# Patient Record
Sex: Female | Born: 1975 | Race: White | Hispanic: No | Marital: Married | Smoking: Never smoker
Health system: Southern US, Community
[De-identification: ages and names within clinical notes are randomized; demographics above are authoritative.]

## PROBLEM LIST (undated history)

## (undated) DIAGNOSIS — D649 Anemia, unspecified: Secondary | ICD-10-CM

## (undated) DIAGNOSIS — K829 Disease of gallbladder, unspecified: Secondary | ICD-10-CM

## (undated) DIAGNOSIS — R7989 Other specified abnormal findings of blood chemistry: Secondary | ICD-10-CM

## (undated) DIAGNOSIS — R12 Heartburn: Secondary | ICD-10-CM

## (undated) DIAGNOSIS — G08 Intracranial and intraspinal phlebitis and thrombophlebitis: Secondary | ICD-10-CM

## (undated) DIAGNOSIS — M7989 Other specified soft tissue disorders: Secondary | ICD-10-CM

## (undated) DIAGNOSIS — K3184 Gastroparesis: Secondary | ICD-10-CM

## (undated) DIAGNOSIS — Z1371 Encounter for nonprocreative screening for genetic disease carrier status: Secondary | ICD-10-CM

## (undated) DIAGNOSIS — R7303 Prediabetes: Secondary | ICD-10-CM

## (undated) DIAGNOSIS — E559 Vitamin D deficiency, unspecified: Secondary | ICD-10-CM

## (undated) DIAGNOSIS — I639 Cerebral infarction, unspecified: Secondary | ICD-10-CM

## (undated) DIAGNOSIS — K311 Adult hypertrophic pyloric stenosis: Secondary | ICD-10-CM

## (undated) DIAGNOSIS — K449 Diaphragmatic hernia without obstruction or gangrene: Secondary | ICD-10-CM

## (undated) DIAGNOSIS — K313 Pylorospasm, not elsewhere classified: Secondary | ICD-10-CM

## (undated) DIAGNOSIS — E282 Polycystic ovarian syndrome: Secondary | ICD-10-CM

## (undated) DIAGNOSIS — M2550 Pain in unspecified joint: Secondary | ICD-10-CM

## (undated) DIAGNOSIS — M1990 Unspecified osteoarthritis, unspecified site: Secondary | ICD-10-CM

## (undated) DIAGNOSIS — G4733 Obstructive sleep apnea (adult) (pediatric): Secondary | ICD-10-CM

## (undated) DIAGNOSIS — I10 Essential (primary) hypertension: Secondary | ICD-10-CM

## (undated) DIAGNOSIS — E785 Hyperlipidemia, unspecified: Secondary | ICD-10-CM

## (undated) DIAGNOSIS — G932 Benign intracranial hypertension: Secondary | ICD-10-CM

## (undated) DIAGNOSIS — Z86718 Personal history of other venous thrombosis and embolism: Secondary | ICD-10-CM

## (undated) HISTORY — DX: Heartburn: R12

## (undated) HISTORY — PX: RIGHT OOPHORECTOMY: SHX2359

## (undated) HISTORY — PX: TYMPANOSTOMY TUBE PLACEMENT: SHX32

## (undated) HISTORY — PX: SALPINGECTOMY: SHX328

## (undated) HISTORY — DX: Cerebral infarction, unspecified: I63.9

## (undated) HISTORY — DX: Vitamin D deficiency, unspecified: E55.9

## (undated) HISTORY — PX: WISDOM TOOTH EXTRACTION: SHX21

## (undated) HISTORY — DX: Hyperlipidemia, unspecified: E78.5

## (undated) HISTORY — DX: Pylorospasm, not elsewhere classified: K31.3

## (undated) HISTORY — DX: Benign intracranial hypertension: G93.2

## (undated) HISTORY — DX: Other specified abnormal findings of blood chemistry: R79.89

## (undated) HISTORY — DX: Disease of gallbladder, unspecified: K82.9

## (undated) HISTORY — DX: Adult hypertrophic pyloric stenosis: K31.1

## (undated) HISTORY — DX: Prediabetes: R73.03

## (undated) HISTORY — DX: Unspecified osteoarthritis, unspecified site: M19.90

## (undated) HISTORY — PX: PYLOROPLASTY: SHX418

## (undated) HISTORY — PX: APPENDECTOMY: SHX54

## (undated) HISTORY — DX: Personal history of other venous thrombosis and embolism: Z86.718

## (undated) HISTORY — DX: Anemia, unspecified: D64.9

## (undated) HISTORY — DX: Obstructive sleep apnea (adult) (pediatric): G47.33

## (undated) HISTORY — DX: Gastroparesis: K31.84

## (undated) HISTORY — DX: Pain in unspecified joint: M25.50

## (undated) HISTORY — PX: KNEE ARTHROSCOPY: SHX127

## (undated) HISTORY — DX: Essential (primary) hypertension: I10

## (undated) HISTORY — PX: COLON SURGERY: SHX602

## (undated) HISTORY — DX: Other specified soft tissue disorders: M79.89

## (undated) HISTORY — DX: Diaphragmatic hernia without obstruction or gangrene: K44.9

## (undated) HISTORY — PX: BREAST BIOPSY: SHX20

---

## 1999-09-25 ENCOUNTER — Other Ambulatory Visit: Admission: RE | Admit: 1999-09-25 | Discharge: 1999-09-25 | Payer: Self-pay | Admitting: Obstetrics and Gynecology

## 2000-04-26 DIAGNOSIS — G08 Intracranial and intraspinal phlebitis and thrombophlebitis: Secondary | ICD-10-CM

## 2000-04-26 DIAGNOSIS — I639 Cerebral infarction, unspecified: Secondary | ICD-10-CM

## 2000-04-26 HISTORY — DX: Intracranial and intraspinal phlebitis and thrombophlebitis: G08

## 2000-04-26 HISTORY — DX: Cerebral infarction, unspecified: I63.9

## 2000-10-19 ENCOUNTER — Other Ambulatory Visit: Admission: RE | Admit: 2000-10-19 | Discharge: 2000-10-19 | Payer: Self-pay | Admitting: Obstetrics and Gynecology

## 2001-06-25 ENCOUNTER — Inpatient Hospital Stay (HOSPITAL_COMMUNITY): Admission: EM | Admit: 2001-06-25 | Discharge: 2001-07-05 | Payer: Self-pay | Admitting: Neurosurgery

## 2001-06-25 ENCOUNTER — Encounter: Payer: Self-pay | Admitting: Neurosurgery

## 2001-06-25 ENCOUNTER — Encounter: Payer: Self-pay | Admitting: Emergency Medicine

## 2001-06-26 ENCOUNTER — Encounter: Payer: Self-pay | Admitting: Neurosurgery

## 2001-06-27 ENCOUNTER — Encounter: Payer: Self-pay | Admitting: Neurosurgery

## 2001-06-29 ENCOUNTER — Encounter: Payer: Self-pay | Admitting: Neurosurgery

## 2001-07-03 ENCOUNTER — Encounter: Payer: Self-pay | Admitting: Neurosurgery

## 2001-07-06 ENCOUNTER — Emergency Department (HOSPITAL_COMMUNITY): Admission: EM | Admit: 2001-07-06 | Discharge: 2001-07-06 | Payer: Self-pay | Admitting: *Deleted

## 2001-07-06 ENCOUNTER — Encounter: Payer: Self-pay | Admitting: Neurosurgery

## 2001-10-20 ENCOUNTER — Other Ambulatory Visit: Admission: RE | Admit: 2001-10-20 | Discharge: 2001-10-20 | Payer: Self-pay | Admitting: Obstetrics and Gynecology

## 2001-10-22 ENCOUNTER — Inpatient Hospital Stay (HOSPITAL_COMMUNITY): Admission: AD | Admit: 2001-10-22 | Discharge: 2001-10-22 | Payer: Self-pay | Admitting: Obstetrics and Gynecology

## 2001-10-23 ENCOUNTER — Inpatient Hospital Stay (HOSPITAL_COMMUNITY): Admission: AD | Admit: 2001-10-23 | Discharge: 2001-10-23 | Payer: Self-pay | Admitting: Obstetrics and Gynecology

## 2001-11-10 ENCOUNTER — Encounter: Payer: Self-pay | Admitting: Neurosurgery

## 2001-11-11 ENCOUNTER — Ambulatory Visit (HOSPITAL_COMMUNITY): Admission: RE | Admit: 2001-11-11 | Discharge: 2001-11-11 | Payer: Self-pay | Admitting: Neurosurgery

## 2002-05-03 ENCOUNTER — Emergency Department (HOSPITAL_COMMUNITY): Admission: EM | Admit: 2002-05-03 | Discharge: 2002-05-04 | Payer: Self-pay | Admitting: Emergency Medicine

## 2002-09-05 ENCOUNTER — Ambulatory Visit (HOSPITAL_COMMUNITY): Admission: RE | Admit: 2002-09-05 | Discharge: 2002-09-05 | Payer: Self-pay | Admitting: Oncology

## 2002-09-05 ENCOUNTER — Encounter: Payer: Self-pay | Admitting: Oncology

## 2002-10-24 ENCOUNTER — Other Ambulatory Visit: Admission: RE | Admit: 2002-10-24 | Discharge: 2002-10-24 | Payer: Self-pay | Admitting: Obstetrics and Gynecology

## 2003-06-26 ENCOUNTER — Emergency Department (HOSPITAL_COMMUNITY): Admission: EM | Admit: 2003-06-26 | Discharge: 2003-06-27 | Payer: Self-pay | Admitting: Emergency Medicine

## 2003-10-31 ENCOUNTER — Other Ambulatory Visit: Admission: RE | Admit: 2003-10-31 | Discharge: 2003-10-31 | Payer: Self-pay | Admitting: Obstetrics and Gynecology

## 2004-03-17 ENCOUNTER — Ambulatory Visit: Payer: Self-pay | Admitting: Oncology

## 2004-05-25 ENCOUNTER — Ambulatory Visit: Payer: Self-pay | Admitting: Oncology

## 2004-07-23 ENCOUNTER — Other Ambulatory Visit: Admission: RE | Admit: 2004-07-23 | Discharge: 2004-07-23 | Payer: Self-pay | Admitting: Obstetrics and Gynecology

## 2004-07-27 ENCOUNTER — Ambulatory Visit: Payer: Self-pay | Admitting: Oncology

## 2004-09-14 ENCOUNTER — Ambulatory Visit: Payer: Self-pay | Admitting: Oncology

## 2004-11-09 ENCOUNTER — Ambulatory Visit: Payer: Self-pay | Admitting: Oncology

## 2004-12-07 ENCOUNTER — Ambulatory Visit: Admission: RE | Admit: 2004-12-07 | Discharge: 2004-12-07 | Payer: Self-pay | Admitting: Oncology

## 2004-12-08 ENCOUNTER — Inpatient Hospital Stay (HOSPITAL_COMMUNITY): Admission: AD | Admit: 2004-12-08 | Discharge: 2004-12-08 | Payer: Self-pay | Admitting: Obstetrics and Gynecology

## 2004-12-09 ENCOUNTER — Inpatient Hospital Stay (HOSPITAL_COMMUNITY): Admission: AD | Admit: 2004-12-09 | Discharge: 2004-12-09 | Payer: Self-pay | Admitting: Obstetrics and Gynecology

## 2004-12-18 ENCOUNTER — Inpatient Hospital Stay (HOSPITAL_COMMUNITY): Admission: AD | Admit: 2004-12-18 | Discharge: 2004-12-19 | Payer: Self-pay | Admitting: Obstetrics and Gynecology

## 2004-12-21 ENCOUNTER — Inpatient Hospital Stay (HOSPITAL_COMMUNITY): Admission: AD | Admit: 2004-12-21 | Discharge: 2004-12-21 | Payer: Self-pay | Admitting: Obstetrics and Gynecology

## 2004-12-24 ENCOUNTER — Inpatient Hospital Stay (HOSPITAL_COMMUNITY): Admission: AD | Admit: 2004-12-24 | Discharge: 2004-12-24 | Payer: Self-pay | Admitting: Obstetrics and Gynecology

## 2004-12-31 ENCOUNTER — Inpatient Hospital Stay (HOSPITAL_COMMUNITY): Admission: AD | Admit: 2004-12-31 | Discharge: 2005-01-01 | Payer: Self-pay | Admitting: Obstetrics and Gynecology

## 2005-01-01 ENCOUNTER — Inpatient Hospital Stay (HOSPITAL_COMMUNITY): Admission: AD | Admit: 2005-01-01 | Discharge: 2005-01-01 | Payer: Self-pay | Admitting: Obstetrics and Gynecology

## 2005-01-04 ENCOUNTER — Inpatient Hospital Stay (HOSPITAL_COMMUNITY): Admission: AD | Admit: 2005-01-04 | Discharge: 2005-01-06 | Payer: Self-pay | Admitting: Obstetrics and Gynecology

## 2005-01-10 ENCOUNTER — Inpatient Hospital Stay (HOSPITAL_COMMUNITY): Admission: AD | Admit: 2005-01-10 | Discharge: 2005-01-11 | Payer: Self-pay | Admitting: Obstetrics and Gynecology

## 2005-01-12 ENCOUNTER — Inpatient Hospital Stay (HOSPITAL_COMMUNITY): Admission: AD | Admit: 2005-01-12 | Discharge: 2005-01-15 | Payer: Self-pay | Admitting: Obstetrics and Gynecology

## 2005-01-18 ENCOUNTER — Ambulatory Visit: Payer: Self-pay | Admitting: Oncology

## 2005-01-24 ENCOUNTER — Inpatient Hospital Stay (HOSPITAL_COMMUNITY): Admission: AD | Admit: 2005-01-24 | Discharge: 2005-01-25 | Payer: Self-pay | Admitting: Obstetrics & Gynecology

## 2005-01-28 ENCOUNTER — Inpatient Hospital Stay (HOSPITAL_COMMUNITY): Admission: AD | Admit: 2005-01-28 | Discharge: 2005-01-31 | Payer: Self-pay | Admitting: Obstetrics and Gynecology

## 2005-03-10 ENCOUNTER — Ambulatory Visit: Payer: Self-pay | Admitting: Oncology

## 2005-03-12 ENCOUNTER — Other Ambulatory Visit: Admission: RE | Admit: 2005-03-12 | Discharge: 2005-03-12 | Payer: Self-pay | Admitting: Obstetrics and Gynecology

## 2008-01-24 ENCOUNTER — Encounter: Admission: RE | Admit: 2008-01-24 | Discharge: 2008-03-25 | Payer: Self-pay | Admitting: Family Medicine

## 2008-02-13 ENCOUNTER — Emergency Department (HOSPITAL_BASED_OUTPATIENT_CLINIC_OR_DEPARTMENT_OTHER): Admission: EM | Admit: 2008-02-13 | Discharge: 2008-02-13 | Payer: Self-pay | Admitting: Emergency Medicine

## 2008-04-05 ENCOUNTER — Encounter: Admission: RE | Admit: 2008-04-05 | Discharge: 2008-07-04 | Payer: Self-pay | Admitting: Family Medicine

## 2008-08-02 ENCOUNTER — Encounter: Admission: RE | Admit: 2008-08-02 | Discharge: 2008-10-31 | Payer: Self-pay | Admitting: Family Medicine

## 2008-11-04 ENCOUNTER — Encounter: Admission: RE | Admit: 2008-11-04 | Discharge: 2008-11-04 | Payer: Self-pay | Admitting: Family Medicine

## 2009-08-23 ENCOUNTER — Emergency Department (HOSPITAL_BASED_OUTPATIENT_CLINIC_OR_DEPARTMENT_OTHER): Admission: EM | Admit: 2009-08-23 | Discharge: 2009-08-23 | Payer: Self-pay | Admitting: Emergency Medicine

## 2010-05-16 ENCOUNTER — Encounter: Payer: Self-pay | Admitting: Obstetrics and Gynecology

## 2010-09-11 NOTE — H&P (Signed)
Shelley Ryan, Shelley Ryan                 ACCOUNT NO.:  [id number]   MEDICAL RECORD NO.:  [id number]          PATIENT TYPE:  INP   LOCATION:  9152                          FACILITY:  [facility]   PHYSICIAN:  Duke Salvia. Marcelle Overlie, M.D.DATE OF BIRTH:  1975-05-28   DATE OF ADMISSION:  01/12/2005  DATE OF DISCHARGE:                                HISTORY & PHYSICAL   CHIEF COMPLAINT:  Preterm labor.   HISTORY OF PRESENT ILLNESS:  She is a 35 year old G1, P0, EDD is October 23.  EGA is 35+ weeks but was noted on routine exam today in office to have  increased irritability.  She has had several episodes of admissions for PTL  but had declined terbutaline and magnesium.  She has currently been managed  as an outpatient with Procardia 20 mg p.o. either t.i.d. or q.i.d.  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 Kalman Drape  management with this pregnancy.  Decision made today to admit for  observation, hydration, continue her Procardia, allow the Lovenox to wear  off but consider amniocentesis for lung maturity followed by induction if  mature.   PAST MEDICAL HISTORY:  Blood type is A positive.  Rubella titer is immune.   ALLERGIES:  Numerous drug allergies as noted on her prenatal record.   PAST MEDICAL HISTORY:  1.  Significant for the above noted elevated factor 8.  2.  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   PAST SURGICAL HISTORY:  1.  She has had knee surgery.  2.  Appendectomy.  3.  History of VDS that will require SPE prophylaxis.   PHYSICAL EXAMINATION:  VITAL SIGNS:  She is afebrile, blood pressure 141/81.  HEENT:  Unremarkable.  NECK:  Supple without masses.  LUNGS:  Clear.  CARDIOVASCULAR:  Regular rate and rhythm without murmurs, rubs, or gallops.  BREASTS:  Not examined.  ABDOMEN:  36 cm fundal height.  Fetal heart rate 140  PELVIC:  Cervix was closed.  EXTREMITIES/NEUROLOGIC:  Unremarkable.   IMPRESSION:  1.  A 35-week intrauterine pregnancy.  2.  Mild preterm labor.  3.  History of factor 8 elevation.   PLAN:  Will admit for hydration.  Continue her Procardia.  Will allow 24-36  hours for her Lovenox to wear off and then schedule amniocentesis for lung  maturity followed by induction if mature.      Richard M. Marcelle Overlie, M.D.  Electronically Signed     RMH/MEDQ  D:  01/12/2005  T:  01/12/2005  Job:  [id number]

## 2010-09-11 NOTE — Discharge Summary (Signed)
Shelley Ryan, Shelley Ryan                 ACCOUNT NO.:  [id number]   MEDICAL RECORD NO.:  [id number]          PATIENT TYPE:  INP   LOCATION:  9156                          [facility]ILITY:  [facility]   PHYSICIAN:  Zelphia Cairo, MD    DATE OF BIRTH:  09/12/1975   DATE OF ADMISSION:  01/04/2005  DATE OF DISCHARGE:  01/06/2005                                 DISCHARGE SUMMARY   ADMITTING DIAGNOSES:  1.  Intrauterine pregnancy at 34-1/7th's weeks' estimated gestational age.  2.  Preterm labor.  3.  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  4.  History of cerebrovascular accident.  5.  __________  .   DISCHARGE DIAGNOSIS:  1.  Intrauterine pregnancy at 34-3/7 weeks' estimated gestational age.  2.  Preterm labor, arrested.  3.  Thrombophilia.   REASON FOR ADMISSION:  Please see written H&P.   HOSPITAL COURSE:  The patient is a 35 year old, white, married female  primigravida that was admitted to [facility] at 34-1/7th's  weeks' estimated gestational age with complaints of increasing uterine  irritability.  Pregnancy had been complicated by thrombophilia, currently on  Lovenox.  She had had a history of a cerebrovascular accident.  The patient  was currently on Procardia t.i.d.  The patient denied any leakage of  amniotic fluid or vaginal bleeding.  She also denied any headaches, CNS  symptoms, or epigastric pain. On admission, abdomen was soft without  epigastric tenderness. Cervix was noted to be closed and thick by exam her  admitting nurse.  Deep tendon reflexes 2+ without clonus.  Fetal heart tones  were reactive.  Uterine contractions were noted to be irregular.  The  patient was admitted for observation, oral fluids, bedrest, and continued  Procardia and Lovenox.  On the following morning with continued uterine  irritability was noted.  Vital signs were otherwise stable.  Fetal heart  tones in the 140s and cervix was examined and noted to be long and closed.  Ultrasound  was ordered with plans if stable possible discharge the following  morning.  On the following morning, the patient had had 4 to 5 contractions  over night.  She continued to deny vaginal bleeding or loss of amniotic  fluid.  The fetus was noted to have good fetal movement with good  variability.  Discussed with the patient regarding monitoring at home and  continue modified bedrest.  The patient was later discharge.   CONDITION ON DISCHARGE:  Stable.   DIET:  Regular as tolerated.   ACTIVITY:  Modified bedrest   FOLLOW UP:  Patient is to follow up in the office in one week for an OB  check.   She is to call for increase in uterine contractions, decreased in fetal  movement, loss of amniotic fluid, or vaginal bleeding.   DISCHARGE MEDICATIONS:  1.  Continue Lovenox 40 mg subcu q.h.s.  2.  Procardia 20 mg t.i.d.  3.  Prenatal vitamins one p.o. daily.      Julio Sicks, N.P.      Zelphia Cairo, MD  Electronically Signed    CC/MEDQ  D:  04/06/2005  T:  04/06/2005  Job:  [id number]

## 2011-05-24 ENCOUNTER — Other Ambulatory Visit: Payer: Self-pay | Admitting: Obstetrics and Gynecology

## 2011-05-24 ENCOUNTER — Ambulatory Visit
Admission: RE | Admit: 2011-05-24 | Discharge: 2011-05-24 | Disposition: A | Discharge status: Home / Self Care | Payer: BC Managed Care – PPO | Source: Ambulatory Visit | Attending: Obstetrics and Gynecology | Admitting: Obstetrics and Gynecology

## 2011-05-24 DIAGNOSIS — N6315 Unspecified lump in the right breast, overlapping quadrants: Secondary | ICD-10-CM

## 2011-05-28 ENCOUNTER — Telehealth: Payer: Self-pay | Admitting: *Deleted

## 2011-05-28 NOTE — Telephone Encounter (Signed)
Confirmed 05/31/11 genetics appt w/ pt. 

## 2011-05-31 ENCOUNTER — Ambulatory Visit: Payer: BC Managed Care – PPO

## 2011-05-31 NOTE — Progress Notes (Signed)
Patient seen for genetic counseling. Blood drawn for BRCA1/2 at [facility]. TAT 2 weeks. Offered patient referral for heme workup and/or adult genetic work-up due to a history of a stroke in her 83s. Patient declined at this time.

## 2011-06-14 ENCOUNTER — Telehealth: Payer: Self-pay | Admitting: Genetic Counselor

## 2011-07-14 ENCOUNTER — Encounter: Payer: Self-pay | Admitting: Oncology

## 2013-08-17 NOTE — Telephone Encounter (Signed)
Please see Visit Info comments 

## 2013-10-08 ENCOUNTER — Inpatient Hospital Stay (HOSPITAL_BASED_OUTPATIENT_CLINIC_OR_DEPARTMENT_OTHER)
Admission: EM | Admit: 2013-10-08 | Discharge: 2013-10-10 | DRG: 419 | Disposition: A | Discharge status: Home / Self Care | Payer: BC Managed Care – PPO | Attending: General Surgery | Admitting: General Surgery

## 2013-10-08 ENCOUNTER — Emergency Department (HOSPITAL_BASED_OUTPATIENT_CLINIC_OR_DEPARTMENT_OTHER): Payer: BC Managed Care – PPO

## 2013-10-08 ENCOUNTER — Encounter (HOSPITAL_BASED_OUTPATIENT_CLINIC_OR_DEPARTMENT_OTHER): Payer: Self-pay | Admitting: Emergency Medicine

## 2013-10-08 DIAGNOSIS — Z8673 Personal history of transient ischemic attack (TIA), and cerebral infarction without residual deficits: Secondary | ICD-10-CM

## 2013-10-08 DIAGNOSIS — Z885 Allergy status to narcotic agent status: Secondary | ICD-10-CM

## 2013-10-08 DIAGNOSIS — R1011 Right upper quadrant pain: Secondary | ICD-10-CM

## 2013-10-08 DIAGNOSIS — R609 Edema, unspecified: Secondary | ICD-10-CM | POA: Diagnosis present

## 2013-10-08 DIAGNOSIS — Z9049 Acquired absence of other specified parts of digestive tract: Secondary | ICD-10-CM

## 2013-10-08 DIAGNOSIS — E669 Obesity, unspecified: Secondary | ICD-10-CM | POA: Diagnosis present

## 2013-10-08 DIAGNOSIS — Z881 Allergy status to other antibiotic agents status: Secondary | ICD-10-CM

## 2013-10-08 DIAGNOSIS — Z6839 Body mass index (BMI) 39.0-39.9, adult: Secondary | ICD-10-CM

## 2013-10-08 DIAGNOSIS — R112 Nausea with vomiting, unspecified: Secondary | ICD-10-CM

## 2013-10-08 DIAGNOSIS — Z803 Family history of malignant neoplasm of breast: Secondary | ICD-10-CM

## 2013-10-08 DIAGNOSIS — K810 Acute cholecystitis: Secondary | ICD-10-CM

## 2013-10-08 DIAGNOSIS — Z6834 Body mass index (BMI) 34.0-34.9, adult: Secondary | ICD-10-CM

## 2013-10-08 DIAGNOSIS — E282 Polycystic ovarian syndrome: Secondary | ICD-10-CM | POA: Diagnosis present

## 2013-10-08 DIAGNOSIS — Z1371 Encounter for nonprocreative screening for genetic disease carrier status: Secondary | ICD-10-CM

## 2013-10-08 DIAGNOSIS — Z6833 Body mass index (BMI) 33.0-33.9, adult: Secondary | ICD-10-CM

## 2013-10-08 DIAGNOSIS — Z79899 Other long term (current) drug therapy: Secondary | ICD-10-CM

## 2013-10-08 DIAGNOSIS — K8000 Calculus of gallbladder with acute cholecystitis without obstruction: Secondary | ICD-10-CM | POA: Diagnosis present

## 2013-10-08 DIAGNOSIS — D72829 Elevated white blood cell count, unspecified: Secondary | ICD-10-CM | POA: Diagnosis present

## 2013-10-08 DIAGNOSIS — Z888 Allergy status to other drugs, medicaments and biological substances status: Secondary | ICD-10-CM

## 2013-10-08 HISTORY — DX: Polycystic ovarian syndrome: E28.2

## 2013-10-08 HISTORY — DX: Cerebral infarction, unspecified: I63.9

## 2013-10-08 HISTORY — DX: Intracranial and intraspinal phlebitis and thrombophlebitis: G08

## 2013-10-08 HISTORY — DX: Encounter for nonprocreative screening for genetic disease carrier status: Z13.71

## 2013-10-08 LAB — COMPREHENSIVE METABOLIC PANEL
ALT: 18 U/L (ref 0–35)
AST: 24 U/L (ref 0–37)
Albumin: 4.1 g/dL (ref 3.5–5.2)
Alkaline Phosphatase: 64 U/L (ref 39–117)
BILIRUBIN TOTAL: 0.6 mg/dL (ref 0.3–1.2)
BUN: 15 mg/dL (ref 6–23)
CO2: 22 mEq/L (ref 19–32)
CREATININE: 0.8 mg/dL (ref 0.50–1.10)
Calcium: 9.5 mg/dL (ref 8.4–10.5)
Chloride: 102 mEq/L (ref 96–112)
Glucose, Bld: 97 mg/dL (ref 70–99)
Potassium: 3.9 mEq/L (ref 3.7–5.3)
Sodium: 138 mEq/L (ref 137–147)
Total Protein: 7.5 g/dL (ref 6.0–8.3)

## 2013-10-08 LAB — CBC WITH DIFFERENTIAL/PLATELET
BASOS PCT: 0 % (ref 0–1)
Basophils Absolute: 0 10*3/uL (ref 0.0–0.1)
EOS ABS: 0 10*3/uL (ref 0.0–0.7)
Eosinophils Relative: 0 % (ref 0–5)
HCT: 40.1 % (ref 36.0–46.0)
HEMOGLOBIN: 13.8 g/dL (ref 12.0–15.0)
Lymphocytes Relative: 12 % (ref 12–46)
Lymphs Abs: 1.7 10*3/uL (ref 0.7–4.0)
MCH: 30.5 pg (ref 26.0–34.0)
MCHC: 34.4 g/dL (ref 30.0–36.0)
MCV: 88.7 fL (ref 78.0–100.0)
MONOS PCT: 5 % (ref 3–12)
Monocytes Absolute: 0.8 10*3/uL (ref 0.1–1.0)
NEUTROS ABS: 11.9 10*3/uL — AB (ref 1.7–7.7)
Neutrophils Relative %: 83 % — ABNORMAL HIGH (ref 43–77)
Platelets: 224 10*3/uL (ref 150–400)
RBC: 4.52 MIL/uL (ref 3.87–5.11)
RDW: 12.6 % (ref 11.5–15.5)
WBC: 14.4 10*3/uL — ABNORMAL HIGH (ref 4.0–10.5)

## 2013-10-08 LAB — URINALYSIS, ROUTINE W REFLEX MICROSCOPIC
Bilirubin Urine: NEGATIVE
Glucose, UA: NEGATIVE mg/dL
HGB URINE DIPSTICK: NEGATIVE
KETONES UR: NEGATIVE mg/dL
Leukocytes, UA: NEGATIVE
Nitrite: NEGATIVE
PROTEIN: NEGATIVE mg/dL
Specific Gravity, Urine: 1.009 (ref 1.005–1.030)
Urobilinogen, UA: 0.2 mg/dL (ref 0.0–1.0)
pH: 7 (ref 5.0–8.0)

## 2013-10-08 LAB — PREGNANCY, URINE: Preg Test, Ur: NEGATIVE

## 2013-10-08 LAB — LIPASE, BLOOD: LIPASE: 21 U/L (ref 11–59)

## 2013-10-08 MED ORDER — CEFTRIAXONE SODIUM 2 G IJ SOLR
2.0000 g | INTRAMUSCULAR | Status: DC
  Administered 2013-10-09: 2 g via INTRAVENOUS
  Filled 2013-10-08 (×2): qty 2

## 2013-10-08 MED ORDER — KCL IN DEXTROSE-NACL 40-5-0.45 MEQ/L-%-% IV SOLN
INTRAVENOUS | Status: DC
  Administered 2013-10-09 (×2): via INTRAVENOUS
  Filled 2013-10-08 (×3): qty 1000

## 2013-10-08 MED ORDER — ONDANSETRON HCL 4 MG/2ML IJ SOLN: 4.0000 mg | Freq: Four times a day (QID) | INTRAMUSCULAR | Status: DC | PRN

## 2013-10-08 MED ORDER — DIPHENHYDRAMINE HCL 12.5 MG/5ML PO ELIX: 12.5000 mg | ORAL_SOLUTION | Freq: Four times a day (QID) | ORAL | Status: DC | PRN

## 2013-10-08 MED ORDER — BISACODYL 10 MG RE SUPP: 10.0000 mg | Freq: Two times a day (BID) | RECTAL | Status: DC | PRN

## 2013-10-08 MED ORDER — LACTATED RINGERS IV BOLUS (SEPSIS): 1000.0000 mL | Freq: Three times a day (TID) | INTRAVENOUS | Status: DC | PRN

## 2013-10-08 MED ORDER — GI COCKTAIL ~~LOC~~
30.0000 mL | Freq: Once | ORAL | Status: AC
  Administered 2013-10-08: 30 mL via ORAL
  Filled 2013-10-08: qty 30

## 2013-10-08 MED ORDER — METRONIDAZOLE IN NACL 5-0.79 MG/ML-% IV SOLN
500.0000 mg | Freq: Four times a day (QID) | INTRAVENOUS | Status: DC
Start: 2013-10-08 — End: 2013-10-09
  Administered 2013-10-09 (×3): 500 mg via INTRAVENOUS
  Filled 2013-10-08 (×5): qty 100

## 2013-10-08 MED ORDER — MAGIC MOUTHWASH
15.0000 mL | Freq: Four times a day (QID) | ORAL | Status: DC | PRN
  Filled 2013-10-08: qty 15

## 2013-10-08 MED ORDER — TRAMADOL HCL 50 MG PO TABS: 50.0000 mg | ORAL_TABLET | Freq: Four times a day (QID) | ORAL | Status: DC | PRN

## 2013-10-08 MED ORDER — PROMETHAZINE HCL 25 MG/ML IJ SOLN
6.2500 mg | Freq: Four times a day (QID) | INTRAMUSCULAR | Status: DC | PRN
  Filled 2013-10-08: qty 1

## 2013-10-08 MED ORDER — CHLORHEXIDINE GLUCONATE 4 % EX LIQD
1.0000 "application " | Freq: Once | CUTANEOUS | Status: AC
  Administered 2013-10-09: 1 via TOPICAL
  Filled 2013-10-08 (×2): qty 15

## 2013-10-08 MED ORDER — MENTHOL 3 MG MT LOZG
1.0000 | LOZENGE | OROMUCOSAL | Status: DC | PRN
  Filled 2013-10-08: qty 9

## 2013-10-08 MED ORDER — ONDANSETRON 8 MG/NS 50 ML IVPB
8.0000 mg | Freq: Four times a day (QID) | INTRAVENOUS | Status: DC | PRN
Start: 2013-10-08 — End: 2013-10-09
  Filled 2013-10-08: qty 8

## 2013-10-08 MED ORDER — ONDANSETRON 4 MG PO TBDP
4.0000 mg | ORAL_TABLET | Freq: Four times a day (QID) | ORAL | Status: DC | PRN
  Filled 2013-10-08: qty 2

## 2013-10-08 MED ORDER — DIPHENHYDRAMINE HCL 50 MG/ML IJ SOLN: 12.5000 mg | Freq: Four times a day (QID) | INTRAMUSCULAR | Status: DC | PRN

## 2013-10-08 MED ORDER — LACTATED RINGERS IV BOLUS (SEPSIS)
1000.0000 mL | Freq: Once | INTRAVENOUS | Status: AC
  Administered 2013-10-08: 1000 mL via INTRAVENOUS

## 2013-10-08 MED ORDER — SACCHAROMYCES BOULARDII 250 MG PO CAPS
250.0000 mg | ORAL_CAPSULE | Freq: Two times a day (BID) | ORAL | Status: DC
  Administered 2013-10-08: 250 mg via ORAL
  Filled 2013-10-08 (×3): qty 1

## 2013-10-08 MED ORDER — CHLORHEXIDINE GLUCONATE 4 % EX LIQD
1.0000 "application " | Freq: Once | CUTANEOUS | Status: AC
  Administered 2013-10-08: 1 via TOPICAL
  Filled 2013-10-08: qty 15

## 2013-10-08 MED ORDER — PSYLLIUM 95 % PO PACK
1.0000 | PACK | Freq: Two times a day (BID) | ORAL | Status: DC
  Administered 2013-10-08: 1 via ORAL
  Filled 2013-10-08 (×3): qty 1

## 2013-10-08 MED ORDER — ACETAMINOPHEN 325 MG PO TABS: 650.0000 mg | ORAL_TABLET | Freq: Four times a day (QID) | ORAL | Status: DC | PRN

## 2013-10-08 MED ORDER — ADULT MULTIVITAMIN W/MINERALS CH
1.0000 | ORAL_TABLET | Freq: Every day | ORAL | Status: DC
  Administered 2013-10-08: 1 via ORAL
  Filled 2013-10-08 (×2): qty 1

## 2013-10-08 MED ORDER — PANTOPRAZOLE SODIUM 40 MG PO TBEC
40.0000 mg | DELAYED_RELEASE_TABLET | Freq: Every day | ORAL | Status: DC
  Filled 2013-10-08: qty 1

## 2013-10-08 MED ORDER — ONDANSETRON HCL 4 MG/2ML IJ SOLN
4.0000 mg | Freq: Once | INTRAMUSCULAR | Status: AC
  Administered 2013-10-08: 4 mg via INTRAVENOUS
  Filled 2013-10-08: qty 2

## 2013-10-08 MED ORDER — ACETAMINOPHEN 650 MG RE SUPP: 650.0000 mg | Freq: Four times a day (QID) | RECTAL | Status: DC | PRN

## 2013-10-08 MED ORDER — ALUM & MAG HYDROXIDE-SIMETH 200-200-20 MG/5ML PO SUSP: 30.0000 mL | Freq: Four times a day (QID) | ORAL | Status: DC | PRN

## 2013-10-08 MED ORDER — HEPARIN SODIUM (PORCINE) 5000 UNIT/ML IJ SOLN
5000.0000 [IU] | Freq: Three times a day (TID) | INTRAMUSCULAR | Status: DC
  Filled 2013-10-08 (×5): qty 1

## 2013-10-08 MED ORDER — LIP MEDEX EX OINT
1.0000 "application " | TOPICAL_OINTMENT | Freq: Two times a day (BID) | CUTANEOUS | Status: DC
  Administered 2013-10-08: 1 via TOPICAL
  Filled 2013-10-08: qty 7

## 2013-10-08 MED ORDER — PHENOL 1.4 % MT LIQD
2.0000 | OROMUCOSAL | Status: DC | PRN
  Filled 2013-10-08: qty 177

## 2013-10-08 MED ORDER — MORPHINE SULFATE 4 MG/ML IJ SOLN
4.0000 mg | Freq: Once | INTRAMUSCULAR | Status: AC
  Administered 2013-10-08: 4 mg via INTRAVENOUS
  Filled 2013-10-08: qty 1

## 2013-10-08 MED ORDER — ONDANSETRON HCL 4 MG PO TABS: 4.0000 mg | ORAL_TABLET | Freq: Four times a day (QID) | ORAL | Status: DC | PRN

## 2013-10-08 MED ORDER — HYDROMORPHONE HCL PF 1 MG/ML IJ SOLN: 0.5000 mg | INTRAMUSCULAR | Status: DC | PRN

## 2013-10-08 NOTE — ED Notes (Signed)
Bed: WA26 Expected date:  Expected time:  Means of arrival:  Comments: TCU 

## 2013-10-08 NOTE — ED Provider Notes (Signed)
7:46 PM  Pt is a 38 y.o. F with history of prior CVA who presents the emergency department with right upper quadrant abdominal pain. Ultrasound showed acute cholecystitis. Patient sent here to see surgery. She states she is comfortable only complaining of nausea at this time. Her pain is a 3/10. Surgery has been page. Patient is n.p.o. Hemodynamically stable.  Layla MawKristen N Hisako Bugh, DO 10/08/13 1947

## 2013-10-08 NOTE — H&P (Signed)
[place], MD, [address] [place], [place] [address] Phone: [phone] FAX: [place]  03-27-1976 [id number]  CARE TEAM:  PCP: Tawanna Solo, MD  Outpatient Care Team: Patient Care Team: [name], MD as PCP - General (Family Medicine)  Inpatient Treatment Team: Treatment Team: Attending Provider: Nolon Nations, MD; Registered Nurse: Susette Racer, RN; Technician: Sharma Covert, EMT; Consulting Physician: Nolon Nations, MD; Registered Nurse: Roosvelt Harps, RN  This patient is a 38 y.o.female who presents today for surgical evaluation at the request of Thayer Jew, [facility] ED.   Reason for evaluation: RUQ pain, N/V, probable cholecystitis  Pleasant obese active female.  She comes today with her husband.  History of mild reflux in the past usually controlled with omeprazole.  Lives in [place] but was down in [place] for the weekend.  Awoke last night with severe upper abdominal pain.  Primarily right-sided.  It intensified with worsening nausea and vomiting.  She has never had anything like this before.  Did not improve with over-the-counter medications.  Went to Urgent care Center in [place].  Suspicion of gallbladder etiology.  Patient had worsening pain on ride home.  Came to [facility] emergency department.  Concern for cholecystitis.  Transfer to was [facility] emergency department for surgical consultation.  The pain is primarily in the right upper abdomen.  Radiates to the back.  Severe nausea and vomiting.  Improve with narcotic IV pain medication but not resolved.  This does not seem like heartburn or reflux.  No personal nor family history of GI/colon cancer, inflammatory bowel disease, irritable bowel syndrome, allergy such as Celiac Sprue, dietary/dairy problems, colitis, ulcers nor gastritis.  No recent sick contacts/gastroenteritis.  No travel outside  the country.  No changes in diet.  No dysphagia to solids or liquids.  No hematochezia, hematemesis, coffee ground emesis.  No evidence of prior gastric/peptic ulceration.  She only walks 5 miles a day.  Appendectomy but no other surgeries.  She does not smoke.  Had a stroke over 10 years ago.  Seems most likely due to hypercoagulable state on hormone therapy for polycystic ovarian disease.  No history of any strokes or other issues.  Claims normal hypercoagulable workup done  Past Medical History  Diagnosis Date  . CVA (cerebral infarction) 2002  . Dural sinus thrombosis 2002  . BRCA1 negative 10/08/2013    Past Surgical History  Procedure Laterality Date  . Appendectomy    . Knee arthroscopy Bilateral   . Wisdom tooth extraction    . Tympanostomy tube placement      x 2    History   Social History  . Marital Status: Married    Spouse Name: N/A    Number of Children: N/A  . Years of Education: N/A   Occupational History  . Not on file.   Social History Main Topics  . Smoking status: Never Smoker   . Smokeless tobacco: Never Used  . Alcohol Use: No  . Drug Use: No  . Sexual Activity: No   Other Topics Concern  . Not on file   Social History Narrative  . No narrative on file    Family History  Problem Relation Age of Onset  . Breast cancer      Current Facility-Administered Medications  Medication Dose Route Frequency Provider Last Rate Last Dose  . acetaminophen (TYLENOL)  tablet 650 mg  650 mg Oral Q6H PRN Adin Hector, MD       Or  . acetaminophen (TYLENOL) suppository 650 mg  650 mg Rectal Q6H PRN Adin Hector, MD      . alum & mag hydroxide-simeth (MAALOX/MYLANTA) 200-200-20 MG/5ML suspension 30 mL  30 mL Oral Q6H PRN Adin Hector, MD      . bisacodyl (DULCOLAX) suppository 10 mg  10 mg Rectal Q12H PRN Adin Hector, MD      . cefTRIAXone (ROCEPHIN) 2 g in dextrose 5 % 50 mL IVPB  2 g Intravenous Q24H Adin Hector, MD      . chlorhexidine  (HIBICLENS) 4 % liquid 1 application  1 application Topical Once Adin Hector, MD      . Derrill Memo ON 10/09/2013] chlorhexidine (HIBICLENS) 4 % liquid 1 application  1 application Topical Once Adin Hector, MD      . dextrose 5 % and 0.45 % NaCl with KCl 40 mEq/L infusion   Intravenous Continuous Adin Hector, MD      . diphenhydrAMINE (BENADRYL) injection 12.5-25 mg  12.5-25 mg Intravenous Q6H PRN Adin Hector, MD       Or  . diphenhydrAMINE (BENADRYL) 12.5 MG/5ML elixir 12.5-25 mg  12.5-25 mg Oral Q6H PRN Adin Hector, MD      . heparin injection 5,000 Units  5,000 Units Subcutaneous 3 times per day Adin Hector, MD      . HYDROmorphone (DILAUDID) injection 0.5-2 mg  0.5-2 mg Intravenous Q2H PRN Adin Hector, MD      . lactated ringers bolus 1,000 mL  1,000 mL Intravenous Once Adin Hector, MD      . lactated ringers bolus 1,000 mL  1,000 mL Intravenous Q8H PRN Adin Hector, MD      . lip balm (CARMEX) ointment 1 application  1 application Topical BID Adin Hector, MD      . magic mouthwash  15 mL Oral QID PRN Adin Hector, MD      . menthol-cetylpyridinium (CEPACOL) lozenge 3 mg  1 lozenge Oral PRN Adin Hector, MD      . metroNIDAZOLE (FLAGYL) IVPB 500 mg  500 mg Intravenous Q6H Adin Hector, MD      . multivitamin with minerals tablet 1 tablet  1 tablet Oral Daily Adin Hector, MD      . ondansetron (ZOFRAN) injection 4 mg  4 mg Intravenous Q6H PRN Adin Hector, MD       Or  . ondansetron (ZOFRAN) 8 mg/NS 50 ml IVPB  8 mg Intravenous Q6H PRN Adin Hector, MD      . ondansetron (ZOFRAN) tablet 4 mg  4 mg Oral Q6H PRN Adin Hector, MD      . ondansetron (ZOFRAN-ODT) disintegrating tablet 4-8 mg  4-8 mg Oral Q6H PRN Adin Hector, MD      . pantoprazole (PROTONIX) EC tablet 40 mg  40 mg Oral Q1200 Adin Hector, MD      . phenol (CHLORASEPTIC) mouth spray 2 spray  2 spray Mouth/Throat PRN Adin Hector, MD      . promethazine (PHENERGAN)  injection 6.25-25 mg  6.25-25 mg Intravenous Q6H PRN Adin Hector, MD      . psyllium (HYDROCIL/METAMUCIL) packet 1 packet  1 packet Oral BID Adin Hector, MD      . saccharomyces boulardii (FLORASTOR) capsule 250  mg  250 mg Oral BID Adin Hector, MD      . traMADol Veatrice Bourbon) tablet 50-100 mg  50-100 mg Oral Q6H PRN Adin Hector, MD       Current Outpatient Prescriptions  Medication Sig Dispense Refill  . cholecalciferol (VITAMIN D) 1000 UNITS tablet Take 1,000 Units by mouth daily.      . Multiple Vitamins-Minerals (MULTIVITAMIN WITH MINERALS) tablet Take 1 tablet by mouth daily.      Marland Kitchen omeprazole (PRILOSEC) 40 MG capsule Take 40 mg by mouth daily.      . ondansetron (ZOFRAN-ODT) 4 MG disintegrating tablet Take 4 mg by mouth every 8 (eight) hours as needed for nausea or vomiting.      . traMADol (ULTRAM) 50 MG tablet Take by mouth every 6 (six) hours as needed (pain).          Allergies  Allergen Reactions  . Biaxin [Clarithromycin]     Acute renal failure  . Keflex [Cephalexin] Nausea And Vomiting    Tolerates rocephin fine  . Reglan [Metoclopramide] Other (See Comments)    "doesn't act like her self"  . Tetracyclines & Related Other (See Comments)    Migraine   . Unasyn [Ampicillin-Sulbactam Sodium] Nausea And Vomiting  . Ciprofloxacin Rash  . Codeine Rash    Tolerates tramadol fine    ROS: Constitutional:  No fevers, chills, sweats.  Weight stable Eyes:  No vision changes, No discharge HENT:  No sore throats, nasal drainage Lymph: No neck swelling, No bruising easily Pulmonary:  No cough, productive sputum CV: No orthopnea, PND  Patient walks 3 hours minutes for about 7 miles without difficulty.  No exertional chest/neck/shoulder/arm pain. GI: No personal nor family history of GI/colon cancer, inflammatory bowel disease, irritable bowel syndrome, allergy such as Celiac Sprue, dietary/dairy problems, colitis, ulcers nor gastritis.  No recent sick  contacts/gastroenteritis.  No travel outside the country.  No changes in diet. Renal: No UTIs, No hematuria Genital:  No drainage, bleeding, masses Musculoskeletal: No severe joint pain.  Good ROM major joints Skin:  No sores or lesions.  No rashes Heme/Lymph:  No easy bleeding.  No swollen lymph nodes Neuro: No focal weakness/numbness.  No seizures Psych: No suicidal ideation.  No hallucinations  BP 119/67  Pulse 64  Temp(Src) 98.1 F (36.7 C) (Oral)  Resp 18  Ht 5' 7" (1.702 m)  Wt 218 lb (98.884 kg)  BMI 34.14 kg/m2  SpO2 100%  LMP 09/24/2013  Physical Exam: General: Pt awake/alert/oriented x4 in no major acute distress Eyes: PERRL, normal EOM. Sclera nonicteric Neuro: CN II-XII intact w/o focal sensory/motor deficits. Lymph: No head/neck/groin lymphadenopathy Psych:  No delerium/psychosis/paranoia HENT: Normocephalic, Mucus membranes moist.  No thrush Neck: Supple, No tracheal deviation Chest: No pain.  Good respiratory excursion. CV:  Pulses intact.  Regular rhythm Abdomen: Soft, Obese.  Nondistended.  Mod TTP RUQ/epigastric region with Murphy's sign.  No incarcerated hernias. Ext:  SCDs BLE.  No significant edema.  No cyanosis Skin: No petechiae / purpurea.  No major sores Musculoskeletal: No severe joint pain.  Good ROM major joints   Results:   Labs: Results for orders placed during the hospital encounter of 10/08/13 (from the past 48 hour(s))  URINALYSIS, ROUTINE W REFLEX MICROSCOPIC     Status: None   Collection Time    10/08/13  3:10 PM      Result Value Ref Range   Color, Urine YELLOW  YELLOW   APPearance CLEAR  CLEAR  Specific Gravity, Urine 1.009  1.005 - 1.030   pH 7.0  5.0 - 8.0   Glucose, UA NEGATIVE  NEGATIVE mg/dL   Hgb urine dipstick NEGATIVE  NEGATIVE   Bilirubin Urine NEGATIVE  NEGATIVE   Ketones, ur NEGATIVE  NEGATIVE mg/dL   Protein, ur NEGATIVE  NEGATIVE mg/dL   Urobilinogen, UA 0.2  0.0 - 1.0 mg/dL   Nitrite NEGATIVE  NEGATIVE    Leukocytes, UA NEGATIVE  NEGATIVE   Comment: MICROSCOPIC NOT DONE ON URINES WITH NEGATIVE PROTEIN, BLOOD, LEUKOCYTES, NITRITE, OR GLUCOSE <1000 mg/dL.  PREGNANCY, URINE     Status: None   Collection Time    10/08/13  3:10 PM      Result Value Ref Range   Preg Test, Ur NEGATIVE  NEGATIVE   Comment:            THE SENSITIVITY OF THIS     METHODOLOGY IS >20 mIU/mL.  CBC WITH DIFFERENTIAL     Status: Abnormal   Collection Time    10/08/13  4:45 PM      Result Value Ref Range   WBC 14.4 (*) 4.0 - 10.5 K/uL   RBC 4.52  3.87 - 5.11 MIL/uL   Hemoglobin 13.8  12.0 - 15.0 g/dL   HCT 40.1  36.0 - 46.0 %   MCV 88.7  78.0 - 100.0 fL   MCH 30.5  26.0 - 34.0 pg   MCHC 34.4  30.0 - 36.0 g/dL   RDW 12.6  11.5 - 15.5 %   Platelets 224  150 - 400 K/uL   Neutrophils Relative % 83 (*) 43 - 77 %   Neutro Abs 11.9 (*) 1.7 - 7.7 K/uL   Lymphocytes Relative 12  12 - 46 %   Lymphs Abs 1.7  0.7 - 4.0 K/uL   Monocytes Relative 5  3 - 12 %   Monocytes Absolute 0.8  0.1 - 1.0 K/uL   Eosinophils Relative 0  0 - 5 %   Eosinophils Absolute 0.0  0.0 - 0.7 K/uL   Basophils Relative 0  0 - 1 %   Basophils Absolute 0.0  0.0 - 0.1 K/uL  COMPREHENSIVE METABOLIC PANEL     Status: None   Collection Time    10/08/13  4:45 PM      Result Value Ref Range   Sodium 138  137 - 147 mEq/L   Potassium 3.9  3.7 - 5.3 mEq/L   Chloride 102  96 - 112 mEq/L   CO2 22  19 - 32 mEq/L   Glucose, Bld 97  70 - 99 mg/dL   BUN 15  6 - 23 mg/dL   Creatinine, Ser 0.80  0.50 - 1.10 mg/dL   Calcium 9.5  8.4 - 10.5 mg/dL   Total Protein 7.5  6.0 - 8.3 g/dL   Albumin 4.1  3.5 - 5.2 g/dL   AST 24  0 - 37 U/L   ALT 18  0 - 35 U/L   Alkaline Phosphatase 64  39 - 117 U/L   Total Bilirubin 0.6  0.3 - 1.2 mg/dL   GFR calc non Af Amer >90  >90 mL/min   GFR calc Af Amer >90  >90 mL/min   Comment: (NOTE)     The eGFR has been calculated using the CKD EPI equation.     This calculation has not been validated in all clinical situations.      eGFR's persistently <90 mL/min signify possible Chronic Kidney  Disease.  LIPASE, BLOOD     Status: None   Collection Time    10/08/13  4:45 PM      Result Value Ref Range   Lipase 21  11 - 59 U/L    Imaging / Studies: US Abdomen Complete  10/08/2013   CLINICAL DATA:  Right upper quadrant pain, nausea and vomiting. Prior appendectomy.  EXAM: ULTRASOUND ABDOMEN COMPLETE  COMPARISON:  None.  FINDINGS: Gallbladder:  Moderate cholelithiasis with wall thickening measuring 5.4 mm. Positive sonographic Murphy sign.  Common bile duct:  Diameter: 4.2 mm.  Liver:  No focal lesion identified. Within normal limits in parenchymal echogenicity.  IVC:  No abnormality visualized.  Pancreas:  Visualized portion unremarkable.  Spleen:  Size and appearance within normal limits.  Right Kidney:  Length: 10 20 cm. Echogenicity within normal limits. No mass or hydronephrosis visualized.  Left Kidney:  Length: 12.1 cm. Echogenicity within normal limits. No mass or hydronephrosis visualized.  Abdominal aorta:  No aneurysm visualized.  Other findings:  None.  IMPRESSION: Moderate cholelithiasis with mild wall thickening and positive sonographic Murphy sign as findings suggest acute cholecystitis.  These results will be called to the ordering clinician or representative by the Radiologist Assistant, and communication documented in the PACS or zVision Dashboard.   Electronically Signed   By: Marin Olp M.D.   On: 10/08/2013 17:37    Medications / Allergies: per chart  Antibiotics: Anti-infectives   Start     Dose/Rate Route Frequency Ordered Stop   10/08/13 2200  cefTRIAXone (ROCEPHIN) 2 g in dextrose 5 % 50 mL IVPB    Comments:  Pharmacy may adjust dosing strength / duration / interval for maximal efficacy   2 g 100 mL/hr over 30 Minutes Intravenous Every 24 hours 10/08/13 2059     10/08/13 2200  metroNIDAZOLE (FLAGYL) IVPB 500 mg     500 mg 100 mL/hr over 60 Minutes Intravenous Every 6 hours 10/08/13 2059         Assessment  Shelley Ryan  38 y.o. female     Procedure(s): LAPAROSCOPIC CHOLECYSTECTOMY WITH INTRAOPERATIVE CHOLANGIOGRAM  Problem List:  Principal Problem:   Acute calculous cholecystitis Active Problems:   Obesity (BMI 30-39.9)   Acute cholecystitis.  Rest of differential diagnosis seems unlikely  Plan:  Admit  IV fluids  IV antibiotics.  Apparently has numerous intolerances/allergies.  Will try combination of Rocephin and Flagyl since she seems to tolerate that.  Anticipate cholecystectomy this admission.  Most likely in the morning.  I discussed with the patient and her husband.  Reasonable to start out laparoscopically:  The anatomy & physiology of hepatobiliary & pancreatic function was discussed.  The pathophysiology of gallbladder dysfunction was discussed.  Natural history risks without surgery was discussed.   I feel the risks of no intervention will lead to serious problems that outweigh the operative risks; therefore, I recommended cholecystectomy to remove the pathology.  I explained laparoscopic techniques with possible need for an open approach.  Probable cholangiogram to evaluate the bilary tract was explained as well.    Risks such as bleeding, infection, abscess, leak, injury to other organs, need for further treatment, heart attack, death, and other risks were discussed.  I noted a good likelihood this will help address the problem.  Possibility that this will not correct all abdominal symptoms was explained.  Goals of post-operative recovery were discussed as well.  We will work to minimize complications.  An educational handout further explaining the pathology and  treatment options was given as well.  Questions were answered.  The patient expresses understanding & wishes to proceed with surgery.  PPI for baseline GERD VTE prophylaxis- SCDs, etc mobilize as tolerated to help recovery    Adin Hector, M.D., F.A.C.S. Gastrointestinal and Minimally  Invasive Surgery [facility] [address] [place], [place] [address] [phone] Main / Paging   10/08/2013  Note: This dictation was prepared with Dragon/digital dictation along with [facility] technology. Any transcriptional errors that result from this process are unintentional.

## 2013-10-08 NOTE — ED Notes (Signed)
Bed: WHALB Expected date:  Expected time:  Means of arrival:  Comments: ems  

## 2013-10-08 NOTE — ED Provider Notes (Signed)
CSN: [id number]     Arrival date & time 10/08/13  1500 History  This chart was scribed for Shon Batonourtney F Dyanara Cozza, MD by Charline BillsEssence Howell, ED Scribe. The patient was seen in room MH01/MH01. Patient's care was started at 4:10 PM.   Chief Complaint  Patient presents with  . Abdominal Pain   The history is provided by the patient. No language interpreter was used.   HPI Comments: Shelley Ryan is a 38 y.o. female who presents to the Emergency Department complaining of intermittent RUQ abdominal pain that radiates to R chest. Pt states that pain originated as heartburn this morning. She currently rates her pain as 5/10 and describes it as "knife-like". Pt reports eating a large meal last night but states that she has not eaten today. She reports associated chills, nausea and 1 episode of fecal vomiting. She denies diarrhea, fever, urinary symptoms. [facility] and instructed to get a US; suspects gallbladder. Pt was given pain medication at 11:15 AM with relief. She has also taken Prilosec. Pt has a h/o stroke in 2003 with no residual effects. She denies tobacco or alcohol use. Pt also denies possible pregnancy, IUD.  Past Medical History  Diagnosis Date  . CVA (cerebral infarction)    Past Surgical History  Procedure Laterality Date  . Appendectomy      History reviewed. No pertinent family history. History  Substance Use Topics  . Smoking status: Never Smoker   . Smokeless tobacco: Not on file  . Alcohol Use: No   OB History   Grav Para Term Preterm Abortions TAB SAB Ect Mult Living                 Review of Systems  Constitutional: Negative for fever.  Respiratory: Negative for cough, chest tightness and shortness of breath.   Cardiovascular: Negative for chest pain.  Gastrointestinal: Positive for nausea, vomiting and abdominal pain. Negative for diarrhea, constipation and blood in stool.  Genitourinary: Negative for dysuria.  Musculoskeletal: Negative for  back pain.  Neurological: Negative for headaches.  Psychiatric/Behavioral: Negative for confusion.  All other systems reviewed and are negative.  Allergies  Ciprofloxacin; Codeine; Keflex; Reglan; and Tetracyclines & related  Home Medications   Prior to Admission medications   Medication Sig Start Date End Date Taking? Authorizing Provider  omeprazole (PRILOSEC) 40 MG capsule Take 40 mg by mouth daily.   Yes Historical Provider, MD  ondansetron (ZOFRAN-ODT) 4 MG disintegrating tablet Take 4 mg by mouth every 8 (eight) hours as needed for nausea or vomiting.   Yes Historical Provider, MD  traMADol (ULTRAM) 50 MG tablet Take by mouth every 6 (six) hours as needed.   Yes Historical Provider, MD   Triage Vitals: BP 130/83  Pulse 84  Temp(Src) 98.3 F (36.8 C) (Oral)  Resp 18  Ht 5\' 7"  (1.702 m)  Wt 218 lb (98.884 kg)  BMI 34.14 kg/m2  SpO2 99%  LMP 09/24/2013 Physical Exam  Nursing note and vitals reviewed. Constitutional: She is oriented to person, place, and time. She appears well-developed and well-nourished. No distress.  overweight  HENT:  Head: Normocephalic and atraumatic.  Neck: Neck supple.  Cardiovascular: Normal rate, regular rhythm and normal heart sounds.   No murmur heard. Pulmonary/Chest: Effort normal and breath sounds normal. No respiratory distress. She has no wheezes.  Abdominal: Soft. Bowel sounds are normal. There is tenderness. There is no rebound and no guarding.  RUQ ttp without rebound or guarding, +  Murphy's  Musculoskeletal: She exhibits no edema.  Neurological: She is alert and oriented to person, place, and time.  Skin: Skin is warm and dry.  Psychiatric: She has a normal mood and affect.    ED Course  Procedures (including critical care time) DIAGNOSTIC STUDIES: Oxygen Saturation is 99% on RA, normal by my interpretation.    COORDINATION OF CARE: 4:12 PM Discussed treatment plan with pt at bedside and pt agreed to plan.  Labs Review Labs  Reviewed  CBC WITH DIFFERENTIAL - Abnormal; Notable for the following:    WBC 14.4 (*)    Neutrophils Relative % 83 (*)    Neutro Abs 11.9 (*)    All other components within normal limits  URINALYSIS, ROUTINE W REFLEX MICROSCOPIC  PREGNANCY, URINE  COMPREHENSIVE METABOLIC PANEL  LIPASE, BLOOD   Imaging Review Kore[place] Abdomen Complete  10/08/2013   CLINICAL DATA:  Right upper quadrant pain, nausea and vomiting. Prior appendectomy.  EXAM: ULTRASOUND ABDOMEN COMPLETE  COMPARISON:  None.  FINDINGS: Gallbladder:  Moderate cholelithiasis with wall thickening measuring 5.4 mm. Positive sonographic Murphy sign.  Common bile duct:  Diameter: 4.2 mm.  Liver:  No focal lesion identified. Within normal limits in parenchymal echogenicity.  IVC:  No abnormality visualized.  Pancreas:  Visualized portion unremarkable.  Spleen:  Size and appearance within normal limits.  Right Kidney:  Length: 10 20 cm. Echogenicity within normal limits. No mass or hydronephrosis visualized.  Left Kidney:  Length: 12.1 cm. Echogenicity within normal limits. No mass or hydronephrosis visualized.  Abdominal aorta:  No aneurysm visualized.  Other findings:  None.  IMPRESSION: Moderate cholelithiasis with mild wall thickening and positive sonographic Murphy sign as findings suggest acute cholecystitis.  These results will be called to the ordering clinician or representative by the Radiologist Assistant, and communication documented in the PACS or zVision Dashboard.   Electronically Signed   By: Elberta Fortisaniel  Boyle M.D.   On: 10/08/2013 17:37    EKG Interpretation None      MDM   Final diagnoses:  Acute cholecystitis    Patient presents with epigastric and right upper quadrant pain as well as nausea and vomiting onset earlier this morning. Was evaluated at an urgent care with concerns for gallbladder pathology. Patient has positive Murphy sign on exam but is otherwise nontoxic. Patient was given pain medication. Noted to have  leukocytosis to 14.4. Ultrasound concerning for gallbladder wall thickening and positive sonographic Murphy's sign. Given this and leukocytosis, discussed with Dr. Michaell CowingGross who will evaluate the patient [facility].  I also discussed patient disposition with my colleague Dr. Rennis ChrisJacobowitz.  I personally performed the services described in this documentation, which was scribed in my presence. The recorded information has been reviewed and is accurate.     Shon Batonourtney F Gabriela Giannelli, MD 10/08/13 [phone]

## 2013-10-08 NOTE — ED Notes (Signed)
[facility] with dx of gallstones--- transferred here for surgery evaluation.  Pt presents to ED A/Ox4, in no s/s distress noted.

## 2013-10-08 NOTE — ED Notes (Addendum)
Pt c/o right upper abd pain with n/v x  [facility] to get a [place]

## 2013-10-09 ENCOUNTER — Inpatient Hospital Stay (HOSPITAL_COMMUNITY): Payer: BC Managed Care – PPO

## 2013-10-09 ENCOUNTER — Inpatient Hospital Stay (HOSPITAL_COMMUNITY): Payer: BC Managed Care – PPO | Admitting: Anesthesiology

## 2013-10-09 ENCOUNTER — Encounter (HOSPITAL_COMMUNITY): Payer: Self-pay | Admitting: Anesthesiology

## 2013-10-09 ENCOUNTER — Encounter (HOSPITAL_COMMUNITY): Admission: EM | Disposition: A | Discharge status: Home / Self Care | Payer: Self-pay | Source: Home / Self Care

## 2013-10-09 ENCOUNTER — Encounter (HOSPITAL_COMMUNITY): Payer: BC Managed Care – PPO | Admitting: Anesthesiology

## 2013-10-09 DIAGNOSIS — Z9049 Acquired absence of other specified parts of digestive tract: Secondary | ICD-10-CM

## 2013-10-09 DIAGNOSIS — K8010 Calculus of gallbladder with chronic cholecystitis without obstruction: Secondary | ICD-10-CM

## 2013-10-09 HISTORY — PX: CHOLECYSTECTOMY: SHX55

## 2013-10-09 LAB — BASIC METABOLIC PANEL
BUN: 12 mg/dL (ref 6–23)
CO2: 22 meq/L (ref 19–32)
Calcium: 8.5 mg/dL (ref 8.4–10.5)
Chloride: 104 mEq/L (ref 96–112)
Creatinine, Ser: 0.75 mg/dL (ref 0.50–1.10)
Glucose, Bld: 88 mg/dL (ref 70–99)
Potassium: 4.7 mEq/L (ref 3.7–5.3)
Sodium: 138 mEq/L (ref 137–147)

## 2013-10-09 LAB — CBC
HCT: 37.1 % (ref 36.0–46.0)
Hemoglobin: 12.3 g/dL (ref 12.0–15.0)
MCH: 29.2 pg (ref 26.0–34.0)
MCHC: 33.2 g/dL (ref 30.0–36.0)
MCV: 88.1 fL (ref 78.0–100.0)
PLATELETS: 193 10*3/uL (ref 150–400)
RBC: 4.21 MIL/uL (ref 3.87–5.11)
RDW: 12.8 % (ref 11.5–15.5)
WBC: 10.3 10*3/uL (ref 4.0–10.5)

## 2013-10-09 LAB — SURGICAL PCR SCREEN
MRSA, PCR: NEGATIVE
Staphylococcus aureus: NEGATIVE

## 2013-10-09 SURGERY — LAPAROSCOPIC CHOLECYSTECTOMY WITH INTRAOPERATIVE CHOLANGIOGRAM
Anesthesia: General | Site: Abdomen

## 2013-10-09 MED ORDER — HEPARIN SODIUM (PORCINE) 5000 UNIT/ML IJ SOLN
5000.0000 [IU] | Freq: Three times a day (TID) | INTRAMUSCULAR | Status: DC
  Administered 2013-10-09 – 2013-10-10 (×2): 5000 [IU] via SUBCUTANEOUS
  Filled 2013-10-09 (×5): qty 1

## 2013-10-09 MED ORDER — HYDROMORPHONE HCL PF 1 MG/ML IJ SOLN
INTRAMUSCULAR | Status: AC
  Filled 2013-10-09: qty 1

## 2013-10-09 MED ORDER — MIDAZOLAM HCL 5 MG/5ML IJ SOLN
INTRAMUSCULAR | Status: DC | PRN
  Administered 2013-10-09: 2 mg via INTRAVENOUS

## 2013-10-09 MED ORDER — ONDANSETRON HCL 4 MG/2ML IJ SOLN
INTRAMUSCULAR | Status: AC
  Filled 2013-10-09: qty 2

## 2013-10-09 MED ORDER — MORPHINE SULFATE 2 MG/ML IJ SOLN
1.0000 mg | INTRAMUSCULAR | Status: DC | PRN
  Administered 2013-10-09 – 2013-10-10 (×2): 1 mg via INTRAVENOUS
  Filled 2013-10-09 (×2): qty 1

## 2013-10-09 MED ORDER — PROMETHAZINE HCL 25 MG/ML IJ SOLN
6.2500 mg | INTRAMUSCULAR | Status: DC | PRN
  Administered 2013-10-09: 12.5 mg via INTRAVENOUS

## 2013-10-09 MED ORDER — FENTANYL CITRATE 0.05 MG/ML IJ SOLN
INTRAMUSCULAR | Status: AC
  Filled 2013-10-09: qty 5

## 2013-10-09 MED ORDER — ROCURONIUM BROMIDE 100 MG/10ML IV SOLN: INTRAVENOUS | Status: DC | PRN

## 2013-10-09 MED ORDER — GLYCOPYRROLATE 0.2 MG/ML IJ SOLN
INTRAMUSCULAR | Status: DC | PRN
  Administered 2013-10-09: 0.6 mg via INTRAVENOUS

## 2013-10-09 MED ORDER — ONDANSETRON HCL 4 MG/2ML IJ SOLN: 4.0000 mg | Freq: Four times a day (QID) | INTRAMUSCULAR | Status: DC | PRN

## 2013-10-09 MED ORDER — LACTATED RINGERS IV SOLN
INTRAVENOUS | Status: DC
  Administered 2013-10-09: 1000 mL via INTRAVENOUS
  Administered 2013-10-09: 15:00:00 via INTRAVENOUS

## 2013-10-09 MED ORDER — ROCURONIUM BROMIDE 100 MG/10ML IV SOLN
INTRAVENOUS | Status: AC
  Filled 2013-10-09: qty 1

## 2013-10-09 MED ORDER — BUPIVACAINE LIPOSOME 1.3 % IJ SUSP
20.0000 mL | Freq: Once | INTRAMUSCULAR | Status: DC
  Filled 2013-10-09: qty 20

## 2013-10-09 MED ORDER — ROCURONIUM BROMIDE 100 MG/10ML IV SOLN
INTRAVENOUS | Status: DC | PRN
  Administered 2013-10-09: 50 mg via INTRAVENOUS
  Administered 2013-10-09: 5 mg via INTRAVENOUS

## 2013-10-09 MED ORDER — LABETALOL HCL 5 MG/ML IV SOLN
INTRAVENOUS | Status: AC
  Filled 2013-10-09: qty 4

## 2013-10-09 MED ORDER — SCOPOLAMINE 1 MG/3DAYS TD PT72
MEDICATED_PATCH | TRANSDERMAL | Status: AC
  Filled 2013-10-09: qty 1

## 2013-10-09 MED ORDER — NEOSTIGMINE METHYLSULFATE 10 MG/10ML IV SOLN
INTRAVENOUS | Status: DC | PRN
  Administered 2013-10-09: 4 mg via INTRAVENOUS

## 2013-10-09 MED ORDER — FENTANYL CITRATE 0.05 MG/ML IJ SOLN: 25.0000 ug | INTRAMUSCULAR | Status: DC | PRN

## 2013-10-09 MED ORDER — DEXAMETHASONE SODIUM PHOSPHATE 10 MG/ML IJ SOLN
INTRAMUSCULAR | Status: DC | PRN
  Administered 2013-10-09: 10 mg via INTRAVENOUS

## 2013-10-09 MED ORDER — FENTANYL CITRATE 0.05 MG/ML IJ SOLN
INTRAMUSCULAR | Status: DC | PRN
  Administered 2013-10-09 (×7): 50 ug via INTRAVENOUS

## 2013-10-09 MED ORDER — HYDROMORPHONE HCL PF 1 MG/ML IJ SOLN
0.2500 mg | INTRAMUSCULAR | Status: DC | PRN
  Administered 2013-10-09 (×2): 0.5 mg via INTRAVENOUS

## 2013-10-09 MED ORDER — FENTANYL CITRATE 0.05 MG/ML IJ SOLN
INTRAMUSCULAR | Status: AC
  Filled 2013-10-09: qty 2

## 2013-10-09 MED ORDER — PROPOFOL 10 MG/ML IV BOLUS
INTRAVENOUS | Status: AC
  Filled 2013-10-09: qty 20

## 2013-10-09 MED ORDER — DEXAMETHASONE SODIUM PHOSPHATE 10 MG/ML IJ SOLN
INTRAMUSCULAR | Status: AC
  Filled 2013-10-09: qty 1

## 2013-10-09 MED ORDER — MIDAZOLAM HCL 2 MG/2ML IJ SOLN
INTRAMUSCULAR | Status: AC
  Filled 2013-10-09: qty 2

## 2013-10-09 MED ORDER — LACTATED RINGERS IR SOLN
Status: DC | PRN
  Administered 2013-10-09: 2000 mL

## 2013-10-09 MED ORDER — BUPIVACAINE LIPOSOME 1.3 % IJ SUSP
INTRAMUSCULAR | Status: DC | PRN
  Administered 2013-10-09: 20 mL

## 2013-10-09 MED ORDER — ONDANSETRON HCL 4 MG PO TABS: 4.0000 mg | ORAL_TABLET | Freq: Four times a day (QID) | ORAL | Status: DC | PRN

## 2013-10-09 MED ORDER — LABETALOL HCL 5 MG/ML IV SOLN
INTRAVENOUS | Status: DC | PRN
  Administered 2013-10-09 (×2): 5 mg via INTRAVENOUS

## 2013-10-09 MED ORDER — PROMETHAZINE HCL 25 MG/ML IJ SOLN
INTRAMUSCULAR | Status: AC
  Filled 2013-10-09: qty 1

## 2013-10-09 MED ORDER — GLYCOPYRROLATE 0.2 MG/ML IJ SOLN
INTRAMUSCULAR | Status: AC
Start: 2013-10-09 — End: 2013-10-09
  Filled 2013-10-09: qty 3

## 2013-10-09 MED ORDER — HYDROCODONE-ACETAMINOPHEN 5-325 MG PO TABS
1.0000 | ORAL_TABLET | ORAL | Status: DC | PRN
  Filled 2013-10-09: qty 1

## 2013-10-09 MED ORDER — PROPOFOL 10 MG/ML IV BOLUS
INTRAVENOUS | Status: DC | PRN
  Administered 2013-10-09: 140 mg via INTRAVENOUS

## 2013-10-09 MED ORDER — SCOPOLAMINE 1 MG/3DAYS TD PT72
MEDICATED_PATCH | TRANSDERMAL | Status: DC | PRN
  Administered 2013-10-09: 1 via TRANSDERMAL

## 2013-10-09 MED ORDER — ONDANSETRON HCL 4 MG/2ML IJ SOLN
INTRAMUSCULAR | Status: DC | PRN
  Administered 2013-10-09: 4 mg via INTRAVENOUS

## 2013-10-09 MED ORDER — NEOSTIGMINE METHYLSULFATE 10 MG/10ML IV SOLN
INTRAVENOUS | Status: AC
  Filled 2013-10-09: qty 1

## 2013-10-09 MED ORDER — KCL IN DEXTROSE-NACL 20-5-0.45 MEQ/L-%-% IV SOLN
INTRAVENOUS | Status: DC
  Administered 2013-10-09: 17:00:00 via INTRAVENOUS
  Filled 2013-10-09 (×4): qty 1000

## 2013-10-09 MED ORDER — IOHEXOL 300 MG/ML  SOLN
INTRAMUSCULAR | Status: DC | PRN
  Administered 2013-10-09: 5 mL via INTRAVENOUS

## 2013-10-09 SURGICAL SUPPLY — 41 items
ADH SKN CLS APL DERMABOND .7 (GAUZE/BANDAGES/DRESSINGS) ×1
APPLIER CLIP ROT 10 11.4 M/L (STAPLE) ×2
APR CLP MED LRG 11.4X10 (STAPLE) ×1
BAG SPEC RTRVL 10 TROC 200 (ENDOMECHANICALS) ×1
BAG SPEC RTRVL LRG 6X4 10 (ENDOMECHANICALS) ×1
CABLE HIGH FREQUENCY MONO STRZ (ELECTRODE) IMPLANT
CATH REDDICK CHOLANGI 4FR 50CM (CATHETERS) ×2 IMPLANT
CLIP APPLIE ROT 10 11.4 M/L (STAPLE) ×1 IMPLANT
COVER MAYO STAND STRL (DRAPES) ×2 IMPLANT
DECANTER SPIKE VIAL GLASS SM (MISCELLANEOUS) ×1 IMPLANT
DERMABOND ADVANCED (GAUZE/BANDAGES/DRESSINGS) ×1
DERMABOND ADVANCED .7 DNX12 (GAUZE/BANDAGES/DRESSINGS) IMPLANT
DRAPE C-ARM 42X120 X-RAY (DRAPES) ×2 IMPLANT
DRAPE LAPAROSCOPIC ABDOMINAL (DRAPES) ×2 IMPLANT
ELECT REM PT RETURN 9FT ADLT (ELECTROSURGICAL) ×2
ELECTRODE REM PT RTRN 9FT ADLT (ELECTROSURGICAL) ×1 IMPLANT
GLOVE BIOGEL M 8.0 STRL (GLOVE) ×2 IMPLANT
GOWN BRE IMP SLV AUR XL STRL (GOWN DISPOSABLE) ×1 IMPLANT
GOWN SPEC L4 XLG W/TWL (GOWN DISPOSABLE) ×2 IMPLANT
GOWN STRL REUS W/TWL XL LVL3 (GOWN DISPOSABLE) ×5 IMPLANT
HEMOSTAT SURGICEL 4X8 (HEMOSTASIS) IMPLANT
IV CATH 14GX2 1/4 (CATHETERS) ×3 IMPLANT
IV LACTATED RINGERS 1000ML (IV SOLUTION) ×2 IMPLANT
KIT BASIN OR (CUSTOM PROCEDURE TRAY) ×2 IMPLANT
POUCH RETRIEVAL ECOSAC 10 (ENDOMECHANICALS) ×1 IMPLANT
POUCH RETRIEVAL ECOSAC 10MM (ENDOMECHANICALS) ×1
POUCH SPECIMEN RETRIEVAL 10MM (ENDOMECHANICALS) ×1 IMPLANT
SCISSORS LAP 5X45 EPIX DISP (ENDOMECHANICALS) ×2 IMPLANT
SCRUB PCMX 4 OZ (MISCELLANEOUS) ×2 IMPLANT
SET IRRIG TUBING LAPAROSCOPIC (IRRIGATION / IRRIGATOR) ×2 IMPLANT
SLEEVE XCEL OPT CAN 5 100 (ENDOMECHANICALS) ×4 IMPLANT
SOLUTION ANTI FOG 6CC (MISCELLANEOUS) ×2 IMPLANT
SUT VIC AB 4-0 SH 18 (SUTURE) ×2 IMPLANT
SYR 20CC LL (SYRINGE) ×2 IMPLANT
TOWEL OR 17X26 10 PK STRL BLUE (TOWEL DISPOSABLE) ×2 IMPLANT
TOWEL OR NON WOVEN STRL DISP B (DISPOSABLE) ×2 IMPLANT
TRAY LAP CHOLE (CUSTOM PROCEDURE TRAY) ×2 IMPLANT
TROCAR BLADELESS OPT 5 100 (ENDOMECHANICALS) ×2 IMPLANT
TROCAR XCEL BLUNT TIP 100MML (ENDOMECHANICALS) IMPLANT
TROCAR XCEL NON-BLD 11X100MML (ENDOMECHANICALS) ×2 IMPLANT
TUBING INSUFFLATION 10FT LAP (TUBING) ×2 IMPLANT

## 2013-10-09 NOTE — Anesthesia Preprocedure Evaluation (Addendum)
Anesthesia Evaluation  Patient identified by MRN, date of birth, ID band Patient awake    Reviewed: Allergy & Precautions, H&P , NPO status , Patient's Chart, lab work & pertinent test results  Airway Mallampati: II TM Distance: >3 FB Neck ROM: Full    Dental no notable dental hx.    Pulmonary neg pulmonary ROS,  breath sounds clear to auscultation  Pulmonary exam normal       Cardiovascular + Peripheral Vascular Disease negative cardio ROS  Rhythm:Regular Rate:Normal     Neuro/Psych CVA negative psych ROS   GI/Hepatic negative GI ROS, Neg liver ROS,   Endo/Other  negative endocrine ROS  Renal/GU negative Renal ROS  negative genitourinary   Musculoskeletal negative musculoskeletal ROS (+)   Abdominal (+) + obese,   Peds negative pediatric ROS (+)  Hematology negative hematology ROS (+)   Anesthesia Other Findings   Reproductive/Obstetrics negative OB ROS                          Anesthesia Physical Anesthesia Plan  ASA: III  Anesthesia Plan: General   Post-op Pain Management:    Induction: Intravenous  Airway Management Planned: Oral ETT  Additional Equipment:   Intra-op Plan:   Post-operative Plan: Extubation in OR  Informed Consent: I have reviewed the patients History and Physical, chart, labs and discussed the procedure including the risks, benefits and alternatives for the proposed anesthesia with the patient or authorized representative who has indicated his/her understanding and acceptance.   Dental advisory given  Plan Discussed with: CRNA  Anesthesia Plan Comments:        Anesthesia Quick Evaluation

## 2013-10-09 NOTE — Care Management Note (Addendum)
    Page 1 of 1   10/10/2013     11:46:48 AM CARE MANAGEMENT NOTE 10/10/2013  Patient:  Shelley Ryan,Shelley Ryan   Account Number:  [id number]  Date Initiated:  10/09/2013  Documentation initiated by:  Lanier ClamMAHABIR,KATHY  Subjective/Objective Assessment:   37 Y/O F ADMITTED W/RUQ PAIN,N/V.     Action/Plan:   FROM HOME.HAS PCP,PHARMACY.   Anticipated DC Date:  10/10/2013   Anticipated DC Plan:  HOME/SELF CARE      DC Planning Services  CM consult      Choice offered to / List presented to:             Status of service:  Completed, signed off Medicare Important Message given?   (If response is "NO", the following Medicare IM given date fields will be blank) Date Medicare IM given:   Date Additional Medicare IM given:    Discharge Disposition:  HOME/SELF CARE  Per UR Regulation:  Reviewed for med. necessity/level of care/duration of stay  If discussed at Long Length of Stay Meetings, dates discussed:    Comments:  10/09/13 KATHY MAHABIR RN,BSN NCM [phone] Ryan/P LAP CHOLE.NO ANTICIPATED D/C NEEDS.

## 2013-10-09 NOTE — Transfer of Care (Signed)
Immediate Anesthesia Transfer of Care Note  Patient: Shelley Ryan  Procedure(s) Performed: Procedure(s): LAPAROSCOPIC CHOLECYSTECTOMY WITH INTRAOPERATIVE CHOLANGIOGRAM (N/A)  Patient Location: PACU  Anesthesia Type:General  Level of Consciousness: awake, alert , oriented and patient cooperative  Airway & Oxygen Therapy: Patient Spontanous Breathing and Patient connected to face mask oxygen  Post-op Assessment: Report given to PACU RN and Post -op Vital signs reviewed and stable  Post vital signs: Reviewed and stable  Complications: No apparent anesthesia complications

## 2013-10-09 NOTE — Op Note (Signed)
Shelley HaitNancy S Ryan @date @  Procedure: Laparoscopic Cholecystectomy with intraoperative cholangiogram  Surgeon: Wenda LowMatt Martin, MD, FACS Asst:  none  Anes:  General  Drains: None  Findings: Acute cholecystitis with normal IOC  Description of Procedure: The patient was taken to OR 6 and given general anesthesia.  The patient was prepped with PCMX and draped sterilely. A time out was performed.  Access to the abdomen was achieved with 5 mm Optiview through the right upper quadrant without difficulty.  Anatomy was short waisted with narrow costal angle.  Port placement included 3 five mm and one 12 in the upper midline.    The gallbladder was visualized and the fundus was grasped and the gallbladder was elevated. Traction on the infundibulum allowed for successful demonstration of the critical view. Inflammatory changes were chronic with acute edema in the wall.  The cystic duct was identified and clipped up on the gallbladder and an incision was made in the cystic duct and the Reddick catheter was inserted after milking the cystic duct of any debris. A dynamic cholangiogram was performed which demonstrated small intrahepatic and common bile ducts and free flow into the duodenum.    The cystic duct was then triple clipped and divided, the cystic artery was double clipped and divided and then the gallbladder was removed from the gallbladder bed. Removal of the gallbladder from the gallbladder bed was performed with minimal bile drainage but no stones .  The gallbladder was then placed in a bag and brought out through one of the 12 mm trocar sites--I had to cut and dilate the upper tract because of the large amount of stone material.  This was closed with 0- vicryl. The gallbladder bed was inspected and no bleeding or bile leaks were seen.   Incisions were injected with Exparel and closed with 4-0 Vicryl and Dermabond on the skin.  Sponge and needle count were correct.    The patient was taken to the recovery  room in satisfactory condition.

## 2013-10-09 NOTE — Anesthesia Postprocedure Evaluation (Signed)
  Anesthesia Post-op Note  Patient: Shelley Ryan  Procedure(s) Performed: Procedure(s) (LRB): LAPAROSCOPIC CHOLECYSTECTOMY WITH INTRAOPERATIVE CHOLANGIOGRAM (N/A)  Patient Location: PACU  Anesthesia Type: General  Level of Consciousness: awake and alert   Airway and Oxygen Therapy: Patient Spontanous Breathing  Post-op Pain: mild  Post-op Assessment: Post-op Vital signs reviewed, Patient's Cardiovascular Status Stable, Respiratory Function Stable, Patent Airway and No signs of Nausea or vomiting  Last Vitals:  Filed Vitals:   10/09/13 1725  BP: 130/82  Pulse: 81  Temp: 37.3 C  Resp: 16    Post-op Vital Signs: stable   Complications: No apparent anesthesia complications

## 2013-10-09 NOTE — Interval H&P Note (Signed)
History and Physical Interval Note:  10/09/2013 11:27 AM  Shelley Ryan  has presented today for surgery, with the diagnosis of cholelithiasis/cholecystitis  The various methods of treatment have been discussed with the patient and family. After consideration of risks, benefits and other options for treatment, the patient has consented to  Procedure(s): LAPAROSCOPIC CHOLECYSTECTOMY WITH INTRAOPERATIVE CHOLANGIOGRAM (N/A) as a surgical intervention .  The patient's history has been reviewed, patient examined, no change in status, stable for surgery.  I have reviewed the patient's chart and labs.  Questions were answered to the patient's satisfaction.     MARTIN,MATTHEW B

## 2013-10-10 ENCOUNTER — Encounter (HOSPITAL_COMMUNITY): Payer: Self-pay | Admitting: Surgery

## 2013-10-10 DIAGNOSIS — K810 Acute cholecystitis: Secondary | ICD-10-CM | POA: Diagnosis present

## 2013-10-10 LAB — CBC
HCT: 39.4 % (ref 36.0–46.0)
HEMOGLOBIN: 13.1 g/dL (ref 12.0–15.0)
MCH: 29.2 pg (ref 26.0–34.0)
MCHC: 33.2 g/dL (ref 30.0–36.0)
MCV: 87.8 fL (ref 78.0–100.0)
Platelets: 215 10*3/uL (ref 150–400)
RBC: 4.49 MIL/uL (ref 3.87–5.11)
RDW: 12.9 % (ref 11.5–15.5)
WBC: 16.1 10*3/uL — ABNORMAL HIGH (ref 4.0–10.5)

## 2013-10-10 LAB — COMPREHENSIVE METABOLIC PANEL
ALT: 29 U/L (ref 0–35)
AST: 33 U/L (ref 0–37)
Albumin: 3.5 g/dL (ref 3.5–5.2)
Alkaline Phosphatase: 60 U/L (ref 39–117)
BUN: 9 mg/dL (ref 6–23)
CALCIUM: 9 mg/dL (ref 8.4–10.5)
CO2: 21 meq/L (ref 19–32)
Chloride: 103 mEq/L (ref 96–112)
Creatinine, Ser: 0.72 mg/dL (ref 0.50–1.10)
GFR calc Af Amer: >90 mL/min (ref >90)
GFR calc non Af Amer: >90 mL/min (ref >90)
Glucose, Bld: 122 mg/dL — ABNORMAL HIGH (ref 70–99)
Potassium: 4.1 mEq/L (ref 3.7–5.3)
SODIUM: 137 meq/L (ref 137–147)
Total Bilirubin: 0.5 mg/dL (ref 0.3–1.2)
Total Protein: 6.5 g/dL (ref 6.0–8.3)

## 2013-10-10 MED ORDER — TRAMADOL HCL 50 MG PO TABS
50.0000 mg | ORAL_TABLET | Freq: Four times a day (QID) | ORAL | Status: DC | PRN
  Administered 2013-10-10: 100 mg via ORAL
  Filled 2013-10-10: qty 2

## 2013-10-10 MED ORDER — TRAMADOL HCL 50 MG PO TABS: 50.0000 mg | ORAL_TABLET | Freq: Four times a day (QID) | ORAL | Status: DC | PRN

## 2013-10-10 NOTE — Progress Notes (Signed)
Nurse reviewed discharge instructions with pt.  Pt verbalized understanding of discharge instructions and new medications along with follow up appointment.  No concerns at time of discharge.

## 2013-10-10 NOTE — Discharge Instructions (Signed)
Your appointment is at 3:00pm, please arrive at least 30 min before your appointment to complete your check in paperwork.  If you are unable to arrive 30 min prior to your appointment time we may have to cancel or reschedule you.  [facility] ______[facility] LAPAROSCOPIC SURGERY: POST OP INSTRUCTIONS Always review your discharge instruction sheet given to you by the facility where your surgery was performed. IF YOU HAVE DISABILITY OR FAMILY LEAVE FORMS, YOU MUST BRING THEM TO THE OFFICE FOR PROCESSING.   DO NOT GIVE THEM TO YOUR DOCTOR.  1. A prescription for pain medication may be given to you upon discharge.  Take your pain medication as prescribed, if needed.  If narcotic pain medicine is not needed, then you may take acetaminophen (Tylenol) or ibuprofen (Advil) as needed. 2. Take your usually prescribed medications unless otherwise directed. 3. If you need a refill on your pain medication, please contact your pharmacy.  They will contact our office to request authorization. Prescriptions will not be filled after 5pm or on week-ends. 4. You should follow a light diet the first few days after arrival home, such as soup and crackers, etc.  Be sure to include lots of fluids daily. 5. Most patients will experience some swelling and bruising in the area of the incisions.  Ice packs will help.  Swelling and bruising can take several days to resolve.  6. It is common to experience some constipation if taking pain medication after surgery.  Increasing fluid intake and taking a stool softener (such as Colace) will usually help or prevent this problem from occurring.  A mild laxative (Milk of Magnesia or Miralax) should be taken according to package instructions if there are no bowel movements after 48 hours. 7. Unless discharge instructions indicate otherwise, you may remove your bandages 24-48 hours after surgery, and you may shower at that time.  You may have steri-strips (small skin tapes) in  place directly over the incision.  These strips should be left on the skin for 7-10 days.  If your surgeon used skin glue on the incision, you may shower in 24 hours.  The glue will flake off over the next 2-3 weeks.  Any sutures or staples will be removed at the office during your follow-up visit. 8. ACTIVITIES:  You may resume regular (light) daily activities beginning the next day--such as daily self-care, walking, climbing stairs--gradually increasing activities as tolerated.  You may have sexual intercourse when it is comfortable.  Refrain from any heavy lifting or straining until approved by your doctor. a. You may drive when you are no longer taking prescription pain medication, you can comfortably wear a seatbelt, and you can safely maneuver your car and apply brakes. b. RETURN TO WORK:  __________________________________________________________ 9. You should see your doctor in the office for a follow-up appointment approximately 2-3 weeks after your surgery.  Make sure that you call for this appointment within a day or two after you arrive home to insure a convenient appointment time. 10. OTHER INSTRUCTIONS: __________________________________________________________________________________________________________________________ __________________________________________________________________________________________________________________________ WHEN TO CALL YOUR DOCTOR: 1. Fever over 101.0 2. Inability to urinate 3. Continued bleeding from incision. 4. Increased pain, redness, or drainage from the incision. 5. Increasing abdominal pain  The clinic staff is available to answer your questions during regular business hours.  Please dont hesitate to call and ask to speak to one of the nurses for clinical concerns.  If you have a medical emergency, go to the nearest emergency room or call 911.  A surgeon from [facility] is always on call at the hospital. °[address], [address], [place], [place]  [address] ? [address] [address], [place], [place]   [address] °[phone] ? [phone] ? FAX [phone] °Web site: [email or web address] ° °

## 2013-10-10 NOTE — Discharge Summary (Signed)
[facility] Discharge Summary   Patient ID: Shelley Ryan MRN: [id number] DOB/AGE: 40/09/1975 40 y.o.  Admit date: 10/08/2013 Discharge date: 10/10/2013  Admitting Diagnosis: Acute cholecystitis  Discharge Diagnosis Patient Active Problem List   Diagnosis Date Noted  . Status post laparoscopic cholecystectomy June 2015 10/09/2013  . Obesity (BMI 30-39.9) 10/08/2013  . PCOS (polycystic ovarian syndrome)     Consultants None  Imaging: Dg Cholangiogram Operative  10/09/2013   CLINICAL DATA:  Laparoscopic cholecystectomy  EXAM: INTRAOPERATIVE CHOLANGIOGRAM  FLUOROSCOPY TIME:  15 seconds  COMPARISON:  Abdominal ultrasound - 10/08/2013  FINDINGS: Intraoperative angiographic images of the right upper abdominal quadrant during laparoscopic cholecystectomy are provided for review.  Surgical clips overlie the expected location of the gallbladder fossa.  Contrast injection demonstrates selective cannulation of the central aspect of the cystic duct.  There is passage of contrast through the central aspect of the cystic duct with filling of a non dilated common bile duct. There is passage of contrast though the CBD and into the descending portion of the duodenum.  There is minimal reflux of injected contrast into the common hepatic duct and central aspect of the non dilated intrahepatic biliary system.  There are no discrete filling defects within the opacified portions of the biliary system to suggest the presence of choledocholithiasis.  IMPRESSION: No evidence of choledocholithiasis.   Electronically Signed   By: Simonne ComeJohn  Watts M.D.   On: 10/09/2013 13:14   [place] Abdomen Complete  10/08/2013   CLINICAL DATA:  Right upper quadrant pain, nausea and vomiting. Prior appendectomy.  EXAM: ULTRASOUND ABDOMEN COMPLETE  COMPARISON:  None.  FINDINGS: Gallbladder:  Moderate cholelithiasis with wall thickening measuring 5.4 mm. Positive sonographic Murphy sign.  Common bile duct:  Diameter: 4.2 mm.   Liver:  No focal lesion identified. Within normal limits in parenchymal echogenicity.  IVC:  No abnormality visualized.  Pancreas:  Visualized portion unremarkable.  Spleen:  Size and appearance within normal limits.  Right Kidney:  Length: 10 20 cm. Echogenicity within normal limits. No mass or hydronephrosis visualized.  Left Kidney:  Length: 12.1 cm. Echogenicity within normal limits. No mass or hydronephrosis visualized.  Abdominal aorta:  No aneurysm visualized.  Other findings:  None.  IMPRESSION: Moderate cholelithiasis with mild wall thickening and positive sonographic Murphy sign as findings suggest acute cholecystitis.  These results will be called to the ordering clinician or representative by the Radiologist Assistant, and communication documented in the PACS or zVision Dashboard.   Electronically Signed   By: Elberta Fortisaniel  Boyle M.D.   On: 10/08/2013 17:37    Procedures Dr. Daphine DeutscherMartin (10/10/13) - [facility]  [facility]ospital Course:  38 y/o obese active female. She comes today with her husband. History of mild reflux in the past usually controlled with omeprazole. Lives in [place] but was down in [place] for the weekend. Awoke last night with severe upper abdominal pain. Primarily right-sided. It intensified with worsening nausea and vomiting. She has never had anything like this before. Did not improve with over-the-counter medications. Went to Urgent care Center in [place]. Suspicion of gallbladder etiology. Patient had worsening pain on ride home. Came to [facility] emergency department. Concern for cholecystitis. [facility].   The pain is primarily in the right upper abdomen. Radiates to the back. Severe nausea and vomiting. Improve with narcotic IV pain medication but not resolved. This does not seem like heartburn or reflux. She walks 5  miles a day. Appendectomy but no other surgeries. She does not  smoke. Had a stroke over 10 years ago. Seems most likely due to hypercoagulable state on hormone therapy for polycystic ovarian disease. No history of any strokes or other issues. Claims normal hypercoagulable workup done  Patient was admitted and underwent procedure listed above.  Tolerated procedure well and was transferred to the floor.  Diet was advanced as tolerated.  On POD #1, the patient was voiding well, tolerating diet, ambulating well, pain well controlled, vital signs stable, incisions c/d/i and felt stable for discharge home.  Patient will follow up in our office in 3 weeks and knows to call with questions or concerns.   Physical Exam: General:  Alert, NAD, pleasant, comfortable Abd:  Soft, ND, mild tenderness, incisions C/D/I and closed with dermabond, larger incision at epigastrium is most tender    Medication List         cholecalciferol 1000 UNITS tablet  Commonly known as:  VITAMIN D  Take 1,000 Units by mouth daily.     multivitamin with minerals tablet  Take 1 tablet by mouth daily.     omeprazole 40 MG capsule  Commonly known as:  PRILOSEC  Take 40 mg by mouth daily.     ondansetron 4 MG disintegrating tablet  Commonly known as:  ZOFRAN-ODT  Take 4 mg by mouth every 8 (eight) hours as needed for nausea or vomiting.     traMADol 50 MG tablet  Commonly known as:  ULTRAM  Take 1-2 tablets (50-100 mg total) by mouth every 6 (six) hours as needed.         Follow-up Information   Follow up with [facility] On 10/30/2013. (For post-operation check.  Your appointment is at 3:00pm, please arrive at least 30 min before your appointment to complete your check in paperwork.  If you are unable to arrive 30 min prior to your appointment time we may have to cancel or reschedule yo)    Contact information:   [address]   [place] [address] [phone]       Signed: Candiss NorseMegan Dort, PA-C [facility] [phone]  10/10/2013,  8:59 AM

## 2013-10-16 ENCOUNTER — Encounter (INDEPENDENT_AMBULATORY_CARE_PROVIDER_SITE_OTHER): Payer: Self-pay | Admitting: General Surgery

## 2013-10-16 ENCOUNTER — Ambulatory Visit (INDEPENDENT_AMBULATORY_CARE_PROVIDER_SITE_OTHER): Payer: BC Managed Care – PPO | Admitting: General Surgery

## 2013-10-16 VITALS — BP 130/84 | HR 92 | Temp 97.1°F | Ht 67.0 in | Wt 216.0 lb

## 2013-10-16 DIAGNOSIS — T8140XA Infection following a procedure, unspecified, initial encounter: Secondary | ICD-10-CM

## 2013-10-16 MED ORDER — AMOXICILLIN-POT CLAVULANATE 875-125 MG PO TABS: 1.0000 | ORAL_TABLET | Freq: Two times a day (BID) | ORAL | Status: DC

## 2013-10-16 NOTE — Progress Notes (Signed)
Subjective:     Patient ID: Shelley Ryan, female   DOB: 11/15/1975, 38 y.o.   MRN: [id number]  HPI The patient is a 38 year old female status post laparoscopic cholecystectomy per Dr. Daphine DeutscherMartin on 10/09/2013. The patient was seen to have acute cholecystitis.the patient was discharged home without any issues.  Patient states that approximately 3 days ago she began having some tenderness and pain as well as some underlying redness to the superior epigastric wound. She states she's had a temperature of 100.5 while at home.  Review of Systems  Constitutional: Negative.   HENT: Negative.   Respiratory: Negative.   Cardiovascular: Negative.   Gastrointestinal: Negative.   Neurological: Negative.   All other systems reviewed and are negative.      Objective:   Physical Exam  Constitutional: She is oriented to person, place, and time. She appears well-developed and well-nourished.  HENT:  Head: Normocephalic and atraumatic.  Eyes: Conjunctivae and EOM are normal. Pupils are equal, round, and reactive to light.  Neck: Normal range of motion. Neck supple.  Cardiovascular: Normal rate, regular rhythm and normal heart sounds.   Pulmonary/Chest: Effort normal and breath sounds normal.  Abdominal:  Epigastric with mild erythema, minimal underlying hematoma. All other laparoscopic port site incisions clean dry and intact.  Musculoskeletal: Normal range of motion.  Neurological: She is alert and oriented to person, place, and time.  Skin: Skin is warm and dry.  Psychiatric: She has a normal mood and affect.       Assessment:     38 year old female status post laparoscopic cholecystectomy. Patient has what looks to be a superficial skin infection at the epigastric port.     Plan:     1. I will give the patient a prescription for Augmentin for 7 days. 2. The patient can followup as scheduled. I asked her to keep an eye on the wound and if there is any further erythema or tenderness the cause for  an earlier clinic visit.

## 2013-10-30 ENCOUNTER — Ambulatory Visit (INDEPENDENT_AMBULATORY_CARE_PROVIDER_SITE_OTHER): Payer: BC Managed Care – PPO | Admitting: General Surgery

## 2013-10-30 ENCOUNTER — Encounter (INDEPENDENT_AMBULATORY_CARE_PROVIDER_SITE_OTHER): Payer: Self-pay

## 2013-10-30 VITALS — Temp 98.6°F | Ht 67.0 in | Wt 217.8 lb

## 2013-10-30 DIAGNOSIS — K8010 Calculus of gallbladder with chronic cholecystitis without obstruction: Secondary | ICD-10-CM

## 2013-10-30 DIAGNOSIS — Z9049 Acquired absence of other specified parts of digestive tract: Secondary | ICD-10-CM

## 2013-10-30 DIAGNOSIS — Z9889 Other specified postprocedural states: Secondary | ICD-10-CM

## 2013-10-30 NOTE — Patient Instructions (Signed)
You may return to normal activities as tolerated.  Follow up as needed.  Thank you for allowing us to be a part of your care. 

## 2013-10-30 NOTE — Progress Notes (Signed)
Shelley Ryan 07/07/1975 [id number] 10/30/2013   Shelley Ryan is a 38 y.o. female who had a laparoscopic cholecystectomy with intraoperative cholangiogram by Dr. Daphine DeutscherMartin.  The pathology report confirmed acute on chronic cholecystitis.  The patient reports that they are feeling well with normal bowel movements and good appetite.  The pre-operative symptoms of abdominal pain and vomiting have resolved.  She complains of fatigue, denies sob, cp or palpitations.  Her incisions are bothering her.  She has completed the antibiotic course for superficial skin infection.    Physical examination - Incisions appear well-healed with no sign of infection or bleeding.   Abdomen - soft, non-tender  Filed Vitals:   10/30/13 1518  Temp: 98.6 F (37 C)     Impression:  s/p laparoscopic cholecystectomy  Plan:  She may resume a regular diet and full activity.  The incisions are healing well.  I was unable to appreciate any sutures.  She may call should they come to surface, but understands that they should resolve over the next few weeks.   She may follow-up on a PRN basis.  Lucillie Kiesel, ANP-BC

## 2014-01-14 ENCOUNTER — Other Ambulatory Visit: Payer: Self-pay

## 2014-01-14 DIAGNOSIS — Z1231 Encounter for screening mammogram for malignant neoplasm of breast: Secondary | ICD-10-CM

## 2014-01-30 ENCOUNTER — Ambulatory Visit
Admission: RE | Admit: 2014-01-30 | Discharge: 2014-01-30 | Disposition: A | Discharge status: Home / Self Care | Payer: BC Managed Care – PPO | Source: Ambulatory Visit

## 2014-01-30 DIAGNOSIS — Z1231 Encounter for screening mammogram for malignant neoplasm of breast: Secondary | ICD-10-CM

## 2014-10-23 ENCOUNTER — Other Ambulatory Visit: Payer: Self-pay | Admitting: Obstetrics and Gynecology

## 2014-10-24 LAB — CYTOLOGY - PAP

## 2014-12-31 ENCOUNTER — Other Ambulatory Visit: Payer: Self-pay

## 2014-12-31 DIAGNOSIS — Z1231 Encounter for screening mammogram for malignant neoplasm of breast: Secondary | ICD-10-CM

## 2015-02-06 ENCOUNTER — Ambulatory Visit: Payer: BC Managed Care – PPO

## 2015-02-20 ENCOUNTER — Ambulatory Visit
Admission: RE | Admit: 2015-02-20 | Discharge: 2015-02-20 | Disposition: A | Discharge status: Home / Self Care | Payer: BC Managed Care – PPO | Source: Ambulatory Visit

## 2015-02-20 DIAGNOSIS — Z1231 Encounter for screening mammogram for malignant neoplasm of breast: Secondary | ICD-10-CM

## 2015-11-24 ENCOUNTER — Other Ambulatory Visit: Payer: Self-pay | Admitting: Family Medicine

## 2015-11-24 DIAGNOSIS — Z8041 Family history of malignant neoplasm of ovary: Secondary | ICD-10-CM

## 2015-11-24 DIAGNOSIS — R1030 Lower abdominal pain, unspecified: Secondary | ICD-10-CM

## 2015-11-25 ENCOUNTER — Ambulatory Visit
Admission: RE | Admit: 2015-11-25 | Discharge: 2015-11-25 | Disposition: A | Discharge status: Home / Self Care | Payer: BC Managed Care – PPO | Source: Ambulatory Visit | Attending: Family Medicine | Admitting: Family Medicine

## 2015-11-25 DIAGNOSIS — Z8041 Family history of malignant neoplasm of ovary: Secondary | ICD-10-CM

## 2015-11-25 DIAGNOSIS — R1030 Lower abdominal pain, unspecified: Secondary | ICD-10-CM

## 2015-11-25 MED ORDER — IOPAMIDOL (ISOVUE-300) INJECTION 61%
100.0000 mL | Freq: Once | INTRAVENOUS | Status: AC | PRN
  Administered 2015-11-25: 100 mL via INTRAVENOUS

## 2016-02-03 ENCOUNTER — Other Ambulatory Visit: Payer: Self-pay | Admitting: Family Medicine

## 2016-02-03 DIAGNOSIS — Z1231 Encounter for screening mammogram for malignant neoplasm of breast: Secondary | ICD-10-CM

## 2016-02-23 ENCOUNTER — Ambulatory Visit
Admission: RE | Admit: 2016-02-23 | Discharge: 2016-02-23 | Disposition: A | Discharge status: Home / Self Care | Payer: BC Managed Care – PPO | Source: Ambulatory Visit | Attending: Family Medicine | Admitting: Family Medicine

## 2016-02-23 DIAGNOSIS — Z1231 Encounter for screening mammogram for malignant neoplasm of breast: Secondary | ICD-10-CM

## 2016-02-25 DIAGNOSIS — K219 Gastro-esophageal reflux disease without esophagitis: Secondary | ICD-10-CM | POA: Insufficient documentation

## 2016-05-17 DIAGNOSIS — Z8489 Family history of other specified conditions: Secondary | ICD-10-CM | POA: Insufficient documentation

## 2017-03-24 ENCOUNTER — Other Ambulatory Visit: Payer: Self-pay | Admitting: Family Medicine

## 2017-03-24 DIAGNOSIS — Z1231 Encounter for screening mammogram for malignant neoplasm of breast: Secondary | ICD-10-CM

## 2017-03-28 ENCOUNTER — Ambulatory Visit
Admission: RE | Admit: 2017-03-28 | Discharge: 2017-03-28 | Disposition: A | Discharge status: Home / Self Care | Payer: BC Managed Care – PPO | Source: Ambulatory Visit | Attending: Family Medicine | Admitting: Family Medicine

## 2017-03-28 DIAGNOSIS — Z1231 Encounter for screening mammogram for malignant neoplasm of breast: Secondary | ICD-10-CM

## 2017-05-31 ENCOUNTER — Other Ambulatory Visit (HOSPITAL_COMMUNITY)
Admission: RE | Admit: 2017-05-31 | Discharge: 2017-05-31 | Disposition: A | Discharge status: Home / Self Care | Payer: BC Managed Care – PPO | Source: Ambulatory Visit | Attending: Family Medicine | Admitting: Family Medicine

## 2017-05-31 ENCOUNTER — Other Ambulatory Visit: Payer: Self-pay | Admitting: Family Medicine

## 2017-05-31 DIAGNOSIS — Z124 Encounter for screening for malignant neoplasm of cervix: Secondary | ICD-10-CM | POA: Diagnosis present

## 2017-06-01 LAB — CYTOLOGY - PAP
Diagnosis: NEGATIVE
HPV: NOT DETECTED

## 2018-04-13 ENCOUNTER — Telehealth: Payer: Self-pay | Admitting: Oncology

## 2018-04-13 ENCOUNTER — Other Ambulatory Visit: Payer: Self-pay | Admitting: Family Medicine

## 2018-04-13 ENCOUNTER — Encounter: Payer: Self-pay | Admitting: Oncology

## 2018-04-13 DIAGNOSIS — Z1231 Encounter for screening mammogram for malignant neoplasm of breast: Secondary | ICD-10-CM

## 2018-04-13 NOTE — Telephone Encounter (Signed)
A new hem appointment has been scheduled for the pt to see Dr. Truett PernaSherrill on 05/25/18 at 2pm. Pt agreed to the appt date and time. Letter mailed.

## 2018-05-02 ENCOUNTER — Encounter: Payer: Self-pay | Admitting: Oncology

## 2018-05-23 ENCOUNTER — Ambulatory Visit
Admission: RE | Admit: 2018-05-23 | Discharge: 2018-05-23 | Disposition: A | Discharge status: Home / Self Care | Payer: BC Managed Care – PPO | Source: Ambulatory Visit | Attending: Family Medicine | Admitting: Family Medicine

## 2018-05-23 DIAGNOSIS — Z1231 Encounter for screening mammogram for malignant neoplasm of breast: Secondary | ICD-10-CM

## 2018-05-25 ENCOUNTER — Inpatient Hospital Stay: Payer: BC Managed Care – PPO | Attending: Oncology | Admitting: Oncology

## 2018-05-25 ENCOUNTER — Other Ambulatory Visit: Payer: Self-pay | Admitting: *Deleted

## 2018-05-25 ENCOUNTER — Inpatient Hospital Stay: Payer: BC Managed Care – PPO

## 2018-05-25 ENCOUNTER — Telehealth: Payer: Self-pay | Admitting: Oncology

## 2018-05-25 VITALS — BP 145/100 | HR 85 | Temp 98.4°F | Resp 18 | Ht 67.0 in | Wt 254.2 lb

## 2018-05-25 DIAGNOSIS — Z86718 Personal history of other venous thrombosis and embolism: Secondary | ICD-10-CM | POA: Diagnosis not present

## 2018-05-25 DIAGNOSIS — Z808 Family history of malignant neoplasm of other organs or systems: Secondary | ICD-10-CM

## 2018-05-25 DIAGNOSIS — K3184 Gastroparesis: Secondary | ICD-10-CM | POA: Diagnosis not present

## 2018-05-25 DIAGNOSIS — D72829 Elevated white blood cell count, unspecified: Secondary | ICD-10-CM

## 2018-05-25 DIAGNOSIS — Z8673 Personal history of transient ischemic attack (TIA), and cerebral infarction without residual deficits: Secondary | ICD-10-CM | POA: Insufficient documentation

## 2018-05-25 DIAGNOSIS — R030 Elevated blood-pressure reading, without diagnosis of hypertension: Secondary | ICD-10-CM | POA: Insufficient documentation

## 2018-05-25 DIAGNOSIS — M1380 Other specified arthritis, unspecified site: Secondary | ICD-10-CM

## 2018-05-25 DIAGNOSIS — D72828 Other elevated white blood cell count: Secondary | ICD-10-CM | POA: Diagnosis not present

## 2018-05-25 DIAGNOSIS — Z8041 Family history of malignant neoplasm of ovary: Secondary | ICD-10-CM

## 2018-05-25 LAB — CBC WITH DIFFERENTIAL (CANCER CENTER ONLY)
Abs Immature Granulocytes: 0.05 10*3/uL (ref 0.00–0.07)
Basophils Absolute: 0 10*3/uL (ref 0.0–0.1)
Basophils Relative: 0 %
EOS PCT: 1 %
Eosinophils Absolute: 0.1 10*3/uL (ref 0.0–0.5)
HCT: 40.7 % (ref 36.0–46.0)
Hemoglobin: 13.6 g/dL (ref 12.0–15.0)
Immature Granulocytes: 0 %
Lymphocytes Relative: 11 %
Lymphs Abs: 1.4 10*3/uL (ref 0.7–4.0)
MCH: 29.2 pg (ref 26.0–34.0)
MCHC: 33.4 g/dL (ref 30.0–36.0)
MCV: 87.5 fL (ref 80.0–100.0)
Monocytes Absolute: 0.5 10*3/uL (ref 0.1–1.0)
Monocytes Relative: 4 %
Neutro Abs: 10.9 10*3/uL — ABNORMAL HIGH (ref 1.7–7.7)
Neutrophils Relative %: 84 %
Platelet Count: 249 10*3/uL (ref 150–400)
RBC: 4.65 MIL/uL (ref 3.87–5.11)
RDW: 13.4 % (ref 11.5–15.5)
WBC Count: 12.9 10*3/uL — ABNORMAL HIGH (ref 4.0–10.5)
nRBC: 0 % (ref 0.0–0.2)

## 2018-05-25 LAB — SAVE SMEAR(SSMR), FOR PROVIDER SLIDE REVIEW

## 2018-05-25 NOTE — Patient Instructions (Signed)
Please bring a copy of your Advanced Directive/Living Will to have scanned into record

## 2018-05-25 NOTE — Telephone Encounter (Signed)
Scheduled appt per 01/30 los.

## 2018-05-25 NOTE — Progress Notes (Signed)
[facility] New Patient Consult   Requesting DE:YCXKG Muzaffar  Shelley Ryan 43 y.o.  Jul 14, 1975    Reason for Consult: Leukocytosis   HPI: Shelley Ryan is followed by rheumatology for an undifferentiated inflammatory arthritis.  She has been noted to have a mildly elevated neutrophil count on multiple occasions including a CBC on 03/31/2018 when the white count returned at 13.1 with an absolute neutrophil count of 10.2.  The hemoglobin was measured at 13.8, MCV 87, and platelets 256,000.  She is completing a prednisone taper for management of arthritis, but reports the white count was elevated prior to beginning prednisone.  She is taking indomethacin daily.  The arthritis has improved.  Review of laboratory data in the [facility] system reveals a white count of 14.4 with an absolute neutrophil count of 11.9 in June 2015.  No recent infection.  Past Medical History:  Diagnosis Date  . BRCA1 negative 10/08/2013  . CVA (cerebral infarction) 2002   Most likely from Hormonal Tx  . Dural sinus thrombosis 2002  . PCOS (polycystic ovarian syndrome)    stroke on OCP hormonal therapy    .  G1, P1   .  Undifferentiated inflammatory arthritis   .  Gastroparesis  Past Surgical History:  Procedure Laterality Date  . APPENDECTOMY    . CHOLECYSTECTOMY N/A 10/09/2013   Procedure: LAPAROSCOPIC CHOLECYSTECTOMY WITH INTRAOPERATIVE CHOLANGIOGRAM;  Surgeon: Pedro Earls, MD;  Location: [facility];  Service: General;  Laterality: N/A;  . KNEE ARTHROSCOPY Bilateral   . TYMPANOSTOMY TUBE PLACEMENT     x 2  . WISDOM TOOTH EXTRACTION      .  D&C   .  Tubal ligation   .  Pyloromyotomy   .  Levonorgestrel intrauterine device  Medications: Reviewed  Allergies:  Allergies  Allergen Reactions  . Biaxin [Clarithromycin]     Acute renal failure  . Cisapride Other (See Comments)    Cannot tolerate it  . Estrogens Other (See Comments)    Other reaction(s): Other Pt. States has a  stroke Pt. States has a stroke Blood clots   . Metronidazole Diarrhea    vomiting   . Minocycline Hcl Nausea And Vomiting and Other (See Comments)    Migraine Migraine   . Cholestyramine Nausea And Vomiting  . Keflex [Cephalexin] Nausea And Vomiting    Tolerates rocephin fine  . Reglan [Metoclopramide] Other (See Comments)    "doesn't act like her self"  . Tetracyclines & Related Other (See Comments)    Migraine   . Unasyn [Ampicillin-Sulbactam Sodium] Nausea And Vomiting  . Ciprofloxacin Rash  . Codeine Rash    Tolerates tramadol fine  . Tape Rash    Can only use paper tape    Family history: Ovarian cancer-maternal grandmother, endometrial cancer, paternal grandmother  Social History:   She lives with her husband and daughter in [place].  She works as a Radiographer, therapeutic.  She does not smoke cigarettes.  She reports a history of occasional alcohol use.  None at present.  No transfusion history.  No risk factor for HIV or hepatitis.  ROS:   Positives include: She has measured a temperature of 99 point 5 in the evening, nausea a few days per week, diffuse joint pain and swelling-improved with indomethacin and prednisone, malar rash, dry eyes, daily headache, left-sided increased tone and hyperreflexia following the CVA in 2003, intermittent abdominal wall and leg cramps at night, central visual defect following the 2003 CVA  A complete ROS was otherwise negative.  Physical Exam:  Blood pressure (!) 145/100, pulse 85, temperature 98.4 F (36.9 C), temperature source Oral, resp. rate 18, height '5\' 7"'  (1.702 m), weight 254 lb 3.2 oz (115.3 kg), SpO2 98 %.  HEENT: Oropharynx without visible mass, neck without mass Lungs: Clear bilaterally Cardiac: Regular rate and rhythm Abdomen: No hepatosplenomegaly, mild tenderness in the left abdomen, no mass  Vascular: No leg edema Lymph nodes: No cervical, supraclavicular, axillary, or inguinal nodes Neurologic:  Alert and oriented, the motor exam appears intact in the upper and lower extremities bilaterally Skin: Mild malar rash Musculoskeletal: Mild tenderness at the low back, no swelling or erythema at the elbows, hand joints, wrists, or knees   LAB:  CBC  Lab Results  Component Value Date   WBC 12.9 (H) 05/25/2018   HGB 13.6 05/25/2018   HCT 40.7 05/25/2018   MCV 87.5 05/25/2018   PLT 249 05/25/2018   NEUTROABS 10.9 (H) 05/25/2018    Blood smear: The red cell morphology is unremarkable.  The platelets appear normal in number, occasional large platelets.  The majority of the white cells are mature neutrophils.  There are lymphocytes and monocytes.  No young forms or blasts.       Assessment/Plan:   1. Neutrophilia-chronic  2. Undifferentiated inflammatory arthritis 3. Gastroparesis-status post Botox injections and pyloromyotomy 4. 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  The neutrophilia appears chronic.  The neutrophilia is most likely related to the inflammatory arthritis.  I cannot relate the elevated neutrophil count to her medical regimen.  She does not appear to have a chronic infection.  The peripheral blood smear is not suggestive of a myeloproliferative disorder.  I do not recommend further diagnostic evaluation at present.  She will return for an office visit and repeat CBC in 6 months.  Her blood pressure is elevated today.  I recommended she follow-up with her primary physician for evaluation of hypertension.  She has a levonorgestrel intrauterine device in place.  She understands the potential increase risk of other stroke with hormonal contraception.  She says she has discussed this with her gynecologist and decided to have the intrauterine device placed.  Betsy Coder, MD  05/25/2018, 5:20 PM

## 2018-11-23 ENCOUNTER — Inpatient Hospital Stay: Payer: BC Managed Care – PPO | Admitting: Oncology

## 2018-11-23 ENCOUNTER — Other Ambulatory Visit: Payer: Self-pay

## 2018-11-23 ENCOUNTER — Inpatient Hospital Stay: Payer: BC Managed Care – PPO | Attending: Oncology

## 2018-11-23 VITALS — BP 138/84 | HR 88 | Temp 98.0°F | Resp 18 | Ht 67.0 in | Wt 248.1 lb

## 2018-11-23 DIAGNOSIS — D72828 Other elevated white blood cell count: Secondary | ICD-10-CM

## 2018-11-23 DIAGNOSIS — D72829 Elevated white blood cell count, unspecified: Secondary | ICD-10-CM

## 2018-11-23 DIAGNOSIS — R109 Unspecified abdominal pain: Secondary | ICD-10-CM | POA: Diagnosis not present

## 2018-11-23 DIAGNOSIS — G8929 Other chronic pain: Secondary | ICD-10-CM | POA: Diagnosis not present

## 2018-11-23 DIAGNOSIS — M1990 Unspecified osteoarthritis, unspecified site: Secondary | ICD-10-CM | POA: Diagnosis not present

## 2018-11-23 LAB — CBC WITH DIFFERENTIAL (CANCER CENTER ONLY)
Abs Immature Granulocytes: 0.05 10*3/uL (ref 0.00–0.07)
Basophils Absolute: 0.1 10*3/uL (ref 0.0–0.1)
Basophils Relative: 1 %
Eosinophils Absolute: 0.1 10*3/uL (ref 0.0–0.5)
Eosinophils Relative: 1 %
HCT: 43.3 % (ref 36.0–46.0)
Hemoglobin: 14.5 g/dL (ref 12.0–15.0)
Immature Granulocytes: 0 %
Lymphocytes Relative: 20 %
Lymphs Abs: 2.5 10*3/uL (ref 0.7–4.0)
MCH: 29.5 pg (ref 26.0–34.0)
MCHC: 33.5 g/dL (ref 30.0–36.0)
MCV: 88.2 fL (ref 80.0–100.0)
Monocytes Absolute: 0.7 10*3/uL (ref 0.1–1.0)
Monocytes Relative: 5 %
Neutro Abs: 9 10*3/uL — ABNORMAL HIGH (ref 1.7–7.7)
Neutrophils Relative %: 73 %
Platelet Count: 284 10*3/uL (ref 150–400)
RBC: 4.91 MIL/uL (ref 3.87–5.11)
RDW: 13.1 % (ref 11.5–15.5)
WBC Count: 12.4 10*3/uL — ABNORMAL HIGH (ref 4.0–10.5)
nRBC: 0 % (ref 0.0–0.2)

## 2018-11-23 NOTE — Progress Notes (Signed)
  [facility] OFFICE PROGRESS NOTE   Diagnosis: Neutrophilia  INTERVAL HISTORY:   Ms. Heckman returns as scheduled.  She has intermittent arthritis and chronic abdominal pain.  She is being treated for gastroparesis.  No steroid use.  No infection.  No bleeding or thrombosis.  Objective:  Vital signs in last 24 hours:  Blood pressure 138/84, pulse 88, temperature 98 F (36.7 C), temperature source Oral, resp. rate 18, height 5\' 7"  (1.702 m), weight 248 lb 1.6 oz (112.5 kg), SpO2 95 %.    GI: No hepatosplenomegaly, mild diffuse tenderness, no mass Vascular: No leg edema Musculoskeletal: No erythema or edema at the hand joints    Lab Results:  Lab Results  Component Value Date   WBC 12.4 (H) 11/23/2018   HGB 14.5 11/23/2018   HCT 43.3 11/23/2018   MCV 88.2 11/23/2018   PLT 284 11/23/2018   NEUTROABS 9.0 (H) 11/23/2018     Medications: I have reviewed the patient's current medications.   Assessment/Plan: 1. Neutrophilia-chronic  2. Undifferentiated inflammatory arthritis 3. Gastroparesis-status post Botox injections and pyloromyotomy 4. 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  She does not have symptoms or hematologic findings to suggest a myeloproliferative disorder.  I suspect the neutrophilia is a benign normal variant or associated with the inflammatory arthritis.  She plans to continue follow-up with Dr. Sabra Heck and rheumatology.  She will have her physicians contact me if she develops a progressive rise in the white count or new hematologic findings.  I am available to see her in the future as needed.  Betsy Coder, MD  11/23/2018  1:25 PM

## 2019-01-05 DIAGNOSIS — H31013 Macula scars of posterior pole (postinflammatory) (post-traumatic), bilateral: Secondary | ICD-10-CM | POA: Insufficient documentation

## 2019-01-05 DIAGNOSIS — H5213 Myopia, bilateral: Secondary | ICD-10-CM | POA: Insufficient documentation

## 2019-01-05 DIAGNOSIS — H43813 Vitreous degeneration, bilateral: Secondary | ICD-10-CM | POA: Insufficient documentation

## 2019-01-05 DIAGNOSIS — H40003 Preglaucoma, unspecified, bilateral: Secondary | ICD-10-CM | POA: Insufficient documentation

## 2019-01-05 DIAGNOSIS — H01009 Unspecified blepharitis unspecified eye, unspecified eyelid: Secondary | ICD-10-CM | POA: Insufficient documentation

## 2019-06-11 ENCOUNTER — Emergency Department (HOSPITAL_COMMUNITY): Payer: BC Managed Care – PPO

## 2019-06-11 ENCOUNTER — Emergency Department (HOSPITAL_COMMUNITY)
Admission: EM | Admit: 2019-06-11 | Discharge: 2019-06-11 | Disposition: A | Discharge status: Home / Self Care | Payer: BC Managed Care – PPO | Attending: Emergency Medicine | Admitting: Emergency Medicine

## 2019-06-11 DIAGNOSIS — I10 Essential (primary) hypertension: Secondary | ICD-10-CM | POA: Diagnosis not present

## 2019-06-11 DIAGNOSIS — R200 Anesthesia of skin: Secondary | ICD-10-CM | POA: Diagnosis not present

## 2019-06-11 DIAGNOSIS — R4781 Slurred speech: Secondary | ICD-10-CM | POA: Insufficient documentation

## 2019-06-11 DIAGNOSIS — H538 Other visual disturbances: Secondary | ICD-10-CM | POA: Diagnosis not present

## 2019-06-11 DIAGNOSIS — R202 Paresthesia of skin: Secondary | ICD-10-CM | POA: Diagnosis not present

## 2019-06-11 DIAGNOSIS — Z79899 Other long term (current) drug therapy: Secondary | ICD-10-CM | POA: Diagnosis not present

## 2019-06-11 DIAGNOSIS — Z8673 Personal history of transient ischemic attack (TIA), and cerebral infarction without residual deficits: Secondary | ICD-10-CM | POA: Diagnosis not present

## 2019-06-11 DIAGNOSIS — R519 Headache, unspecified: Secondary | ICD-10-CM | POA: Diagnosis present

## 2019-06-11 DIAGNOSIS — R531 Weakness: Secondary | ICD-10-CM | POA: Insufficient documentation

## 2019-06-11 LAB — URINALYSIS, ROUTINE W REFLEX MICROSCOPIC
Bilirubin Urine: NEGATIVE
Glucose, UA: NEGATIVE mg/dL
Ketones, ur: NEGATIVE mg/dL
Leukocytes,Ua: NEGATIVE
Nitrite: NEGATIVE
Protein, ur: NEGATIVE mg/dL
Specific Gravity, Urine: 1.021 (ref 1.005–1.030)
pH: 5 (ref 5.0–8.0)

## 2019-06-11 LAB — CBC
HCT: 47.1 % — ABNORMAL HIGH (ref 36.0–46.0)
Hemoglobin: 15.9 g/dL — ABNORMAL HIGH (ref 12.0–15.0)
MCH: 29.6 pg (ref 26.0–34.0)
MCHC: 33.8 g/dL (ref 30.0–36.0)
MCV: 87.7 fL (ref 80.0–100.0)
Platelets: 264 10*3/uL (ref 150–400)
RBC: 5.37 MIL/uL — ABNORMAL HIGH (ref 3.87–5.11)
RDW: 12.9 % (ref 11.5–15.5)
WBC: 9.8 10*3/uL (ref 4.0–10.5)
nRBC: 0 % (ref 0.0–0.2)

## 2019-06-11 LAB — DIFFERENTIAL
Abs Immature Granulocytes: 0.05 10*3/uL (ref 0.00–0.07)
Basophils Absolute: 0.1 10*3/uL (ref 0.0–0.1)
Basophils Relative: 1 %
Eosinophils Absolute: 0.1 10*3/uL (ref 0.0–0.5)
Eosinophils Relative: 1 %
Immature Granulocytes: 1 %
Lymphocytes Relative: 27 %
Lymphs Abs: 2.6 10*3/uL (ref 0.7–4.0)
Monocytes Absolute: 0.5 10*3/uL (ref 0.1–1.0)
Monocytes Relative: 5 %
Neutro Abs: 6.4 10*3/uL (ref 1.7–7.7)
Neutrophils Relative %: 65 %

## 2019-06-11 LAB — COMPREHENSIVE METABOLIC PANEL
ALT: 31 U/L (ref 0–44)
AST: 33 U/L (ref 15–41)
Albumin: 4.2 g/dL (ref 3.5–5.0)
Alkaline Phosphatase: 81 U/L (ref 38–126)
Anion gap: 14 (ref 5–15)
BUN: 12 mg/dL (ref 6–20)
CO2: 22 mmol/L (ref 22–32)
Calcium: 9.5 mg/dL (ref 8.9–10.3)
Chloride: 101 mmol/L (ref 98–111)
Creatinine, Ser: 1.12 mg/dL — ABNORMAL HIGH (ref 0.44–1.00)
GFR calc Af Amer: >60 mL/min (ref >60)
GFR calc non Af Amer: 59 mL/min — ABNORMAL LOW (ref >60)
Glucose, Bld: 110 mg/dL — ABNORMAL HIGH (ref 70–99)
Potassium: 4.1 mmol/L (ref 3.5–5.1)
Sodium: 137 mmol/L (ref 135–145)
Total Bilirubin: 0.7 mg/dL (ref 0.3–1.2)
Total Protein: 7.7 g/dL (ref 6.5–8.1)

## 2019-06-11 LAB — RAPID URINE DRUG SCREEN, HOSP PERFORMED
Amphetamines: NOT DETECTED
Barbiturates: NOT DETECTED
Benzodiazepines: NOT DETECTED
Cocaine: NOT DETECTED
Opiates: NOT DETECTED
Tetrahydrocannabinol: NOT DETECTED

## 2019-06-11 LAB — I-STAT BETA HCG BLOOD, ED (MC, WL, AP ONLY)
I-stat hCG, quantitative: <5 m[IU]/mL (ref <5)
I-stat hCG, quantitative: <5 m[IU]/mL (ref <5)

## 2019-06-11 LAB — PROTIME-INR
INR: 0.9 (ref 0.8–1.2)
Prothrombin Time: 12.4 seconds (ref 11.4–15.2)

## 2019-06-11 LAB — APTT: aPTT: 30 seconds (ref 24–36)

## 2019-06-11 MED ORDER — KETOROLAC TROMETHAMINE 30 MG/ML IJ SOLN
30.0000 mg | Freq: Once | INTRAMUSCULAR | Status: AC
  Administered 2019-06-11: 30 mg via INTRAVENOUS
  Filled 2019-06-11: qty 1

## 2019-06-11 MED ORDER — PROMETHAZINE HCL 25 MG/ML IJ SOLN
12.5000 mg | Freq: Once | INTRAMUSCULAR | Status: AC
  Administered 2019-06-11: 12.5 mg via INTRAVENOUS
  Filled 2019-06-11: qty 1

## 2019-06-11 MED ORDER — GADOBUTROL 1 MMOL/ML IV SOLN
10.0000 mL | Freq: Once | INTRAVENOUS | Status: AC | PRN
  Administered 2019-06-11: 10 mL via INTRAVENOUS

## 2019-06-11 NOTE — Consult Note (Signed)
Neurology Consultation Reason for Consult: Left sided weakness Referring Physician: Stark Jock, D  CC: Left sided weakness  History is obtained from:Patient   HPI: Shelley Ryan is a 44 y.o. female with a history of previous venous sinus thrombosis with secondary stroke in 2003 who presents with new left sided numbness/weakness associated with headache. She states that initially she noticed flashing lights in both eyes which remained present when she closed her eyes.  Subsequently, she noticed headache which was right sided unilateral centered just behind her right eye.  She then noticed tingling in her left arm and face and they felt numb.  She then noticed some weakness and therefore presented to the emergency department where code stroke was activated.  LKW: 4 PM tpa given?: no, mild symptoms   ROS: A 14 point ROS was performed and is negative except as noted in the HPI.   Past Medical History:  Diagnosis Date  . BRCA1 negative 10/08/2013  . CVA (cerebral infarction) 2002   Most likely from Hormonal Tx  . Dural sinus thrombosis 2002  . PCOS (polycystic ovarian syndrome)    stroke on OCP hormonal therapy     Family History  Problem Relation Age of Onset  . Breast cancer Other        Great Grandmother   . Breast cancer Other      Social History:  reports that she has never smoked. She has never used smokeless tobacco. She reports that she does not drink alcohol or use drugs.   Exam: Current vital signs: BP (!) 202/144   Pulse (!) 109   Temp 98.5 F (36.9 C) (Oral)   Resp (!) 21   SpO2 96%  Vital signs in last 24 hours: Temp:  [98.5 F (36.9 C)] 98.5 F (36.9 C) (02/15 1745) Pulse Rate:  [100-109] 109 (02/15 1815) Resp:  [18-21] 21 (02/15 1815) BP: (187-202)/(106-144) 202/144 (02/15 1800) SpO2:  [96 %-98 %] 96 % (02/15 1815)   Physical Exam  Constitutional: Appears well-developed and well-nourished.  Psych: Affect appropriate to situation Eyes: No scleral  injection HENT: No OP obstrucion MSK: no joint deformities.  Cardiovascular: Normal rate and regular rhythm.  Respiratory: Effort normal, non-labored breathing GI: Soft.  No distension. There is no tenderness.  Skin: WDI  Neuro: Mental Status: Patient is awake, alert, oriented to person, place, month, year, and situation. Patient is able to give a clear and coherent history. No signs of aphasia or neglect Cranial Nerves: II: Visual Fields are full. Pupils are equal, round, and reactive to light.   III,IV, VI: EOMI without ptosis or diploplia.  V: Facial sensation is diminished on the left VII: Facial movement is symmetric.  VIII: hearing is intact to voice X: Uvula elevates symmetrically XI: Shoulder shrug is symmetric. XII: tongue is midline without atrophy or fasciculations.  Motor: Tone is normal. Bulk is normal. 5/5 strength was present on the right, she has an inconsistent exam on the left with giveaway weakness of the left arm and leg.  She is able to move herself from stretcher to CT table with good strength in the left arm Sensory: Sensation is diminished in the left arm Cerebellar: No ataxia on finger-nose-finger (or evidence of weakness when performing finger-nose-finger on the left)   I have reviewed labs in epic and the results pertinent to this consultation are: CBC-unremarkable  I have reviewed the images obtained: CT head-negative  Impression: 44 year old female with left-sided weakness and tingling in the setting of retro-orbital headache.  I suspect that this most likely represents complicated migraine, though there are some nonphysiological findings on her exam (give way weakness and inconsistent exam.)  I do think given her history that an MRI would be prudent, but if this is negative, then I would have no further recommendations.  Recommendations: 1) MRI/MRA/MRV 2) if negative, could consider treating as complicated migraine   Roland Rack,  MD [facility] [phone]  If 7pm- 7am, please page neurology on call as listed in [place].

## 2019-06-11 NOTE — ED Notes (Signed)
meds given per mar. Name/dob verified with pt

## 2019-06-11 NOTE — ED Notes (Signed)
Assumed care of pt. Pt alert on cart. Ambulatory to and from bathroom with strong, steady gait. Pt endorses vertigo while ambulating. Urine collected, labeled with 2 pt identifiers, and sent to lab. Pt taken to MRI

## 2019-06-11 NOTE — ED Notes (Signed)
Pt alert, resting on cart in NAD. Breathing easy, non-labored. Vss. Call light within reach. Hourly rounds completed. Will continue to monitor

## 2019-06-11 NOTE — ED Notes (Signed)
Pt back from MRI without incidence 

## 2019-06-11 NOTE — ED Triage Notes (Signed)
Pt presents to the ED by EMS for a possible TIA. Pt reports a hx of stroke in 2003. Pt complains of left sided weakness and right hand tingling. Pt complains of headache with hypertension. Sharp pain to forehead with dull pain in the back of her head. Pt reports that this feels like her prior stroke. LKW 1614

## 2019-06-11 NOTE — ED Notes (Signed)
Patient verbalizes understanding of discharge instructions. Opportunity for questioning and answers were provided. All questions answered completely. PIV removed, catheters intact. Sites dressed with gauze and tape. Armband removed by staff, pt discharged from ED. Ambulatory with strong, steady gait

## 2019-06-11 NOTE — ED Provider Notes (Signed)
[place] EMERGENC[facility] DEPARTMENT Provider Note   CSN: [id number] Arrival date & time: 06/11/19  1742     History No chief complaint on file.   Shelley Ryan is a 44 y.o. female.  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  Patient presents today with complaints of headache, blurred vision, slurred speech, and left arm numbness that began today at approximately 4 PM while on a Zoom call.  Her speech has improved, but vision remains blurry and left arm remains numb.  She continues with pressure to the front of her head.  Patient transported here by EMS for evaluation of the symptoms.  She denies any fevers or chills.  She denies any recent illnesses.  The history is provided by the patient.       Past Medical History:  Diagnosis Date  . BRCA1 negative 10/08/2013  . CVA (cerebral infarction) 2002   Most likely from Hormonal Tx  . Dural sinus thrombosis 2002  . PCOS (polycystic ovarian syndrome)    stroke on OCP hormonal therapy    Patient Active Problem List   Diagnosis Date Noted  . Chronic cholecystitis with calculus 10/30/2013  . Acute cholecystitis 10/10/2013  . Status post laparoscopic cholecystectomy June 2015 10/09/2013  . Obesity (BMI 30-39.9) 10/08/2013  . PCOS (polycystic ovarian syndrome)     Past Surgical History:  Procedure Laterality Date  . APPENDECTOMY    . CHOLECYSTECTOMY N/A 10/09/2013   Procedure: LAPAROSCOPIC CHOLECYSTECTOMY WITH INTRAOPERATIVE CHOLANGIOGRAM;  Surgeon: Pedro Earls, MD;  Location: [facility];  Service: General;  Laterality: N/A;  . KNEE ARTHROSCOPY Bilateral   . TYMPANOSTOMY TUBE PLACEMENT     x 2  . WISDOM TOOTH EXTRACTION       OB History   No obstetric history on file.     Family History  Problem Relation Age of Onset  . Breast cancer Other        Great Grandmother   . Breast cancer Other     Social  History   Tobacco Use  . Smoking status: Never Smoker  . Smokeless tobacco: Never Used  Substance Use Topics  . Alcohol use: No  . Drug use: No    Home Medications Prior to Admission medications   Medication Sig Start Date End Date Taking? Authorizing Provider  acetaminophen (TYLENOL) 325 MG tablet Take 650 mg by mouth every 6 (six) hours as needed.    [provider]  amitriptyline (ELAVIL) 25 MG tablet Take 25 mg by mouth at bedtime. 08/09/18   [provider]  Cholecalciferol (VITAMIN D-1000 MAX ST) 25 MCG (1000 UT) tablet Take 1,000 Units by mouth daily.    [provider]  cimetidine (TAGAMET) 400 MG tablet Take 400 mg by mouth 2 (two) times a day. 08/28/18   [provider]  dicyclomine (BENTYL) 10 MG capsule Take 10 mg by mouth 3 (three) times daily as needed. 08/09/18   [provider]  Levonorgestrel 19.5 MG IUD by Intrauterine route.    [provider]  metFORMIN (GLUCOPHAGE) 500 MG tablet Take 500 mg by mouth 2 (two) times a day. 07/10/18   [provider]  Multiple Vitamins-Minerals (MULTIVITAMIN WITH MINERALS) tablet Take 1 tablet by mouth daily.    [provider]  prochlorperazine (COMPAZINE) 10 MG tablet Take 10 mg by mouth every 6 (six) hours as needed. 12/22/15   [provider]  promethazine (PHENERGAN) 25 MG suppository Place 25 mg rectally every 6 (six) hours as needed.    [provider]    Allergies    Biaxin [clarithromycin], Cisapride, Estrogens, Metronidazole, Minocycline hcl, Cholestyramine, Keflex [cephalexin], Reglan [metoclopramide], Tetracyclines & related, Unasyn [ampicillin-sulbactam sodium], Ciprofloxacin, Codeine, and Tape  Review of Systems   Review of Systems  All other systems reviewed and are negative.   Physical Exam Updated Vital Signs There were no vitals taken for this visit.  Physical Exam Vitals and nursing note reviewed.  Constitutional:       General: She is not in acute distress.    Appearance: She is well-developed. She is not diaphoretic.  HENT:     Head: Normocephalic and atraumatic.  Eyes:     Extraocular Movements: Extraocular movements intact.     Pupils: Pupils are equal, round, and reactive to light.  Cardiovascular:     Rate and Rhythm: Normal rate and regular rhythm.     Heart sounds: No murmur. No friction rub. No gallop.   Pulmonary:     Effort: Pulmonary effort is normal. No respiratory distress.     Breath sounds: Normal breath sounds. No wheezing.  Abdominal:     General: Bowel sounds are normal. There is no distension.     Palpations: Abdomen is soft.     Tenderness: There is no abdominal tenderness.  Musculoskeletal:        General: Normal range of motion.     Cervical back: Normal range of motion and neck supple.  Skin:    General: Skin is warm and dry.  Neurological:     Mental Status: She is alert and oriented to person, place, and time.     Cranial Nerves: No cranial nerve deficit.     Sensory: No sensory deficit.     Motor: Weakness present.     Coordination: Coordination normal.     Comments: Patient does have mildly decreased strength in hand grip and bicep flexion of the left arm.     ED Results / Procedures / Treatments   Labs (all labs ordered are listed, but only abnormal results are displayed) Labs Reviewed - No data to display  EKG EKG Interpretation  Date/Time:  Monday June 11 2019 17:47:42 EST Ventricular Rate:  102 PR Interval:    QRS Duration: 92 QT Interval:  348 QTC Calculation: 382 R Axis:   15 Text Interpretation: Sinus tachycardia Low voltage, precordial leads Consider inferior infarct Consider anterior infarct No prior ecg for comparison Confirmed by Veryl Speak [phone]) on 06/11/2019 5:51:03 PM   Radiology No results found.  Procedures Procedures (including critical care time)  Medications Ordered in ED Medications - No data to display  ED Course  I  have reviewed the triage vital signs and the nursing notes.  Pertinent labs & imaging results that were available during my care of the patient were reviewed by me and considered in my medical decision making (see chart for details).    MDM Rules/Calculators/A&P  Patient is a 44 year old female with history of prior dural venous thrombosis/CVA presenting with complaints of left arm weakness, blurry vision, difficulty with speech, and headache.  This began while she was on a Zoom call.  Symptoms began at 4:00 and patient arrived here shortly after 6:00.  Due to the timing/onset, a code stroke was initiated and patient was seen by Dr. Leonel Ramsay and sent for an emergent CT scan of the head.  This returned as negative.  Additional imaging  studies including MRI of the brain, MRA of the head and neck, and MRV of the head were also obtained.  These were all also unremarkable.  Patient's laboratory studies are all within normal limits.  I am uncertain as to the exact etiology of the symptoms, however I suspect a complex migraine.  At this point, patient will be given Toradol and Phenergan and discharged to home.  She is to follow-up with primary doctor and return if symptoms worsen or change.  CRITICAL CARE Performed by: Veryl Speak Total critical care time: 35 minutes Critical care time was exclusive of separately billable procedures and treating other patients. Critical care was necessary to treat or prevent imminent or life-threatening deterioration. Critical care was time spent personally by me on the following activities: development of treatment plan with patient and/or surrogate as well as nursing, discussions with consultants, evaluation of patient's response to treatment, examination of patient, obtaining history from patient or surrogate, ordering and performing treatments and interventions, ordering and review of laboratory studies, ordering and review of radiographic studies, pulse oximetry  and re-evaluation of patient's condition.   Final Clinical Impression(s) / ED Diagnoses Final diagnoses:  None    Rx / DC Orders ED Discharge Orders    None       Veryl Speak, MD 06/11/19 2229

## 2019-06-11 NOTE — Discharge Instructions (Signed)
Take Tylenol 1000 mg rotated with ibuprofen 600 mg every 4 hours as needed.  Follow-up with your primary doctor if symptoms or not improving in the next few days, and return to the ER if symptoms significantly worsen or change.

## 2019-06-13 ENCOUNTER — Emergency Department (HOSPITAL_BASED_OUTPATIENT_CLINIC_OR_DEPARTMENT_OTHER)
Admission: EM | Admit: 2019-06-13 | Discharge: 2019-06-13 | Disposition: A | Discharge status: Home / Self Care | Payer: BC Managed Care – PPO | Attending: Emergency Medicine | Admitting: Emergency Medicine

## 2019-06-13 ENCOUNTER — Other Ambulatory Visit: Payer: Self-pay

## 2019-06-13 ENCOUNTER — Encounter (HOSPITAL_BASED_OUTPATIENT_CLINIC_OR_DEPARTMENT_OTHER): Payer: Self-pay | Admitting: *Deleted

## 2019-06-13 DIAGNOSIS — G43909 Migraine, unspecified, not intractable, without status migrainosus: Secondary | ICD-10-CM | POA: Diagnosis not present

## 2019-06-13 DIAGNOSIS — Z881 Allergy status to other antibiotic agents status: Secondary | ICD-10-CM | POA: Diagnosis not present

## 2019-06-13 DIAGNOSIS — Z888 Allergy status to other drugs, medicaments and biological substances status: Secondary | ICD-10-CM | POA: Insufficient documentation

## 2019-06-13 DIAGNOSIS — Z79899 Other long term (current) drug therapy: Secondary | ICD-10-CM | POA: Insufficient documentation

## 2019-06-13 DIAGNOSIS — Z7984 Long term (current) use of oral hypoglycemic drugs: Secondary | ICD-10-CM | POA: Insufficient documentation

## 2019-06-13 DIAGNOSIS — I10 Essential (primary) hypertension: Secondary | ICD-10-CM | POA: Insufficient documentation

## 2019-06-13 DIAGNOSIS — Z20822 Contact with and (suspected) exposure to covid-19: Secondary | ICD-10-CM | POA: Diagnosis not present

## 2019-06-13 DIAGNOSIS — Z8673 Personal history of transient ischemic attack (TIA), and cerebral infarction without residual deficits: Secondary | ICD-10-CM | POA: Insufficient documentation

## 2019-06-13 DIAGNOSIS — Z885 Allergy status to narcotic agent status: Secondary | ICD-10-CM | POA: Insufficient documentation

## 2019-06-13 DIAGNOSIS — Z880 Allergy status to penicillin: Secondary | ICD-10-CM | POA: Insufficient documentation

## 2019-06-13 DIAGNOSIS — R519 Headache, unspecified: Secondary | ICD-10-CM | POA: Diagnosis present

## 2019-06-13 LAB — RESPIRATORY PANEL BY RT PCR (FLU A&B, COVID)
Influenza A by PCR: NEGATIVE
Influenza B by PCR: NEGATIVE
SARS Coronavirus 2 by RT PCR: NEGATIVE

## 2019-06-13 MED ORDER — AMLODIPINE BESYLATE 5 MG PO TABS: 5.0000 mg | ORAL_TABLET | Freq: Every day | ORAL | 0 refills | Status: DC

## 2019-06-13 MED ORDER — KETOROLAC TROMETHAMINE 15 MG/ML IJ SOLN
15.0000 mg | Freq: Once | INTRAMUSCULAR | Status: AC
  Administered 2019-06-13: 15 mg via INTRAVENOUS
  Filled 2019-06-13: qty 1

## 2019-06-13 MED ORDER — DIPHENHYDRAMINE HCL 50 MG/ML IJ SOLN
25.0000 mg | Freq: Once | INTRAMUSCULAR | Status: AC
  Administered 2019-06-13: 25 mg via INTRAVENOUS
  Filled 2019-06-13: qty 1

## 2019-06-13 MED ORDER — AMLODIPINE BESYLATE 5 MG PO TABS
10.0000 mg | ORAL_TABLET | Freq: Once | ORAL | Status: AC
  Administered 2019-06-13: 10 mg via ORAL
  Filled 2019-06-13: qty 2

## 2019-06-13 MED ORDER — PROCHLORPERAZINE EDISYLATE 10 MG/2ML IJ SOLN
10.0000 mg | Freq: Once | INTRAMUSCULAR | Status: AC
  Administered 2019-06-13: 10 mg via INTRAVENOUS
  Filled 2019-06-13: qty 2

## 2019-06-13 MED ORDER — SODIUM CHLORIDE 0.9 % IV BOLUS
1000.0000 mL | Freq: Once | INTRAVENOUS | Status: AC
  Administered 2019-06-13: 1000 mL via INTRAVENOUS

## 2019-06-13 NOTE — ED Triage Notes (Signed)
Pt c/o increased BP x 3 days , seen Sunday for same.

## 2019-06-13 NOTE — ED Provider Notes (Signed)
[place] EMERGENCY DEPARTMENT Provider Note   CSN: [id number] Arrival date & time: 06/13/19  1753     History Chief Complaint  Patient presents with  . Hypertension    Shelley Ryan is a 44 y.o. female w/ hx of CVA, PCOS, and dural sinus thrombosis, presenting to the ED with persistent headache and high blood pressure, as well as left sided weakness, for 3 days.  The patient was seen in our ED on 06/11/2019 after onset of her headache and left sided weakness.  She describes having a visual aura preceeding her headache, with "wavy lines in my vision," which was followed by this significant throbbing headache and weakness in her left arm and her left leg.  She underwent a robust stroke evaluation on 06/11/2019 at [facility] ED, including CTH, MR brain, MR angio head and neck, and MRV head, along with neuro consultation.  The imaging revealed no acute findings to explain her neurological symptoms.  Specifically, no venous thrombosis, evidence of SAH, or evidence of PRES.  The neurologist consultation by Dr Leonel Ramsay felt this was consistent with a complicated migraine, and had no further recommendations.  She returns today complaining of persistent headache, persistently elevated blood pressure, and continued weakness in her left side (it had improved after her discharge, but now appears to have worsened). She is ambulatory.  No fevers or chills.  She had the COVID vaccine.  HPI     Past Medical History:  Diagnosis Date  . BRCA1 negative 10/08/2013  . CVA (cerebral infarction) 2002   Most likely from Hormonal Tx  . Dural sinus thrombosis 2002  . PCOS (polycystic ovarian syndrome)    stroke on OCP hormonal therapy    Patient Active Problem List   Diagnosis Date Noted  . Chronic cholecystitis with calculus 10/30/2013  . Acute cholecystitis 10/10/2013  . Status post laparoscopic cholecystectomy June 2015 10/09/2013  . Obesity (BMI 30-39.9) 10/08/2013  . PCOS (polycystic  ovarian syndrome)     Past Surgical History:  Procedure Laterality Date  . APPENDECTOMY    . CHOLECYSTECTOMY N/A 10/09/2013   Procedure: LAPAROSCOPIC CHOLECYSTECTOMY WITH INTRAOPERATIVE CHOLANGIOGRAM;  Surgeon: Pedro Earls, MD;  Location: [facility];  Service: General;  Laterality: N/A;  . KNEE ARTHROSCOPY Bilateral   . TYMPANOSTOMY TUBE PLACEMENT     x 2  . WISDOM TOOTH EXTRACTION       OB History   No obstetric history on file.     Family History  Problem Relation Age of Onset  . Breast cancer Other        Great Grandmother   . Breast cancer Other     Social History   Tobacco Use  . Smoking status: Never Smoker  . Smokeless tobacco: Never Used  Substance Use Topics  . Alcohol use: No  . Drug use: No    Home Medications Prior to Admission medications   Medication Sig Start Date End Date Taking? Authorizing Provider  acetaminophen (TYLENOL) 325 MG tablet Take 650 mg by mouth every 6 (six) hours as needed for mild pain.     [provider]  amitriptyline (ELAVIL) 25 MG tablet Take 25 mg by mouth at bedtime. 08/09/18   [provider]  amLODipine (NORVASC) 5 MG tablet Take 1 tablet (5 mg total) by mouth daily. 06/13/19   Wyvonnia Dusky, MD  Cholecalciferol (VITAMIN D-1000 MAX ST) 25 MCG (1000 UT) tablet Take 1,000 Units by mouth daily.    [provider]  cimetidine (TAGAMET) 400 MG tablet Take 400 mg by mouth 2 (two) times a day. 08/28/18   [provider]  dicyclomine (BENTYL) 10 MG capsule Take 10 mg by mouth 3 (three) times daily as needed for spasms.  08/09/18   [provider]  Levonorgestrel 19.5 MG IUD by Intrauterine route.    [provider]  metFORMIN (GLUCOPHAGE) 500 MG tablet Take 500 mg by mouth 2 (two) times a day. 07/10/18   [provider]  Multiple Vitamins-Minerals (MULTIVITAMIN WITH MINERALS) tablet Take 1 tablet by mouth daily.    [provider]  prochlorperazine (COMPAZINE) 10 MG  tablet Take 10 mg by mouth every 6 (six) hours as needed for nausea or vomiting.  12/22/15   [provider]  promethazine (PHENERGAN) 25 MG suppository Place 25 mg rectally every 6 (six) hours as needed for nausea or vomiting.     [provider]    Allergies    Biaxin [clarithromycin], Cisapride, Estrogens, Metronidazole, Minocycline hcl, Cholestyramine, Keflex [cephalexin], Reglan [metoclopramide], Tetracyclines & related, Unasyn [ampicillin-sulbactam sodium], Ciprofloxacin, Codeine, and Tape  Review of Systems   Review of Systems  Constitutional: Negative for chills and fever.  Eyes: Positive for photophobia and visual disturbance.  Respiratory: Negative for cough and shortness of breath.   Cardiovascular: Negative for chest pain and palpitations.  Neurological: Positive for weakness, light-headedness, numbness and headaches.  All other systems reviewed and are negative.   Physical Exam Updated Vital Signs BP (!) 160/107 (BP Location: Right Arm)   Pulse 88   Temp 98 F (36.7 C) (Oral)   Resp 18   Ht '5\' 7"'  (1.702 m)   Wt 111 kg   SpO2 99%   BMI 38.33 kg/m   Physical Exam Vitals and nursing note reviewed.  Constitutional:      General: She is not in acute distress.    Appearance: She is well-developed.  HENT:     Head: Normocephalic and atraumatic.  Eyes:     Extraocular Movements: Extraocular movements intact.     Conjunctiva/sclera: Conjunctivae normal.     Pupils: Pupils are equal, round, and reactive to light.  Cardiovascular:     Rate and Rhythm: Normal rate and regular rhythm.     Pulses: Normal pulses.  Pulmonary:     Effort: Pulmonary effort is normal. No respiratory distress.  Musculoskeletal:     Cervical back: Neck supple.  Skin:    General: Skin is warm and dry.  Neurological:     Mental Status: She is alert.     GCS: GCS eye subscore is 4. GCS verbal subscore is 5. GCS motor subscore is 6.     Cranial Nerves: Cranial nerves are  intact. No cranial nerve deficit, dysarthria or facial asymmetry.     Coordination: Coordination is intact.     Comments: Reports paresthesia in left hand and left fingers Noted giveway weakness of left arm and left leg in grip strength, arm abduction, hip flexion Ambulatory      ED Results / Procedures / Treatments   Labs (all labs ordered are listed, but only abnormal results are displayed) Labs Reviewed  RESPIRATORY PANEL BY RT PCR (FLU A&B, COVID)    EKG None  Radiology No results found.  Procedures Procedures (including critical care time)  Medications Ordered in ED Medications  amLODipine (NORVASC) tablet 10 mg (10 mg Oral Given 06/13/19 1942)  sodium chloride 0.9 % bolus 1,000 mL (0 mLs Intravenous Stopped 06/13/19 2041)  ketorolac (TORADOL)  15 MG/ML injection 15 mg (15 mg Intravenous Given 06/13/19 1941)  prochlorperazine (COMPAZINE) injection 10 mg (10 mg Intravenous Given 06/13/19 1941)  diphenhydrAMINE (BENADRYL) injection 25 mg (25 mg Intravenous Given 06/13/19 1941)    ED Course  I have reviewed the triage vital signs and the nursing notes.  Pertinent labs & imaging results that were available during my care of the patient were reviewed by me and considered in my medical decision making (see chart for details).  44 yo pleasant female presenting to ED with continued headache and left-sided weakness, and HTN, for 3 days.  Symptoms are nearly identical to her initial presentation to our ED three days ago, at which time she underwent extensive neuroimaging and evaluation, without evidence of acute stroke. She was felt to have a complex migraine, which is likely the basis of what I'm seeing today.  Specifically, her reported aura of "waves" in her vision preceding her headache suggests the onset of a migraine.  I have a lower suspicion for PRES as she does not have encephalopathy (in fact, is very sharp, calm, and conversational on exam), and a hypertensive 'emergency'  would very unlikely cause this focal left sided weakness in the absence of a stroke or hemorrhage, which was not found on prior neuroimaging.  In the same vein, I have a low suspicion for aortic dissection given her benign cardiovascular exam.  The presence of persistent symptoms for 3 days with hemodynamic stability, along with no chest pain or discomfort, makes a dissection quite unlikely.  Her elevated BP may be related to her migraine pain, we'll see if it improves with treatment of her headache.  I do not feel we need to repeat her labwork which was so recent from 2/15.  Her pregnancy was negative at the time.     Finally, I discussed adding a second HTN agent, norvasc to her recently started BP regimen, and to keep a BP diary.  Also advised f/u with neurology, and I'll place a referral.  She was in agreement with this plan.   Clinical Course as of Jun 13 1312  Wed Jun 13, 2019  2130 Feeling much better headache now 2/10 and has improvement of her symptoms including arm weakness.  I suspect again this is likely a complicated migraine, particularly given her thorough neurological workup 2 days ago including MR imaging and MRV, negative at that time.  I explained this to her, advised that she f/u with neurology, and keep a BP diary at home.  She verbalized understanding.   [MT]    Clinical Course User Index [MT] Kayah Hecker, Carola Rhine, MD    Final Clinical Impression(s) / ED Diagnoses Final diagnoses:  Hypertension, unspecified type  Migraine syndrome    Rx / DC Orders ED Discharge Orders         Ordered    Ambulatory referral to Neurology    Comments: An appointment is requested in approximately: 1 week Patient seen twice in Ed for suspected complicated migraines, neuro deficits, evaluated by Dr Leonel Ramsay.  Recommending expedited office follow up in next week if possible.   06/13/19 2134    amLODipine (NORVASC) 5 MG tablet  Daily     06/13/19 2135           Wyvonnia Dusky,  MD 06/14/19 1315

## 2019-06-13 NOTE — Discharge Instructions (Signed)
Please keep a daily log of your blood pressure at home.  I added second medication called amlodipine, please take this 5 mg once daily.  This is in addition to your typical blood pressure medicine.  If your systolic blood pressure (top number) drops below 110 mmhg, stop taking the amlodipine.  I placed a referral to our neurology office to help speed up your follow up.  It is possible you are suffering from complex migraines causing these symptoms.  Your covid test today was negative.

## 2019-06-13 NOTE — ED Notes (Signed)
Pt states this past Monday was evaluated at [facility] ED with Stroke like symptoms, was cleared, but still has a HA since discharge. Having dizziness, left arm is now weak and having nausea

## 2019-06-15 ENCOUNTER — Encounter: Payer: Self-pay | Admitting: Neurology

## 2019-06-19 ENCOUNTER — Ambulatory Visit: Payer: BC Managed Care – PPO | Admitting: Neurology

## 2019-06-19 ENCOUNTER — Encounter: Payer: Self-pay | Admitting: Neurology

## 2019-06-19 ENCOUNTER — Other Ambulatory Visit: Payer: Self-pay

## 2019-06-19 VITALS — BP 139/86 | HR 122 | Temp 97.2°F | Ht 67.0 in | Wt 244.0 lb

## 2019-06-19 DIAGNOSIS — G932 Benign intracranial hypertension: Secondary | ICD-10-CM | POA: Diagnosis not present

## 2019-06-19 DIAGNOSIS — R519 Headache, unspecified: Secondary | ICD-10-CM

## 2019-06-19 DIAGNOSIS — M7918 Myalgia, other site: Secondary | ICD-10-CM

## 2019-06-19 NOTE — Progress Notes (Addendum)
[facility]    Provider:  Dr Jaynee Eagles Requesting Provider: [name], MD Primary Care Provider:  [name], MD  CC:  headache  HPI:  Shelley Ryan is a 44 y.o. female here as requested by [name], MD for headaches.PMHx PCOS, dural sinus thrombosis(2002), She does not have a history of headache per patient. No history of migraines, no history of migraines in the family. She has sleep apnea and is very compliant with cpap and she follows closely with a sleep doctor. This is day 8 of her headaches, mostly diffuse and like a poker on the left side of the head, base of the skull, nausea and vertigo, tinniitus and a whooshing in her ears.  Headache is worse laying down when flat, worse in the morning and better throughout the day. She has gained 40 pounds in the span of 2-3 months, she has had hormonal testing and has seen her primary care for this, she has an IUD, placed quite some time ago. Also vision changes, she has an ophthalmologist she just went and no papilledema. She has a lot of tightness in the cervical muscles as well that pre-dates the new symptoms will send for PT. She has left sided weakness and hemisensory loss from prior dural sinus thrombosis, worsening, may be recrudescence of symptoms as workup revealed no new issues. No other focal neurologic deficits, associated symptoms, inciting events or modifiable factors.  Reviewed notes, labs and imaging from outside physicians, which showed:  I reviewed MRI images of the brain February 2021 which were significant for partially empty sella which we can sometimes see in idiopathic intracranial hypertension.  I reviewed the reports of the MR angio of the head and neck which did not show any significant large or medium artery occlusions or stenosis, and MRV was normal.  I also reviewed labs.  Review of Systems: Patient complains of symptoms per HPI as well as the following symptoms: headache, hearing changes.  Pertinent negatives and positives per HPI. All others negative.   Social History   Socioeconomic History  . Marital status: Married    Spouse name: Not on file  . Number of children: Not on file  . Years of education: Not on file  . Highest education level: Not on file  Occupational History  . Not on file  Tobacco Use  . Smoking status: Never Smoker  . Smokeless tobacco: Never Used  Substance and Sexual Activity  . Alcohol use: No  . Drug use: No  . Sexual activity: Never    Birth control/protection: None  Other Topics Concern  . Not on file  Social History Narrative   Lives with husband    Right handed   Caffeine: 1 cup once a week   Social Determinants of Health   Financial Resource Strain:   . Difficulty of Paying Living Expenses: Not on file  Food Insecurity:   . Worried About Charity fundraiser in the Last Year: Not on file  . Ran Out of Food in the Last Year: Not on file  Transportation Needs:   . Lack of Transportation (Medical): Not on file  . Lack of Transportation (Non-Medical): Not on file  Physical Activity:   . Days of Exercise per Week: Not on file  . Minutes of Exercise per Session: Not on file  Stress:   . Feeling of Stress : Not on file  Social Connections:   . Frequency of Communication with Friends and Family: Not on file  .  Frequency of Social Gatherings with Friends and Family: Not on file  . Attends Religious Services: Not on file  . Active Member of Clubs or Organizations: Not on file  . Attends Archivist Meetings: Not on file  . Marital Status: Not on file  Intimate Partner Violence:   . Fear of Current or Ex-Partner: Not on file  . Emotionally Abused: Not on file  . Physically Abused: Not on file  . Sexually Abused: Not on file    Family History  Problem Relation Age of Onset  . Breast cancer Other        Great Grandmother   . Breast cancer Other   . Headache Neg Hx   . Migraines Neg Hx     Past Medical History:    Diagnosis Date  . BRCA1 negative 10/08/2013  . CVA (cerebral infarction) 2002   Most likely from Hormonal Tx  . 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 Gastric outlet obstruction   . Gastroparesis   . Hiatal hernia   . Low vitamin D level    2013  17  . PCOS (polycystic ovarian syndrome)    stroke on OCP hormonal therapy  . Pylorospasm     Patient Active Problem List   Diagnosis Date Noted  . Esophageal hiatal hernia 06/20/2019  . Stroke ([place]) 06/20/2019  . IIH (idiopathic intracranial hypertension) 06/20/2019  . 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 Myopia with astigmatism, bilateral 01/05/2019  . Posterior vitreous detachment of both eyes 01/05/2019  . Blepharitis of both upper and lower eyelid 01/05/2019  . Family history of malignant hyperthermia 05/17/2016  . GERD (gastroesophageal reflux disease) 02/25/2016  . Chronic cholecystitis with calculus 10/30/2013  . Acute cholecystitis 10/10/2013  . Status post laparoscopic cholecystectomy June 2015 10/09/2013  . Obesity (BMI 30-39.9) 10/08/2013  . PCOS (polycystic ovarian syndrome)     Past Surgical History:  Procedure Laterality Date  . APPENDECTOMY    . CHOLECYSTECTOMY N/A 10/09/2013   Procedure: LAPAROSCOPIC CHOLECYSTECTOMY WITH INTRAOPERATIVE CHOLANGIOGRAM;  Surgeon: Pedro Earls, MD;  Location: [facility];  Service: General;  Laterality: N/A;  . COLON SURGERY    . KNEE ARTHROSCOPY Bilateral   . PYLOROPLASTY    . RIGHT OOPHORECTOMY    . SALPINGECTOMY Bilateral   . TYMPANOSTOMY TUBE PLACEMENT     x 2  . WISDOM TOOTH EXTRACTION      Current Outpatient Medications  Medication Sig Dispense Refill  . acetaminophen (TYLENOL) 325 MG tablet Take 650 mg by mouth every 6 (six) hours as needed for mild pain.     Marland Kitchen amitriptyline (ELAVIL) 25 MG tablet Take 25 mg by mouth at bedtime.    . Cholecalciferol (VITAMIN  D-1000 MAX ST) 25 MCG (1000 UT) tablet Take 1,000 Units by mouth daily.    . cimetidine (TAGAMET) 400 MG tablet Take 400 mg by mouth 2 (two) times a day.    . dicyclomine (BENTYL) 10 MG capsule Take 10 mg by mouth 3 (three) times daily as needed for spasms.     . Levonorgestrel 19.5 MG IUD by Intrauterine route.    Marland Kitchen losartan (COZAAR) 50 MG tablet Take 50 mg by mouth daily.    . metFORMIN (GLUCOPHAGE) 500 MG tablet Take 500 mg by mouth 2 (two) times a day.    . Multiple Vitamins-Minerals (MULTIVITAMIN WITH MINERALS) tablet Take  1 tablet by mouth daily.    . ondansetron (ZOFRAN-ODT) 4 MG disintegrating tablet Take 4 mg by mouth every 4 (four) hours as needed for nausea or vomiting.    . prochlorperazine (COMPAZINE) 10 MG tablet Take 10 mg by mouth every 6 (six) hours as needed for nausea or vomiting.     Marland Kitchen acetaZOLAMIDE (DIAMOX) 250 MG tablet Take 1 tablet (250 mg total) by mouth 2 (two) times daily. 60 tablet 6   No current facility-administered medications for this visit.    Allergies as of 06/19/2019 - Review Complete 06/19/2019  Allergen Reaction Noted  . Biaxin [clarithromycin] Nausea And Vomiting 10/08/2013  . Cisapride Anaphylaxis and Other (See Comments) 10/20/2017  . Estrogens Other (See Comments) 01/27/2016  . Metronidazole Diarrhea 01/28/2016  . Minocycline hcl Nausea And Vomiting and Other (See Comments) 01/28/2016  . Ampicillin  06/19/2019  . Cholestyramine Nausea And Vomiting 01/28/2016  . Keflex [cephalexin] Nausea And Vomiting 10/08/2013  . Other  06/19/2019  . Reglan [metoclopramide] Other (See Comments) 10/08/2013  . Tetracyclines & related Nausea And Vomiting and Other (See Comments) 10/08/2013  . Unasyn [ampicillin-sulbactam sodium] Nausea And Vomiting 10/08/2013  . Ciprofloxacin Hives and Rash 10/08/2013  . Codeine Itching and Rash 10/08/2013  . Tape Rash 12/10/2015    Vitals: BP 139/86 (BP Location: Right Arm, Patient Position: Sitting, Cuff Size: Large)    Pulse (!) 122   Temp (!) 97.2 F (36.2 C) Comment: taken at front  Ht _0  (1.702 m)   Wt 244 lb (110.7 kg)   BMI 38.22 kg/m  Last Weight:  Wt Readings from Last 1 Encounters:  06/19/19 244 lb (110.7 kg)   Last Height:   Ht Readings from Last 1 Encounters:  06/19/19 _1  (1.702 m)     Physical exam: Exam: Gen: NAD, conversant, well nourised, obese, well groomed                     CV: RRR, no MRG. No Carotid Bruits. No peripheral edema, warm, nontender Eyes: Conjunctivae clear without exudates or hemorrhage  Neuro: Detailed Neurologic Exam  Speech:    Speech is normal; fluent and spontaneous with normal comprehension.  Cognition:    The patient is oriented to person, place, and time;     recent and remote memory intact;     language fluent;     normal attention, concentration,     fund of knowledge Cranial Nerves:    The pupils are equal, round, and reactive to light. The fundi are flat. Visual fields are full to finger confrontation. Extraocular movements are intact. Trigeminal sensation is intact and the muscles of mastication are normal. The face is symmetric. The palate elevates in the midline. Hearing intact. Voice is normal. Shoulder shrug is normal. The tongue has normal motion without fasciculations.   Coordination:    No dysmetria  Gait:    Normal native gait  Motor Observation:    No asymmetry, no atrophy, and no involuntary movements noted. Tone:    Normal muscle tone.    Posture:    Posture is normal. normal erect    Strength: left arm 4/5 throughout otherwise strength is V/V in the upper and lower limbs.      Sensation: diminished sensation left side face, arm and leg     Reflex Exam:  DTR's:   Absent AJs otherwise deep tendon reflexes in the upper and lower extremities are normal bilaterally.   Toes:    The  toes are downgoing bilaterally.   Clonus:    Clonus is absent.    Assessment/Plan:  44 year old with headaches, no history of  migraines. She has multiple risk actors for IDIOPATHIC INTRACRANIAL HYPERTENSION including dural venous sinus thrombosis, PCOS, IUD, recent weight gain, tetracycline in the past for 4 months. Needs LP.  She has left sided weakness and hemisensory loss from prior dural sinus thrombosis, worsening, may be recrudescence of symptoms as workup revealed no new issues. MRI showed partially empty sella.  - Referral to the [facility] wellness center for obesity - I called [facility] and we expedited Lumbar Puncture, scheduled in the morning - [facility] in [place] for PT and dry needling for cervical myofascial pain syndrome  Addendum: Opening pressure not significantly elevated at 22 and this does not meet clinical criteria for IDIOPATHIC INTRACRANIAL HYPERTENSION. We can try Diamox and see if this helps given her symptoms and MRI with partially empty sella.  CSF labs were normal.  Starting Diamox.   Orders Placed This Encounter  Procedures  . DG FLUORO GUIDED LOC OF NEEDLE/CATH TIP FOR SPINAL INJECT LT  . Comprehensive metabolic panel  . CBC  . Hemoglobin A1c  . Vitamin D, 25-hydroxy  . Ambulatory referral to Physical Therapy     Cc: [name], MD,    Sarina Ill, MD  [facility] [address] [place], [place] [address]  Phone [phone] Fax [phone]

## 2019-06-19 NOTE — Patient Instructions (Addendum)
Lumbar Puncture Blood work Likely start Diamox [facility]   Idiopathic Intracranial Hypertension  Idiopathic intracranial hypertension (IIH) is a condition that increases pressure around the brain. The fluid that surrounds the brain and spinal cord (cerebrospinal fluid, CSF) increases and causes the pressure. Idiopathic means that the cause of this condition is not known. IIH affects the brain and spinal cord (is a neurological disorder). If this condition is not treated, it can cause vision loss or blindness. What increases the risk? You are more likely to develop this condition if:  You are severely overweight (obese).  You are a woman who has not gone through menopause.  You take certain medicines, such as birth control or steroids. What are the signs or symptoms? Symptoms of IIH include:  Headaches. This is the most common symptom.  Pain in the shoulders or neck.  Nausea and vomiting.  A "rushing water" or pulsing sound within the ears (pulsatile tinnitus).  Double vision.  Blurred vision.  Brief episodes of complete vision loss. How is this diagnosed? This condition may be diagnosed based on:  Your symptoms.  Your medical history.  CT scan of the brain.  MRI of the brain.  Magnetic resonance venogram (MRV) to check veins in the brain.  Diagnostic lumbar puncture. This is a procedure to remove and examine a sample of cerebrospinal fluid. This procedure can determine whether too much fluid may be causing IIH.  A thorough eye exam to check for swelling or nerve damage in the eyes. How is this treated? Treatment for this condition depends on your symptoms. The goal of treatment is to decrease the pressure around your brain. Common treatments include:  Medicines to decrease the production of spinal fluid and lower the pressure within your skull.  Medicines to prevent or treat headaches.  Surgery to place drains (shunts) in your brain to  remove excess fluid.  Lumbar puncture to remove excess cerebrospinal fluid. Follow these instructions at home:  If you are overweight or obese, work with your health care provider to lose weight.  Take over-the-counter and prescription medicines only as told by your health care provider.  Do not drive or use heavy machinery while taking medicines that can make you sleepy.  Keep all follow-up visits as told by your health care provider. This is important. Contact a health care provider if:  You have changes in your vision, such as: ? Double vision. ? Not being able to see colors (color vision). Get help right away if:  You have any of the following symptoms and they get worse or do not get better. ? Headaches. ? Nausea. ? Vomiting. ? Vision changes or difficulty seeing. Summary  Idiopathic intracranial hypertension (IIH) is a condition that increases pressure around the brain. The cause is not known (is idiopathic).  The most common symptom of IIH is headaches.  Treatment may include medicines or surgery to relieve the pressure on your brain. This information is not intended to replace advice given to you by your health care provider. Make sure you discuss any questions you have with your health care provider. Document Revised: 03/25/2017 Document Reviewed: 03/03/2016 Elsevier Patient Education  2020 Elsevier Inc.  Acetazolamide Oral Tablets What is this medicine? ACETAZOLAMIDE (a set a ZOLE a mide) is a diuretic. It helps you make more urine and to lose salt and excess water from your body. It treats swelling from heart disease. It helps treat some seizures and some kinds of glaucoma. It also  treats and prevents symptoms of altitude sickness (acute mountain sickness). This medicine may be used for other purposes; ask your health care provider or pharmacist if you have questions. COMMON BRAND NAME(S): Diamox What should I tell my health care provider before I take this  medicine? They need to know if you have any of these conditions:  glaucoma  kidney disease  liver disease  low adrenal gland function  lung or breathing disease (COPD, chronic bronchitis, emphysema)  an unusual or allergic reaction to acetazolamide, sulfa drugs, other drugs, foods, dyes or preservatives  pregnant or trying to get pregnant  breast-feeding How should I use this medicine? Take this medicine by mouth with a glass of water. Follow the directions on the prescription label. Take this medicine with food if it upsets your stomach. Take your doses at regular intervals. Do not take your medicine more often than directed. Do not stop taking except on your doctor's advice. Talk to your pediatrician regarding the use of this medicine in children. Special care may be needed. Patients over 72 years old may have a stronger reaction and need a smaller dose. Overdosage: If you think you have taken too much of this medicine contact a poison control center or emergency room at once. NOTE: This medicine is only for you. Do not share this medicine with others. What if I miss a dose? If you miss a dose, take it as soon as you can. If it is almost time for your next dose, take only that dose. Do not take double or extra doses. What may interact with this medicine? Do not take this medicine with any of the following medications:  methazolamide This medicine may also interact with the following medications:  aspirin and aspirin-like medicines  cyclosporine  lithium  medicine for diabetes  methenamine  other diuretics  phenytoin  primidone  quinidine  sodium bicarbonate  stimulant medicines like dextroamphetamine This list may not describe all possible interactions. Give your health care provider a list of all the medicines, herbs, non-prescription drugs, or dietary supplements you use. Also tell them if you smoke, drink alcohol, or use illegal drugs. Some items may interact  with your medicine. What should I watch for while using this medicine? Visit your doctor or health care professional for regular checks on your progress. You will need blood work done regularly. If you are diabetic, check your blood sugar as directed. You may need to be on a special diet while taking this medicine. Ask your doctor. Also, ask how many glasses of fluid you need to drink a day. You must not get dehydrated. You may get drowsy or dizzy. Do not drive, use machinery, or do anything that needs mental alertness until you know how this medicine affects you. Do not stand or sit up quickly, especially if you are an older patient. This reduces the risk of dizzy or fainting spells. This medicine can make you more sensitive to the sun. Keep out of the sun. If you cannot avoid being in the sun, wear protective clothing and use sunscreen. Do not use sun lamps or tanning beds/booths. What side effects may I notice from receiving this medicine? Side effects that you should report to your doctor or health care professional as soon as possible:  allergic reactions like skin rash, itching or hives, swelling of the face, lips, or tongue  breathing problems  confusion, depression  dark urine  fever  numbness, tingling in hands or feet  redness, blistering, peeling or  loosening of the skin, including inside the mouth  ringing in the ears  seizures  unusually weak or tired  yellowing of the eyes or skin Side effects that usually do not require medical attention (report to your doctor or health care professional if they continue or are bothersome):  change in taste  diarrhea  headache  loss of appetite  nausea, vomiting  passing urine more often This list may not describe all possible side effects. Call your doctor for medical advice about side effects. You may report side effects to FDA at 1-800-FDA-1088. Where should I keep my medicine? Keep out of the reach of children. Store  at room temperature between 20 and 25 degrees C (68 and 77 degrees F). Throw away any unused medicine after the expiration date. NOTE: This sheet is a summary. It may not cover all possible information. If you have questions about this medicine, talk to your doctor, pharmacist, or health care provider.  2020 Elsevier/Gold Standard (2019-02-06 12:32:38)

## 2019-06-20 ENCOUNTER — Other Ambulatory Visit: Payer: Self-pay | Admitting: Neurology

## 2019-06-20 ENCOUNTER — Ambulatory Visit
Admission: RE | Admit: 2019-06-20 | Discharge: 2019-06-20 | Disposition: A | Discharge status: Home / Self Care | Payer: BC Managed Care – PPO | Source: Ambulatory Visit | Attending: Neurology | Admitting: Neurology

## 2019-06-20 ENCOUNTER — Other Ambulatory Visit: Payer: Self-pay

## 2019-06-20 VITALS — BP 121/69 | HR 101

## 2019-06-20 DIAGNOSIS — I639 Cerebral infarction, unspecified: Secondary | ICD-10-CM | POA: Insufficient documentation

## 2019-06-20 DIAGNOSIS — G932 Benign intracranial hypertension: Secondary | ICD-10-CM | POA: Insufficient documentation

## 2019-06-20 DIAGNOSIS — K449 Diaphragmatic hernia without obstruction or gangrene: Secondary | ICD-10-CM | POA: Insufficient documentation

## 2019-06-20 DIAGNOSIS — E669 Obesity, unspecified: Secondary | ICD-10-CM

## 2019-06-20 LAB — COMPREHENSIVE METABOLIC PANEL
ALT: 35 IU/L — ABNORMAL HIGH (ref 0–32)
AST: 32 IU/L (ref 0–40)
Albumin/Globulin Ratio: 1.6 (ref 1.2–2.2)
Albumin: 4.7 g/dL (ref 3.8–4.8)
Alkaline Phosphatase: 93 IU/L (ref 39–117)
BUN/Creatinine Ratio: 11 (ref 9–23)
BUN: 12 mg/dL (ref 6–24)
Bilirubin Total: 0.4 mg/dL (ref 0.0–1.2)
CO2: 17 mmol/L — ABNORMAL LOW (ref 20–29)
Calcium: 9.8 mg/dL (ref 8.7–10.2)
Chloride: 103 mmol/L (ref 96–106)
Creatinine, Ser: 1.12 mg/dL — ABNORMAL HIGH (ref 0.57–1.00)
GFR calc Af Amer: 69 mL/min/{1.73_m2} (ref >59)
GFR calc non Af Amer: 59 mL/min/{1.73_m2} — ABNORMAL LOW (ref >59)
Globulin, Total: 2.9 g/dL (ref 1.5–4.5)
Glucose: 143 mg/dL — ABNORMAL HIGH (ref 65–99)
Potassium: 4.3 mmol/L (ref 3.5–5.2)
Sodium: 139 mmol/L (ref 134–144)
Total Protein: 7.6 g/dL (ref 6.0–8.5)

## 2019-06-20 LAB — CBC
Hematocrit: 44 % (ref 34.0–46.6)
Hemoglobin: 15.3 g/dL (ref 11.1–15.9)
MCH: 30.2 pg (ref 26.6–33.0)
MCHC: 34.8 g/dL (ref 31.5–35.7)
MCV: 87 fL (ref 79–97)
Platelets: 271 10*3/uL (ref 150–450)
RBC: 5.07 x10E6/uL (ref 3.77–5.28)
RDW: 13 % (ref 11.7–15.4)
WBC: 10.2 10*3/uL (ref 3.4–10.8)

## 2019-06-20 LAB — CSF CELL COUNT WITH DIFFERENTIAL
RBC Count, CSF: 0 cells/uL
WBC, CSF: 1 cells/uL (ref 0–5)

## 2019-06-20 LAB — GLUCOSE, CSF: Glucose, CSF: 61 mg/dL (ref 40–80)

## 2019-06-20 LAB — VITAMIN D 25 HYDROXY (VIT D DEFICIENCY, FRACTURES): Vit D, 25-Hydroxy: 22 ng/mL — ABNORMAL LOW (ref 30.0–100.0)

## 2019-06-20 LAB — HEMOGLOBIN A1C
Est. average glucose Bld gHb Est-mCnc: 128 mg/dL
Hgb A1c MFr Bld: 6.1 % — ABNORMAL HIGH (ref 4.8–5.6)

## 2019-06-20 LAB — PROTEIN, CSF: Total Protein, CSF: 43 mg/dL (ref 15–45)

## 2019-06-20 MED ORDER — ACETAZOLAMIDE 250 MG PO TABS: 250.0000 mg | ORAL_TABLET | Freq: Two times a day (BID) | ORAL | 6 refills | Status: DC

## 2019-06-20 NOTE — Discharge Instructions (Signed)

## 2019-06-25 ENCOUNTER — Other Ambulatory Visit: Payer: Self-pay | Admitting: Family Medicine

## 2019-06-25 DIAGNOSIS — Z1231 Encounter for screening mammogram for malignant neoplasm of breast: Secondary | ICD-10-CM

## 2019-07-02 ENCOUNTER — Ambulatory Visit (INDEPENDENT_AMBULATORY_CARE_PROVIDER_SITE_OTHER): Payer: BC Managed Care – PPO | Admitting: Family Medicine

## 2019-07-02 ENCOUNTER — Encounter (INDEPENDENT_AMBULATORY_CARE_PROVIDER_SITE_OTHER): Payer: Self-pay | Admitting: Family Medicine

## 2019-07-02 ENCOUNTER — Other Ambulatory Visit: Payer: Self-pay

## 2019-07-02 VITALS — BP 139/81 | HR 87 | Temp 98.3°F | Ht 66.0 in | Wt 242.0 lb

## 2019-07-02 DIAGNOSIS — R0602 Shortness of breath: Secondary | ICD-10-CM

## 2019-07-02 DIAGNOSIS — E559 Vitamin D deficiency, unspecified: Secondary | ICD-10-CM

## 2019-07-02 DIAGNOSIS — R5383 Other fatigue: Secondary | ICD-10-CM

## 2019-07-02 DIAGNOSIS — Z9189 Other specified personal risk factors, not elsewhere classified: Secondary | ICD-10-CM

## 2019-07-02 DIAGNOSIS — I10 Essential (primary) hypertension: Secondary | ICD-10-CM

## 2019-07-02 DIAGNOSIS — Z1331 Encounter for screening for depression: Secondary | ICD-10-CM | POA: Diagnosis not present

## 2019-07-02 DIAGNOSIS — Z6839 Body mass index (BMI) 39.0-39.9, adult: Secondary | ICD-10-CM

## 2019-07-02 DIAGNOSIS — G4733 Obstructive sleep apnea (adult) (pediatric): Secondary | ICD-10-CM

## 2019-07-02 DIAGNOSIS — R7303 Prediabetes: Secondary | ICD-10-CM

## 2019-07-02 DIAGNOSIS — Z0289 Encounter for other administrative examinations: Secondary | ICD-10-CM

## 2019-07-02 NOTE — Progress Notes (Signed)
Dear Lucia Gaskins,   Thank you for referring Sharlisa Hollifield to our clinic. The following note includes my evaluation and treatment recommendations.  Chief Complaint:   OBESITY Shelley Ryan (MR# [id number]) is a 44 y.o. female who presents for evaluation and treatment of obesity and related comorbidities. Anitria was referred to our clinic by Naomie Dean, MD. Current BMI is Body mass index is 39.06 kg/m.Marland Kitchen Shalaine has been struggling with her weight for many years and has been unsuccessful in either losing weight, maintaining weight loss, or reaching her healthy weight goal.  Eutha is currently in the action stage of change and ready to dedicate time achieving and maintaining a healthier weight. Meliah is interested in becoming our patient and working on intensive lifestyle modifications including (but not limited to) diet and exercise for weight loss.  Jourdan's habits were reviewed today and are as follows: Her family eats meals together, she thinks her family will eat healthier with her, her desired weight loss is 72 pounds, she has been heavy most of her life, she started gaining weight after college, her heaviest weight ever was 158 pounds, she has significant food cravings issues, she snacks frequently in the evenings, she skips meals frequently, she sometimes makes poor food choices, she has binge eating behaviors and she struggles with emotional eating.  Aiyonna has significant gastroparesis. Breakfast consists of greek yogurt and fruit (feels full). Lunch consists of a sandwich with [place] (2) and cheese (1 slice), teaspoon mayonnaise and fruit (feels full). Dinner consists of shrimp (4 pieces), potatoes (1.5 cups), and corn (1/2 ear), (feels full).  Depression Screen Lakasha's Food and Mood (modified PHQ-9) score was mildly positive. PHQ Score is 9.  Depression screen PHQ 2/9 07/02/2019  Decreased Interest 1  Down, Depressed, Hopeless 1  PHQ - 2 Score 2  Altered sleeping 1  Tired,  decreased energy 2  Change in appetite 1  Feeling bad or failure about yourself  2  Trouble concentrating 1  Moving slowly or fidgety/restless 0  Suicidal thoughts 0  PHQ-9 Score 9  Difficult doing work/chores Not difficult at all   Subjective:   Other fatigue  Amazing admits to daytime somnolence and admits to waking up still tired. Patent has a history of symptoms of daytime fatigue, morning fatigue, morning headache and hypertension. Demesha generally gets 7 hours of sleep per night, and states that she has restless sleep. Snoring is present. Apneic episodes are not present. Epworth Sleepiness Score is 7. EKG from 06/11/2019 showed sinus tachycardia at 102 BPM.  Shortness of breath on exertion  Harriett Sine notes increasing shortness of breath with exercising and seems to be worsening over time with weight gain. She notes getting out of breath sooner with activity than she used to. This has not gotten worse recently. Keyanna denies shortness of breath at rest or orthopnea.  Vitamin D deficiency Kiasha's Vitamin D level was 22.0 on 06/19/19. She is on vit D 2,000 IU daily. She admits fatigue and denies nausea, vomiting or muscle weakness.  Prediabetes  Teighan has a diagnosis of prediabetes based on her elevated Hgb A1c and was informed this puts her at greater risk of developing diabetes. Her last A1c was 6.1 06/19/19. She was diagnosed approximately six months ago. Terrionna is on metformin two times per day. She is attempting to work on diet and exercise to decrease her risk of diabetes.  Lab Results  Component Value Date   HGBA1C 6.1 (H) 06/19/2019   No results found  for: INSULIN  Essential hypertension Mery is on Losartan 50 mg daily. This is a recent diagnosis.  BP Readings from Last 3 Encounters:  07/02/19 139/81  06/20/19 121/69  06/19/19 139/86   Lab Results  Component Value Date   CREATININE 1.12 (H) 06/19/2019   CREATININE 1.12 (H) 06/11/2019   CREATININE 0.72 10/10/2013   OSA  (obstructive sleep apnea) Elnita has a diagnosis of sleep apnea. She reports that she wears CPAP nightly. Bettie sees the sleep doctor at [facility].  At risk for diabetes mellitus Jetta is at higher than average risk for developing diabetes due to her obesity and prediabetes.   Assessment/Plan:   Other fatigue  Reganne does feel that her weight is causing her energy to be lower than it should be. Fatigue may be related to obesity, depression or many other causes. Labs will be ordered, and in the meanwhile, Jyla will focus on self care including making healthy food choices, increasing physical activity and focusing on stress reduction.  Shortness of breath on exertion  Latreshia does feel that she gets out of breath more easily that she used to when she exercises. Maelie's shortness of breath appears to be obesity related and exercise induced. She has agreed to work on weight loss and gradually increase exercise to treat her exercise induced shortness of breath. Labs and indirect calorimetry will be ordered. We will continue to monitor closely.  Vitamin D deficiency Low Vitamin D level contributes to fatigue and are associated with obesity, breast, and colon cancer. She will continue to take OTC Vitamin D @2 ,000 IU daily and she will follow-up for routine testing of Vitamin D, at least 2-3 times per year to avoid over-replacement. We will check vitamin D level in 3 months.  Prediabetes  Clydette will begin to work on weight loss, exercise, and decreasing simple carbohydrates to help decrease the risk of diabetes. We will check insulin level today.  Essential hypertension Halina is working on healthy weight loss and exercise to improve blood pressure control. We will check CMP today. We will watch for signs of hypotension as she continues her lifestyle modifications.  OSA (obstructive sleep apnea) Intensive lifestyle modifications are the first line treatment for this issue. We discussed several  lifestyle modifications today and she will continue to work on diet, exercise and weight loss efforts. Jasleen will follow up at [facility] for further management. We will continue to monitor. Orders and follow up as documented in patient record.   Depression screening Khamil had a positive depression screening. Depression is commonly associated with obesity and often results in emotional eating behaviors. We will monitor this closely and work on CBT to help improve the non-hunger eating patterns. Referral to Psychology may be required if no improvement is seen as she continues in our clinic.  At risk for diabetes mellitus Madisun was given approximately 15 minutes of diabetes education and counseling today. We discussed intensive lifestyle modifications today with an emphasis on weight loss as well as increasing exercise and decreasing simple carbohydrates in her diet. We also reviewed medication options with an emphasis on risk versus benefit of those discussed.   Repetitive spaced learning was employed today to elicit superior memory formation and behavioral change.  Class 2 severe obesity with serious comorbidity and body mass index (BMI) of 39.0 to 39.9 in adult, unspecified obesity type (HCC) Meelah is currently in the action stage of change and her goal is to continue with weight loss efforts. I recommend Maryjean begin the  structured treatment plan as follows:  She has agreed to the Category 2 Plan.  Exercise goals: No exercise has been prescribed at this time.   Behavioral modification strategies: increasing lean protein intake, increasing vegetables, meal planning and cooking strategies, keeping healthy foods in the home and planning for success.  She was informed of the importance of frequent follow-up visits to maximize her success with intensive lifestyle modifications for her multiple health conditions. She was informed we would discuss her lab results at her next visit unless there is a critical  issue that needs to be addressed sooner. Jaylani agreed to keep her next visit at the agreed upon time to discuss these results.  Objective:   Blood pressure 139/81, pulse 87, temperature 98.3 F (36.8 C), temperature source Oral, height 5\' 6"  (1.676 m), weight 242 lb (109.8 kg), SpO2 99 %. Body mass index is 39.06 kg/m.  Indirect Calorimeter completed today shows a VO2 of 208 and a REE of 1447.  Her calculated basal metabolic rate is 1700 thus her basal metabolic rate is worse than expected.  General: Cooperative, alert, well developed, in no acute distress. HEENT: Conjunctivae and lids unremarkable. Cardiovascular: Regular rhythm. Positive for Grade II/VI holosystolic murmur. Lungs: Normal work of breathing. Neurologic: No focal deficits.   Lab Results  Component Value Date   CREATININE 1.12 (H) 06/19/2019   BUN 12 06/19/2019   NA 139 06/19/2019   K 4.3 06/19/2019   CL 103 06/19/2019   CO2 17 (L) 06/19/2019   Lab Results  Component Value Date   ALT 35 (H) 06/19/2019   AST 32 06/19/2019   ALKPHOS 93 06/19/2019   BILITOT 0.4 06/19/2019   Lab Results  Component Value Date   HGBA1C 6.1 (H) 06/19/2019   No results found for: INSULIN No results found for: TSH No results found for: CHOL, HDL, LDLCALC, LDLDIRECT, TRIG, CHOLHDL Lab Results  Component Value Date   WBC 10.2 06/19/2019   HGB 15.3 06/19/2019   HCT 44.0 06/19/2019   MCV 87 06/19/2019   PLT 271 06/19/2019   No results found for: IRON, TIBC, FERRITIN   Ref. Range 06/19/2019 11:49  Vitamin D, 25-Hydroxy Latest Ref Range: 30.0 - 100.0 ng/mL 22.0 (L)    Attestation Statements:   This is the patient's first visit at [facility]. The patient's NEW PATIENT PACKET was reviewed at length. Included in the packet: current and past health history, medications, allergies, ROS, gynecologic history (women only), surgical history, family history, social history, weight history, weight loss surgery history (for  those that have had weight loss surgery), nutritional evaluation, mood and food questionnaire, PHQ9, Epworth questionnaire, sleep habits questionnaire, patient life and health improvement goals questionnaire. These will all be scanned into the patient's chart under media.   During the visit, I independently reviewed the patient's EKG, bioimpedance scale results, and indirect calorimeter results. I used this information to tailor a meal plan for the patient that will help her to lose weight and will improve her obesity-related conditions going forward. I performed a medically necessary appropriate examination and/or evaluation. I discussed the assessment and treatment plan with the patient. The patient was provided an opportunity to ask questions and all were answered. The patient agreed with the plan and demonstrated an understanding of the instructions. Labs were ordered at this visit and will be reviewed at the next visit unless more critical results need to be addressed immediately. Clinical information was updated and documented in the EMR.  Time spent on visit including pre-visit chart review and post-visit care was 43 minutes.   A separate 15 minutes was spent on risk counseling (see above).    I, Nevada Crane, am acting as transcriptionist for Reuben Likes, MD.  I have reviewed the above documentation for accuracy and completeness, and I agree with the above. - Debbra Riding, MD

## 2019-07-03 ENCOUNTER — Encounter (INDEPENDENT_AMBULATORY_CARE_PROVIDER_SITE_OTHER): Payer: Self-pay | Admitting: Family Medicine

## 2019-07-03 LAB — COMPREHENSIVE METABOLIC PANEL
ALT: 27 IU/L (ref 0–32)
AST: 23 IU/L (ref 0–40)
Albumin/Globulin Ratio: 1.6 (ref 1.2–2.2)
Albumin: 4.5 g/dL (ref 3.8–4.8)
Alkaline Phosphatase: 86 IU/L (ref 39–117)
BUN/Creatinine Ratio: 16 (ref 9–23)
BUN: 16 mg/dL (ref 6–24)
Bilirubin Total: 0.3 mg/dL (ref 0.0–1.2)
CO2: 16 mmol/L — ABNORMAL LOW (ref 20–29)
Calcium: 9.1 mg/dL (ref 8.7–10.2)
Chloride: 110 mmol/L — ABNORMAL HIGH (ref 96–106)
Creatinine, Ser: 1.03 mg/dL — ABNORMAL HIGH (ref 0.57–1.00)
GFR calc Af Amer: 76 mL/min/{1.73_m2} (ref >59)
GFR calc non Af Amer: 66 mL/min/{1.73_m2} (ref >59)
Globulin, Total: 2.8 g/dL (ref 1.5–4.5)
Glucose: 91 mg/dL (ref 65–99)
Potassium: 4.5 mmol/L (ref 3.5–5.2)
Sodium: 141 mmol/L (ref 134–144)
Total Protein: 7.3 g/dL (ref 6.0–8.5)

## 2019-07-03 LAB — FOLATE: Folate: 15.8 ng/mL (ref >3.0)

## 2019-07-03 LAB — LIPID PANEL WITH LDL/HDL RATIO
Cholesterol, Total: 201 mg/dL — ABNORMAL HIGH (ref 100–199)
HDL: 48 mg/dL (ref >39)
LDL Chol Calc (NIH): 135 mg/dL — ABNORMAL HIGH (ref 0–99)
LDL/HDL Ratio: 2.8 ratio (ref 0.0–3.2)
Triglycerides: 102 mg/dL (ref 0–149)
VLDL Cholesterol Cal: 18 mg/dL (ref 5–40)

## 2019-07-03 LAB — T4, FREE: Free T4: 1.06 ng/dL (ref 0.82–1.77)

## 2019-07-03 LAB — VITAMIN B12: Vitamin B-12: 515 pg/mL (ref 232–1245)

## 2019-07-03 LAB — T3: T3, Total: 93 ng/dL (ref 71–180)

## 2019-07-03 LAB — TSH: TSH: 3.06 u[IU]/mL (ref 0.450–4.500)

## 2019-07-03 LAB — INSULIN, RANDOM: INSULIN: 13.7 u[IU]/mL (ref 2.6–24.9)

## 2019-07-03 NOTE — Telephone Encounter (Signed)
Please advise 

## 2019-07-04 ENCOUNTER — Other Ambulatory Visit: Payer: Self-pay

## 2019-07-04 ENCOUNTER — Encounter (HOSPITAL_COMMUNITY): Payer: Self-pay | Admitting: Emergency Medicine

## 2019-07-04 ENCOUNTER — Emergency Department (HOSPITAL_COMMUNITY): Payer: BC Managed Care – PPO

## 2019-07-04 ENCOUNTER — Emergency Department (HOSPITAL_COMMUNITY)
Admission: EM | Admit: 2019-07-04 | Discharge: 2019-07-04 | Disposition: A | Discharge status: Home / Self Care | Payer: BC Managed Care – PPO | Attending: Emergency Medicine | Admitting: Emergency Medicine

## 2019-07-04 DIAGNOSIS — Z79899 Other long term (current) drug therapy: Secondary | ICD-10-CM | POA: Insufficient documentation

## 2019-07-04 DIAGNOSIS — I10 Essential (primary) hypertension: Secondary | ICD-10-CM | POA: Insufficient documentation

## 2019-07-04 DIAGNOSIS — R202 Paresthesia of skin: Secondary | ICD-10-CM

## 2019-07-04 DIAGNOSIS — H538 Other visual disturbances: Secondary | ICD-10-CM | POA: Diagnosis not present

## 2019-07-04 DIAGNOSIS — E669 Obesity, unspecified: Secondary | ICD-10-CM | POA: Insufficient documentation

## 2019-07-04 DIAGNOSIS — R200 Anesthesia of skin: Secondary | ICD-10-CM | POA: Diagnosis not present

## 2019-07-04 DIAGNOSIS — G43109 Migraine with aura, not intractable, without status migrainosus: Secondary | ICD-10-CM

## 2019-07-04 DIAGNOSIS — Z6837 Body mass index (BMI) 37.0-37.9, adult: Secondary | ICD-10-CM | POA: Insufficient documentation

## 2019-07-04 DIAGNOSIS — R519 Headache, unspecified: Secondary | ICD-10-CM | POA: Diagnosis present

## 2019-07-04 LAB — DIFFERENTIAL
Abs Immature Granulocytes: 0.03 10*3/uL (ref 0.00–0.07)
Basophils Absolute: 0.1 10*3/uL (ref 0.0–0.1)
Basophils Relative: 1 %
Eosinophils Absolute: 0.2 10*3/uL (ref 0.0–0.5)
Eosinophils Relative: 2 %
Immature Granulocytes: 0 %
Lymphocytes Relative: 23 %
Lymphs Abs: 2 10*3/uL (ref 0.7–4.0)
Monocytes Absolute: 0.5 10*3/uL (ref 0.1–1.0)
Monocytes Relative: 6 %
Neutro Abs: 6 10*3/uL (ref 1.7–7.7)
Neutrophils Relative %: 68 %

## 2019-07-04 LAB — I-STAT CHEM 8, ED
BUN: 21 mg/dL — ABNORMAL HIGH (ref 6–20)
Calcium, Ion: 1.15 mmol/L (ref 1.15–1.40)
Chloride: 109 mmol/L (ref 98–111)
Creatinine, Ser: 1.1 mg/dL — ABNORMAL HIGH (ref 0.44–1.00)
Glucose, Bld: 94 mg/dL (ref 70–99)
HCT: 44 % (ref 36.0–46.0)
Hemoglobin: 15 g/dL (ref 12.0–15.0)
Potassium: 3.9 mmol/L (ref 3.5–5.1)
Sodium: 139 mmol/L (ref 135–145)
TCO2: 20 mmol/L — ABNORMAL LOW (ref 22–32)

## 2019-07-04 LAB — COMPREHENSIVE METABOLIC PANEL
ALT: 34 U/L (ref 0–44)
AST: 32 U/L (ref 15–41)
Albumin: 4.1 g/dL (ref 3.5–5.0)
Alkaline Phosphatase: 73 U/L (ref 38–126)
Anion gap: 10 (ref 5–15)
BUN: 19 mg/dL (ref 6–20)
CO2: 19 mmol/L — ABNORMAL LOW (ref 22–32)
Calcium: 9.2 mg/dL (ref 8.9–10.3)
Chloride: 108 mmol/L (ref 98–111)
Creatinine, Ser: 1.09 mg/dL — ABNORMAL HIGH (ref 0.44–1.00)
GFR calc Af Amer: >60 mL/min (ref >60)
GFR calc non Af Amer: >60 mL/min (ref >60)
Glucose, Bld: 97 mg/dL (ref 70–99)
Potassium: 4 mmol/L (ref 3.5–5.1)
Sodium: 137 mmol/L (ref 135–145)
Total Bilirubin: 0.7 mg/dL (ref 0.3–1.2)
Total Protein: 7.1 g/dL (ref 6.5–8.1)

## 2019-07-04 LAB — CBC
HCT: 45.2 % (ref 36.0–46.0)
Hemoglobin: 14.6 g/dL (ref 12.0–15.0)
MCH: 28.9 pg (ref 26.0–34.0)
MCHC: 32.3 g/dL (ref 30.0–36.0)
MCV: 89.5 fL (ref 80.0–100.0)
Platelets: 273 10*3/uL (ref 150–400)
RBC: 5.05 MIL/uL (ref 3.87–5.11)
RDW: 13.4 % (ref 11.5–15.5)
WBC: 8.8 10*3/uL (ref 4.0–10.5)
nRBC: 0 % (ref 0.0–0.2)

## 2019-07-04 LAB — I-STAT BETA HCG BLOOD, ED (MC, WL, AP ONLY): I-stat hCG, quantitative: <5 m[IU]/mL (ref <5)

## 2019-07-04 LAB — PROTIME-INR
INR: 1 (ref 0.8–1.2)
Prothrombin Time: 12.7 seconds (ref 11.4–15.2)

## 2019-07-04 LAB — APTT: aPTT: 30 seconds (ref 24–36)

## 2019-07-04 MED ORDER — ACETAZOLAMIDE 250 MG PO TABS: 500.0000 mg | ORAL_TABLET | Freq: Two times a day (BID) | ORAL | 5 refills | Status: DC

## 2019-07-04 MED ORDER — DIPHENHYDRAMINE HCL 50 MG/ML IJ SOLN
25.0000 mg | Freq: Once | INTRAMUSCULAR | Status: AC
  Administered 2019-07-04: 25 mg via INTRAVENOUS
  Filled 2019-07-04: qty 1

## 2019-07-04 MED ORDER — SODIUM CHLORIDE 0.9% FLUSH: 3.0000 mL | Freq: Once | INTRAVENOUS | Status: DC

## 2019-07-04 MED ORDER — ONDANSETRON HCL 4 MG/2ML IJ SOLN
4.0000 mg | Freq: Once | INTRAMUSCULAR | Status: AC
  Administered 2019-07-04: 4 mg via INTRAVENOUS
  Filled 2019-07-04: qty 2

## 2019-07-04 MED ORDER — HYDROMORPHONE HCL 1 MG/ML IJ SOLN
0.5000 mg | Freq: Once | INTRAMUSCULAR | Status: AC
  Administered 2019-07-04: 0.5 mg via INTRAVENOUS
  Filled 2019-07-04: qty 1

## 2019-07-04 NOTE — ED Provider Notes (Signed)
[facility] DEPART[facility] Provider Note   CSN: [id number] Arrival date & time: 07/04/19  0945     History Chief Complaint  Patient presents with  . Headache  . Blurred Vision  . Diplopia    Shelley Ryan is a 44 y.o. female.  Patient c/o intermittent frontal and diffuse headaches, and intermittently left arm numbness and blurry vision. States she also feels intermittently develops a foot drop. States prior and recent evaluation for same, including seeing neurologist in past couple weeks. Currently symptoms intermittent in past 1-2 days, moderate, persistent, without specific exacerbating or alleviating factors. No abrupt, severe or 'worst' type of headache, but rather recurrent of headaches similar to prior. No continual or persistent loss of sensation or persistent weakness. Vision at times seems blurry, but no visual field cut or amaurosis. No eye pain. No fever or chills. No sinus congestion or uri symptoms. No syncope, trauma or fall.   The history is provided by the patient.  Headache Associated symptoms: numbness   Associated symptoms: no abdominal pain, no cough, no eye pain, no fever, no neck pain, no neck stiffness, no sore throat and no vomiting        Past Medical History:  Diagnosis Date  . Anemia   . Bilateral swelling of feet   . BRCA1 negative 10/08/2013  . CVA (cerebral infarction) 2002   Most likely from Hormonal Tx  . 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 Gallbladder problem   . Gastric outlet obstruction   . Gastroparesis   . H/O blood clots   . Heartburn   . Hiatal hernia   . Hyperlipidemia   . Hypertension   . IIH (idiopathic intracranial hypertension)   . Inflammatory arthritis   . Joint pain   . Low vitamin D level    2013  17  . OSA (obstructive sleep apnea)   . PCOS (polycystic ovarian syndrome)    stroke on OCP hormonal therapy  . Prediabetes   . Pylorospasm    . Stroke (cerebrum) ([place])   . Vitamin D deficiency     Patient Active Problem List   Diagnosis Date Noted  . Esophageal hiatal hernia 06/20/2019  . Stroke ([place]) 06/20/2019  . IIH (idiopathic intracranial hypertension) 06/20/2019  . 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 Myopia with astigmatism, bilateral 01/05/2019  . Posterior vitreous detachment of both eyes 01/05/2019  . Blepharitis of both upper and lower eyelid 01/05/2019  . Family history of malignant hyperthermia 05/17/2016  . GERD (gastroesophageal reflux disease) 02/25/2016  . Chronic cholecystitis with calculus 10/30/2013  . Acute cholecystitis 10/10/2013  . Status post laparoscopic cholecystectomy June 2015 10/09/2013  . Obesity (BMI 30-39.9) 10/08/2013  . PCOS (polycystic ovarian syndrome)     Past Surgical History:  Procedure Laterality Date  . APPENDECTOMY    . CHOLECYSTECTOMY N/A 10/09/2013   Procedure: LAPAROSCOPIC CHOLECYSTECTOMY WITH INTRAOPERATIVE CHOLANGIOGRAM;  Surgeon: Pedro Earls, MD;  Location: [facility]S;  Service: General;  Laterality: N/A;  . COLON SURGERY    . KNEE ARTHROSCOPY Bilateral   . PYLOROPLASTY    . RIGHT OOPHORECTOMY    . SALPINGECTOMY Bilateral   . TYMPANOSTOMY TUBE PLACEMENT     x 2  . WISDOM TOOTH EXTRACTION       OB History    Gravida  1   Para  Term      Preterm      AB      Living        SAB      TAB      Ectopic      Multiple      Live Births              Family History  Problem Relation Age of Onset  . Obesity Mother   . Hyperlipidemia Father   . Breast cancer Other        Great Grandmother   . Breast cancer Other   . Headache Neg Hx   . Migraines Neg Hx     Social History   Tobacco Use  . Smoking status: Never Smoker  . Smokeless tobacco: Never Used  Substance Use Topics  . Alcohol use: No  . Drug use: No    Home Medications Prior to Admission medications     Medication Sig Start Date End Date Taking? Authorizing Provider  acetaminophen (TYLENOL) 325 MG tablet Take 650 mg by mouth every 6 (six) hours as needed for mild pain.    Yes [provider]  acetaZOLAMIDE (DIAMOX) 250 MG tablet Take 1 tablet (250 mg total) by mouth 2 (two) times daily. 06/20/19  Yes Melvenia Beam, MD  amitriptyline (ELAVIL) 25 MG tablet Take 25 mg by mouth at bedtime. 08/09/18  Yes [provider]  Cholecalciferol (VITAMIN D-1000 MAX ST) 25 MCG (1000 UT) tablet Take 6,000 Units by mouth daily.    Yes [provider]  cimetidine (TAGAMET) 400 MG tablet Take 400 mg by mouth 2 (two) times a day. 08/28/18  Yes [provider]  Levonorgestrel 19.5 MG IUD by Intrauterine route.   Yes [provider]  losartan (COZAAR) 50 MG tablet Take 50 mg by mouth daily. 06/13/19  Yes [provider]  metFORMIN (GLUCOPHAGE) 500 MG tablet Take 500 mg by mouth 2 (two) times a day. 07/10/18  Yes [provider]  Multiple Vitamins-Minerals (MULTIVITAMIN WITH MINERALS) tablet Take 1 tablet by mouth daily.   Yes [provider]  ondansetron (ZOFRAN-ODT) 4 MG disintegrating tablet Take 4 mg by mouth every 4 (four) hours as needed for nausea or vomiting.   Yes [provider]  prochlorperazine (COMPAZINE) 10 MG tablet Take 10 mg by mouth every 6 (six) hours as needed for nausea or vomiting.  12/22/15  Yes [provider]    Allergies    Biaxin [clarithromycin], Cisapride, Estrogens, Cholestyramine, Ciprofloxacin, Keflex [cephalexin], Metronidazole, Minocycline hcl, Tetracyclines & related, Unasyn [ampicillin-sulbactam sodium], Ampicillin, Codeine, Reglan [metoclopramide], and Tape  Review of Systems   Review of Systems  Constitutional: Negative for fever.  HENT: Negative for sinus pain and sore throat.   Eyes: Positive for visual disturbance. Negative for pain and redness.  Respiratory: Negative for cough and  shortness of breath.   Cardiovascular: Negative for chest pain and leg swelling.  Gastrointestinal: Negative for abdominal pain and vomiting.  Endocrine: Negative for polyuria.  Genitourinary: Negative for dysuria and flank pain.  Musculoskeletal: Negative for neck pain and neck stiffness.  Skin: Negative for rash.  Neurological: Positive for numbness and headaches. Negative for speech difficulty.  Hematological: Does not bruise/bleed easily.  Psychiatric/Behavioral: Negative for confusion.    Physical Exam Updated Vital Signs BP (!) 124/91 (BP Location: Left Arm)   Pulse 89   Temp 98.7 F (37.1 C) (Oral)   Resp 17   Ht 1.702 m ('5\' 7"' )  Wt 109.8 kg   SpO2 98%   BMI 37.90 kg/m   Physical Exam Vitals and nursing note reviewed.  Constitutional:      Appearance: Normal appearance. She is well-developed.  HENT:     Head: Atraumatic.     Comments: No sinus or temporal tenderness.     Right Ear: Tympanic membrane normal.     Left Ear: Tympanic membrane normal.     Nose: Nose normal.     Mouth/Throat:     Mouth: Mucous membranes are moist.  Eyes:     General: No scleral icterus.    Extraocular Movements: Extraocular movements intact.     Conjunctiva/sclera: Conjunctivae normal.     Pupils: Pupils are equal, round, and reactive to light.     Comments: No papilledema.   Neck:     Vascular: No carotid bruit.     Trachea: No tracheal deviation.  Cardiovascular:     Rate and Rhythm: Normal rate and regular rhythm.     Pulses: Normal pulses.     Heart sounds: Normal heart sounds. No murmur. No friction rub. No gallop.   Pulmonary:     Effort: Pulmonary effort is normal. No respiratory distress.     Breath sounds: Normal breath sounds.  Abdominal:     General: Bowel sounds are normal. There is no distension.     Palpations: Abdomen is soft.     Tenderness: There is no abdominal tenderness. There is no guarding.  Genitourinary:    Comments: No cva tenderness.    Musculoskeletal:        General: No swelling or tenderness.     Cervical back: Normal range of motion and neck supple. No rigidity. No muscular tenderness.     Right lower leg: No edema.     Left lower leg: No edema.  Skin:    General: Skin is warm and dry.     Findings: No rash.  Neurological:     Mental Status: She is alert.     Comments: Alert, speech normal. Motor intact bil, stre 5/5. No pronator drift. Sensation grossly intact bil. Steady gait.   Psychiatric:     Comments: Anxious appearing.      ED Results / Procedures / Treatments   Labs (all labs ordered are listed, but only abnormal results are displayed) Results for orders placed or performed during the hospital encounter of 07/04/19  Protime-INR  Result Value Ref Range   Prothrombin Time 12.7 11.4 - 15.2 seconds   INR 1.0 0.8 - 1.2  APTT  Result Value Ref Range   aPTT 30 24 - 36 seconds  CBC  Result Value Ref Range   WBC 8.8 4.0 - 10.5 K/uL   RBC 5.05 3.87 - 5.11 MIL/uL   Hemoglobin 14.6 12.0 - 15.0 g/dL   HCT 45.2 36.0 - 46.0 %   MCV 89.5 80.0 - 100.0 fL   MCH 28.9 26.0 - 34.0 pg   MCHC 32.3 30.0 - 36.0 g/dL   RDW 13.4 11.5 - 15.5 %   Platelets 273 150 - 400 K/uL   nRBC 0.0 0.0 - 0.2 %  Differential  Result Value Ref Range   Neutrophils Relative % 68 %   Neutro Abs 6.0 1.7 - 7.7 K/uL   Lymphocytes Relative 23 %   Lymphs Abs 2.0 0.7 - 4.0 K/uL   Monocytes Relative 6 %   Monocytes Absolute 0.5 0.1 - 1.0 K/uL   Eosinophils Relative 2 %   Eosinophils Absolute  0.2 0.0 - 0.5 K/uL   Basophils Relative 1 %   Basophils Absolute 0.1 0.0 - 0.1 K/uL   Immature Granulocytes 0 %   Abs Immature Granulocytes 0.03 0.00 - 0.07 K/uL  Comprehensive metabolic panel  Result Value Ref Range   Sodium 137 135 - 145 mmol/L   Potassium 4.0 3.5 - 5.1 mmol/L   Chloride 108 98 - 111 mmol/L   CO2 19 (L) 22 - 32 mmol/L   Glucose, Bld 97 70 - 99 mg/dL   BUN 19 6 - 20 mg/dL   Creatinine, Ser 1.09 (H) 0.44 - 1.00 mg/dL    Calcium 9.2 8.9 - 10.3 mg/dL   Total Protein 7.1 6.5 - 8.1 g/dL   Albumin 4.1 3.5 - 5.0 g/dL   AST 32 15 - 41 U/L   ALT 34 0 - 44 U/L   Alkaline Phosphatase 73 38 - 126 U/L   Total Bilirubin 0.7 0.3 - 1.2 mg/dL   GFR calc non Af Amer >60 >60 mL/min   GFR calc Af Amer >60 >60 mL/min   Anion gap 10 5 - 15  I-stat chem 8, ED  Result Value Ref Range   Sodium 139 135 - 145 mmol/L   Potassium 3.9 3.5 - 5.1 mmol/L   Chloride 109 98 - 111 mmol/L   BUN 21 (H) 6 - 20 mg/dL   Creatinine, Ser 1.10 (H) 0.44 - 1.00 mg/dL   Glucose, Bld 94 70 - 99 mg/dL   Calcium, Ion 1.15 1.15 - 1.40 mmol/L   TCO2 20 (L) 22 - 32 mmol/L   Hemoglobin 15.0 12.0 - 15.0 g/dL   HCT 44.0 36.0 - 46.0 %  I-Stat beta hCG blood, ED  Result Value Ref Range   I-stat hCG, quantitative <5.0 <5 mIU/mL   Comment 3           CT HEAD WO CONTRAST  Result Date: 07/04/2019 CLINICAL DATA:  Intermittent vision loss. Headache and left-sided weakness EXAM: CT HEAD WITHOUT CONTRAST TECHNIQUE: Contiguous axial images were obtained from the base of the skull through the vertex without intravenous contrast. COMPARISON:  June 11, 2019 FINDINGS: Brain: Ventricles and sulci are normal in size and configuration. There is no intracranial mass, hemorrhage, extra-axial fluid collection, or midline shift. A small focus of decreased attenuation in the left anterior subcortical white matter region is stable. Elsewhere brain parenchyma appears unremarkable. No acute appearing infarct evident. Vascular: No hyperdense vessel.  No evident vascular calcification. Skull: Bony calvarium appears intact. Sinuses/Orbits: Visualized paranasal sinuses are clear. Orbits appear symmetric bilaterally. Other: Mastoid air cells are clear. IMPRESSION: Stable small area of decreased attenuation in the anterior left frontal lobe white matter. Elsewhere brain parenchyma appears unremarkable. No acute infarct evident. No mass or hemorrhage. Electronically Signed   By:  Lowella Grip III M.D.   On: 07/04/2019 11:08   MR ANGIO HEAD WO CONTRAST  Result Date: 06/11/2019 CLINICAL DATA:  44 year old female code stroke presentation, left side weakness, right hand tingling, headache and hypertensive. Patient reports history of prior stroke. EXAM: MRA HEAD WITHOUT CONTRAST TECHNIQUE: Angiographic images of the Circle of Willis were obtained using MRA technique without intravenous contrast. COMPARISON:  Neck MRA and brain MRI today reported separately. FINDINGS: Antegrade flow in the posterior circulation with codominant distal vertebral arteries. No distal vertebral or vertebrobasilar junction stenosis. Both a ICAs appear dominant and patent proximally. Patent basilar artery without stenosis. SCA and PCA origins are normal. Both posterior communicating arteries are present. The  right PCA is remarkable for mild right distal P1 segment irregularity and stenosis which is also suggested on the postcontrast MRA today. The proximal left PCA appears normal. The distal PCA branches appear symmetric and within normal limits. Antegrade flow in both ICA siphons. No siphon stenosis. Ophthalmic and posterior communicating artery origins appear within normal limits. Patent carotid termini. Normal MCA and ACA origins. Normal anterior communicating artery. Visible bilateral ACA branches are normal. Left MCA M1 segment and bifurcation appear normal. Visible left MCA branches are within normal limits. The right MCA bifurcates early without stenosis. Visible right MCA branches are within normal limits. IMPRESSION: 1. Mild irregularity and stenosis of the Right PCA distal P1 segment. 2. Otherwise negative intracranial MRA. Electronically Signed   By: Genevie Ann M.D.   On: 06/11/2019 22:10   MR ANGIO NECK W WO CONTRAST  Result Date: 06/11/2019 CLINICAL DATA:  44 year old female code stroke presentation, left side weakness, right hand tingling, headache and hypertensive. Patient reports history of  prior stroke. EXAM: MRA NECK WITHOUT AND WITH CONTRAST TECHNIQUE: Multiplanar and multiecho pulse sequences of the neck were obtained without and with intravenous contrast. Angiographic images of the neck were obtained using MRA technique without and with intravenous contrast. CONTRAST:  86m GADAVIST GADOBUTROL 1 MMOL/ML IV SOLN COMPARISON:  Brain MRI today reported separately. FINDINGS: Precontrast time-of-flight images demonstrate antegrade flow signal in both cervical carotid and vertebral arteries to the skull base. The left carotid bifurcation is somewhat early and the right is partially retropharyngeal. The vertebral arteries appear codominant. Post-contrast neck MRA images demonstrate a bovine type arch configuration. Only a portion of the left subclavian artery origin is included. Proximal great vessels otherwise appear normal. The left vertebral artery origin is also only partially included but is suspected to be normal. The right vertebral artery origin is normal. The vertebrals are codominant to the skull base without stenosis. Negative visible posterior circulation. Mild tortuosity of the proximal left CCA. Negative left carotid bifurcation. Negative cervical left ICA. Negative visible left ICA siphon. Negative right CCA and right carotid bifurcation. Mildly tortuous but otherwise negative cervical right ICA. Negative visible right ICA siphon. IMPRESSION: Negative neck MRA aside from mild carotid tortuosity. Electronically Signed   By: HGenevie AnnM.D.   On: 06/11/2019 22:05   MR BRAIN WO CONTRAST  Result Date: 06/11/2019 CLINICAL DATA:  44year old female code stroke presentation, left side weakness, right hand tingling, headache and hypertensive. Patient reports history of prior stroke. EXAM: MRI HEAD WITHOUT CONTRAST TECHNIQUE: Multiplanar, multiecho pulse sequences of the brain and surrounding structures were obtained without intravenous contrast. COMPARISON:  Plain head CT earlier tonight.  FINDINGS: Brain: No restricted diffusion to suggest acute infarction. No midline shift, mass effect, evidence of mass lesion, ventriculomegaly, extra-axial collection or acute intracranial hemorrhage. Cervicomedullary junction within normal limits. Partially empty sella. Scattered generally small foci of cerebral white matter T2 and FLAIR hyperintensity, many subcortical. There are patchy areas of involvement in the bilateral superior frontal gyri, and near the frontal horns more so on the left. No cortical encephalomalacia identified. No chronic cerebral blood products. The deep gray nuclei, brainstem and cerebellum appear within normal limits. Vascular: Major intracranial vascular flow voids are preserved. Skull and upper cervical spine: Negative visible cervical spine. Visualized bone marrow signal is within normal limits. Sinuses/Orbits: Negative orbits. Paranasal sinuses are clear. Other: Mastoids are clear. Visible internal auditory structures appear normal. Scalp and face soft tissues appear negative. IMPRESSION: 1. No acute intracranial abnormality. 2. Moderately  advanced for age but nonspecific cerebral white matter signal changes. 3. Partially empty sella, can be a normal anatomic variation but also is associated with idiopathic intracranial hypertension (pseudotumor cerebri). Electronically Signed   By: Genevie Ann M.D.   On: 06/11/2019 21:56   MR MRV HEAD W WO CONTRAST  Result Date: 06/11/2019 CLINICAL DATA:  44 year old female code stroke presentation, left side weakness, right hand tingling, headache and hypertensive. Patient reports history of prior stroke. EXAM: MR VENOGRAM HEAD WITHOUT AND WITH CONTRAST TECHNIQUE: Angiographic images of the intracranial venous structures were obtained using MRV technique without and with intravenous contrast. COMPARISON:  Brain MRI and intracranial MRA today reported separately. FINDINGS: Precontrast time-of-flight images demonstrate preserved flow signal in the  superior sagittal sinus, torcula, straight sinus, vein of Galen, internal cerebral veins, basal veins of Rosenthal, bilateral transverse sinuses, bilateral sigmoid sinuses and IJ bulbs. There is also a patent small midline occipital sinus (normal variant). Postcontrast MRV images demonstrate preserved enhancement in those vessels. No abnormal enhancement identified. IMPRESSION: Normal intracranial MRV. Electronically Signed   By: Genevie Ann M.D.   On: 06/11/2019 22:14   CT HEAD CODE STROKE WO CONTRAST  Result Date: 06/11/2019 CLINICAL DATA:  Code stroke. 44 year old female last seen normal 1600 hours. Left side weakness. EXAM: CT HEAD WITHOUT CONTRAST TECHNIQUE: Contiguous axial images were obtained from the base of the skull through the vertex without intravenous contrast. COMPARISON:  Report of brain MRI 09/05/2002 (no images available). FINDINGS: Brain: Partially empty sella.  Otherwise normal cerebral volume. No midline shift, ventriculomegaly, mass effect, evidence of mass lesion, intracranial hemorrhage or evidence of cortically based acute infarction. Mild left anterior frontal lobe subcortical white matter hypodensity on series 2, image 20. Elsewhere gray-white matter differentiation is within normal limits. Vascular: No suspicious intracranial vascular hyperdensity. Skull: Negative. Sinuses/Orbits: Visualized paranasal sinuses and mastoids are clear. Other: Negative orbit and scalp soft tissues. ASPECTS [facility] Stroke Program Early CT Score) Total score (0-10 with 10 being normal): 10 IMPRESSION: 1. No acute cortically based infarct or intracranial hemorrhage identified. 2. Mild nonspecific white matter changes, mostly in the left frontal lobe. 3. These results were communicated to Dr. Leonel Ramsay at 6:35 pm on 06/11/2019 by text page via the [facility] messaging system. Electronically Signed   By: Genevie Ann M.D.   On: 06/11/2019 18:36   DG FLUORO GUIDED LOC OF NEEDLE/CATH TIP FOR SPINAL INJECT LT  Result  Date: 06/20/2019 CLINICAL DATA:  Headache.  Suspected intracranial hypertension. EXAM: DIAGNOSTIC LUMBAR PUNCTURE UNDER FLUOROSCOPIC GUIDANCE FLUOROSCOPY TIME:  Fluoroscopy Time:  17 seconds Radiation Exposure Index (if provided by the fluoroscopic device): 3.2 mGy Number of Acquired Spot Images: 0 PROCEDURE: Informed consent was obtained from the patient prior to the procedure, including potential complications of headache, allergy, and pain. With the patient prone, the lower back was prepped with Betadine. 1% Lidocaine was used for local anesthesia. Lumbar puncture was performed at the L3-L4 level via a right interlaminar approach using a 6 inch 20 gauge needle with return of clear, colorless CSF with an opening pressure of 22 cm water. 11 ml of CSF were obtained for laboratory studies. Closing pressure was 15 cm water. The patient tolerated the procedure well and there were no apparent complications. IMPRESSION: 1. Technically successful fluoroscopically guided lumbar puncture. 2. Mildly elevated opening CSF pressure of 22 cm water. Electronically Signed   By: Titus Dubin M.D.   On: 06/20/2019 10:34    EKG EKG Interpretation  Date/Time:  Wednesday July 04 2019 09:54:01 EST Ventricular Rate:  93 PR Interval:  158 QRS Duration: 86 QT Interval:  346 QTC Calculation: 430 R Axis:   44 Text Interpretation: Normal sinus rhythm with sinus arrhythmia Low voltage QRS Confirmed by Lajean Saver [phone]) on 07/04/2019 10:46:42 AM   Radiology CT HEAD WO CONTRAST  Result Date: 07/04/2019 CLINICAL DATA:  Intermittent vision loss. Headache and left-sided weakness EXAM: CT HEAD WITHOUT CONTRAST TECHNIQUE: Contiguous axial images were obtained from the base of the skull through the vertex without intravenous contrast. COMPARISON:  June 11, 2019 FINDINGS: Brain: Ventricles and sulci are normal in size and configuration. There is no intracranial mass, hemorrhage, extra-axial fluid collection, or midline  shift. A small focus of decreased attenuation in the left anterior subcortical white matter region is stable. Elsewhere brain parenchyma appears unremarkable. No acute appearing infarct evident. Vascular: No hyperdense vessel.  No evident vascular calcification. Skull: Bony calvarium appears intact. Sinuses/Orbits: Visualized paranasal sinuses are clear. Orbits appear symmetric bilaterally. Other: Mastoid air cells are clear. IMPRESSION: Stable small area of decreased attenuation in the anterior left frontal lobe white matter. Elsewhere brain parenchyma appears unremarkable. No acute infarct evident. No mass or hemorrhage. Electronically Signed   By: Lowella Grip III M.D.   On: 07/04/2019 11:08    Procedures Procedures (including critical care time)  Medications Ordered in ED Medications  sodium chloride flush (NS) 0.9 % injection 3 mL (3 mLs Intravenous Not Given 07/04/19 1050)    ED Course  I have reviewed the triage vital signs and the nursing notes.  Pertinent labs & imaging results that were available during my care of the patient were reviewed by me and considered in my medical decision making (see chart for details).    MDM Rules/Calculators/A&P                      Iv ns. Stat labs and imaging.   Reviewed nursing notes and prior charts for additional history.  On review prior notes/records - pt with recent imaging for same last month, including CT, MRI, MRA/MRV - neg for acute process.  During that visit neurologist mentions some inconsistency in neuro exam, and questions possibly non physiological symptoms.  Note made of prior opthy eval of no papilledema on eye exam. Patient did have recent LP for same symptoms - opening pressure was to the upper end of the normal range (22).  Of note, pt has already been placed on acetazolamide. With open pressure in normal range, normal ophthy exam, unusual extremity neurologic symptoms,  and paucity of objective findings to support dx,  diagnosis of [place] seems very unclear.  No consistent, focal deficit on today's exam. No papilledema.   Will give medication for headache/symptom relief.   Labs reviewed/interpreted by me - wbc normal, hgb normal, k normal.   CT reviewed/interpreted by me - no acute hem or other acute abn compared to prior.   No focal findings noted on neurologic exam. Vitals normal. Pt appears comfortable and in nad - pt currently appears stable for d/c.   Rec close outpt neurology f/u.  Return precautions provided.      Final Clinical Impression(s) / ED Diagnoses Final diagnoses:  None    Rx / DC Orders ED Discharge Orders    None       Lajean Saver, MD 07/04/19 1156

## 2019-07-04 NOTE — Telephone Encounter (Signed)
Dr. Lucia Gaskins aware and has ordered diamox increase to 500 mg twice daily. Appt not needed at this time.

## 2019-07-04 NOTE — Discharge Instructions (Signed)
It was our pleasure to provide your ER care today - we hope that you feel better.  Rest. Drink plenty of fluids.   Follow up closely with your neurologist in the next few days - call office today to arrange follow up.   Return to ER if worse, new  or worsening symptoms, fevers, persistent vomiting, trouble breathing, or other concern.   You were given pain medication in the ER - no driving for the next 6 hours.

## 2019-07-04 NOTE — ED Notes (Signed)
Patient Alert and oriented to baseline. Stable and ambulatory to baseline. Patient verbalized understanding of the discharge instructions.  Patient belongings were taken by the patient.   

## 2019-07-04 NOTE — ED Triage Notes (Signed)
C/o headache since waking up yesterday morning.  LKW 3/8.  Reports intermittent vision loss, blurred vision, and double vision with L arm drift.  States she talked to neurologist and was told to come to ED.

## 2019-07-05 ENCOUNTER — Other Ambulatory Visit: Payer: Self-pay | Admitting: Neurology

## 2019-07-05 MED ORDER — METHYLPREDNISOLONE 4 MG PO TBPK: ORAL_TABLET | ORAL | 1 refills | Status: DC

## 2019-07-05 NOTE — Telephone Encounter (Signed)
Please review

## 2019-07-09 ENCOUNTER — Telehealth: Payer: Self-pay | Admitting: Neurology

## 2019-07-09 NOTE — Telephone Encounter (Signed)
FYI no call back requested, pt called because she has not heard from anyone re: being scheduled to see Shelley Millet, NP.  Pt now scheduled for 04-06 with a check in of 9:00

## 2019-07-09 NOTE — Telephone Encounter (Signed)
FYI megan thanks

## 2019-07-09 NOTE — Telephone Encounter (Signed)
Ladona Ridgel, would you call patient and give her a follow up with [facility], looks like she has some openings next week. I would like Megan to see her.

## 2019-07-16 ENCOUNTER — Other Ambulatory Visit: Payer: Self-pay

## 2019-07-16 ENCOUNTER — Encounter (INDEPENDENT_AMBULATORY_CARE_PROVIDER_SITE_OTHER): Payer: Self-pay | Admitting: Family Medicine

## 2019-07-16 ENCOUNTER — Ambulatory Visit (INDEPENDENT_AMBULATORY_CARE_PROVIDER_SITE_OTHER): Payer: BC Managed Care – PPO | Admitting: Family Medicine

## 2019-07-16 VITALS — BP 118/81 | HR 99 | Temp 98.0°F | Ht 67.0 in | Wt 238.0 lb

## 2019-07-16 DIAGNOSIS — R7303 Prediabetes: Secondary | ICD-10-CM | POA: Diagnosis not present

## 2019-07-16 DIAGNOSIS — E7849 Other hyperlipidemia: Secondary | ICD-10-CM | POA: Diagnosis not present

## 2019-07-16 DIAGNOSIS — Z9189 Other specified personal risk factors, not elsewhere classified: Secondary | ICD-10-CM

## 2019-07-16 DIAGNOSIS — E559 Vitamin D deficiency, unspecified: Secondary | ICD-10-CM

## 2019-07-16 DIAGNOSIS — Z6838 Body mass index (BMI) 38.0-38.9, adult: Secondary | ICD-10-CM

## 2019-07-16 MED ORDER — VITAMIN D (ERGOCALCIFEROL) 1.25 MG (50000 UNIT) PO CAPS: 50000.0000 [IU] | ORAL_CAPSULE | ORAL | 0 refills | Status: DC

## 2019-07-16 NOTE — Progress Notes (Signed)
Chief Complaint:   OBESITY Shelley Ryan is here to discuss her progress with her obesity treatment plan along with follow-up of her obesity related diagnoses. [name] is on the Category 2 Plan and states she is following her eating plan approximately 90% of the time. [name] states she is exercising 0 minutes 0 times per week.  Today's visit was #: 2 Starting weight: 242 lbs Starting date: 07/02/2019 Today's weight: 238 lbs Today's date: 07/16/2019 Total lbs lost to date: 4 Total lbs lost since last in-office visit: 4  Interim History: Shelley Ryan had significant headaches the past 2 weeks and has been on a steroid dose pack. She reported an increase in hunger but she didn't eat more. She has significant pain and has plans to return to Neurology for pain management April 6. She reports breaking down her meals and eating throughout the day to get all of the food in. She occasionally will do 2 separate meals for dinner. She is doing Development worker, community for lunch. Her goal over the next few weeks is to get pain management plan with Neurology.  Subjective:   Prediabetes. [name] has a diagnosis of prediabetes based on her elevated HgA1c and was informed this puts her at greater risk of developing diabetes. She continues to work on diet and exercise to decrease her risk of diabetes. She denies nausea or hypoglycemia. No cravings. She is on metformin 500 mg BID.  Lab Results  Component Value Date   HGBA1C 6.1 (H) 06/19/2019   Lab Results  Component Value Date   INSULIN 13.7 07/02/2019   Vitamin D deficiency. No nausea, vomiting, or muscle weakness. Shelley Ryan is on 6000 IU daily. She endorses fatigue. Last Vitamin D 22.0 on 06/19/2019.  Other hyperlipidemia. Shelley Ryan is not on a statin. No myalgias. 10-year ASCVD risk score is 0.9%.  Lab Results  Component Value Date   CHOL 201 (H) 07/02/2019   HDL 48 07/02/2019   LDLCALC 135 (H) 07/02/2019   TRIG 102 07/02/2019   Lab Results  Component Value  Date   ALT 34 07/04/2019   AST 32 07/04/2019   ALKPHOS 73 07/04/2019   BILITOT 0.7 07/04/2019   The ASCVD Risk score Denman George DC Jr., et al., 2013) failed to calculate for the following reasons:   The patient has a prior MI or stroke diagnosis  At risk for osteoporosis. Shelley Ryan is at higher risk of osteopenia and osteoporosis due to Vitamin D deficiency.   Assessment/Plan:   Prediabetes. Shelley Ryan will continue to work on weight loss, exercise, and decreasing simple carbohydrates to help decrease the risk of diabetes. Will repeat labs in 3 months.  Vitamin D deficiency. Low Vitamin D level contributes to fatigue and are associated with obesity, breast, and colon cancer. She will stop OTC Vitamin D and will start prescription Vitamin D, Ergocalciferol, (DRISDOL) 1.25 MG (50000 UNIT) CAPS capsule every week #4 with 0 refills and will follow-up for routine testing of Vitamin D, at least 2-3 times per year to avoid over-replacement.    Other hyperlipidemia. Cardiovascular risk and specific lipid/LDL goals reviewed.  We discussed several lifestyle modifications today and Doni will continue to work on diet, exercise and weight loss efforts. Orders and follow up as documented in patient record. Will repeat labs in 3 months.  Counseling Intensive lifestyle modifications are the first line treatment for this issue. . Dietary changes: Increase soluble fiber. Decrease simple carbohydrates. . Exercise changes: Moderate to vigorous-intensity aerobic activity 150 minutes per week if  tolerated. . Lipid-lowering medications: see documented in medical record.  At risk for osteoporosis. Shelley Ryan was given approximately 30 minutes of osteoporosis prevention counseling today. Shelley Ryan is at risk for osteopenia and osteoporosis due to her Vitamin D deficiency. She was encouraged to take her Vitamin D and follow her higher calcium diet and increase strengthening exercise to help strengthen her bones and decrease her risk of  osteopenia and osteoporosis.  Repetitive spaced learning was employed today to elicit superior memory formation and behavioral change.  Class 2 severe obesity with serious comorbidity and body mass index (BMI) of 38.0 to 38.9 in adult, unspecified obesity type ([place]).  Shelley Ryan is currently in the action stage of change. As such, her goal is to continue with weight loss efforts. She has agreed to the Category 2 Plan with 6 oz at dinner and protein substitute for an additional snack.   Exercise goals: No exercise has been prescribed at this time.  Behavioral modification strategies: increasing lean protein intake, increasing vegetables, meal planning and cooking strategies and keeping healthy foods in the home.  Shelley Ryan has agreed to follow-up with our clinic in 2 weeks. She was informed of the importance of frequent follow-up visits to maximize her success with intensive lifestyle modifications for her multiple health conditions.   Objective:   Blood pressure 118/81, pulse 99, temperature 98 F (36.7 C), temperature source Oral, height 5\' 7"  (1.702 m), weight 238 lb (108 kg), last menstrual period 07/04/2019, SpO2 98 %. Body mass index is 37.28 kg/m.  General: Cooperative, alert, well developed, in no acute distress. HEENT: Conjunctivae and lids unremarkable. Cardiovascular: Regular rhythm.  Lungs: Normal work of breathing. Neurologic: No focal deficits.   Lab Results  Component Value Date   CREATININE 1.09 (H) 07/04/2019   BUN 19 07/04/2019   NA 137 07/04/2019   K 4.0 07/04/2019   CL 108 07/04/2019   CO2 19 (L) 07/04/2019   Lab Results  Component Value Date   ALT 34 07/04/2019   AST 32 07/04/2019   ALKPHOS 73 07/04/2019   BILITOT 0.7 07/04/2019   Lab Results  Component Value Date   HGBA1C 6.1 (H) 06/19/2019   Lab Results  Component Value Date   INSULIN 13.7 07/02/2019   Lab Results  Component Value Date   TSH 3.060 07/02/2019   Lab Results  Component Value Date    CHOL 201 (H) 07/02/2019   HDL 48 07/02/2019   LDLCALC 135 (H) 07/02/2019   TRIG 102 07/02/2019   Lab Results  Component Value Date   WBC 8.8 07/04/2019   HGB 14.6 07/04/2019   HCT 45.2 07/04/2019   MCV 89.5 07/04/2019   PLT 273 07/04/2019   No results found for: IRON, TIBC, FERRITIN  Attestation Statements:   Reviewed by clinician on day of visit: allergies, medications, problem list, medical history, surgical history, family history, social history, and previous encounter notes.  I, Michaelene Song, am acting as transcriptionist for Coralie Common, MD   I have reviewed the above documentation for accuracy and completeness, and I agree with the above. - Ilene Qua, MD

## 2019-07-25 ENCOUNTER — Ambulatory Visit
Admission: RE | Admit: 2019-07-25 | Discharge: 2019-07-25 | Disposition: A | Discharge status: Home / Self Care | Payer: BC Managed Care – PPO | Source: Ambulatory Visit | Attending: Family Medicine | Admitting: Family Medicine

## 2019-07-25 ENCOUNTER — Other Ambulatory Visit: Payer: Self-pay

## 2019-07-25 DIAGNOSIS — Z1231 Encounter for screening mammogram for malignant neoplasm of breast: Secondary | ICD-10-CM

## 2019-07-26 ENCOUNTER — Other Ambulatory Visit: Payer: Self-pay | Admitting: Family Medicine

## 2019-07-26 DIAGNOSIS — R928 Other abnormal and inconclusive findings on diagnostic imaging of breast: Secondary | ICD-10-CM

## 2019-07-29 ENCOUNTER — Encounter (HOSPITAL_BASED_OUTPATIENT_CLINIC_OR_DEPARTMENT_OTHER): Payer: Self-pay | Admitting: *Deleted

## 2019-07-29 ENCOUNTER — Other Ambulatory Visit: Payer: Self-pay

## 2019-07-29 ENCOUNTER — Emergency Department (HOSPITAL_BASED_OUTPATIENT_CLINIC_OR_DEPARTMENT_OTHER): Payer: BC Managed Care – PPO

## 2019-07-29 ENCOUNTER — Emergency Department (HOSPITAL_BASED_OUTPATIENT_CLINIC_OR_DEPARTMENT_OTHER)
Admission: EM | Admit: 2019-07-29 | Discharge: 2019-07-29 | Disposition: A | Discharge status: Home / Self Care | Payer: BC Managed Care – PPO | Attending: Emergency Medicine | Admitting: Emergency Medicine

## 2019-07-29 DIAGNOSIS — Z8673 Personal history of transient ischemic attack (TIA), and cerebral infarction without residual deficits: Secondary | ICD-10-CM | POA: Diagnosis not present

## 2019-07-29 DIAGNOSIS — R519 Headache, unspecified: Secondary | ICD-10-CM | POA: Diagnosis present

## 2019-07-29 DIAGNOSIS — R7303 Prediabetes: Secondary | ICD-10-CM | POA: Diagnosis not present

## 2019-07-29 DIAGNOSIS — I10 Essential (primary) hypertension: Secondary | ICD-10-CM | POA: Diagnosis not present

## 2019-07-29 DIAGNOSIS — Z79899 Other long term (current) drug therapy: Secondary | ICD-10-CM | POA: Diagnosis not present

## 2019-07-29 DIAGNOSIS — Z7984 Long term (current) use of oral hypoglycemic drugs: Secondary | ICD-10-CM | POA: Diagnosis not present

## 2019-07-29 DIAGNOSIS — G43109 Migraine with aura, not intractable, without status migrainosus: Secondary | ICD-10-CM | POA: Diagnosis not present

## 2019-07-29 LAB — CBC WITH DIFFERENTIAL/PLATELET
Abs Immature Granulocytes: 0.01 10*3/uL (ref 0.00–0.07)
Basophils Absolute: 0.1 10*3/uL (ref 0.0–0.1)
Basophils Relative: 1 %
Eosinophils Absolute: 0.2 10*3/uL (ref 0.0–0.5)
Eosinophils Relative: 2 %
HCT: 43.3 % (ref 36.0–46.0)
Hemoglobin: 14.1 g/dL (ref 12.0–15.0)
Immature Granulocytes: 0 %
Lymphocytes Relative: 23 %
Lymphs Abs: 2.2 10*3/uL (ref 0.7–4.0)
MCH: 29.7 pg (ref 26.0–34.0)
MCHC: 32.6 g/dL (ref 30.0–36.0)
MCV: 91.4 fL (ref 80.0–100.0)
Monocytes Absolute: 0.7 10*3/uL (ref 0.1–1.0)
Monocytes Relative: 8 %
Neutro Abs: 6.2 10*3/uL (ref 1.7–7.7)
Neutrophils Relative %: 66 %
Platelets: 209 10*3/uL (ref 150–400)
RBC: 4.74 MIL/uL (ref 3.87–5.11)
RDW: 14.1 % (ref 11.5–15.5)
WBC: 9.3 10*3/uL (ref 4.0–10.5)
nRBC: 0 % (ref 0.0–0.2)

## 2019-07-29 LAB — COMPREHENSIVE METABOLIC PANEL
ALT: 32 U/L (ref 0–44)
AST: 24 U/L (ref 15–41)
Albumin: 4 g/dL (ref 3.5–5.0)
Alkaline Phosphatase: 58 U/L (ref 38–126)
Anion gap: 9 (ref 5–15)
BUN: 21 mg/dL — ABNORMAL HIGH (ref 6–20)
CO2: 20 mmol/L — ABNORMAL LOW (ref 22–32)
Calcium: 9.1 mg/dL (ref 8.9–10.3)
Chloride: 109 mmol/L (ref 98–111)
Creatinine, Ser: 1.1 mg/dL — ABNORMAL HIGH (ref 0.44–1.00)
GFR calc Af Amer: >60 mL/min (ref >60)
GFR calc non Af Amer: >60 mL/min (ref >60)
Glucose, Bld: 103 mg/dL — ABNORMAL HIGH (ref 70–99)
Potassium: 3.6 mmol/L (ref 3.5–5.1)
Sodium: 138 mmol/L (ref 135–145)
Total Bilirubin: 0.4 mg/dL (ref 0.3–1.2)
Total Protein: 7.1 g/dL (ref 6.5–8.1)

## 2019-07-29 MED ORDER — DROPERIDOL 2.5 MG/ML IJ SOLN
2.5000 mg | Freq: Once | INTRAMUSCULAR | Status: AC
  Administered 2019-07-29: 2.5 mg via INTRAVENOUS

## 2019-07-29 MED ORDER — DROPERIDOL 2.5 MG/ML IJ SOLN
INTRAMUSCULAR | Status: AC
  Filled 2019-07-29: qty 2

## 2019-07-29 MED ORDER — SODIUM CHLORIDE 0.9 % IV BOLUS
500.0000 mL | Freq: Once | INTRAVENOUS | Status: AC
  Administered 2019-07-29: 500 mL via INTRAVENOUS

## 2019-07-29 NOTE — ED Triage Notes (Signed)
Pt has Hx of pseudo tumor cerebrii- Reports double vision last night, left eye feels painful and is hard to close. +nausea. Headache 8/10

## 2019-07-29 NOTE — ED Provider Notes (Signed)
[facility] EMERGENCY DEPARTMENT Provider Note   CSN: [id number] Arrival date & time: 07/29/19  1657     History Chief Complaint  Patient presents with  . Headache    Shelley Ryan is a 44 y.o. female.  The history is provided by the patient and medical records. No language interpreter was used.  Headache  Shelley Ryan is a 44 y.o. female who presents to the Emergency Department complaining of headache.  She presents for evaluation of left sided headache that began two days ago.  Pain is described as sharp in nature.  Pain is constant.  She has associated tinnitus, neck pain, which are common sxs with her headache.  She also has left sided facial droop around 330pm, which is not normal for her.  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  She was seen and admitted to [facility] at that time.  Has some nausea.  She took a zofran today and her nausea has since resolved.  She has not taken anything for the headache at home.  She has a hx/o headache and pseudotumor and is followed by Neurology.  She had been on indomethacin for headache but had to discontinue the medication due to elevation in LFT, BMP.    Denies fevers, vomiting.     Past Medical History:  Diagnosis Date  . Anemia   . Bilateral swelling of feet   . BRCA1 negative 10/08/2013  . CVA (cerebral infarction) 2002   Most likely from Hormonal Tx  . 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 Gallbladder problem   . Gastric outlet obstruction   . Gastroparesis   . H/O blood clots   . Heartburn   . Hiatal hernia   . Hyperlipidemia   . Hypertension   . IIH (idiopathic intracranial hypertension)   . Inflammatory arthritis   . Joint pain   . Low vitamin D level    2013  17  . OSA (obstructive sleep apnea)   . PCOS (polycystic ovarian syndrome)    stroke on OCP hormonal therapy  . Prediabetes   . Pylorospasm     . Stroke (cerebrum) ([place])   . Vitamin D deficiency     Patient Active Problem List   Diagnosis Date Noted  . Esophageal hiatal hernia 06/20/2019  . Stroke ([place]) 06/20/2019  . IIH (idiopathic intracranial hypertension) 06/20/2019  . 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 Myopia with astigmatism, bilateral 01/05/2019  . Posterior vitreous detachment of both eyes 01/05/2019  . Blepharitis of both upper and lower eyelid 01/05/2019  . Family history of malignant hyperthermia 05/17/2016  . GERD (gastroesophageal reflux disease) 02/25/2016  . Chronic cholecystitis with calculus 10/30/2013  . Acute cholecystitis 10/10/2013  . Status post laparoscopic cholecystectomy June 2015 10/09/2013  . Obesity (BMI 30-39.9) 10/08/2013  . PCOS (polycystic ovarian syndrome)     Past Surgical History:  Procedure Laterality Date  . APPENDECTOMY    . CHOLECYSTECTOMY N/A 10/09/2013   Procedure: LAPAROSCOPIC CHOLECYSTECTOMY WITH INTRAOPERATIVE CHOLANGIOGRAM;  Surgeon: Pedro Earls, MD;  Location: [facility] ORS;  Service: General;  Laterality: N/A;  . COLON SURGERY    . KNEE ARTHROSCOPY Bilateral   . PYLOROPLASTY    . RIGHT OOPHORECTOMY    . SALPINGECTOMY Bilateral   . TYMPANOSTOMY TUBE PLACEMENT  x 2  . WISDOM TOOTH EXTRACTION       OB History    Gravida  1   Para      Term      Preterm      AB      Living        SAB      TAB      Ectopic      Multiple      Live Births              Family History  Problem Relation Age of Onset  . Obesity Mother   . Hyperlipidemia Father   . Breast cancer Other        Great Grandmother   . Breast cancer Other   . Headache Neg Hx   . Migraines Neg Hx     Social History   Tobacco Use  . Smoking status: Never Smoker  . Smokeless tobacco: Never Used  Substance Use Topics  . Alcohol use: No  . Drug use: No    Home Medications Prior to Admission medications    Medication Sig Start Date End Date Taking? Authorizing Provider  acetaminophen (TYLENOL) 325 MG tablet Take 650 mg by mouth every 6 (six) hours as needed for mild pain.    Yes [provider]  acetaZOLAMIDE (DIAMOX) 250 MG tablet Take 2 tablets (500 mg total) by mouth 2 (two) times daily. 07/04/19  Yes Melvenia Beam, MD  amitriptyline (ELAVIL) 25 MG tablet Take 25 mg by mouth at bedtime. 08/09/18  Yes [provider]  famotidine (PEPCID) 40 MG tablet Take 40 mg by mouth 2 (two) times daily.   Yes [provider]  losartan (COZAAR) 50 MG tablet Take 50 mg by mouth daily. 06/13/19  Yes [provider]  metFORMIN (GLUCOPHAGE) 500 MG tablet Take 500 mg by mouth 2 (two) times a day. 07/10/18  Yes [provider]  Multiple Vitamins-Minerals (MULTIVITAMIN WITH MINERALS) tablet Take 1 tablet by mouth daily.   Yes [provider]  ondansetron (ZOFRAN-ODT) 4 MG disintegrating tablet Take 4 mg by mouth every 4 (four) hours as needed for nausea or vomiting.   Yes [provider]  Vitamin D, Ergocalciferol, (DRISDOL) 1.25 MG (50000 UNIT) CAPS capsule Take 1 capsule (50,000 Units total) by mouth every 7 (seven) days. 07/16/19  Yes Eber Jones, MD  Cholecalciferol (VITAMIN D-1000 MAX ST) 25 MCG (1000 UT) tablet Take 6,000 Units by mouth daily.     [provider]  cimetidine (TAGAMET) 400 MG tablet Take 400 mg by mouth 2 (two) times a day. 08/28/18   [provider]  Levonorgestrel 19.5 MG IUD by Intrauterine route.    [provider]  nizatidine (AXID) 150 MG capsule Take 150 mg by mouth 2 (two) times daily.    [provider]  prochlorperazine (COMPAZINE) 10 MG tablet Take 10 mg by mouth every 6 (six) hours as needed for nausea or vomiting.  12/22/15   [provider]    Allergies    Biaxin [clarithromycin], Cisapride, Estrogens, Cholestyramine, Ciprofloxacin, Keflex [cephalexin], Metronidazole,  Minocycline hcl, Tetracyclines & related, Unasyn [ampicillin-sulbactam sodium], Ampicillin, Codeine, Reglan [metoclopramide], and Tape  Review of Systems   Review of Systems  Neurological: Positive for headaches.  All other systems reviewed and are negative.   Physical Exam Updated Vital Signs BP 115/73   Pulse 79   Temp 98.3 F (36.8 C) (Oral)   Resp 16   Ht  '5\' 7"'  (1.702 m)   Wt 107.9 kg   LMP 07/29/2019   SpO2 100%   BMI 37.26 kg/m   Physical Exam Vitals and nursing note reviewed.  Constitutional:      Appearance: She is well-developed.  HENT:     Head: Normocephalic and atraumatic.     Mouth/Throat:     Mouth: Mucous membranes are moist.  Eyes:     Extraocular Movements: Extraocular movements intact.     Pupils: Pupils are equal, round, and reactive to light.  Cardiovascular:     Rate and Rhythm: Normal rate and regular rhythm.     Heart sounds: No murmur.  Pulmonary:     Effort: Pulmonary effort is normal. No respiratory distress.     Breath sounds: Normal breath sounds.  Abdominal:     Palpations: Abdomen is soft.     Tenderness: There is no abdominal tenderness. There is no guarding or rebound.  Musculoskeletal:        General: No swelling or tenderness.  Skin:    General: Skin is warm and dry.  Neurological:     Mental Status: She is alert and oriented to person, place, and time.     Comments: No assymmetry of facial movement.  Visual fields grossly intact.  Altered sensation to light touch in the left lower face.  Slight pronator drift in LUE.  5/5 grip strength in BUE.  5/5 strength in BLE but there is some drift in the LLE.    Psychiatric:        Behavior: Behavior normal.     ED Results / Procedures / Treatments   Labs (all labs ordered are listed, but only abnormal results are displayed) Labs Reviewed  COMPREHENSIVE METABOLIC PANEL - Abnormal; Notable for the following components:      Result Value   CO2 20 (*)    Glucose, Bld 103 (*)    BUN  21 (*)    Creatinine, Ser 1.10 (*)    All other components within normal limits  CBC WITH DIFFERENTIAL/PLATELET    EKG EKG Interpretation  Date/Time:  Sunday July 29 2019 18:01:52 EDT Ventricular Rate:  79 PR Interval:    QRS Duration: 95 QT Interval:  364 QTC Calculation: 418 R Axis:   -11 Text Interpretation: Sinus rhythm Inferior infarct, old Confirmed by Quintella Reichert [phone]) on 07/29/2019 7:09:16 PM   Radiology CT Head Wo Contrast  Result Date: 07/29/2019 CLINICAL DATA:  History of pseudotumor cerebri.  Double vision. EXAM: CT HEAD WITHOUT CONTRAST TECHNIQUE: Contiguous axial images were obtained from the base of the skull through the vertex without intravenous contrast. COMPARISON:  07/04/2019 FINDINGS: Brain: No acute intracranial abnormality. Specifically, no hemorrhage, hydrocephalus, mass lesion, acute infarction, or significant intracranial injury. Vascular: No hyperdense vessel or unexpected calcification. Skull: No acute calvarial abnormality. Sinuses/Orbits: Visualized paranasal sinuses and mastoids clear. Orbital soft tissues unremarkable. Other: None IMPRESSION: No acute intracranial abnormality. Electronically Signed   By: Rolm Baptise M.D.   On: 07/29/2019 19:29    Procedures Procedures (including critical care time)  Medications Ordered in ED Medications  sodium chloride 0.9 % bolus 500 mL (0 mLs Intravenous Stopped 07/29/19 1905)  droperidol (INAPSINE) 2.5 MG/ML injection 2.5 mg (2.5 mg Intravenous Given 07/29/19 1842)    ED Course  I have reviewed the triage vital signs and the nursing notes.  Pertinent labs & imaging results that were available during my care of the patient were reviewed by me and considered in my medical  decision making (see chart for details).    MDM Rules/Calculators/A&P                     Patient here for evaluation of headache, left-sided weakness. She is non-toxic appearing on evaluation. She does have some mild drift of the left arm  and leg but her strength is intact bilaterally. Imaging is negative for acute abnormality. Presentation is not consistent with CVA, meningitis, subarachnoid hemorrhage. Following treatment for her headache she is feeling improved. Plan to discharge home with outpatient neurology follow-up and return precautions.  Final Clinical Impression(s) / ED Diagnoses Final diagnoses:  Complicated migraine    Rx / DC Orders ED Discharge Orders    None       Quintella Reichert, MD 07/29/19 2338

## 2019-07-29 NOTE — ED Notes (Signed)
Taken to CT at this time. 

## 2019-07-31 ENCOUNTER — Encounter: Payer: Self-pay | Admitting: Adult Health

## 2019-07-31 ENCOUNTER — Other Ambulatory Visit: Payer: Self-pay

## 2019-07-31 ENCOUNTER — Ambulatory Visit: Payer: BC Managed Care – PPO | Admitting: Adult Health

## 2019-07-31 VITALS — BP 135/74 | HR 74 | Temp 97.1°F | Ht 67.0 in | Wt 237.0 lb

## 2019-07-31 DIAGNOSIS — G932 Benign intracranial hypertension: Secondary | ICD-10-CM | POA: Diagnosis not present

## 2019-07-31 DIAGNOSIS — G43119 Migraine with aura, intractable, without status migrainosus: Secondary | ICD-10-CM | POA: Diagnosis not present

## 2019-07-31 MED ORDER — AJOVY 225 MG/1.5ML ~~LOC~~ SOAJ: 225.0000 mg | SUBCUTANEOUS | 5 refills | Status: DC

## 2019-07-31 MED ORDER — UBRELVY 50 MG PO TABS: 50.0000 mg | ORAL_TABLET | ORAL | 5 refills | Status: DC | PRN

## 2019-07-31 NOTE — Progress Notes (Addendum)
PATIENT: Shelley Ryan DOB: 03-07-1976  REASON FOR VISIT: follow up HISTORY FROM: patient  HISTORY OF PRESENT ILLNESS: Today 07/31/19:  Shelley Ryan is a 44 year old female with a history of IH.  She returns today for follow-up.  She reports that she got some relief after lumbar puncture.  Reports that her headaches improved and vision was clear.  She states that she is back to having daily headaches.  Some days the headaches are mild today she reports her headache is 3 out of 10 but other days they are severe.  She reports that on occasion she will have double vision.  She went to the emergency room on Sunday after developing strokelike symptoms.  Reported that she had a left facial droop and left-sided weakness.  CT scan in the ED was normal.  She states her headaches typically occur across the front of the head in the occipital region.  She has mild phonophobia on occasion will have nausea but no vomiting.  She states on occasion she also may wake up during the night with a severe headache.  She is on CPAP therapy managed by [facility].  Reports compliancy.  She was taking indomethacin but kidney and liver function has elevated.  Reports that liver function has not normalized but kidney function remains slightly elevated.  She had her IUD taken out.  Headaches appear to be worse in the mornings and at the end of the day.  She is on amitriptyline for gastroparesis.  She recent saw ophthalmology and reports that she had no optic edema.  I have not seen this report.  She returns today for evaluation.  HISTORY 06/19/19: (copied from Dr. Cathren Laine note)   Shelley Ryan is a 44 y.o. female here as requested by [name], MD for headaches.PMHx PCOS, dural sinus thrombosis(2002), She does not have a history of headache per patient. No history of migraines, no history of migraines in the family. She has sleep apnea and is very compliant with cpap and she follows closely with a sleep doctor. This is  day 8 of her headaches, mostly diffuse and like a poker on the left side of the head, base of the skull, nausea and vertigo, tinniitus and a whooshing in her ears.  Headache is worse laying down when flat, worse in the morning and better throughout the day. She has gained 40 pounds in the span of 2-3 months, she has had hormonal testing and has seen her primary care for this, she has an IUD, placed quite some time ago. Also vision changes, she has an ophthalmologist she just went and no papilledema. She has a lot of tightness in the cervical muscles as well that pre-dates the new symptoms will send for PT. She has left sided weakness and hemisensory loss from prior dural sinus thrombosis, worsening, may be recrudescence of symptoms as workup revealed no new issues. No other focal neurologic deficits, associated symptoms, inciting events or modifiable factors.  Reviewed notes, labs and imaging from outside physicians, which showed:  I reviewed MRI images of the brain February 2021 which were significant for partially empty sella which we can sometimes see in idiopathic intracranial hypertension.  I reviewed the reports of the MR angio of the head and neck which did not show any significant large or medium artery occlusions or stenosis, and MRV was normal.  I also reviewed labs.  REVIEW OF SYSTEMS: Out of a complete 14 system review of symptoms, the patient complains only of the  following symptoms, and all other reviewed systems are negative.  See HPI  ALLERGIES: Allergies  Allergen Reactions  . Biaxin [Clarithromycin] Nausea And Vomiting    Acute renal failure  . Cisapride Anaphylaxis and Other (See Comments)    Cannot tolerate it  . Estrogens Other (See Comments)    Other reaction(s): Other Pt. States has a stroke Blood clots   . Cholestyramine Nausea And Vomiting  . Ciprofloxacin Hives and Rash  . Keflex [Cephalexin] Nausea And Vomiting    Tolerates rocephin fine  . Metronidazole  Diarrhea    vomiting   . Minocycline Hcl Nausea And Vomiting and Other (See Comments)    Migraine    . Tetracyclines & Related Nausea And Vomiting and Other (See Comments)    Migraine   . Unasyn [Ampicillin-Sulbactam Sodium] Nausea And Vomiting  . Ampicillin Diarrhea       . Codeine Itching and Rash    Tolerates tramadol fine  . Reglan [Metoclopramide] Other (See Comments)    "doesn't act like her self"  . Tape Rash    Can only use paper tape    HOME MEDICATIONS: Outpatient Medications Prior to Visit  Medication Sig Dispense Refill  . acetaminophen (TYLENOL) 325 MG tablet Take 650 mg by mouth every 6 (six) hours as needed for mild pain.     Marland Kitchen acetaZOLAMIDE (DIAMOX) 250 MG tablet Take 2 tablets (500 mg total) by mouth 2 (two) times daily. 120 tablet 5  . amitriptyline (ELAVIL) 25 MG tablet Take 25 mg by mouth at bedtime.    . famotidine (PEPCID) 40 MG tablet Take 40 mg by mouth 2 (two) times daily.    Marland Kitchen losartan (COZAAR) 50 MG tablet Take 50 mg by mouth daily.    . metFORMIN (GLUCOPHAGE) 500 MG tablet Take 500 mg by mouth 2 (two) times a day.    . Multiple Vitamins-Minerals (MULTIVITAMIN WITH MINERALS) tablet Take 1 tablet by mouth daily.    . ondansetron (ZOFRAN-ODT) 4 MG disintegrating tablet Take 4 mg by mouth every 4 (four) hours as needed for nausea or vomiting.    . prochlorperazine (COMPAZINE) 10 MG tablet Take 10 mg by mouth every 6 (six) hours as needed for nausea or vomiting.     . Vitamin D, Ergocalciferol, (DRISDOL) 1.25 MG (50000 UNIT) CAPS capsule Take 1 capsule (50,000 Units total) by mouth every 7 (seven) days. 4 capsule 0  . Cholecalciferol (VITAMIN D-1000 MAX ST) 25 MCG (1000 UT) tablet Take 6,000 Units by mouth daily.     . cimetidine (TAGAMET) 400 MG tablet Take 400 mg by mouth 2 (two) times a day.    . Levonorgestrel 19.5 MG IUD by Intrauterine route.    . nizatidine (AXID) 150 MG capsule Take 150 mg by mouth 2 (two) times daily.     No facility-administered  medications prior to visit.    PAST MEDICAL HISTORY: Past Medical History:  Diagnosis Date  . Anemia   . Bilateral swelling of feet   . BRCA1 negative 10/08/2013  . CVA (cerebral infarction) 2002   Most likely from Hormonal Tx  . 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 Gallbladder problem   . Gastric outlet obstruction   . Gastroparesis   . H/O blood clots   . Heartburn   . Hiatal hernia   . Hyperlipidemia   . Hypertension   . IIH (idiopathic intracranial hypertension)   . Inflammatory arthritis   .  Joint pain   . Low vitamin D level    2013  17  . OSA (obstructive sleep apnea)   . PCOS (polycystic ovarian syndrome)    stroke on OCP hormonal therapy  . Prediabetes   . Pylorospasm   . Stroke (cerebrum) ([place])   . Vitamin D deficiency     PAST SURGICAL HISTORY: Past Surgical History:  Procedure Laterality Date  . APPENDECTOMY    . CHOLECYSTECTOMY N/A 10/09/2013   Procedure: LAPAROSCOPIC CHOLECYSTECTOMY WITH INTRAOPERATIVE CHOLANGIOGRAM;  Surgeon: Pedro Earls, MD;  Location: [facility];  Service: General;  Laterality: N/A;  . COLON SURGERY    . KNEE ARTHROSCOPY Bilateral   . PYLOROPLASTY    . RIGHT OOPHORECTOMY    . SALPINGECTOMY Bilateral   . TYMPANOSTOMY TUBE PLACEMENT     x 2  . WISDOM TOOTH EXTRACTION      FAMILY HISTORY: Family History  Problem Relation Age of Onset  . Obesity Mother   . Hyperlipidemia Father   . Breast cancer Other        Great Grandmother   . Breast cancer Other   . Headache Neg Hx   . Migraines Neg Hx     SOCIAL HISTORY: Social History   Socioeconomic History  . Marital status: Married    Spouse name: Not on file  . Number of children: Not on file  . Years of education: Not on file  . Highest education level: Not on file  Occupational History  . Not on file  Tobacco Use  . Smoking status: Never Smoker  . Smokeless tobacco: Never Used  Substance and Sexual Activity  .  Alcohol use: No  . Drug use: No  . Sexual activity: Never    Birth control/protection: None  Other Topics Concern  . Not on file  Social History Narrative   Lives with husband    Right handed   Caffeine: 1 cup once a week   Social Determinants of Health   Financial Resource Strain:   . Difficulty of Paying Living Expenses:   Food Insecurity:   . Worried About Charity fundraiser in the Last Year:   . Arboriculturist in the Last Year:   Transportation Needs:   . Film/video editor (Medical):   Marland Kitchen Lack of Transportation (Non-Medical):   Physical Activity:   . Days of Exercise per Week:   . Minutes of Exercise per Session:   Stress:   . Feeling of Stress :   Social Connections:   . Frequency of Communication with Friends and Family:   . Frequency of Social Gatherings with Friends and Family:   . Attends Religious Services:   . Active Member of Clubs or Organizations:   . Attends Archivist Meetings:   Marland Kitchen Marital Status:   Intimate Partner Violence:   . Fear of Current or Ex-Partner:   . Emotionally Abused:   Marland Kitchen Physically Abused:   . Sexually Abused:       PHYSICAL EXAM  Vitals:   07/31/19 0914  BP: 135/74  Pulse: 74  Temp: (!) 97.1 F (36.2 C)  Weight: 237 lb (107.5 kg)  Height: '5\' 7"'$  (1.702 m)   Body mass index is 37.12 kg/m.  Generalized: Well developed, in no acute distress   Neurological examination  Mentation: Alert oriented to time, place, history taking. Follows all commands speech and language fluent Cranial nerve II-XII: Pupils were equal round reactive to light. Extraocular movements were full, visual  field were full on confrontational test. Facial sensation and strength were normal. Uvula tongue midline. Head turning and shoulder shrug  were normal and symmetric. Motor: The motor testing reveals 5 over 5 strength of all 4 extremities. Good symmetric motor tone is noted throughout.  Sensory: Sensory testing is intact to soft touch on  all 4 extremities. No evidence of extinction is noted.  Coordination: Cerebellar testing reveals good finger-nose-finger and heel-to-shin bilaterally.  Gait and station: Gait is normal. Tandem gait is normal. Romberg is negative. No drift is seen.  Reflexes: Deep tendon reflexes are symmetric and normal bilaterally.   DIAGNOSTIC DATA (LABS, IMAGING, TESTING) - I reviewed patient records, labs, notes, testing and imaging myself where available.  Lab Results  Component Value Date   WBC 9.3 07/29/2019   HGB 14.1 07/29/2019   HCT 43.3 07/29/2019   MCV 91.4 07/29/2019   PLT 209 07/29/2019      Component Value Date/Time   NA 138 07/29/2019 1825   NA 141 07/02/2019 1158   K 3.6 07/29/2019 1825   CL 109 07/29/2019 1825   CO2 20 (L) 07/29/2019 1825   GLUCOSE 103 (H) 07/29/2019 1825   BUN 21 (H) 07/29/2019 1825   BUN 16 07/02/2019 1158   CREATININE 1.10 (H) 07/29/2019 1825   CALCIUM 9.1 07/29/2019 1825   PROT 7.1 07/29/2019 1825   PROT 7.3 07/02/2019 1158   ALBUMIN 4.0 07/29/2019 1825   ALBUMIN 4.5 07/02/2019 1158   AST 24 07/29/2019 1825   ALT 32 07/29/2019 1825   ALKPHOS 58 07/29/2019 1825   BILITOT 0.4 07/29/2019 1825   BILITOT 0.3 07/02/2019 1158   GFRNONAA >60 07/29/2019 1825   GFRAA >60 07/29/2019 1825   Lab Results  Component Value Date   CHOL 201 (H) 07/02/2019   HDL 48 07/02/2019   LDLCALC 135 (H) 07/02/2019   TRIG 102 07/02/2019   Lab Results  Component Value Date   HGBA1C 6.1 (H) 06/19/2019   Lab Results  Component Value Date   VITAMINB12 515 07/02/2019   Lab Results  Component Value Date   TSH 3.060 07/02/2019      ASSESSMENT AND PLAN 44 y.o. year old female  has a past medical history of Anemia, Bilateral swelling of feet, BRCA1 negative (10/08/2013), CVA (cerebral infarction) (2002), Dural sinus thrombosis (2002), Gallbladder problem, Gastric outlet obstruction, Gastroparesis, H/O blood clots, Heartburn, Hiatal hernia, Hyperlipidemia, Hypertension,  IIH (idiopathic intracranial hypertension), Inflammatory arthritis, Joint pain, Low vitamin D level, OSA (obstructive sleep apnea), PCOS (polycystic ovarian syndrome), Prediabetes, Pylorospasm, Stroke (cerebrum) ([facility]), and Vitamin D deficiency. here with:  1.  IIH  -Continue Diamox 500 mg twice a day  2.  Migraine headaches  -Start Ajovy Roselyn Meier 50 mg ordered as an abortive therapy.  Advised patient to take at the onset of a migraine and can repeat in 2 hours if needed. -Encouraged patient to keep a headache journal.   Advised patient that if her symptoms worsen or she develops new symptoms she should let us know.  She requested to keep her follow-up appointment in 3 weeks with Dr. Lavell Anchors  I spent 30 minutes of face-to-face and non-face-to-face time with patient.  This included previsit chart review, lab review, study review, order entry, electronic health record documentation, patient education.  Ward Givens, MSN, NP-C 07/31/2019, 9:41 AM [facility] [address], [place], [place] [address] [phone]  Made any corrections needed, and agree with history, physical, neuro exam,assessment and plan as stated.  Sarina Ill, MD [facility]

## 2019-07-31 NOTE — Patient Instructions (Signed)
Your Plan:  Continue Diamox Start Ashby Dawes take at the onset of a migraine can repeat in 2 hours if needed. Not to exceed 2 tabs/24 hours Keep Headache journal  Thank you for coming to see [place] at [facility]. I hope we have been able to provide you high quality care today.  You may receive a patient satisfaction survey over the next few weeks. We would appreciate your feedback and comments so that we may continue to improve ourselves and the health of our patients.

## 2019-08-07 ENCOUNTER — Encounter (INDEPENDENT_AMBULATORY_CARE_PROVIDER_SITE_OTHER): Payer: Self-pay | Admitting: Family Medicine

## 2019-08-07 ENCOUNTER — Ambulatory Visit: Payer: BC Managed Care – PPO | Admitting: Neurology

## 2019-08-07 ENCOUNTER — Other Ambulatory Visit: Payer: Self-pay

## 2019-08-07 ENCOUNTER — Other Ambulatory Visit (INDEPENDENT_AMBULATORY_CARE_PROVIDER_SITE_OTHER): Payer: Self-pay | Admitting: Family Medicine

## 2019-08-07 ENCOUNTER — Ambulatory Visit (INDEPENDENT_AMBULATORY_CARE_PROVIDER_SITE_OTHER): Payer: BC Managed Care – PPO | Admitting: Family Medicine

## 2019-08-07 VITALS — BP 109/77 | HR 95 | Temp 97.9°F | Ht 67.0 in | Wt 232.0 lb

## 2019-08-07 DIAGNOSIS — Z9189 Other specified personal risk factors, not elsewhere classified: Secondary | ICD-10-CM | POA: Diagnosis not present

## 2019-08-07 DIAGNOSIS — I10 Essential (primary) hypertension: Secondary | ICD-10-CM

## 2019-08-07 DIAGNOSIS — E559 Vitamin D deficiency, unspecified: Secondary | ICD-10-CM

## 2019-08-07 DIAGNOSIS — E7849 Other hyperlipidemia: Secondary | ICD-10-CM | POA: Diagnosis not present

## 2019-08-07 DIAGNOSIS — Z6836 Body mass index (BMI) 36.0-36.9, adult: Secondary | ICD-10-CM

## 2019-08-07 MED ORDER — LOSARTAN POTASSIUM 50 MG PO TABS: 25.0000 mg | ORAL_TABLET | Freq: Every day | ORAL | 0 refills | Status: DC

## 2019-08-07 MED ORDER — VITAMIN D (ERGOCALCIFEROL) 1.25 MG (50000 UNIT) PO CAPS: 50000.0000 [IU] | ORAL_CAPSULE | ORAL | 0 refills | Status: DC

## 2019-08-07 NOTE — Progress Notes (Signed)
Chief Complaint:   OBESITY Shelley Ryan is here to discuss her progress with her obesity treatment plan along with follow-up of her obesity related diagnoses. Shelley Ryan is on the Category 2 Plan and states she is following her eating plan approximately 98% of the time. Shelley Ryan states she is doing 0 minutes 0 times per week.  Today's visit was #: 3 Starting weight: 242 lbs Starting date: 07/02/2019 Today's weight: 232 lbs Today's date: 08/07/2019 Total lbs lost to date: 10 Total lbs lost since last in-office visit: 6  Interim History: Shelley Ryan reports having significant migraines and needing rescue medicine last night. For 1 day, she voices she didn't eat well because she ended up in the emergency department. She is trying to eat consistently. She denies hunger, but notes cravings for cake. She has tried mug cake and low calorie options of brownie. She is making sure she is getting in at least 6 oz of equivalent of protein for dinner.  Subjective:   1. Vitamin D deficiency Shelley Ryan denies nausea, vomiting, or muscle weakness, but she notes fatigue. Last Vit D level was of 22.0.  2. Other hyperlipidemia Shelley Ryan's LDL was 135, HDL 48, and triglycerides 102. She is not on statin.  3. Essential hypertension Shelley Ryan's blood pressure is controlled today. She reports her blood pressure was of 90's/60's at home.  4. At risk for osteoporosis Shelley Ryan is at higher risk of osteopenia and osteoporosis due to Vitamin D deficiency.   Assessment/Plan:   1. Vitamin D deficiency Low Vitamin D level contributes to fatigue and are associated with obesity, breast, and colon cancer. We will refill prescription Vitamin D for 1 month. Shelley Ryan will follow-up for routine testing of Vitamin D, at least 2-3 times per year to avoid over-replacement.  - Vitamin D, Ergocalciferol, (DRISDOL) 1.25 MG (50000 UNIT) CAPS capsule; Take 1 capsule (50,000 Units total) by mouth every 7 (seven) days.  Dispense: 4 capsule; Refill: 0  2. Other  hyperlipidemia Cardiovascular risk and specific lipid/LDL goals reviewed. We discussed several lifestyle modifications today and Shelley Ryan will continue to work on diet, exercise and weight loss efforts. We will repeat labs in 2 months. Orders and follow up as documented in patient record.   Counseling Intensive lifestyle modifications are the first line treatment for this issue. . Dietary changes: Increase soluble fiber. Decrease simple carbohydrates. . Exercise changes: Moderate to vigorous-intensity aerobic activity 150 minutes per week if tolerated. . Lipid-lowering medications: see documented in medical record.  3. Essential hypertension [name] is working on healthy weight loss and exercise to improve blood pressure control. Shelley Ryan agreed to decrease losartan to 25 mg daily (she is to cut pill in half). We will watch for signs of hypotension as she continues her lifestyle modifications.  4. At risk for osteoporosis Shelley Ryan was given approximately 15 minutes of osteoporosis prevention counseling today. Shelley Ryan is at risk for osteopenia and osteoporosis due to her Vitamin D deficiency. She was encouraged to take her Vitamin D and follow her higher calcium diet and increase strengthening exercise to help strengthen her bones and decrease her risk of osteopenia and osteoporosis.  Repetitive spaced learning was employed today to elicit superior memory formation and behavioral change.  5. Class 2 severe obesity with serious comorbidity and body mass index (BMI) of 36.0 to 36.9 in adult, unspecified obesity type (HCC) Shelley Ryan is currently in the action stage of change. As such, her goal is to continue with weight loss efforts. She has agreed to the Category 2  Plan.   Exercise goals: As is.  Behavioral modification strategies: increasing lean protein intake, increasing vegetables, meal planning and cooking strategies, keeping healthy foods in the home and planning for success.  Shelley Ryan has agreed to follow-up  with our clinic in 2 weeks. She was informed of the importance of frequent follow-up visits to maximize her success with intensive lifestyle modifications for her multiple health conditions.   Objective:   Blood pressure 109/77, pulse 95, temperature 97.9 F (36.6 C), temperature source Oral, height 5\' 7"  (1.702 m), weight 232 lb (105.2 kg), last menstrual period 07/29/2019, SpO2 98 %. Body mass index is 36.34 kg/m.  General: Cooperative, alert, well developed, in no acute distress. HEENT: Conjunctivae and lids unremarkable. Cardiovascular: Regular rhythm.  Lungs: Normal work of breathing. Neurologic: No focal deficits.   Lab Results  Component Value Date   CREATININE 1.10 (H) 07/29/2019   BUN 21 (H) 07/29/2019   NA 138 07/29/2019   K 3.6 07/29/2019   CL 109 07/29/2019   CO2 20 (L) 07/29/2019   Lab Results  Component Value Date   ALT 32 07/29/2019   AST 24 07/29/2019   ALKPHOS 58 07/29/2019   BILITOT 0.4 07/29/2019   Lab Results  Component Value Date   HGBA1C 6.1 (H) 06/19/2019   Lab Results  Component Value Date   INSULIN 13.7 07/02/2019   Lab Results  Component Value Date   TSH 3.060 07/02/2019   Lab Results  Component Value Date   CHOL 201 (H) 07/02/2019   HDL 48 07/02/2019   LDLCALC 135 (H) 07/02/2019   TRIG 102 07/02/2019   Lab Results  Component Value Date   WBC 9.3 07/29/2019   HGB 14.1 07/29/2019   HCT 43.3 07/29/2019   MCV 91.4 07/29/2019   PLT 209 07/29/2019   No results found for: IRON, TIBC, FERRITIN  Attestation Statements:   Reviewed by clinician on day of visit: allergies, medications, problem list, medical history, surgical history, family history, social history, and previous encounter notes.   I, 09/28/2019, am acting as transcriptionist for Burt Knack, MD.  I have reviewed the above documentation for accuracy and completeness, and I agree with the above. - Margarette Asal, MD

## 2019-08-08 ENCOUNTER — Emergency Department (HOSPITAL_COMMUNITY): Payer: BC Managed Care – PPO

## 2019-08-08 ENCOUNTER — Other Ambulatory Visit: Payer: Self-pay

## 2019-08-08 ENCOUNTER — Emergency Department (HOSPITAL_COMMUNITY)
Admission: EM | Admit: 2019-08-08 | Discharge: 2019-08-08 | Disposition: A | Discharge status: Home / Self Care | Payer: BC Managed Care – PPO | Attending: Emergency Medicine | Admitting: Emergency Medicine

## 2019-08-08 DIAGNOSIS — R531 Weakness: Secondary | ICD-10-CM | POA: Insufficient documentation

## 2019-08-08 DIAGNOSIS — G43909 Migraine, unspecified, not intractable, without status migrainosus: Secondary | ICD-10-CM | POA: Insufficient documentation

## 2019-08-08 DIAGNOSIS — R4701 Aphasia: Secondary | ICD-10-CM | POA: Insufficient documentation

## 2019-08-08 DIAGNOSIS — Z8673 Personal history of transient ischemic attack (TIA), and cerebral infarction without residual deficits: Secondary | ICD-10-CM | POA: Insufficient documentation

## 2019-08-08 DIAGNOSIS — I10 Essential (primary) hypertension: Secondary | ICD-10-CM | POA: Diagnosis not present

## 2019-08-08 DIAGNOSIS — Z86011 Personal history of benign neoplasm of the brain: Secondary | ICD-10-CM | POA: Diagnosis not present

## 2019-08-08 DIAGNOSIS — Z79899 Other long term (current) drug therapy: Secondary | ICD-10-CM | POA: Diagnosis not present

## 2019-08-08 DIAGNOSIS — Z7984 Long term (current) use of oral hypoglycemic drugs: Secondary | ICD-10-CM | POA: Insufficient documentation

## 2019-08-08 DIAGNOSIS — R29818 Other symptoms and signs involving the nervous system: Secondary | ICD-10-CM

## 2019-08-08 DIAGNOSIS — R519 Headache, unspecified: Secondary | ICD-10-CM | POA: Diagnosis present

## 2019-08-08 LAB — DIFFERENTIAL
Abs Immature Granulocytes: 0.04 10*3/uL (ref 0.00–0.07)
Basophils Absolute: 0.1 10*3/uL (ref 0.0–0.1)
Basophils Relative: 1 %
Eosinophils Absolute: 0.1 10*3/uL (ref 0.0–0.5)
Eosinophils Relative: 1 %
Immature Granulocytes: 0 %
Lymphocytes Relative: 20 %
Lymphs Abs: 2.6 10*3/uL (ref 0.7–4.0)
Monocytes Absolute: 0.8 10*3/uL (ref 0.1–1.0)
Monocytes Relative: 6 %
Neutro Abs: 9 10*3/uL — ABNORMAL HIGH (ref 1.7–7.7)
Neutrophils Relative %: 72 %

## 2019-08-08 LAB — COMPREHENSIVE METABOLIC PANEL
ALT: 33 U/L (ref 0–44)
AST: 20 U/L (ref 15–41)
Albumin: 4.2 g/dL (ref 3.5–5.0)
Alkaline Phosphatase: 67 U/L (ref 38–126)
Anion gap: 13 (ref 5–15)
BUN: 16 mg/dL (ref 6–20)
CO2: 17 mmol/L — ABNORMAL LOW (ref 22–32)
Calcium: 9.5 mg/dL (ref 8.9–10.3)
Chloride: 109 mmol/L (ref 98–111)
Creatinine, Ser: 0.96 mg/dL (ref 0.44–1.00)
GFR calc Af Amer: >60 mL/min (ref >60)
GFR calc non Af Amer: >60 mL/min (ref >60)
Glucose, Bld: 103 mg/dL — ABNORMAL HIGH (ref 70–99)
Potassium: 3.5 mmol/L (ref 3.5–5.1)
Sodium: 139 mmol/L (ref 135–145)
Total Bilirubin: 0.7 mg/dL (ref 0.3–1.2)
Total Protein: 7.5 g/dL (ref 6.5–8.1)

## 2019-08-08 LAB — CBC
HCT: 44.4 % (ref 36.0–46.0)
Hemoglobin: 14.5 g/dL (ref 12.0–15.0)
MCH: 30.1 pg (ref 26.0–34.0)
MCHC: 32.7 g/dL (ref 30.0–36.0)
MCV: 92.3 fL (ref 80.0–100.0)
Platelets: 255 10*3/uL (ref 150–400)
RBC: 4.81 MIL/uL (ref 3.87–5.11)
RDW: 13.9 % (ref 11.5–15.5)
WBC: 12.6 10*3/uL — ABNORMAL HIGH (ref 4.0–10.5)
nRBC: 0 % (ref 0.0–0.2)

## 2019-08-08 LAB — I-STAT CHEM 8, ED
BUN: 18 mg/dL (ref 6–20)
Calcium, Ion: 1.17 mmol/L (ref 1.15–1.40)
Chloride: 110 mmol/L (ref 98–111)
Creatinine, Ser: 0.9 mg/dL (ref 0.44–1.00)
Glucose, Bld: 95 mg/dL (ref 70–99)
HCT: 43 % (ref 36.0–46.0)
Hemoglobin: 14.6 g/dL (ref 12.0–15.0)
Potassium: 3.6 mmol/L (ref 3.5–5.1)
Sodium: 140 mmol/L (ref 135–145)
TCO2: 19 mmol/L — ABNORMAL LOW (ref 22–32)

## 2019-08-08 LAB — PROTIME-INR
INR: 1 (ref 0.8–1.2)
Prothrombin Time: 13.4 seconds (ref 11.4–15.2)

## 2019-08-08 LAB — I-STAT BETA HCG BLOOD, ED (MC, WL, AP ONLY): I-stat hCG, quantitative: <5 m[IU]/mL (ref <5)

## 2019-08-08 LAB — CBG MONITORING, ED: Glucose-Capillary: 92 mg/dL (ref 70–99)

## 2019-08-08 LAB — APTT: aPTT: 30 seconds (ref 24–36)

## 2019-08-08 MED ORDER — SODIUM CHLORIDE 0.9% FLUSH
3.0000 mL | Freq: Once | INTRAVENOUS | Status: DC
Start: 2019-08-08 — End: 2019-08-09

## 2019-08-08 MED ORDER — PROCHLORPERAZINE EDISYLATE 10 MG/2ML IJ SOLN: 10.0000 mg | Freq: Once | INTRAMUSCULAR | Status: DC

## 2019-08-08 NOTE — Code Documentation (Signed)
Stroke Response Nurse Documentation Code Documentation  Shelley Ryan is a 44 y.o. female arriving to [facility] ED via Private Vehicle on 08/08/19 with past medical hx of pseudo tumor with migraines (dx 2 months ago- sees Dr. Lucia Gaskins) and sleep apnea (CPAP at night).   Code stroke was activated by ED. Patient from home where she was LKW at 1600 and now complaining of increased word finding, left sided weakness and numbness. Stroke team meet patient outside CT. Labs drawn and CBG 92.   NIHSS 1, see documentation for details and code stroke times. Patient with left arm weakness and left leg weakness on exam, no drift noted in left leg but weaker than right leg. The following imaging was completed: CT. Patient is not a candidate for tPA due to stroke not suspected. Care/Plan Q2 hour VS and mNIHSS. Bedside handoff with Selena Batten, RN.    Ferman Hamming Stroke Response RN

## 2019-08-08 NOTE — Consult Note (Signed)
Neurology Consultation Reason for Consult: Left-sided weakness Referring Physician: Hillard Danker  CC: Left-sided weakness  History is obtained from: Patient  HPI: Shelley Ryan is a 44 y.o. female known to me from her recent ED evaluation in February who has a history of venous sinus thrombosis with stroke in 2003.  The time of her evaluation February, she had headache and I was concerned for possible complicated migraine, though I did note that she had some findings on exam concerning for functional deficits.   Today she was in her normal state of health until about 4 PM.  At that time she had approximately 1 minute of difficulty speaking, stating that what was coming out was just garbled.  About 10 minutes later she began noticing weakness of her left side and then subsequently had a mild left frontal headache.   LKW: 4 PM tpa given?: no, mild symptoms   ROS: A 14 point ROS was performed and is negative except as noted in the HPI.   Past Medical History:  Diagnosis Date  . Anemia   . Bilateral swelling of feet   . BRCA1 negative 10/08/2013  . CVA (cerebral infarction) 2002   Most likely from Hormonal Tx  . 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 Gallbladder problem   . Gastric outlet obstruction   . Gastroparesis   . H/O blood clots   . Heartburn   . Hiatal hernia   . Hyperlipidemia   . Hypertension   . IIH (idiopathic intracranial hypertension)   . Inflammatory arthritis   . Joint pain   . Low vitamin D level    2013  17  . OSA (obstructive sleep apnea)   . PCOS (polycystic ovarian syndrome)    stroke on OCP hormonal therapy  . Prediabetes   . Pylorospasm   . Stroke (cerebrum) ([place])   . Vitamin D deficiency      Family History  Problem Relation Age of Onset  . Obesity Mother   . Hyperlipidemia Father   . Breast cancer Other        Great Grandmother   . Breast cancer Other   . Headache Neg Hx   . Migraines  Neg Hx      Social History:  reports that she has never smoked. She has never used smokeless tobacco. She reports that she does not drink alcohol or use drugs.   Exam: Current vital signs: BP 114/75   Pulse 89   Temp 97.9 F (36.6 C) (Oral)   Resp 14   Ht '5\' 7"'  (1.702 m)   Wt 108.4 kg   LMP 07/29/2019   SpO2 100%   BMI 37.43 kg/m  Vital signs in last 24 hours: Temp:  [97.9 F (36.6 C)] 97.9 F (36.6 C) (04/14 1741) Pulse Rate:  [85-89] 89 (04/14 2000) Resp:  [14-18] 14 (04/14 2000) BP: (106-125)/(68-88) 114/75 (04/14 2000) SpO2:  [98 %-100 %] 100 % (04/14 2000) Weight:  [108.4 kg] 108.4 kg (04/14 1816)   Physical Exam  Constitutional: Appears well-developed and well-nourished.  Psych: Affect appropriate to situation Eyes: No scleral injection HENT: No OP obstrucion MSK: no joint deformities.  Cardiovascular: Normal rate and regular rhythm.  Respiratory: Effort normal, non-labored breathing GI: Soft.  No distension. There is no tenderness.  Skin: WDI  Neuro: Mental Status: Patient is awake, alert, oriented to person, place, month, year, and situation. Patient is able to give a clear and  coherent history. No signs of aphasia or neglect Cranial Nerves: II: Visual Fields are full. Pupils are equal, round, and reactive to light.   III,IV, VI: EOMI without ptosis or diploplia.  V: Facial sensation is symmetric to temperature VII: Facial movement is symmetric.  VIII: hearing is intact to voice X: Uvula elevates symmetrically XI: Shoulder shrug is symmetric. XII: tongue is midline without atrophy or fasciculations.  Motor: Tone is normal. Bulk is normal. 5/5 strength was present in right arm and bilateral legs.  She has mild left arm weakness which appears slightly inconsistent comparing formal strength testing versus cerebellar testing.  She has downward drift without pronation. Sensory: Sensation is symmetric to light touch and temperature in the arms and  legs. Cerebellar: FNF  intact bilaterally   I have reviewed labs in epic and the results pertinent to this consultation are: CMP is relatively unremarkable  I have reviewed the images obtained: CT head-unremarkable  Impression: 44 year old female with a history of venous sinus thrombosis who presents with mild left arm weakness.  I discussed that possibilities included stress-related symptoms, complicated migraine, or less likely stroke.  I discussed that even if this was stroke, I would not recommend thrombolytics at this time given that her symptoms are relatively mild.  Given her history, I do think an MRI would be reasonable, but if this is negative I would not pursue further work-up at this time.  Recommendations: 1) MRI brain, further work-up only if positive for stroke.   Roland Rack, MD [facility] [phone]  If 7pm- 7am, please page neurology on call as listed in [place].

## 2019-08-08 NOTE — ED Triage Notes (Signed)
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 Significant weakness noted to right arm. No facial droop or slurred speech noted.

## 2019-08-08 NOTE — ED Provider Notes (Signed)
[facility] EMERGENCY DEPARTMENT Provider Note   CSN: [id number] Arrival date & time: 08/08/19  1719     History CC: Weakness, aphasia  Shelley Ryan is a 44 y.o. female.  HPI   Pt started having difficulty at 4:15.  She had trouble with her speech.  She also had issues with her left arm and leg feeling weak.  SHe also complained of a headache.  Patient's headache is persistent at a 4-10 but not too severe.  Her speech issues have resolved but she does continue to feel some weakness in her left arm and left leg .  patient came in as a code stroke.  She was evaluated by Dr. Leonel Ramsay in the stroke team initially on arrival.  Past Medical History:  Diagnosis Date  . Anemia   . Bilateral swelling of feet   . BRCA1 negative 10/08/2013  . CVA (cerebral infarction) 2002   Most likely from Hormonal Tx  . 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 Gallbladder problem   . Gastric outlet obstruction   . Gastroparesis   . H/O blood clots   . Heartburn   . Hiatal hernia   . Hyperlipidemia   . Hypertension   . IIH (idiopathic intracranial hypertension)   . Inflammatory arthritis   . Joint pain   . Low vitamin D level    2013  17  . OSA (obstructive sleep apnea)   . PCOS (polycystic ovarian syndrome)    stroke on OCP hormonal therapy  . Prediabetes   . Pylorospasm   . Stroke (cerebrum) ([place])   . Vitamin D deficiency     Patient Active Problem List   Diagnosis Date Noted  . Esophageal hiatal hernia 06/20/2019  . Stroke ([place]) 06/20/2019  . IIH (idiopathic intracranial hypertension) 06/20/2019  . 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 Myopia with astigmatism, bilateral 01/05/2019  . Posterior vitreous detachment of both eyes 01/05/2019  . Blepharitis of both upper and lower eyelid 01/05/2019  . Family history of malignant hyperthermia 05/17/2016   . GERD (gastroesophageal reflux disease) 02/25/2016  . Chronic cholecystitis with calculus 10/30/2013  . Acute cholecystitis 10/10/2013  . Status post laparoscopic cholecystectomy June 2015 10/09/2013  . Obesity (BMI 30-39.9) 10/08/2013  . PCOS (polycystic ovarian syndrome)     Past Surgical History:  Procedure Laterality Date  . APPENDECTOMY    . CHOLECYSTECTOMY N/A 10/09/2013   Procedure: LAPAROSCOPIC CHOLECYSTECTOMY WITH INTRAOPERATIVE CHOLANGIOGRAM;  Surgeon: Pedro Earls, MD;  Location: [facility]S;  Service: General;  Laterality: N/A;  . COLON SURGERY    . KNEE ARTHROSCOPY Bilateral   . PYLOROPLASTY    . RIGHT OOPHORECTOMY    . SALPINGECTOMY Bilateral   . TYMPANOSTOMY TUBE PLACEMENT     x 2  . WISDOM TOOTH EXTRACTION       OB History    Gravida  1   Para      Term      Preterm      AB      Living        SAB      TAB      Ectopic      Multiple      Live Births              Family History  Problem Relation Age of Onset  .  Obesity Mother   . Hyperlipidemia Father   . Breast cancer Other        Great Grandmother   . Breast cancer Other   . Headache Neg Hx   . Migraines Neg Hx     Social History   Tobacco Use  . Smoking status: Never Smoker  . Smokeless tobacco: Never Used  Substance Use Topics  . Alcohol use: No  . Drug use: No    Home Medications Prior to Admission medications   Medication Sig Start Date End Date Taking? Authorizing Provider  acetaminophen (TYLENOL) 325 MG tablet Take 650 mg by mouth every 6 (six) hours as needed for mild pain.    Yes [provider]  acetaZOLAMIDE (DIAMOX) 250 MG tablet Take 2 tablets (500 mg total) by mouth 2 (two) times daily. 07/04/19  Yes Melvenia Beam, MD  amitriptyline (ELAVIL) 25 MG tablet Take 25 mg by mouth at bedtime. 08/09/18  Yes [provider]  famotidine (PEPCID) 40 MG tablet Take 40 mg by mouth 2 (two) times daily.   Yes [provider]  Fremanezumab-vfrm  (AJOVY) 225 MG/1.5ML SOAJ Inject 225 mg into the skin every 30 (thirty) days. 07/31/19  Yes Ward Givens, NP  losartan (COZAAR) 50 MG tablet Take 0.5 tablets (25 mg total) by mouth daily. 08/07/19  Yes Eber Jones, MD  metFORMIN (GLUCOPHAGE) 500 MG tablet Take 500 mg by mouth 2 (two) times a day. 07/10/18  Yes [provider]  Multiple Vitamins-Minerals (MULTIVITAMIN WITH MINERALS) tablet Take 1 tablet by mouth daily.   Yes [provider]  ondansetron (ZOFRAN-ODT) 4 MG disintegrating tablet Take 4 mg by mouth every 4 (four) hours as needed for nausea or vomiting (DISSOLVE ORALLY).    Yes [provider]  prochlorperazine (COMPAZINE) 10 MG tablet Take 10 mg by mouth every 6 (six) hours as needed for nausea or vomiting.  12/22/15  Yes [provider]  Ubrogepant (UBRELVY) 50 MG TABS Take 50 mg by mouth as needed. At the onset of migraine- can repeat in 2 hours if needed Patient taking differently: Take 50 mg by mouth See admin instructions. Take 50 mg by mouth at onset of a migraine and may repeat once in 2 hours, if no relief- seek medical attention if still not resolution after 2nd dose 07/31/19  Yes [place], Jinny Blossom, NP  Vitamin D, Ergocalciferol, (DRISDOL) 1.25 MG (50000 UNIT) CAPS capsule Take 1 capsule (50,000 Units total) by mouth every 7 (seven) days. Patient taking differently: Take 50,000 Units by mouth every Monday.  08/07/19  Yes Eber Jones, MD    Allergies    Cisapride, Clarithromycin, Estrogens, Tape, Cholestyramine, Ciprofloxacin, Keflex [cephalexin], Metronidazole, Minocycline hcl, Tetracyclines & related, Unasyn [ampicillin-sulbactam sodium], Ampicillin, Codeine, and Metoclopramide  Review of Systems   Review of Systems  All other systems reviewed and are negative.   Physical Exam Updated Vital Signs BP 114/75   Pulse 89   Temp 97.9 F (36.6 C) (Oral)   Resp 14   Ht 1.702 m (_0 )   Wt 108.4 kg   LMP 07/29/2019   SpO2  100%   BMI 37.43 kg/m   Physical Exam Vitals and nursing note reviewed.  Constitutional:      General: She is not in acute distress.    Appearance: She is well-developed.  HENT:     Head: Normocephalic and atraumatic.     Right Ear: External ear normal.     Left Ear: External ear normal.  Eyes:     General: No scleral icterus.       Right eye: No discharge.        Left eye: No discharge.     Conjunctiva/sclera: Conjunctivae normal.  Neck:     Trachea: No tracheal deviation.  Cardiovascular:     Rate and Rhythm: Normal rate and regular rhythm.  Pulmonary:     Effort: Pulmonary effort is normal. No respiratory distress.     Breath sounds: Normal breath sounds. No stridor. No wheezing or rales.  Abdominal:     General: Bowel sounds are normal. There is no distension.     Palpations: Abdomen is soft.     Tenderness: There is no abdominal tenderness. There is no guarding or rebound.  Musculoskeletal:        General: No tenderness.     Cervical back: Neck supple.  Skin:    General: Skin is warm and dry.     Findings: No rash.  Neurological:     Mental Status: She is alert and oriented to person, place, and time.     Cranial Nerves: No cranial nerve deficit (No facial droop, extraocular movements intact, tongue midline ).     Sensory: No sensory deficit.     Motor: No abnormal muscle tone or seizure activity.     Coordination: Coordination normal.     Comments: Slight weakness left grip strength and left plantar flexion compared to right, sensation intact in all extremities, no visual field cuts, no left or right sided neglect, normal finger-nose exam bilaterally, no nystagmus noted      ED Results / Procedures / Treatments   Labs (all labs ordered are listed, but only abnormal results are displayed) Labs Reviewed  CBC - Abnormal; Notable for the following components:      Result Value   WBC 12.6 (*)    All other components within normal limits  DIFFERENTIAL - Abnormal;  Notable for the following components:   Neutro Abs 9.0 (*)    All other components within normal limits  COMPREHENSIVE METABOLIC PANEL - Abnormal; Notable for the following components:   CO2 17 (*)    Glucose, Bld 103 (*)    All other components within normal limits  I-STAT CHEM 8, ED - Abnormal; Notable for the following components:   TCO2 19 (*)    All other components within normal limits  PROTIME-INR  APTT  CBG MONITORING, ED  I-STAT BETA HCG BLOOD, ED ([facility], [facility], [facility] ONLY)    EKG EKG Interpretation  Date/Time:  Wednesday August 08 2019 18:20:36 EDT Ventricular Rate:  88 PR Interval:    QRS Duration: 94 QT Interval:  365 QTC Calculation: 442 R Axis:   -6 Text Interpretation: Sinus rhythm Low voltage, precordial leads Consider anterior infarct No significant change since last tracing Confirmed by Dorie Rank [id number]) on 08/08/2019 6:26:51 PM   Radiology MR BRAIN WO CONTRAST  Result Date: 08/08/2019 CLINICAL DATA:  Code stroke follow-up, history of idiopathic intracranial hypertension EXAM: MRI HEAD WITHOUT CONTRAST TECHNIQUE: Multiplanar, multiecho pulse sequences of the brain and surrounding structures were obtained without intravenous contrast. COMPARISON:  06/11/2019 FINDINGS: Brain: There is no acute infarction or intracranial hemorrhage. There is no intracranial mass, mass effect, or edema. There is no hydrocephalus or extra-axial fluid collection. Patchy small foci of T2 hyperintensity in the supratentorial white matter are nonspecific but may reflect mild chronic microvascular ischemic changes. Ventricles and sulci are normal in size and configuration. Vascular: Major vessel flow  voids at the skull base are preserved. Skull and upper cervical spine: Normal marrow signal is preserved. Sinuses/Orbits: Paranasal sinuses are aerated. Orbits are unremarkable. Other: Sella is partially empty.  Mastoid air cells are clear. IMPRESSION: No evidence of recent infarction, hemorrhage, or mass.  Mild chronic microvascular ischemic changes. Partially empty sella; nonspecific but can be seen in the setting of idiopathic intracranial hypertension. Electronically Signed   By: Macy Mis M.D.   On: 08/08/2019 19:36   CT HEAD CODE STROKE WO CONTRAST  Result Date: 08/08/2019 CLINICAL DATA:  Code stroke. 44 year old female with left leg weakness, last known well 1615 hours. EXAM: CT HEAD WITHOUT CONTRAST TECHNIQUE: Contiguous axial images were obtained from the base of the skull through the vertex without intravenous contrast. COMPARISON:  Head CT 07/29/2019 and earlier. FINDINGS: Brain: Cerebral volume is within normal limits. Gray-white matter differentiation appears stable and within normal limits. No midline shift, ventriculomegaly, mass effect, evidence of mass lesion, intracranial hemorrhage or evidence of cortically based acute infarction. Partially empty sella again noted. Vascular: No suspicious intracranial vascular hyperdensity. Skull: Stable, negative. Sinuses/Orbits: Visualized paranasal sinuses and mastoids are stable and well pneumatized. Other: Negative orbit and scalp soft tissues. ASPECTS [facility] Stroke Program Early CT Score) Total score (0-10 with 10 being normal): 10 IMPRESSION: 1. Stable and negative noncontrast CT appearance of the brain. 2. ASPECTS 10. 3. These results were communicated to Dr. Leonel Ramsay at [place] pm on 08/08/2019 by text page via the [facility] messaging system. Electronically Signed   By: Genevie Ann M.D.   On: 08/08/2019 18:12    Procedures Procedures (including critical care time)  Medications Ordered in ED Medications  sodium chloride flush (NS) 0.9 % injection 3 mL (3 mLs Intravenous Not Given 08/08/19 1814)    ED Course  I have reviewed the triage vital signs and the nursing notes.  Pertinent labs & imaging results that were available during my care of the patient were reviewed by me and considered in my medical decision making (see chart for  details).  Clinical Course as of Aug 08 2019  Wed Aug 08, 2019  1851 Laboratory tests reviewed.  No acute abnormalities noted.  CO2 decreased at 17 but I do not think this is clinically significant.  White blood cell count increased at 12.6 but I doubt this is clinically significant.  Head CT without acute findings.   [JK]  1852 Case was discussed with Dr. Leonel Ramsay.  Patient most likely is having a complex migraine.  We will proceed with MRI.  If negative she can be discharged   [JK]  1852 Patient does not require any medications for headache at this time.  She states it is tolerable   [JK]    Clinical Course User Index [JK] Dorie Rank, MD   MDM Rules/Calculators/A&P                      Patient presented with symptoms concerning for possible stroke.  Patient was evaluated in the ED emergently by Dr. Leonel Ramsay.  Symptoms felt to be most likely related to complex migraine.   Plan was for MRI to rule out any acute stroke pathology.  CT and MRI did not show any acute findings.  Patient states her headache was manageable and did not want any pain medications.  Patient is stable for discharge.  Findings and plan discussed with patient and her husband. Final Clinical Impression(s) / ED Diagnoses Final diagnoses:  Migraine without status migrainosus, not intractable,  unspecified migraine type    Rx / DC Orders ED Discharge Orders    None       Dorie Rank, MD 08/08/19 2022

## 2019-08-08 NOTE — ED Notes (Signed)
Patient transported to MRI 

## 2019-08-08 NOTE — Discharge Instructions (Addendum)
Continue your current medications.  Follow-up with your  doctor.  Return as needed for worsening symptoms 

## 2019-08-09 ENCOUNTER — Ambulatory Visit
Admission: RE | Admit: 2019-08-09 | Discharge: 2019-08-09 | Disposition: A | Discharge status: Home / Self Care | Payer: BC Managed Care – PPO | Source: Ambulatory Visit | Attending: Family Medicine | Admitting: Family Medicine

## 2019-08-09 ENCOUNTER — Other Ambulatory Visit: Payer: Self-pay | Admitting: Family Medicine

## 2019-08-09 DIAGNOSIS — R928 Other abnormal and inconclusive findings on diagnostic imaging of breast: Secondary | ICD-10-CM

## 2019-08-09 DIAGNOSIS — N6489 Other specified disorders of breast: Secondary | ICD-10-CM

## 2019-08-14 ENCOUNTER — Telehealth: Payer: Self-pay | Admitting: *Deleted

## 2019-08-14 NOTE — Progress Notes (Deleted)
[facility]    Provider:  Dr Jaynee Eagles Requesting Provider: [name], MD Primary Care Provider:  [name], MD  CC:  headache  HPI:  Shelley Ryan is a 44 y.o. female here as requested by [name], MD for headaches.PMHx PCOS, dural sinus thrombosis(2002), She does not have a history of headache per patient. No history of migraines, no history of migraines in the family. She has sleep apnea and is very compliant with cpap and she follows closely with a sleep doctor. This is day 8 of her headaches, mostly diffuse and like a poker on the left side of the head, base of the skull, nausea and vertigo, tinniitus and a whooshing in her ears.  Headache is worse laying down when flat, worse in the morning and better throughout the day. She has gained 40 pounds in the span of 2-3 months, she has had hormonal testing and has seen her primary care for this, she has an IUD, placed quite some time ago. Also vision changes, she has an ophthalmologist she just went and no papilledema. She has a lot of tightness in the cervical muscles as well that pre-dates the new symptoms will send for PT. She has left sided weakness and hemisensory loss from prior dural sinus thrombosis, worsening, may be recrudescence of symptoms as workup revealed no new issues. No other focal neurologic deficits, associated symptoms, inciting events or modifiable factors.  Reviewed notes, labs and imaging from outside physicians, which showed:  I reviewed MRI images of the brain February 2021 which were significant for partially empty sella which we can sometimes see in idiopathic intracranial hypertension.  I reviewed the reports of the MR angio of the head and neck which did not show any significant large or medium artery occlusions or stenosis, and MRV was normal.  I also reviewed labs.  Review of Systems: Patient complains of symptoms per HPI as well as the following symptoms: headache, hearing changes.  Pertinent negatives and positives per HPI. All others negative.   Social History   Socioeconomic History  . Marital status: Married    Spouse name: Not on file  . Number of children: Not on file  . Years of education: Not on file  . Highest education level: Not on file  Occupational History  . Not on file  Tobacco Use  . Smoking status: Never Smoker  . Smokeless tobacco: Never Used  Substance and Sexual Activity  . Alcohol use: No  . Drug use: No  . Sexual activity: Never    Birth control/protection: None  Other Topics Concern  . Not on file  Social History Narrative   Lives with husband    Right handed   Caffeine: 1 cup once a week   Social Determinants of Health   Financial Resource Strain:   . Difficulty of Paying Living Expenses:   Food Insecurity:   . Worried About Charity fundraiser in the Last Year:   . Arboriculturist in the Last Year:   Transportation Needs:   . Film/video editor (Medical):   Marland Kitchen Lack of Transportation (Non-Medical):   Physical Activity:   . Days of Exercise per Week:   . Minutes of Exercise per Session:   Stress:   . Feeling of Stress :   Social Connections:   . Frequency of Communication with Friends and Family:   . Frequency of Social Gatherings with Friends and Family:   . Attends Religious Services:   .  Active Member of Clubs or Organizations:   . Attends Archivist Meetings:   Marland Kitchen Marital Status:   Intimate Partner Violence:   . Fear of Current or Ex-Partner:   . Emotionally Abused:   Marland Kitchen Physically Abused:   . Sexually Abused:     Family History  Problem Relation Age of Onset  . Obesity Mother   . Hyperlipidemia Father   . Breast cancer Other        Great Grandmother   . Breast cancer Other   . Headache Neg Hx   . Migraines Neg Hx     Past Medical History:  Diagnosis Date  . Anemia   . Bilateral swelling of feet   . BRCA1 negative 10/08/2013  . CVA (cerebral infarction) 2002   Most likely from Hormonal  Tx  . 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 Gallbladder problem   . Gastric outlet obstruction   . Gastroparesis   . H/O blood clots   . Heartburn   . Hiatal hernia   . Hyperlipidemia   . Hypertension   . IIH (idiopathic intracranial hypertension)   . Inflammatory arthritis   . Joint pain   . Low vitamin D level    2013  17  . OSA (obstructive sleep apnea)   . PCOS (polycystic ovarian syndrome)    stroke on OCP hormonal therapy  . Prediabetes   . Pylorospasm   . Stroke (cerebrum) ([place])   . Vitamin D deficiency     Patient Active Problem List   Diagnosis Date Noted  . Esophageal hiatal hernia 06/20/2019  . Stroke ([place]) 06/20/2019  . IIH (idiopathic intracranial hypertension) 06/20/2019  . 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 Myopia with astigmatism, bilateral 01/05/2019  . Posterior vitreous detachment of both eyes 01/05/2019  . Blepharitis of both upper and lower eyelid 01/05/2019  . Family history of malignant hyperthermia 05/17/2016  . GERD (gastroesophageal reflux disease) 02/25/2016  . Chronic cholecystitis with calculus 10/30/2013  . Acute cholecystitis 10/10/2013  . Status post laparoscopic cholecystectomy June 2015 10/09/2013  . Obesity (BMI 30-39.9) 10/08/2013  . PCOS (polycystic ovarian syndrome)     Past Surgical History:  Procedure Laterality Date  . APPENDECTOMY    . CHOLECYSTECTOMY N/A 10/09/2013   Procedure: LAPAROSCOPIC CHOLECYSTECTOMY WITH INTRAOPERATIVE CHOLANGIOGRAM;  Surgeon: Pedro Earls, MD;  Location: [facility];  Service: General;  Laterality: N/A;  . COLON SURGERY    . KNEE ARTHROSCOPY Bilateral   . PYLOROPLASTY    . RIGHT OOPHORECTOMY    . SALPINGECTOMY Bilateral   . TYMPANOSTOMY TUBE PLACEMENT     x 2  . WISDOM TOOTH EXTRACTION      Current Outpatient Medications  Medication Sig Dispense Refill  . acetaminophen (TYLENOL)  325 MG tablet Take 650 mg by mouth every 6 (six) hours as needed for mild pain.     Marland Kitchen acetaZOLAMIDE (DIAMOX) 250 MG tablet Take 2 tablets (500 mg total) by mouth 2 (two) times daily. 120 tablet 5  . amitriptyline (ELAVIL) 25 MG tablet Take 25 mg by mouth at bedtime.    . famotidine (PEPCID) 40 MG tablet Take 40 mg by mouth 2 (two) times daily.    . Fremanezumab-vfrm (AJOVY) 225 MG/1.5ML SOAJ Inject 225 mg into the skin every 30 (thirty) days. 1.5 mL 5  . losartan (COZAAR) 50 MG tablet Take  0.5 tablets (25 mg total) by mouth daily. 30 tablet 0  . metFORMIN (GLUCOPHAGE) 500 MG tablet Take 500 mg by mouth 2 (two) times a day.    . Multiple Vitamins-Minerals (MULTIVITAMIN WITH MINERALS) tablet Take 1 tablet by mouth daily.    . ondansetron (ZOFRAN-ODT) 4 MG disintegrating tablet Take 4 mg by mouth every 4 (four) hours as needed for nausea or vomiting (DISSOLVE ORALLY).     Marland Kitchen prochlorperazine (COMPAZINE) 10 MG tablet Take 10 mg by mouth every 6 (six) hours as needed for nausea or vomiting.     Marland Kitchen Ubrogepant (UBRELVY) 50 MG TABS Take 50 mg by mouth as needed. At the onset of migraine- can repeat in 2 hours if needed (Patient taking differently: Take 50 mg by mouth See admin instructions. Take 50 mg by mouth at onset of a migraine and may repeat once in 2 hours, if no relief- seek medical attention if still not resolution after 2nd dose) 10 tablet 5  . Vitamin D, Ergocalciferol, (DRISDOL) 1.25 MG (50000 UNIT) CAPS capsule Take 1 capsule (50,000 Units total) by mouth every 7 (seven) days. (Patient taking differently: Take 50,000 Units by mouth every Monday. ) 4 capsule 0   No current facility-administered medications for this visit.    Allergies as of 08/15/2019 - Review Complete 08/08/2019  Allergen Reaction Noted  . Cisapride Anaphylaxis 10/20/2017  . Clarithromycin Nausea And Vomiting and Other (See Comments) 08/01/2012  . Estrogens Other (See Comments) 01/27/2016  . Tape Rash and Other (See Comments)  12/10/2015  . Cholestyramine Nausea And Vomiting 01/28/2016  . Ciprofloxacin Hives and Rash 10/08/2013  . Keflex [cephalexin] Nausea And Vomiting and Other (See Comments) 10/08/2013  . Metronidazole Diarrhea and Nausea And Vomiting 01/28/2016  . Minocycline hcl Nausea And Vomiting and Other (See Comments) 01/28/2016  . Tetracyclines & related Nausea And Vomiting and Other (See Comments) 10/08/2013  . Unasyn [ampicillin-sulbactam sodium] Nausea And Vomiting 10/08/2013  . Ampicillin Diarrhea 06/19/2019  . Codeine Itching, Rash, and Other (See Comments) 08/01/2012  . Metoclopramide Other (See Comments) 10/08/2013    Vitals: LMP 07/29/2019  Last Weight:  Wt Readings from Last 1 Encounters:  08/08/19 238 lb 15.7 oz (108.4 kg)   Last Height:   Ht Readings from Last 1 Encounters:  08/08/19 '5\' 7"'  (1.702 m)     Physical exam: Exam: Gen: NAD, conversant, well nourised, obese, well groomed                     CV: RRR, no MRG. No Carotid Bruits. No peripheral edema, warm, nontender Eyes: Conjunctivae clear without exudates or hemorrhage  Neuro: Detailed Neurologic Exam  Speech:    Speech is normal; fluent and spontaneous with normal comprehension.  Cognition:    The patient is oriented to person, place, and time;     recent and remote memory intact;     language fluent;     normal attention, concentration,     fund of knowledge Cranial Nerves:    The pupils are equal, round, and reactive to light. The fundi are flat. Visual fields are full to finger confrontation. Extraocular movements are intact. Trigeminal sensation is intact and the muscles of mastication are normal. The face is symmetric. The palate elevates in the midline. Hearing intact. Voice is normal. Shoulder shrug is normal. The tongue has normal motion without fasciculations.   Coordination:    No dysmetria  Gait:    Normal native gait  Motor Observation:  No asymmetry, no atrophy, and no involuntary movements  noted. Tone:    Normal muscle tone.    Posture:    Posture is normal. normal erect    Strength: left arm 4/5 throughout otherwise strength is V/V in the upper and lower limbs.      Sensation: diminished sensation left side face, arm and leg     Reflex Exam:  DTR's:   Absent AJs otherwise deep tendon reflexes in the upper and lower extremities are normal bilaterally.   Toes:    The toes are downgoing bilaterally.   Clonus:    Clonus is absent.    Assessment/Plan:  44 year old with headaches, no history of migraines. She has multiple risk actors for IDIOPATHIC INTRACRANIAL HYPERTENSION including dural venous sinus thrombosis, PCOS, IUD, recent weight gain, tetracycline in the past for 4 months. Needs LP.  She has left sided weakness and hemisensory loss from prior dural sinus thrombosis, worsening, may be recrudescence of symptoms as workup revealed no new issues. MRI showed partially empty sella.  - Referral to [facility] for obesity - I called [facility] and we expedited Lumbar Puncture, scheduled in the morning - [facility] in [place] for PT and dry needling for cervical myofascial pain syndrome  Addendum: Opening pressure not significantly elevated at 22 and this does not meet clinical criteria for IDIOPATHIC INTRACRANIAL HYPERTENSION. We can try Diamox and see if this helps given her symptoms and MRI with partially empty sella.  CSF labs were normal.  Starting Diamox.   No orders of the defined types were placed in this encounter.    Cc: [name], MD,    Sarina Ill, MD  [facility] [address] [place], [place] [address]  Phone [phone] Fax [phone]

## 2019-08-14 NOTE — Telephone Encounter (Signed)
Per Dr. Lucia Gaskins, I called the pt d/t having appt scheduled for tomorrow that was made before she was last seen by NP. Pt stated she was still planning to come tomorrow. She said she is not better. She has had a headache x 48 hours. She reports being in the ER last week again with another hemiplegic and aphasia episode. She reports every couple of weeks she is in the ER with hemiplegic symptoms. She said she missed work last Friday due to vertigo. She has had transient double vision and blurry vision. She is keeping a h/a diary. Preventive has not started working. Bernita Raisin only works 50% of the time. Pulsatile tinnitus has increased and wakes her up at night. Appt tomorrow is at 1 pm.

## 2019-08-15 ENCOUNTER — Ambulatory Visit: Payer: BC Managed Care – PPO | Admitting: Neurology

## 2019-08-15 ENCOUNTER — Other Ambulatory Visit: Payer: Self-pay

## 2019-08-15 ENCOUNTER — Ambulatory Visit
Admission: RE | Admit: 2019-08-15 | Discharge: 2019-08-15 | Disposition: A | Discharge status: Home / Self Care | Payer: BC Managed Care – PPO | Source: Ambulatory Visit | Attending: Family Medicine | Admitting: Family Medicine

## 2019-08-15 DIAGNOSIS — N6489 Other specified disorders of breast: Secondary | ICD-10-CM

## 2019-08-20 ENCOUNTER — Encounter: Payer: Self-pay | Admitting: Neurology

## 2019-08-20 ENCOUNTER — Ambulatory Visit: Payer: BC Managed Care – PPO | Admitting: Neurology

## 2019-08-20 ENCOUNTER — Other Ambulatory Visit: Payer: Self-pay

## 2019-08-20 VITALS — BP 125/82 | HR 90 | Temp 97.5°F | Ht 66.0 in | Wt 235.0 lb

## 2019-08-20 DIAGNOSIS — G43409 Hemiplegic migraine, not intractable, without status migrainosus: Secondary | ICD-10-CM

## 2019-08-20 DIAGNOSIS — R519 Headache, unspecified: Secondary | ICD-10-CM

## 2019-08-20 DIAGNOSIS — G43119 Migraine with aura, intractable, without status migrainosus: Secondary | ICD-10-CM

## 2019-08-20 DIAGNOSIS — Z86718 Personal history of other venous thrombosis and embolism: Secondary | ICD-10-CM

## 2019-08-20 DIAGNOSIS — M7918 Myalgia, other site: Secondary | ICD-10-CM

## 2019-08-20 DIAGNOSIS — G8929 Other chronic pain: Secondary | ICD-10-CM

## 2019-08-20 MED ORDER — RIZATRIPTAN BENZOATE 10 MG PO TBDP: 10.0000 mg | ORAL_TABLET | ORAL | 11 refills | Status: DC | PRN

## 2019-08-20 MED ORDER — TOPIRAMATE 100 MG PO TABS: 100.0000 mg | ORAL_TABLET | Freq: Every day | ORAL | 6 refills | Status: DC

## 2019-08-20 NOTE — Progress Notes (Signed)
[facility]    Provider:  Dr Jaynee Eagles Requesting Provider: [name], MD Primary Care Provider:  [name], MD  CC:  Headache  Interval history: Opening pressure was only 22 and she is having side effects to the diamox and really not helping. 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 Also more word-finding problems. Transient aphasia and hemiparesis. Diamox has not helped anything so I doubt the IDIOPATHIC INTRACRANIAL HYPERTENSION diagnosis. Interfering with life. She started Ajovy on the 6th of April, will continue that. Will change diamox to Topiramate. She enies any changes in life or stress in her home life but no more stress than normal at work. No papilledema.   HPI:  Shelley Ryan is a 44 y.o. female here as requested by [name], MD for headaches.PMHx PCOS, dural sinus thrombosis(2002), She does not have a history of headache per patient. No history of migraines, no history of migraines in the family. She has sleep apnea and is very compliant with cpap and she follows closely with a sleep doctor. This is day 8 of her headaches, mostly diffuse and like a poker on the left side of the head, base of the skull, nausea and vertigo, tinniitus and a whooshing in her ears.  Headache is worse laying down when flat, worse in the morning and better throughout the day. She has gained 40 pounds in the span of 2-3 months, she has had hormonal testing and has seen her primary care for this, she has an IUD, placed quite some time ago. Also vision changes, she has an ophthalmologist she just went and no papilledema. She has a lot of tightness in the cervical muscles as well that pre-dates the new symptoms will send for PT. She has left sided weakness and hemisensory loss from prior dural sinus thrombosis, worsening, may be recrudescence of symptoms as workup revealed no new issues. No other focal  neurologic deficits, associated symptoms, inciting events or modifiable factors.  Reviewed notes, labs and imaging from outside physicians, which showed:  I reviewed MRI images of the brain February 2021 which were significant for partially empty sella which we can sometimes see in idiopathic intracranial hypertension.  I reviewed the reports of the MR angio of the head and neck which did not show any significant large or medium artery occlusions or stenosis, and MRV was normal.  I also reviewed labs.  Review of Systems: Patient complains of symptoms per HPI as well as the following symptoms: headache, hearing changes. Pertinent negatives and positives per HPI. All others negative.   Social History   Socioeconomic History  . Marital status: Married    Spouse name: Not on file  . Number of children: Not on file  . Years of education: Not on file  . Highest education level: Not on file  Occupational History  . Not on file  Tobacco Use  . Smoking status: Never Smoker  . Smokeless tobacco: Never Used  Substance and Sexual Activity  . Alcohol use: No  . Drug use: No  . Sexual activity: Never    Birth control/protection: None  Other Topics Concern  . Not on file  Social History Narrative   Lives with husband    Right handed   Caffeine: 1 cup once a week. Update 08/20/2019 not drinking any right now.   Social Determinants of Health   Financial Resource Strain:   . Difficulty of Paying  Living Expenses:   Food Insecurity:   . Worried About Charity fundraiser in the Last Year:   . Arboriculturist in the Last Year:   Transportation Needs:   . Film/video editor (Medical):   Marland Kitchen Lack of Transportation (Non-Medical):   Physical Activity:   . Days of Exercise per Week:   . Minutes of Exercise per Session:   Stress:   . Feeling of Stress :   Social Connections:   . Frequency of Communication with Friends and Family:   . Frequency of Social Gatherings with Friends and Family:    . Attends Religious Services:   . Active Member of Clubs or Organizations:   . Attends Archivist Meetings:   Marland Kitchen Marital Status:   Intimate Partner Violence:   . Fear of Current or Ex-Partner:   . Emotionally Abused:   Marland Kitchen Physically Abused:   . Sexually Abused:     Family History  Problem Relation Age of Onset  . Obesity Mother   . Hyperlipidemia Father   . Breast cancer Other        Great Grandmother   . Breast cancer Other   . Headache Neg Hx   . Migraines Neg Hx     Past Medical History:  Diagnosis Date  . Anemia   . Bilateral swelling of feet   . BRCA1 negative 10/08/2013  . CVA (cerebral infarction) 2002   Most likely from Hormonal Tx  . 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 Gallbladder problem   . Gastric outlet obstruction   . Gastroparesis   . H/O blood clots   . Heartburn   . Hiatal hernia   . Hyperlipidemia   . Hypertension   . IIH (idiopathic intracranial hypertension)   . Inflammatory arthritis   . Joint pain   . Low vitamin D level    2013  17  . OSA (obstructive sleep apnea)   . PCOS (polycystic ovarian syndrome)    stroke on OCP hormonal therapy  . Prediabetes   . Pylorospasm   . Stroke (cerebrum) ([place])   . Vitamin D deficiency     Patient Active Problem List   Diagnosis Date Noted  . Esophageal hiatal hernia 06/20/2019  . Stroke ([place]) 06/20/2019  . 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 Myopia with astigmatism, bilateral 01/05/2019  . Posterior vitreous detachment of both eyes 01/05/2019  . Blepharitis of both upper and lower eyelid 01/05/2019  . Family history of malignant hyperthermia 05/17/2016  . GERD (gastroesophageal reflux disease) 02/25/2016  . Chronic cholecystitis with calculus 10/30/2013  . Acute cholecystitis 10/10/2013  . Status post laparoscopic cholecystectomy June 2015 10/09/2013  . Obesity (BMI  30-39.9) 10/08/2013  . PCOS (polycystic ovarian syndrome)     Past Surgical History:  Procedure Laterality Date  . APPENDECTOMY    . CHOLECYSTECTOMY N/A 10/09/2013   Procedure: LAPAROSCOPIC CHOLECYSTECTOMY WITH INTRAOPERATIVE CHOLANGIOGRAM;  Surgeon: Pedro Earls, MD;  Location: [facility];  Service: General;  Laterality: N/A;  . COLON SURGERY    . KNEE ARTHROSCOPY Bilateral   . PYLOROPLASTY    . RIGHT OOPHORECTOMY    . SALPINGECTOMY Bilateral   . TYMPANOSTOMY TUBE PLACEMENT     x 2  . WISDOM TOOTH EXTRACTION      Current Outpatient Medications  Medication Sig Dispense Refill  . acetaminophen (  TYLENOL) 325 MG tablet Take 650 mg by mouth every 6 (six) hours as needed for mild pain.     Marland Kitchen amitriptyline (ELAVIL) 25 MG tablet Take 25 mg by mouth at bedtime.    . famotidine (PEPCID) 40 MG tablet Take 40 mg by mouth 2 (two) times daily.    . Fremanezumab-vfrm (AJOVY) 225 MG/1.5ML SOAJ Inject 225 mg into the skin every 30 (thirty) days. 1.5 mL 5  . losartan (COZAAR) 50 MG tablet Take 0.5 tablets (25 mg total) by mouth daily. 30 tablet 0  . metFORMIN (GLUCOPHAGE) 500 MG tablet Take 500 mg by mouth 2 (two) times a day.    . Multiple Vitamins-Minerals (MULTIVITAMIN WITH MINERALS) tablet Take 1 tablet by mouth daily.    . ondansetron (ZOFRAN-ODT) 4 MG disintegrating tablet Take 4 mg by mouth every 4 (four) hours as needed for nausea or vomiting (DISSOLVE ORALLY).     Marland Kitchen prochlorperazine (COMPAZINE) 10 MG tablet Take 10 mg by mouth every 6 (six) hours as needed for nausea or vomiting.     Marland Kitchen Ubrogepant (UBRELVY) 50 MG TABS Take 50 mg by mouth as needed. At the onset of migraine- can repeat in 2 hours if needed (Patient taking differently: Take 50 mg by mouth See admin instructions. Take 50 mg by mouth at onset of a migraine and may repeat once in 2 hours, if no relief- seek medical attention if still not resolution after 2nd dose) 10 tablet 5  . Vitamin D, Ergocalciferol, (DRISDOL) 1.25 MG (50000  UNIT) CAPS capsule Take 1 capsule (50,000 Units total) by mouth every 7 (seven) days. (Patient taking differently: Take 50,000 Units by mouth every Monday. ) 4 capsule 0  . rizatriptan (MAXALT-MLT) 10 MG disintegrating tablet Take 1 tablet (10 mg total) by mouth as needed for migraine. May repeat in 2 hours if needed 9 tablet 11  . topiramate (TOPAMAX) 100 MG tablet Take 1 tablet (100 mg total) by mouth at bedtime. 90 tablet 6   No current facility-administered medications for this visit.    Allergies as of 08/20/2019 - Review Complete 08/20/2019  Allergen Reaction Noted  . Cisapride Anaphylaxis 10/20/2017  . Clarithromycin Nausea And Vomiting and Other (See Comments) 08/01/2012  . Estrogens Other (See Comments) 01/27/2016  . Tape Rash and Other (See Comments) 12/10/2015  . Cholestyramine Nausea And Vomiting 01/28/2016  . Ciprofloxacin Hives and Rash 10/08/2013  . Keflex [cephalexin] Nausea And Vomiting and Other (See Comments) 10/08/2013  . Metronidazole Diarrhea and Nausea And Vomiting 01/28/2016  . Minocycline hcl Nausea And Vomiting and Other (See Comments) 01/28/2016  . Tetracyclines & related Nausea And Vomiting and Other (See Comments) 10/08/2013  . Unasyn [ampicillin-sulbactam sodium] Nausea And Vomiting 10/08/2013  . Ampicillin Diarrhea 06/19/2019  . Codeine Itching, Rash, and Other (See Comments) 08/01/2012  . Metoclopramide Other (See Comments) 10/08/2013    Vitals: BP 125/82 (BP Location: Right Arm, Patient Position: Sitting)   Pulse 90   Temp (!) 97.5 F (36.4 C) Comment: taken at front  Ht _0  (1.676 m)   Wt 235 lb (106.6 kg)   LMP 07/29/2019   BMI 37.93 kg/m  Last Weight:  Wt Readings from Last 1 Encounters:  08/20/19 235 lb (106.6 kg)   Last Height:   Ht Readings from Last 1 Encounters:  08/20/19 _1  (1.676 m)     Physical exam: Exam: Gen: NAD, conversant, well nourised, obese, well groomed  CV: RRR, no MRG. No Carotid Bruits. No  peripheral edema, warm, nontender Eyes: Conjunctivae clear without exudates or hemorrhage  Neuro: Detailed Neurologic Exam  Speech:    Speech is normal; fluent and spontaneous with normal comprehension.  Cognition:    The patient is oriented to person, place, and time;     recent and remote memory intact;     language fluent;     normal attention, concentration,     fund of knowledge Cranial Nerves:    The pupils are equal, round, and reactive to light. The fundi are flat. Visual fields are full to finger confrontation. Extraocular movements are intact. Trigeminal sensation is intact and the muscles of mastication are normal. The face is symmetric. The palate elevates in the midline. Hearing intact. Voice is normal. Shoulder shrug is normal. The tongue has normal motion without fasciculations.   Coordination:    No dysmetria  Gait:    Normal native gait  Motor Observation:    No asymmetry, no atrophy, and no involuntary movements noted. Tone:    Normal muscle tone.    Posture:    Posture is normal. normal erect    Strength: left arm 4/5 throughout otherwise strength is V/V in the upper and lower limbs.      Sensation: diminished sensation left side face, arm and leg     Reflex Exam:  DTR's:   Absent AJs otherwise deep tendon reflexes in the upper and lower extremities are normal bilaterally.   Toes:    The toes are downgoing bilaterally.   Clonus:    Clonus is absent.    Assessment/Plan:  44 year old with headaches, no history of migraines. She has multiple risk actors for IDIOPATHIC INTRACRANIAL HYPERTENSION including hx of dural venous sinus thrombosis, PCOS, IUD, recent weight gain, tetracycline in the past for 4 months.  She has left sided weakness and hemisensory loss from prior dural sinus thrombosis, worsening, may be recrudescence of symptoms as workup revealed no new issues(MRI/CTA/MRV). MRI showed partially empty sella.   - Opening pressure only 22, does not  fit criteria for IDIOPATHIC INTRACRANIAL HYPERTENSION and the diamox is not helping whatsoever (on high dose). She recently went to the ED and was diagnosed with hemiplegic migraines(?). Very unclear what is going on here, we will refer to academic center as patient has a complicated history and unusual presentation. - stop diamox, start topamax - continue ajovy - [facility] stated they wanted me to order overight O2 monitor? We will call and see why we have to order it and not the sleep team at [facility] if they want it? Will call Carlos Levering NP [facility] - Try maxalt acutely with ondansetron Roselyn Meier not effective) - switch from Diamox to Topiramate, can try other medications and/or botox. - Referral to academic headache clinic - she is on a cpap machine and compliant - Referral to the [facility] center for obesity: she is going - I called [facility] and we expedited Lumbar Puncture, scheduled in the morning, opening pressure only 22 - [facility] in [place] for PT and dry needling for cervical myofascial pain syndrome - she went and is finishing up with them   Orders Placed This Encounter  Procedures  . Ambulatory referral to Neurology  . Ambulatory referral to Neurology   Meds ordered this encounter  Medications  . topiramate (TOPAMAX) 100 MG tablet    Sig: Take 1 tablet (100 mg total) by mouth at bedtime.  Dispense:  90 tablet    Refill:  6  . rizatriptan (MAXALT-MLT) 10 MG disintegrating tablet    Sig: Take 1 tablet (10 mg total) by mouth as needed for migraine. May repeat in 2 hours if needed    Dispense:  9 tablet    Refill:  11   Discussed: To prevent or relieve headaches, try the following: Cool Compress. Lie down and place a cool compress on your head.  Avoid headache triggers. If certain foods or odors seem to have triggered your migraines in the past, avoid them. A headache diary might help you identify triggers.  Include  physical activity in your daily routine. Try a daily walk or other moderate aerobic exercise.  Manage stress. Find healthy ways to cope with the stressors, such as delegating tasks on your to-do list.  Practice relaxation techniques. Try deep breathing, yoga, massage and visualization.  Eat regularly. Eating regularly scheduled meals and maintaining a healthy diet might help prevent headaches. Also, drink plenty of fluids.  Follow a regular sleep schedule. Sleep deprivation might contribute to headaches Consider biofeedback. With this mind-body technique, you learn to control certain bodily functions - such as muscle tension, heart rate and blood pressure - to prevent headaches or reduce headache pain.    Proceed to emergency room if you experience new or worsening symptoms or symptoms do not resolve, if you have new neurologic symptoms or if headache is severe, or for any concerning symptom.   Provided education and documentation from American headache Society toolbox including articles on: chronic migraine medication overuse headache, chronic migraines, prevention of migraines, behavioral and other nonpharmacologic treatments for headache.   Cc: [name], MD,  Carlos Levering NP [place], MD  [facility] [address], [place] [address]  Phone [phone] Fax [phone]  I spent 30 minutes of face-to-face and non-face-to-face time with patient on the  1. Intractable migraine with aura without status migrainosus   2. Cervical myofascial pain syndrome   3. Hx of cerebral venous sinus thrombosis   4. Chronic intractable headache, unspecified headache type   5. Hemiplegic migraine without status migrainosus, not intractable    diagnosis.  This included previsit chart review, lab review, study review, order entry, electronic health record documentation, patient education on the different diagnostic and therapeutic  options, counseling and coordination of care, risks and benefits of management, compliance, or risk factor reduction

## 2019-08-20 NOTE — Patient Instructions (Signed)
- Referral to [facility] and [facility] - titrate off diamox, start topamax - continue ajovy - Will call Carilyn Goodpasture NP [facility] - Try maxalt acutely with ondansetron Bernita Raisin not effective) - switch from Diamox to Topiramate, can try other medications and/or botox.in the future  Rizatriptan disintegrating tablets What is this medicine? RIZATRIPTAN (rye za TRIP tan) is used to treat migraines with or without aura. An aura is a strange feeling or visual disturbance that warns you of an attack. It is not used to prevent migraines. This medicine may be used for other purposes; ask your health care provider or pharmacist if you have questions. COMMON BRAND NAME(S): Maxalt-MLT What should I tell my health care provider before I take this medicine? They need to know if you have any of these conditions:  cigarette smoker  circulation problems in fingers and toes  diabetes  heart disease  high blood pressure  high cholesterol  history of irregular heartbeat  history of stroke  kidney disease  liver disease  stomach or intestine problems  an unusual or allergic reaction to rizatriptan, other medicines, foods, dyes, or preservatives  pregnant or trying to get pregnant  breast-feeding How should I use this medicine? Take this medicine by mouth. Follow the directions on the prescription label. Leave the tablet in the sealed blister pack until you are ready to take it. With dry hands, open the blister and gently remove the tablet. If the tablet breaks or crumbles, throw it away and take a new tablet out of the blister pack. Place the tablet in the mouth and allow it to dissolve, and then swallow. Do not cut, crush, or chew this medicine. You do not need water to take this medicine. Do not take it more often than directed. Talk to your pediatrician regarding the use of this medicine in children. While this drug may be prescribed for children as young as 6 years for  selected conditions, precautions do apply. Overdosage: If you think you have taken too much of this medicine contact a poison control center or emergency room at once. NOTE: This medicine is only for you. Do not share this medicine with others. What if I miss a dose? This does not apply. This medicine is not for regular use. What may interact with this medicine? Do not take this medicine with any of the following medicines:  certain medicines for migraine headache like almotriptan, eletriptan, frovatriptan, naratriptan, rizatriptan, sumatriptan, zolmitriptan  ergot alkaloids like dihydroergotamine, ergonovine, ergotamine, methylergonovine  MAOIs like Carbex, Eldepryl, Marplan, Nardil, and Parnate This medicine may also interact with the following medications:  certain medicines for depression, anxiety, or psychotic disorders  propranolol This list may not describe all possible interactions. Give your health care provider a list of all the medicines, herbs, non-prescription drugs, or dietary supplements you use. Also tell them if you smoke, drink alcohol, or use illegal drugs. Some items may interact with your medicine. What should I watch for while using this medicine? Visit your healthcare professional for regular checks on your progress. Tell your healthcare professional if your symptoms do not start to get better or if they get worse. You may get drowsy or dizzy. Do not drive, use machinery, or do anything that needs mental alertness until you know how this medicine affects you. Do not stand up or sit up quickly, especially if you are an older patient. This reduces the risk of dizzy or fainting spells. Alcohol may interfere with the effect of  this medicine. Your mouth may get dry. Chewing sugarless gum or sucking hard candy and drinking plenty of water may help. Contact your healthcare professional if the problem does not go away or is severe. If you take migraine medicines for 10 or more  days a month, your migraines may get worse. Keep a diary of headache days and medicine use. Contact your healthcare professional if your migraine attacks occur more frequently. What side effects may I notice from receiving this medicine? Side effects that you should report to your doctor or health care professional as soon as possible:  allergic reactions like skin rash, itching or hives, swelling of the face, lips, or tongue  chest pain or chest tightness  signs and symptoms of a dangerous change in heartbeat or heart rhythm like chest pain; dizziness; fast, irregular heartbeat; palpitations; feeling faint or lightheaded; falls; breathing problems  signs and symptoms of a stroke like changes in vision; confusion; trouble speaking or understanding; severe headaches; sudden numbness or weakness of the face, arm or leg; trouble walking; dizziness; loss of balance or coordination  signs and symptoms of serotonin syndrome like irritable; confusion; diarrhea; fast or irregular heartbeat; muscle twitching; stiff muscles; trouble walking; sweating; high fever; seizures; chills; vomiting Side effects that usually do not require medical attention (report to your doctor or health care professional if they continue or are bothersome):  diarrhea  dizziness  drowsiness  dry mouth  headache  nausea, vomiting  pain, tingling, numbness in the hands or feet  stomach pain This list may not describe all possible side effects. Call your doctor for medical advice about side effects. You may report side effects to FDA at 1-800-FDA-1088. Where should I keep my medicine? Keep out of the reach of children. Store at room temperature between 15 and 30 degrees C (59 and 86 degrees F). Protect from light and moisture. Throw away any unused medicine after the expiration date. NOTE: This sheet is a summary. It may not cover all possible information. If you have questions about this medicine, talk to your doctor,  pharmacist, or health care provider.  2020 Elsevier/Gold Standard (2017-10-25 14:58:08) Topiramate tablets What is this medicine? TOPIRAMATE (toe PYRE a mate) is used to treat seizures in adults or children with epilepsy. It is also used for the prevention of migraine headaches. This medicine may be used for other purposes; ask your health care provider or pharmacist if you have questions. COMMON BRAND NAME(S): Topamax, Topiragen What should I tell my health care provider before I take this medicine? They need to know if you have any of these conditions:  bleeding disorders  kidney disease  lung or breathing disease, like asthma  suicidal thoughts, plans, or attempt; a previous suicide attempt by you or a family member  an unusual or allergic reaction to topiramate, other medicines, foods, dyes, or preservatives  pregnant or trying to get pregnant  breast-feeding How should I use this medicine? Take this medicine by mouth with a glass of water. Follow the directions on the prescription label. Do not cut, crush or chew this medicine. Swallow the tablets whole. You can take it with or without food. If it upsets your stomach, take it with food. Take your medicine at regular intervals. Do not take it more often than directed. Do not stop taking except on your doctor's advice. A special MedGuide will be given to you by the pharmacist with each prescription and refill. Be sure to read this information carefully each time. Talk  to your pediatrician regarding the use of this medicine in children. While this drug may be prescribed for children as young as 52 years of age for selected conditions, precautions do apply. Overdosage: If you think you have taken too much of this medicine contact a poison control center or emergency room at once. NOTE: This medicine is only for you. Do not share this medicine with others. What if I miss a dose? If you miss a dose, take it as soon as you can. If your  next dose is to be taken in less than 6 hours, then do not take the missed dose. Take the next dose at your regular time. Do not take double or extra doses. What may interact with this medicine? This medicine may interact with the following medications:  acetazolamide  alcohol  antihistamines for allergy, cough, and cold  aspirin and aspirin-like medicines  atropine  birth control pills  certain medicines for anxiety or sleep  certain medicines for bladder problems like oxybutynin, tolterodine  certain medicines for depression like amitriptyline, fluoxetine, sertraline  certain medicines for seizures like carbamazepine, phenobarbital, phenytoin, primidone, valproic acid, zonisamide  certain medicines for stomach problems like dicyclomine, hyoscyamine  certain medicines for travel sickness like scopolamine  certain medicines for Parkinson's disease like benztropine, trihexyphenidyl  certain medicines that treat or prevent blood clots like warfarin, enoxaparin, dalteparin, apixaban, dabigatran, and rivaroxaban  digoxin  general anesthetics like halothane, isoflurane, methoxyflurane, propofol  hydrochlorothiazide  ipratropium  lithium  medicines that relax muscles for surgery  metformin  narcotic medicines for pain  NSAIDs, medicines for pain and inflammation, like ibuprofen or naproxen  phenothiazines like chlorpromazine, mesoridazine, prochlorperazine, thioridazine  pioglitazone This list may not describe all possible interactions. Give your health care provider a list of all the medicines, herbs, non-prescription drugs, or dietary supplements you use. Also tell them if you smoke, drink alcohol, or use illegal drugs. Some items may interact with your medicine. What should I watch for while using this medicine? Visit your doctor or health care professional for regular checks on your progress. Tell your health care professional if your symptoms do not start to get  better or if they get worse. Do not stop taking except on your health care professional's advice. You may develop a severe reaction. Your health care professional will tell you how much medicine to take. Wear a medical ID bracelet or chain. Carry a card that describes your disease and details of your medicine and dosage times. This medicine can reduce the response of your body to heat or cold. Dress warm in cold weather and stay hydrated in hot weather. If possible, avoid extreme temperatures like saunas, hot tubs, very hot or cold showers, or activities that can cause dehydration such as vigorous exercise. Check with your health care professional if you have severe diarrhea, nausea, and vomiting, or if you sweat a lot. The loss of too much body fluid may make it dangerous for you to take this medicine. You may get drowsy or dizzy. Do not drive, use machinery, or do anything that needs mental alertness until you know how this medicine affects you. Do not stand up or sit up quickly, especially if you are an older patient. This reduces the risk of dizzy or fainting spells. Alcohol may interfere with the effect of this medicine. Avoid alcoholic drinks. Tell your health care professional right away if you have any change in your eyesight. Patients and their families should watch out for new or  worsening depression or thoughts of suicide. Also watch out for sudden changes in feelings such as feeling anxious, agitated, panicky, irritable, hostile, aggressive, impulsive, severely restless, overly excited and hyperactive, or not being able to sleep. If this happens, especially at the beginning of treatment or after a change in dose, call your healthcare professional. This medicine may cause serious skin reactions. They can happen weeks to months after starting the medicine. Contact your health care provider right away if you notice fevers or flu-like symptoms with a rash. The rash may be red or purple and then turn  into blisters or peeling of the skin. Or, you might notice a red rash with swelling of the face, lips or lymph nodes in your neck or under your arms. Birth control may not work properly while you are taking this medicine. Talk to your health care professional about using an extra method of birth control. Women should inform their health care professional if they wish to become pregnant or think they might be pregnant. There is a potential for serious side effects and harm to an unborn child. Talk to your health care professional for more information. What side effects may I notice from receiving this medicine? Side effects that you should report to your doctor or health care professional as soon as possible:  allergic reactions like skin rash, itching or hives, swelling of the face, lips, or tongue  blood in the urine  changes in vision  confusion  loss of memory  pain in lower back or side  pain when urinating  redness, blistering, peeling or loosening of the skin, including inside the mouth  signs and symptoms of bleeding such as bloody or black, tarry stools; red or dark brown urine; spitting up blood or brown material that looks like coffee grounds; red spots on the skin; unusual bruising or bleeding from the eyes, gums, or nose  signs and symptoms of increased acid in the body like breathing fast; fast heartbeat; headache; confusion; unusually weak or tired; nausea, vomiting  suicidal thoughts, mood changes  trouble speaking or understanding  unusual sweating  unusually weak or tired Side effects that usually do not require medical attention (report to your doctor or health care professional if they continue or are bothersome):  dizziness  drowsiness  fever  loss of appetite  nausea, vomiting  pain, tingling, numbness in the hands or feet  stomach pain  tiredness  upset stomach This list may not describe all possible side effects. Call your doctor for medical  advice about side effects. You may report side effects to FDA at 1-800-FDA-1088. Where should I keep my medicine? Keep out of the reach of children. Store at room temperature between 15 and 30 degrees C (59 and 86 degrees F). Throw away any unused medicine after the expiration date. NOTE: This sheet is a summary. It may not cover all possible information. If you have questions about this medicine, talk to your doctor, pharmacist, or health care provider.  2020 Elsevier/Gold Standard (2018-11-09 15:07:20)

## 2019-08-23 ENCOUNTER — Ambulatory Visit (INDEPENDENT_AMBULATORY_CARE_PROVIDER_SITE_OTHER): Payer: BC Managed Care – PPO | Admitting: Family Medicine

## 2019-08-23 ENCOUNTER — Telehealth: Payer: Self-pay | Admitting: Neurology

## 2019-08-23 ENCOUNTER — Encounter (INDEPENDENT_AMBULATORY_CARE_PROVIDER_SITE_OTHER): Payer: Self-pay | Admitting: Family Medicine

## 2019-08-23 ENCOUNTER — Encounter: Payer: Self-pay | Admitting: Neurology

## 2019-08-23 ENCOUNTER — Other Ambulatory Visit: Payer: Self-pay

## 2019-08-23 ENCOUNTER — Ambulatory Visit: Payer: BC Managed Care – PPO | Admitting: Neurology

## 2019-08-23 VITALS — BP 122/96 | HR 98 | Temp 97.5°F | Ht 66.0 in | Wt 229.0 lb

## 2019-08-23 VITALS — BP 114/70 | HR 95 | Temp 98.0°F | Ht 67.0 in | Wt 229.0 lb

## 2019-08-23 DIAGNOSIS — E559 Vitamin D deficiency, unspecified: Secondary | ICD-10-CM | POA: Diagnosis not present

## 2019-08-23 DIAGNOSIS — R519 Headache, unspecified: Secondary | ICD-10-CM | POA: Diagnosis not present

## 2019-08-23 DIAGNOSIS — I10 Essential (primary) hypertension: Secondary | ICD-10-CM

## 2019-08-23 DIAGNOSIS — E66812 Obesity, class 2: Secondary | ICD-10-CM

## 2019-08-23 DIAGNOSIS — Z6835 Body mass index (BMI) 35.0-35.9, adult: Secondary | ICD-10-CM

## 2019-08-23 MED ORDER — TOPIRAMATE 100 MG PO TABS: 100.0000 mg | ORAL_TABLET | Freq: Two times a day (BID) | ORAL | 3 refills | Status: DC

## 2019-08-23 NOTE — Progress Notes (Signed)
Infusion orders written per Dr. Lucia Gaskins for:  Depacon 1 gram IV x 1 Solumedrol 250  Mg IV x 1  Compazine 10 mg IV x  Toradol 30 mg IV x 1

## 2019-08-23 NOTE — Telephone Encounter (Signed)
Per Dr. Lucia Gaskins, offer 12 pm appt today. I called pt. She will come.

## 2019-08-23 NOTE — Telephone Encounter (Signed)
Pt states that she is still not feeling well. She states she is having nausea and vertigo and is wanting to know if she can be seen today. Please advise.

## 2019-08-23 NOTE — Progress Notes (Signed)
[id number]    Provider:  Dr Jaynee Eagles Requesting Provider: [name], MD Primary Care Provider:  [name], MD  CC:  Headache   Interval history August 23, 2019: Patient is here today with an 8 out of 10 headache.  Her blood pressure is normal.  She looks uncomfortable however bright light to examine her fundi did not cause any visible discomfort.  We will give her a migraine cocktail, will increase her Topamax to 100 mg twice daily, and will follow up on her referral to [facility].  Interval history: Opening pressure was only 22 and she is having side effects to the diamox and really not helping. 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 Also more word-finding problems. Transient aphasia and hemiparesis. Diamox has not helped anything so I doubt the IDIOPATHIC INTRACRANIAL HYPERTENSION diagnosis. Interfering with life. She started Ajovy on the 6th of April, will continue that. Will change diamox to Topiramate. She enies any changes in life or stress in her home life but no more stress than normal at work. No papilledema.   HPI:  Shelley Ryan is a 44 y.o. female here as requested by [name], MD for headaches.PMHx PCOS, dural sinus thrombosis(2002), She does not have a history of headache per patient. No history of migraines, no history of migraines in the family. She has sleep apnea and is very compliant with cpap and she follows closely with a sleep doctor. This is day 8 of her headaches, mostly diffuse and like a poker on the left side of the head, base of the skull, nausea and vertigo, tinniitus and a whooshing in her ears.  Headache is worse laying down when flat, worse in the morning and better throughout the day. She has gained 40 pounds in the span of 2-3 months, she has had hormonal testing and has seen her primary care for this, she has an IUD, placed quite some time ago. Also vision  changes, she has an ophthalmologist she just went and no papilledema. She has a lot of tightness in the cervical muscles as well that pre-dates the new symptoms will send for PT. She has left sided weakness and hemisensory loss from prior dural sinus thrombosis, worsening, may be recrudescence of symptoms as workup revealed no new issues. No other focal neurologic deficits, associated symptoms, inciting events or modifiable factors.  Reviewed notes, labs and imaging from outside physicians, which showed:  I reviewed MRI images of the brain February 2021 which were significant for partially empty sella which we can sometimes see in idiopathic intracranial hypertension.  I reviewed the reports of the MR angio of the head and neck which did not show any significant large or medium artery occlusions or stenosis, and MRV was normal.  I also reviewed labs.  Review of Systems: Patient complains of symptoms per HPI as well as the following symptoms: headache, hearing changes. Pertinent negatives and positives per HPI. All others negative.   Social History   Socioeconomic History  . Marital status: Married    Spouse name: Not on file  . Number of children: Not on file  . Years of education: Not on file  . Highest education level: Not on file  Occupational History  . Not on file  Tobacco Use  . Smoking status: Never Smoker  . Smokeless tobacco: Never Used  Substance and Sexual Activity  . Alcohol use: No  . Drug use:  No  . Sexual activity: Never    Birth control/protection: None  Other Topics Concern  . Not on file  Social History Narrative   Lives with husband    Right handed   Caffeine: 1 cup once a week. Update 08/20/2019 not drinking any right now.   Social Determinants of Health   Financial Resource Strain:   . Difficulty of Paying Living Expenses:   Food Insecurity:   . Worried About Charity fundraiser in the Last Year:   . Arboriculturist in the Last Year:   Transportation  Needs:   . Film/video editor (Medical):   Marland Kitchen Lack of Transportation (Non-Medical):   Physical Activity:   . Days of Exercise per Week:   . Minutes of Exercise per Session:   Stress:   . Feeling of Stress :   Social Connections:   . Frequency of Communication with Friends and Family:   . Frequency of Social Gatherings with Friends and Family:   . Attends Religious Services:   . Active Member of Clubs or Organizations:   . Attends Archivist Meetings:   Marland Kitchen Marital Status:   Intimate Partner Violence:   . Fear of Current or Ex-Partner:   . Emotionally Abused:   Marland Kitchen Physically Abused:   . Sexually Abused:     Family History  Problem Relation Age of Onset  . Obesity Mother   . Hyperlipidemia Father   . Breast cancer Other        Great Grandmother   . Breast cancer Other   . Headache Neg Hx   . Migraines Neg Hx     Past Medical History:  Diagnosis Date  . Anemia   . Bilateral swelling of feet   . BRCA1 negative 10/08/2013  . CVA (cerebral infarction) 2002   Most likely from Hormonal Tx  . 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 Gallbladder problem   . Gastric outlet obstruction   . Gastroparesis   . H/O blood clots   . Heartburn   . Hiatal hernia   . Hyperlipidemia   . Hypertension   . IIH (idiopathic intracranial hypertension)   . Inflammatory arthritis   . Joint pain   . Low vitamin D level    2013  17  . OSA (obstructive sleep apnea)   . PCOS (polycystic ovarian syndrome)    stroke on OCP hormonal therapy  . Prediabetes   . Pylorospasm   . Stroke (cerebrum) ([place])   . Vitamin D deficiency     Patient Active Problem List   Diagnosis Date Noted  . Esophageal hiatal hernia 06/20/2019  . Stroke ([place]) 06/20/2019  . 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 Myopia with astigmatism, bilateral 01/05/2019  . Posterior vitreous detachment of both  eyes 01/05/2019  . Blepharitis of both upper and lower eyelid 01/05/2019  . Family history of malignant hyperthermia 05/17/2016  . GERD (gastroesophageal reflux disease) 02/25/2016  . Chronic cholecystitis with calculus 10/30/2013  . Acute cholecystitis 10/10/2013  . Status post laparoscopic cholecystectomy June 2015 10/09/2013  . Obesity (BMI 30-39.9) 10/08/2013  . PCOS (polycystic ovarian syndrome)     Past Surgical History:  Procedure Laterality Date  . APPENDECTOMY    . CHOLECYSTECTOMY N/A 10/09/2013   Procedure: LAPAROSCOPIC CHOLECYSTECTOMY WITH INTRAOPERATIVE CHOLANGIOGRAM;  Surgeon: Pedro Earls, MD;  Location: [facility];  Service: General;  Laterality: N/A;  . COLON SURGERY    . KNEE ARTHROSCOPY Bilateral   . PYLOROPLASTY    . RIGHT OOPHORECTOMY    . SALPINGECTOMY Bilateral   . TYMPANOSTOMY TUBE PLACEMENT     x 2  . WISDOM TOOTH EXTRACTION      Current Outpatient Medications  Medication Sig Dispense Refill  . acetaminophen (TYLENOL) 325 MG tablet Take 650 mg by mouth every 6 (six) hours as needed for mild pain.     Marland Kitchen amitriptyline (ELAVIL) 25 MG tablet Take 25 mg by mouth at bedtime.    . famotidine (PEPCID) 40 MG tablet Take 40 mg by mouth 2 (two) times daily.    . Fremanezumab-vfrm (AJOVY) 225 MG/1.5ML SOAJ Inject 225 mg into the skin every 30 (thirty) days. 1.5 mL 5  . losartan (COZAAR) 50 MG tablet Take 0.5 tablets (25 mg total) by mouth daily. 30 tablet 0  . metFORMIN (GLUCOPHAGE) 500 MG tablet Take 500 mg by mouth 2 (two) times a day.    . Multiple Vitamins-Minerals (MULTIVITAMIN WITH MINERALS) tablet Take 1 tablet by mouth daily.    . ondansetron (ZOFRAN-ODT) 4 MG disintegrating tablet Take 4 mg by mouth every 4 (four) hours as needed for nausea or vomiting (DISSOLVE ORALLY).     Marland Kitchen prochlorperazine (COMPAZINE) 10 MG tablet Take 10 mg by mouth every 6 (six) hours as needed for nausea or vomiting.     . rizatriptan (MAXALT-MLT) 10 MG disintegrating tablet Take 1  tablet (10 mg total) by mouth as needed for migraine. May repeat in 2 hours if needed 9 tablet 11  . topiramate (TOPAMAX) 100 MG tablet Take 1 tablet (100 mg total) by mouth 2 (two) times daily. 180 tablet 3  . Ubrogepant (UBRELVY) 50 MG TABS Take 50 mg by mouth as needed. At the onset of migraine- can repeat in 2 hours if needed (Patient taking differently: Take 50 mg by mouth See admin instructions. Take 50 mg by mouth at onset of a migraine and may repeat once in 2 hours, if no relief- seek medical attention if still not resolution after 2nd dose) 10 tablet 5  . Vitamin D, Ergocalciferol, (DRISDOL) 1.25 MG (50000 UNIT) CAPS capsule Take 1 capsule (50,000 Units total) by mouth every 7 (seven) days. (Patient taking differently: Take 50,000 Units by mouth every Monday. ) 4 capsule 0   No current facility-administered medications for this visit.    Allergies as of 08/23/2019 - Review Complete 08/23/2019  Allergen Reaction Noted  . Cisapride Anaphylaxis 10/20/2017  . Clarithromycin Nausea And Vomiting and Other (See Comments) 08/01/2012  . Estrogens Other (See Comments) 01/27/2016  . Tape Rash and Other (See Comments) 12/10/2015  . Cholestyramine Nausea And Vomiting 01/28/2016  . Ciprofloxacin Hives and Rash 10/08/2013  . Keflex [cephalexin] Nausea And Vomiting and Other (See Comments) 10/08/2013  . Metronidazole Diarrhea and Nausea And Vomiting 01/28/2016  . Minocycline hcl Nausea And Vomiting and Other (See Comments) 01/28/2016  . Tetracyclines & related Nausea And Vomiting and Other (See Comments) 10/08/2013  . Unasyn [ampicillin-sulbactam sodium] Nausea And Vomiting 10/08/2013  . Ampicillin Diarrhea 06/19/2019  . Codeine Itching, Rash, and Other (See Comments) 08/01/2012  . Metoclopramide Other (See Comments) 10/08/2013    Vitals: BP (!) 122/96 (BP Location: Right Arm, Patient Position: Sitting)   Pulse 98   Temp (!) 97.5 F (36.4 C)   Ht '5\' 6"'  (1.676 m)   Wt 229 lb (103.9 kg)  Comment: weight  used from [facility] visit today  LMP 08/20/2019   SpO2 98%   BMI 36.96 kg/m  Last Weight:  Wt Readings from Last 1 Encounters:  08/23/19 229 lb (103.9 kg)   Last Height:   Ht Readings from Last 1 Encounters:  08/23/19 '5\' 6"'  (1.676 m)     Physical exam: Exam: Gen: appears slightly uncomfortable but states she is 8/10 headache No peripheral edema, warm, nontender Eyes: Conjunctivae clear without exudates or hemorrhage  Neuro: Detailed Neurologic Exam  Speech:    Speech is normal; fluent and spontaneous with normal comprehension.  Cognition:    The patient is oriented to person, place, and time;     recent and remote memory intact;     language fluent;     normal attention, concentration,     fund of knowledge Cranial Nerves:    The pupils are equal, round, and reactive to light. The fundi are flat. Visual fields are full to finger confrontation. Extraocular movements are intact. Trigeminal sensation is intact and the muscles of mastication are normal. The face is symmetric. The palate elevates in the midline. Hearing intact. Voice is normal. Shoulder shrug is normal. The tongue has normal motion without fasciculations.     Assessment/Plan:  44 year old with headaches, no history of migraines. She has multiple risk actors for IDIOPATHIC INTRACRANIAL HYPERTENSION including hx of dural venous sinus thrombosis, PCOS, IUD, recent weight gain, tetracycline in the past for 4 months.  She has left sided weakness and hemisensory loss from prior dural sinus thrombosis, worsening, may be recrudescence of symptoms as workup revealed no new issues(MRI/CTA/MRV). MRI showed partially empty sella.   - Opening pressure only 22, does not fit criteria for IDIOPATHIC INTRACRANIAL HYPERTENSION and the diamox is not helping whatsoever (on high dose). She recently went to the ED and was diagnosed with hemiplegic migraines(?). Very unclear what is going on here, we will refer to academic  center as patient has a complicated history and unusual presentation. - Patient is here today with an 8 out of 10 headache.  Her blood pressure is normal.  She looks slightly uncomfortable however bright light to examine her fundi did not cause any visible discomfort.  We will give her a migraine cocktail, will increase her Topamax to 100 mg twice daily, and will follow up on her referral to [facility]. - stop diamox(had side effects and 1g bid without improvement) increse topamax to 140m bid - continue ajovy - [facility] stated they wanted me to order overight O2 monitor? We will send this note to JCarlos Leveringand see why we have to order it and [facility] JCarlos LeveringNP [facility] - Try maxalt acutely with ondansetron (Roselyn Meiernot effective) - switch from Diamox to Topiramate, can try other medications and/or botox. - Referral to academic headache clinic - she is on a cpap machine and compliant - Referral to the [facility] for obesity: she is going - I called [facility] and we expedited Lumbar Puncture, scheduled in the morning, opening pressure only 22 [place] [facility] cervical myofascial pain syndrome - she went and is finishing up with them   No orders of the defined types were placed in this encounter.  Meds ordered this encounter  Medications  . topiramate (TOPAMAX) 100 MG tablet    Sig: Take 1 tablet (100 mg total) by mouth 2 (two)  times daily.    Dispense:  180 tablet    Refill:  3   Discussed: To prevent or relieve headaches, try the following: Cool Compress. Lie down and place a cool compress on your head.  Avoid headache triggers. If certain foods or odors seem to have triggered your migraines in the past, avoid them. A headache diary might help you identify triggers.  Include physical activity in your daily routine. Try a daily walk or other moderate  aerobic exercise.  Manage stress. Find healthy ways to cope with the stressors, such as delegating tasks on your to-do list.  Practice relaxation techniques. Try deep breathing, yoga, massage and visualization.  Eat regularly. Eating regularly scheduled meals and maintaining a healthy diet might help prevent headaches. Also, drink plenty of fluids.  Follow a regular sleep schedule. Sleep deprivation might contribute to headaches Consider biofeedback. With this mind-body technique, you learn to control certain bodily functions -- such as muscle tension, heart rate and blood pressure -- to prevent headaches or reduce headache pain.    Proceed to emergency room if you experience new or worsening symptoms or symptoms do not resolve, if you have new neurologic symptoms or if headache is severe, or for any concerning symptom.   Provided education and documentation from American headache Society toolbox including articles on: chronic migraine medication overuse headache, chronic migraines, prevention of migraines, behavioral and other nonpharmacologic treatments for headache.   Cc: [name], MD,  Carlos Levering NP [place], [facility] [address] [place], [place] [address]  Phone [phone] Fax [phone]  I spent 93mnutes of face-to-face and non-face-to-face time with patient on the  1. Acute intractable headache, unspecified headache type    diagnosis.  This included previsit chart review, lab review, study review, order entry, electronic health record documentation, patient education on the different diagnostic and therapeutic options, counseling and coordination of care, risks and benefits of management, compliance, or risk factor reduction

## 2019-08-23 NOTE — Progress Notes (Signed)
Chief Complaint:   OBESITY Shelley Ryan is here to discuss her progress with her obesity treatment plan along with follow-up of her obesity related diagnoses. Shelley Ryan is on the Category 2 Plan and states she is following her eating plan approximately 85% of the time. Shelley Ryan states she is exercising 0 minutes 0 times per week.  Today's visit was #: 4 Starting weight: 242 lbs Starting date: 07/02/2019 Today's weight: 229 lbs Today's date: 08/23/2019 Total lbs lost to date: 13 Total lbs lost since last in-office visit: 3  Interim History: Shelley Ryan voices she has been having a significant headache and nausea, especially over the past few days. She has not been able to follow the plan due to nausea; is able to tolerate eggs but not much else. She is going to a neurologist after this appointment. She is looking for other fruit options.  Subjective:   Essential hypertension. Blood pressure is well controlled. No chest pain, chest pressure, or headache. No dizziness or lightheadedness. Shelley Ryan is on losartan.  BP Readings from Last 3 Encounters:  08/23/19 (!) 122/96  08/23/19 114/70  08/20/19 125/82   Lab Results  Component Value Date   CREATININE 0.90 08/08/2019   CREATININE 0.96 08/08/2019   CREATININE 1.10 (H) 07/29/2019   Vitamin D deficiency. No nausea, vomiting, or muscle weakness. Shelley Ryan endorses fatigue. She is on prescription Vitamin D. Last Vitamin D 22.0 on 06/19/2019.  Assessment/Plan:   Essential hypertension. Shelley Ryan is working on healthy weight loss and exercise to improve blood pressure control. We will watch for signs of hypotension as she continues her lifestyle modifications. She will continue her current medication (no refill needed). She is to decrease her blood pressure medication over the next 2 weeks.  Vitamin D deficiency. Low Vitamin D level contributes to fatigue and are associated with obesity, breast, and colon cancer. She agrees to continue to take  prescription Vitamin D (no refill needed) and will follow-up for routine testing of Vitamin D, at least 2-3 times per year to avoid over-replacement.  Class 2 severe obesity with serious comorbidity and body mass index (BMI) of 35.0 to 35.9 in adult, unspecified obesity type (HCC).  Shelley Ryan is currently in the action stage of change. As such, her goal is to continue with weight loss efforts. She has agreed to the Category 2 Plan.   Exercise goals: For substantial health benefits, adults should do at least 150 minutes (2 hours and 30 minutes) a week of moderate-intensity, or 75 minutes (1 hour and 15 minutes) a week of vigorous-intensity aerobic physical activity, or an equivalent combination of moderate- and vigorous-intensity aerobic activity. Aerobic activity should be performed in episodes of at least 10 minutes, and preferably, it should be spread throughout the week.  Behavioral modification strategies: increasing lean protein intake, increasing vegetables, meal planning and cooking strategies, keeping healthy foods in the home and planning for success.  Shelley Ryan has agreed to follow-up with our clinic in 2 weeks. She was informed of the importance of frequent follow-up visits to maximize her success with intensive lifestyle modifications for her multiple health conditions.   Objective:   Blood pressure 114/70, pulse 95, temperature 98 F (36.7 C), temperature source Oral, height 5\' 7"  (1.702 m), weight 229 lb (103.9 kg), last menstrual period 08/20/2019, SpO2 98 %. Body mass index is 35.87 kg/m.  General: Cooperative, alert, well developed, in no acute distress. HEENT: Conjunctivae and lids unremarkable. Cardiovascular: Regular rhythm.  Lungs: Normal work of breathing. Neurologic: No  focal deficits.   Lab Results  Component Value Date   CREATININE 0.90 08/08/2019   BUN 18 08/08/2019   NA 140 08/08/2019   K 3.6 08/08/2019   CL 110 08/08/2019   CO2 17 (L) 08/08/2019   Lab Results    Component Value Date   ALT 33 08/08/2019   AST 20 08/08/2019   ALKPHOS 67 08/08/2019   BILITOT 0.7 08/08/2019   Lab Results  Component Value Date   HGBA1C 6.1 (H) 06/19/2019   Lab Results  Component Value Date   INSULIN 13.7 07/02/2019   Lab Results  Component Value Date   TSH 3.060 07/02/2019   Lab Results  Component Value Date   CHOL 201 (H) 07/02/2019   HDL 48 07/02/2019   LDLCALC 135 (H) 07/02/2019   TRIG 102 07/02/2019   Lab Results  Component Value Date   WBC 12.6 (H) 08/08/2019   HGB 14.6 08/08/2019   HCT 43.0 08/08/2019   MCV 92.3 08/08/2019   PLT 255 08/08/2019   No results found for: IRON, TIBC, FERRITIN  Attestation Statements:   Reviewed by clinician on day of visit: allergies, medications, problem list, medical history, surgical history, family history, social history, and previous encounter notes.  Time spent on visit including pre-visit chart review and post-visit charting and care was 16 minutes.   I, Michaelene Song, am acting as transcriptionist for Coralie Common, MD   I have reviewed the above documentation for accuracy and completeness, and I agree with the above. - Ilene Qua, MD

## 2019-08-27 ENCOUNTER — Other Ambulatory Visit: Payer: Self-pay | Admitting: *Deleted

## 2019-08-27 DIAGNOSIS — R519 Headache, unspecified: Secondary | ICD-10-CM

## 2019-08-27 NOTE — Telephone Encounter (Signed)
Spoke with Dr. Lucia Gaskins. [facility] referral was already sent. Per v.o. Dr. Lucia Gaskins I placed referral to [facility] today for intractable headache.

## 2019-08-28 ENCOUNTER — Emergency Department (HOSPITAL_COMMUNITY): Payer: BC Managed Care – PPO

## 2019-08-28 ENCOUNTER — Telehealth: Payer: Self-pay | Admitting: Neurology

## 2019-08-28 ENCOUNTER — Encounter (HOSPITAL_COMMUNITY): Payer: Self-pay | Admitting: Emergency Medicine

## 2019-08-28 ENCOUNTER — Other Ambulatory Visit: Payer: Self-pay

## 2019-08-28 ENCOUNTER — Inpatient Hospital Stay (HOSPITAL_COMMUNITY)
Admission: EM | Admit: 2019-08-28 | Discharge: 2019-08-30 | DRG: 103 | Disposition: A | Discharge status: Home / Self Care | Payer: BC Managed Care – PPO | Attending: Internal Medicine | Admitting: Internal Medicine

## 2019-08-28 ENCOUNTER — Encounter (INDEPENDENT_AMBULATORY_CARE_PROVIDER_SITE_OTHER): Payer: Self-pay

## 2019-08-28 DIAGNOSIS — G43101 Migraine with aura, not intractable, with status migrainosus: Principal | ICD-10-CM | POA: Diagnosis present

## 2019-08-28 DIAGNOSIS — R112 Nausea with vomiting, unspecified: Secondary | ICD-10-CM

## 2019-08-28 DIAGNOSIS — Z79899 Other long term (current) drug therapy: Secondary | ICD-10-CM

## 2019-08-28 DIAGNOSIS — G4733 Obstructive sleep apnea (adult) (pediatric): Secondary | ICD-10-CM | POA: Diagnosis present

## 2019-08-28 DIAGNOSIS — Z7984 Long term (current) use of oral hypoglycemic drugs: Secondary | ICD-10-CM

## 2019-08-28 DIAGNOSIS — R202 Paresthesia of skin: Secondary | ICD-10-CM

## 2019-08-28 DIAGNOSIS — R4701 Aphasia: Secondary | ICD-10-CM | POA: Diagnosis present

## 2019-08-28 DIAGNOSIS — K219 Gastro-esophageal reflux disease without esophagitis: Secondary | ICD-10-CM | POA: Diagnosis present

## 2019-08-28 DIAGNOSIS — E785 Hyperlipidemia, unspecified: Secondary | ICD-10-CM | POA: Diagnosis present

## 2019-08-28 DIAGNOSIS — Z20822 Contact with and (suspected) exposure to covid-19: Secondary | ICD-10-CM | POA: Diagnosis present

## 2019-08-28 DIAGNOSIS — R519 Headache, unspecified: Secondary | ICD-10-CM

## 2019-08-28 DIAGNOSIS — R200 Anesthesia of skin: Secondary | ICD-10-CM

## 2019-08-28 DIAGNOSIS — K3184 Gastroparesis: Secondary | ICD-10-CM | POA: Diagnosis present

## 2019-08-28 DIAGNOSIS — I10 Essential (primary) hypertension: Secondary | ICD-10-CM | POA: Diagnosis present

## 2019-08-28 DIAGNOSIS — E282 Polycystic ovarian syndrome: Secondary | ICD-10-CM | POA: Diagnosis present

## 2019-08-28 DIAGNOSIS — R7303 Prediabetes: Secondary | ICD-10-CM | POA: Diagnosis present

## 2019-08-28 DIAGNOSIS — H532 Diplopia: Secondary | ICD-10-CM | POA: Diagnosis present

## 2019-08-28 DIAGNOSIS — R42 Dizziness and giddiness: Secondary | ICD-10-CM | POA: Diagnosis not present

## 2019-08-28 DIAGNOSIS — R29898 Other symptoms and signs involving the musculoskeletal system: Secondary | ICD-10-CM

## 2019-08-28 DIAGNOSIS — H539 Unspecified visual disturbance: Secondary | ICD-10-CM

## 2019-08-28 DIAGNOSIS — G8194 Hemiplegia, unspecified affecting left nondominant side: Secondary | ICD-10-CM | POA: Diagnosis present

## 2019-08-28 DIAGNOSIS — Z8673 Personal history of transient ischemic attack (TIA), and cerebral infarction without residual deficits: Secondary | ICD-10-CM

## 2019-08-28 LAB — CBC
HCT: 46.7 % — ABNORMAL HIGH (ref 36.0–46.0)
Hemoglobin: 15.4 g/dL — ABNORMAL HIGH (ref 12.0–15.0)
MCH: 30 pg (ref 26.0–34.0)
MCHC: 33 g/dL (ref 30.0–36.0)
MCV: 90.9 fL (ref 80.0–100.0)
Platelets: 259 10*3/uL (ref 150–400)
RBC: 5.14 MIL/uL — ABNORMAL HIGH (ref 3.87–5.11)
RDW: 13.6 % (ref 11.5–15.5)
WBC: 15.3 10*3/uL — ABNORMAL HIGH (ref 4.0–10.5)
nRBC: 0 % (ref 0.0–0.2)

## 2019-08-28 LAB — I-STAT BETA HCG BLOOD, ED (MC, WL, AP ONLY): I-stat hCG, quantitative: <5 m[IU]/mL (ref <5)

## 2019-08-28 LAB — DIFFERENTIAL
Abs Immature Granulocytes: 0.06 10*3/uL (ref 0.00–0.07)
Basophils Absolute: 0.1 10*3/uL (ref 0.0–0.1)
Basophils Relative: 0 %
Eosinophils Absolute: 0.1 10*3/uL (ref 0.0–0.5)
Eosinophils Relative: 1 %
Immature Granulocytes: 0 %
Lymphocytes Relative: 13 %
Lymphs Abs: 2.1 10*3/uL (ref 0.7–4.0)
Monocytes Absolute: 0.7 10*3/uL (ref 0.1–1.0)
Monocytes Relative: 5 %
Neutro Abs: 12.3 10*3/uL — ABNORMAL HIGH (ref 1.7–7.7)
Neutrophils Relative %: 81 %

## 2019-08-28 LAB — COMPREHENSIVE METABOLIC PANEL
ALT: 39 U/L (ref 0–44)
AST: 26 U/L (ref 15–41)
Albumin: 4.2 g/dL (ref 3.5–5.0)
Alkaline Phosphatase: 67 U/L (ref 38–126)
Anion gap: 11 (ref 5–15)
BUN: 13 mg/dL (ref 6–20)
CO2: 18 mmol/L — ABNORMAL LOW (ref 22–32)
Calcium: 9.3 mg/dL (ref 8.9–10.3)
Chloride: 110 mmol/L (ref 98–111)
Creatinine, Ser: 1.1 mg/dL — ABNORMAL HIGH (ref 0.44–1.00)
GFR calc Af Amer: >60 mL/min (ref >60)
GFR calc non Af Amer: >60 mL/min (ref >60)
Glucose, Bld: 113 mg/dL — ABNORMAL HIGH (ref 70–99)
Potassium: 3.8 mmol/L (ref 3.5–5.1)
Sodium: 139 mmol/L (ref 135–145)
Total Bilirubin: 1.1 mg/dL (ref 0.3–1.2)
Total Protein: 7.6 g/dL (ref 6.5–8.1)

## 2019-08-28 LAB — APTT: aPTT: 31 seconds (ref 24–36)

## 2019-08-28 LAB — I-STAT CHEM 8, ED
BUN: 16 mg/dL (ref 6–20)
Calcium, Ion: 1.11 mmol/L — ABNORMAL LOW (ref 1.15–1.40)
Chloride: 109 mmol/L (ref 98–111)
Creatinine, Ser: 0.9 mg/dL (ref 0.44–1.00)
Glucose, Bld: 113 mg/dL — ABNORMAL HIGH (ref 70–99)
HCT: 47 % — ABNORMAL HIGH (ref 36.0–46.0)
Hemoglobin: 16 g/dL — ABNORMAL HIGH (ref 12.0–15.0)
Potassium: 3.8 mmol/L (ref 3.5–5.1)
Sodium: 140 mmol/L (ref 135–145)
TCO2: 19 mmol/L — ABNORMAL LOW (ref 22–32)

## 2019-08-28 LAB — PROTIME-INR
INR: 1 (ref 0.8–1.2)
Prothrombin Time: 12.6 seconds (ref 11.4–15.2)

## 2019-08-28 MED ORDER — GADOBUTROL 1 MMOL/ML IV SOLN
10.0000 mL | Freq: Once | INTRAVENOUS | Status: AC | PRN
  Administered 2019-08-28: 20:00:00 10 mL via INTRAVENOUS

## 2019-08-28 MED ORDER — IOHEXOL 350 MG/ML SOLN
75.0000 mL | Freq: Once | INTRAVENOUS | Status: AC | PRN
  Administered 2019-08-28: 75 mL via INTRAVENOUS

## 2019-08-28 MED ORDER — SODIUM CHLORIDE 0.9 % IV BOLUS
1000.0000 mL | Freq: Once | INTRAVENOUS | Status: AC
  Administered 2019-08-28: 1000 mL via INTRAVENOUS

## 2019-08-28 MED ORDER — SODIUM CHLORIDE 0.9% FLUSH: 3.0000 mL | Freq: Once | INTRAVENOUS | Status: DC

## 2019-08-28 MED ORDER — PROCHLORPERAZINE EDISYLATE 10 MG/2ML IJ SOLN
10.0000 mg | Freq: Once | INTRAMUSCULAR | Status: AC
  Administered 2019-08-28: 10 mg via INTRAVENOUS
  Filled 2019-08-28: qty 2

## 2019-08-28 MED ORDER — DIPHENHYDRAMINE HCL 50 MG/ML IJ SOLN
25.0000 mg | Freq: Once | INTRAMUSCULAR | Status: AC
  Administered 2019-08-28: 25 mg via INTRAVENOUS
  Filled 2019-08-28: qty 1

## 2019-08-28 NOTE — Telephone Encounter (Signed)
Just get her the first appointment available please thanks

## 2019-08-28 NOTE — Telephone Encounter (Signed)
I spoke to patient, she is on her way to the ED. My policy is to give my headache patients 2-3 days a month of FMLA. But it sounds like she would like more. I would discuss and see if pcp will give her what she needs.My policy is as above and happy to do that otherwise pcp. thanks

## 2019-08-28 NOTE — Telephone Encounter (Signed)
Patient called back in regards to missed call  

## 2019-08-28 NOTE — Telephone Encounter (Signed)
I called the pt and we discussed Dr. Trevor Mace recommendation. Pt said she would probably head over to the ED. I told her we would call ahead to notify. She is concerned about a blind LP as she has scoliosis and when she was younger an LP was attempted x 4. I let her know the hospital can do these under fluoro. Pt reported the Zofran is not helping. She takes it every 4-6 hours. Pt reports pain 4/10 in AM, increasing throughout day and by night it's 8-19/10. Has pulsatile tinnitus and high frequency tinnitus when she lies down. She also asked if Dr. 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 I let her know  I would ask Dr. Lucia Gaskins. Pt verbalized appreciation.   I called the ER and notified them of pt's likely arrival and that Dr. Lucia Gaskins is suggesting neuro consult and possible repeat LP. I also spoke with Dr. Lucia Gaskins. She advises pt request FMLA from PCP.

## 2019-08-28 NOTE — ED Provider Notes (Signed)
[facility] EMERGENCY DEPARTMENT Provider Note   CSN: [id number] Arrival date & time: 08/28/19  1323     History Chief Complaint  Patient presents with  . Headache  . Tinnitus  . Nausea    Shelley Ryan is a 44 y.o. female.  The history is provided by the patient and medical records. No language interpreter was used.  Headache Pain location:  Generalized Quality:  Dull Severity currently:  10/10 Severity at highest:  10/10 Onset quality:  Gradual Timing:  Constant Progression:  Waxing and waning Chronicity:  Recurrent Similar to prior headaches: yes   Context: bright light and loud noise   Relieved by:  Nothing Worsened by:  Nothing Ineffective treatments:  None tried Associated symptoms: dizziness, fatigue, loss of balance, nausea, numbness, photophobia, tingling, visual change and vomiting   Associated symptoms: no abdominal pain, no back pain, no congestion, no cough, no diarrhea, no drainage, no eye pain, no fever, no focal weakness, no neck pain, no neck stiffness, no seizures and no weakness        Past Medical History:  Diagnosis Date  . Anemia   . Bilateral swelling of feet   . BRCA1 negative 10/08/2013  . CVA (cerebral infarction) 2002   Most likely from Hormonal Tx  . 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 Gallbladder problem   . Gastric outlet obstruction   . Gastroparesis   . H/O blood clots   . Heartburn   . Hiatal hernia   . Hyperlipidemia   . Hypertension   . IIH (idiopathic intracranial hypertension)   . Inflammatory arthritis   . Joint pain   . Low vitamin D level    2013  17  . OSA (obstructive sleep apnea)   . PCOS (polycystic ovarian syndrome)    stroke on OCP hormonal therapy  . Prediabetes   . Pylorospasm   . Stroke (cerebrum) ([place])   . Vitamin D deficiency     Patient Active Problem List   Diagnosis Date Noted  . Esophageal hiatal hernia 06/20/2019  .  Stroke ([place]) 06/20/2019  . 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 Myopia with astigmatism, bilateral 01/05/2019  . Posterior vitreous detachment of both eyes 01/05/2019  . Blepharitis of both upper and lower eyelid 01/05/2019  . Family history of malignant hyperthermia 05/17/2016  . GERD (gastroesophageal reflux disease) 02/25/2016  . Chronic cholecystitis with calculus 10/30/2013  . Acute cholecystitis 10/10/2013  . Status post laparoscopic cholecystectomy June 2015 10/09/2013  . Obesity (BMI 30-39.9) 10/08/2013  . PCOS (polycystic ovarian syndrome)     Past Surgical History:  Procedure Laterality Date  . APPENDECTOMY    . CHOLECYSTECTOMY N/A 10/09/2013   Procedure: LAPAROSCOPIC CHOLECYSTECTOMY WITH INTRAOPERATIVE CHOLANGIOGRAM;  Surgeon: Pedro Earls, MD;  Location: [facility];  Service: General;  Laterality: N/A;  . COLON SURGERY    . KNEE ARTHROSCOPY Bilateral   . PYLOROPLASTY    . RIGHT OOPHORECTOMY    . SALPINGECTOMY Bilateral   . TYMPANOSTOMY TUBE PLACEMENT     x 2  . WISDOM TOOTH EXTRACTION       OB History    Gravida  1   Para      Term      Preterm      AB      Living  SAB      TAB      Ectopic      Multiple      Live Births              Family History  Problem Relation Age of Onset  . Obesity Mother   . Hyperlipidemia Father   . Breast cancer Other        Great Grandmother   . Breast cancer Other   . Headache Neg Hx   . Migraines Neg Hx     Social History   Tobacco Use  . Smoking status: Never Smoker  . Smokeless tobacco: Never Used  Substance Use Topics  . Alcohol use: No  . Drug use: No    Home Medications Prior to Admission medications   Medication Sig Start Date End Date Taking? Authorizing Provider  acetaminophen (TYLENOL) 325 MG tablet Take 650 mg by mouth every 6 (six) hours as needed for mild pain.    Yes [provider]  amitriptyline  (ELAVIL) 25 MG tablet Take 25 mg by mouth at bedtime. 08/09/18  Yes [provider]  famotidine (PEPCID) 40 MG tablet Take 40 mg by mouth 2 (two) times daily.   Yes [provider]  Fremanezumab-vfrm (AJOVY) 225 MG/1.5ML SOAJ Inject 225 mg into the skin every 30 (thirty) days. 07/31/19  Yes Ward Givens, NP  hydroxypropyl methylcellulose / hypromellose (ISOPTO TEARS / GONIOVISC) 2.5 % ophthalmic solution Place 1 drop into both eyes daily as needed for dry eyes.   Yes [provider]  losartan (COZAAR) 50 MG tablet Take 0.5 tablets (25 mg total) by mouth daily. Patient taking differently: Take 50 mg by mouth daily.  08/07/19  Yes Eber Jones, MD  metFORMIN (GLUCOPHAGE) 500 MG tablet Take 500 mg by mouth 2 (two) times a day. 07/10/18  Yes [provider]  Multiple Vitamins-Minerals (MULTIVITAMIN WITH MINERALS) tablet Take 1 tablet by mouth daily.   Yes [provider]  ondansetron (ZOFRAN-ODT) 4 MG disintegrating tablet Take 4 mg by mouth every 4 (four) hours as needed for nausea or vomiting (DISSOLVE ORALLY).    Yes [provider]  rizatriptan (MAXALT-MLT) 10 MG disintegrating tablet Take 1 tablet (10 mg total) by mouth as needed for migraine. May repeat in 2 hours if needed 08/20/19  Yes Melvenia Beam, MD  topiramate (TOPAMAX) 100 MG tablet Take 1 tablet (100 mg total) by mouth 2 (two) times daily. 08/23/19  Yes Melvenia Beam, MD  Ubrogepant (UBRELVY) 50 MG TABS Take 50 mg by mouth as needed. At the onset of migraine- can repeat in 2 hours if needed Patient taking differently: Take 50 mg by mouth See admin instructions. Take 50 mg by mouth at onset of a migraine and may repeat once in 2 hours. 07/31/19  Yes Ward Givens, NP  Vitamin D, Ergocalciferol, (DRISDOL) 1.25 MG (50000 UNIT) CAPS capsule Take 1 capsule (50,000 Units total) by mouth every 7 (seven) days. Patient taking differently: Take 50,000 Units by mouth every Monday.   08/07/19  Yes Eber Jones, MD    Allergies    Cisapride, Clarithromycin, Estrogens, Tape, Cholestyramine, Ciprofloxacin, Keflex [cephalexin], Metronidazole, Minocycline hcl, Tetracyclines & related, Unasyn [ampicillin-sulbactam sodium], Ampicillin, Codeine, and Metoclopramide  Review of Systems   Review of Systems  Constitutional: Positive for fatigue. Negative for chills, diaphoresis and fever.  HENT: Negative for congestion and postnasal drip.   Eyes: Positive for photophobia and visual disturbance. Negative for pain.  Respiratory: Negative  for cough, chest tightness, shortness of breath and wheezing.   Cardiovascular: Negative for chest pain, palpitations and leg swelling.  Gastrointestinal: Positive for nausea and vomiting. Negative for abdominal pain, constipation and diarrhea.  Genitourinary: Negative for dysuria, flank pain and frequency.  Musculoskeletal: Negative for back pain, neck pain and neck stiffness.  Neurological: Positive for dizziness, light-headedness, numbness, headaches and loss of balance. Negative for focal weakness, seizures, syncope, speech difficulty and weakness.  Psychiatric/Behavioral: Negative for agitation.  All other systems reviewed and are negative.   Physical Exam Updated Vital Signs BP 118/84 (BP Location: Right Arm)   Pulse (!) 116   Temp 98.4 F (36.9 C) (Oral)   Resp 20   Ht '5\' 7"'  (1.702 m)   Wt 103.9 kg   LMP 08/20/2019   SpO2 98%   BMI 35.87 kg/m   Physical Exam Vitals and nursing note reviewed.  Constitutional:      General: She is not in acute distress.    Appearance: She is well-developed. She is not ill-appearing, toxic-appearing or diaphoretic.  HENT:     Head: Normocephalic and atraumatic.     Mouth/Throat:     Mouth: Mucous membranes are moist.  Eyes:     General: No scleral icterus.    Extraocular Movements: Extraocular movements intact.     Right eye: Normal extraocular motion.     Left eye: Normal extraocular  motion.     Conjunctiva/sclera: Conjunctivae normal.     Pupils: Pupils are equal, round, and reactive to light. Pupils are equal.  Cardiovascular:     Rate and Rhythm: Normal rate and regular rhythm.     Heart sounds: No murmur.  Pulmonary:     Effort: Pulmonary effort is normal. No respiratory distress.     Breath sounds: Normal breath sounds. No wheezing, rhonchi or rales.  Chest:     Chest wall: No tenderness.  Abdominal:     Palpations: Abdomen is soft.     Tenderness: There is no abdominal tenderness.  Musculoskeletal:        General: No swelling or tenderness.     Cervical back: Normal range of motion and neck supple.  Skin:    General: Skin is warm and dry.     Capillary Refill: Capillary refill takes less than 2 seconds.     Coloration: Skin is not cyanotic or pale.     Findings: No erythema or rash.  Neurological:     Mental Status: She is alert and oriented to person, place, and time.     GCS: GCS eye subscore is 4. GCS verbal subscore is 5. GCS motor subscore is 6.     Cranial Nerves: No cranial nerve deficit, dysarthria or facial asymmetry.     Sensory: No sensory deficit.     Motor: No weakness.  Psychiatric:        Mood and Affect: Mood normal.        Behavior: Behavior is not agitated.     ED Results / Procedures / Treatments   Labs (all labs ordered are listed, but only abnormal results are displayed) Labs Reviewed  CBC - Abnormal; Notable for the following components:      Result Value   WBC 15.3 (*)    RBC 5.14 (*)    Hemoglobin 15.4 (*)    HCT 46.7 (*)    All other components within normal limits  DIFFERENTIAL - Abnormal; Notable for the following components:   Neutro Abs 12.3 (*)  All other components within normal limits  COMPREHENSIVE METABOLIC PANEL - Abnormal; Notable for the following components:   CO2 18 (*)    Glucose, Bld 113 (*)    Creatinine, Ser 1.10 (*)    All other components within normal limits  I-STAT CHEM 8, ED - Abnormal;  Notable for the following components:   Glucose, Bld 113 (*)    Calcium, Ion 1.11 (*)    TCO2 19 (*)    Hemoglobin 16.0 (*)    HCT 47.0 (*)    All other components within normal limits  CSF CULTURE  GRAM STAIN  RESPIRATORY PANEL BY RT PCR (FLU A&B, COVID)  PROTIME-INR  APTT  CSF CELL COUNT WITH DIFFERENTIAL  CSF CELL COUNT WITH DIFFERENTIAL  GLUCOSE, CSF  PROTEIN, CSF  CRYPTOCOCCAL ANTIGEN, CSF  CSF IGG  CBG MONITORING, ED  I-STAT BETA HCG BLOOD, ED ([facility], [facility], [facility] ONLY)    EKG EKG Interpretation  Date/Time:  Tuesday Aug 28 2019 13:49:17 EDT Ventricular Rate:  132 PR Interval:  146 QRS Duration: 78 QT Interval:  280 QTC Calculation: 414 R Axis:   82 Text Interpretation: Sinus tachycardia Cannot rule out Inferior infarct , age undetermined Anterior infarct , age undetermined Abnormal ECG When compared to prior, faster rate. No STEMI Confirmed by Antony Blackbird [phone]) on 08/28/2019 3:53:12 PM   Radiology MR Brain W and Wo Contrast  Result Date: 08/28/2019 CLINICAL DATA:  Headache. Tinnitus dizziness vision changes vomiting and left hand tingling. EXAM: MRI HEAD WITHOUT AND WITH CONTRAST TECHNIQUE: Multiplanar, multiecho pulse sequences of the brain and surrounding structures were obtained without and with intravenous contrast. CONTRAST:  19m GADAVIST GADOBUTROL 1 MMOL/ML IV SOLN COMPARISON:  MRI head 08/08/2019 FINDINGS: Brain: Negative for acute infarct. Scattered small deep white matter hyperintensities bilaterally. Brainstem and cerebellum normal. Negative for hemorrhage or mass. Normal enhancement postcontrast administration. Ventricle size is normal. Vascular: Normal arterial flow voids Skull and upper cervical spine: Negative Sinuses/Orbits: Negative Other: None IMPRESSION: No acute abnormality. Scattered small deep white matter hyperintensities bilaterally. Findings are most consistent with chronic microvascular ischemia or complex migraine headaches. No change from the recent  study. Electronically Signed   By: CFranchot GalloM.D.   On: 08/28/2019 20:23   CT VENOGRAM HEAD  Result Date: 08/28/2019 CLINICAL DATA:  Initial evaluation for persistent headache with nausea, vomiting, visual changes, tinnitus. EXAM: CT VENOGRAM HEAD TECHNIQUE: Dedicated CT venogram images of the head was performed using the standard protocol. CONTRAST:  71mOMNIPAQUE IOHEXOL 350 MG/ML SOLN COMPARISON:  Concomitant MRI of the brain performed on the same day. FINDINGS: Brain: Cerebral volume within normal limits for patient age. Few scattered hypodensities involving the supratentorial cerebral white matter noted, nonspecific, better evaluated on prior brain MRI. No evidence for acute intracranial hemorrhage. No findings to suggest acute large vessel territory infarct. No mass lesion, midline shift, or mass effect. Ventricles are normal in size without evidence for hydrocephalus. No extra-axial fluid collection identified. Vascular: No hyperdense vessel identified on precontrast imaging. Following contrast administration, normal opacification of the superior sagittal sinus to the level of the torcula is seen. Torcula itself is patent. Transverse and sigmoid sinuses are patent as are the partially visualized proximal internal jugular veins. Straight sinus, vein of Galen, internal cerebral veins, and basal veins of Rosenthal patent. No evidence for dural sinus thrombosis. No appreciable dural sinus stenosis. Skull: Scalp soft tissues demonstrate no acute abnormality. Calvarium intact. Sinuses/Orbits: Globes and orbital soft tissues within normal limits. Visualized paranasal  sinuses are clear. No mastoid effusion. IMPRESSION: Negative CT venogram. No evidence for dural sinus thrombosis or other abnormality. Electronically Signed   By: Jeannine Boga M.D.   On: 08/28/2019 22:47    Procedures Procedures (including critical care time)  Medications Ordered in ED Medications  sodium chloride flush (NS) 0.9  % injection 3 mL (has no administration in time range)  sodium chloride 0.9 % bolus 1,000 mL (0 mLs Intravenous Stopped 08/28/19 2209)  prochlorperazine (COMPAZINE) injection 10 mg (10 mg Intravenous Given 08/28/19 1937)  diphenhydrAMINE (BENADRYL) injection 25 mg (25 mg Intravenous Given 08/28/19 1937)  gadobutrol (GADAVIST) 1 MMOL/ML injection 10 mL (10 mLs Intravenous Contrast Given 08/28/19 2005)  iohexol (OMNIPAQUE) 350 MG/ML injection 75 mL (75 mLs Intravenous Contrast Given 08/28/19 2124)    ED Course  I have reviewed the triage vital signs and the nursing notes.  Pertinent labs & imaging results that were available during my care of the patient were reviewed by me and considered in my medical decision making (see chart for details).    MDM Rules/Calculators/A&P                      Gloriann Riede is a 44 y.o. female with a complicated past medical history significant for PCOS, GERD, esophageal hiatal hernia, gastroparesis, prior stroke, dural venous sinus thrombosis, and possible idiopathic intracranial hypertension who presents at the direction of her neurologist for further work-up of her headaches with associated symptoms of transient dizziness, transient vision changes, transient left arm symptoms, tinnitus, and uncontrolled nausea and vomiting.  Patient reports that all of this started back in February when she first had significant headaches.  The patient says that she has had extensive work-up including a lumbar puncture that although improved her symptoms for several days, did not completely alleviate her symptoms and her opening pressure was only 22.  Review of the patient's neurology note from the last few days show that her neurologist has lower suspicion for idiopathic intracranial hypertension and is more concerned about other etiologies.  Her neurologist has tried headache cocktails with minimal relief.  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  She reports that she has not been able tolerate any p.o. for the last 2 days and is having constant nausea and vomiting.  She is tachycardic on arrival between the 1 teens to 130s.  She currently describes her headache as moderate to severe and is 7 out of 10.  She reports is waxing and waning but is failing all outpatient management.  Her neurologist told her to come to the emergency department for further work-up including MRI brain with and without, CTV given her history of thrombosis, and lumbar puncture with opening pressure to either confirm or deny I IH as well as get the CSF labs collected.  On my exam, patient is very pleasant but in clear discomfort.  Patient's lungs are clear and chest is nontender.  Abdomen is nontender.  Normal range of motion of neck with no significant stiffness or tenderness.  Symmetric smile with normal sensation in the face.  Normal grip strength and sensation in extremities.  She is not having any visual complaints at this time.  She reports is still feeling somewhat dizzy intermittently.   Spoke with neurology to discuss management.  Neurology spoke with Dr. Lavell Anchors earlier and agrees with MRI with and without, CTV, basic labs, as well as LP with  the opening pressure and CSF studies.  Patient reports that she has thoracolumbar scoliosis and is a very difficult LP.  She says that the last time she had to have it done, they had to stick for 5 times and this caused her severe pain and distress.  She was told that she would likely be able to have fluoroscopy guided LP if she came to the hospital today.  I spoke with neurology who agrees that it is worthwhile speaking with the fluoroscopy team to see if they be able to do it under guided LP today given her significant preference to do so as well as her anatomic differences.  Will call fluoroscopy team and put the orders in.  We will give her headache cocktail initially to try and help her  symptoms and she reports that Compazine and Benadryl and fluids has helped in the past.  Neurology says that if the work-up is reassuring, she may need admission to medicine for DHE therapy.  5:30 PM Joe spoke with radiology who feels they will be able to do a fluoroscopy guided lumbar puncture for her.  They suspect it will be able to be done in the morning after her neuroimaging is completed and when they have the opportunity to do so.  We do not suspect infection as the cause of her symptoms at this time.  Anticipate she will need admission during this work-up.  11:22 PM MRI shows no evidence of stroke but potentially chronic microvascular changes versus atypical migraine findings.  CTV shows no thrombosis.  Patient will be admitted for further headache management and so that she can get lumbar puncture in the morning under fluoroscopy.  She will then be able to see the day neurology team who will be able to give recommendations in regards to possible DHE treatment.  The overnight neurology team reports that that is not something he typically does.  He agrees with patient being admitted and the morning team being able to discuss further headache management.  Patient is agreeable to this plan.   Final Clinical Impression(s) / ED Diagnoses Final diagnoses:  Acute intractable headache, unspecified headache type  Dizziness  Numbness and tingling in left hand  Transient vision disturbance  Intractable vomiting with nausea, unspecified vomiting type     Clinical Impression: 1. Acute intractable headache, unspecified headache type   2. Dizziness   3. Numbness and tingling in left hand   4. Transient vision disturbance   5. Intractable vomiting with nausea, unspecified vomiting type     Disposition: Admit  This note was prepared with assistance of Dragon voice recognition software. Occasional wrong-word or sound-a-like substitutions may have occurred due to the inherent limitations of voice  recognition software.     Shiori Adcox, Gwenyth Allegra, MD 08/29/19 [phone]

## 2019-08-28 NOTE — Telephone Encounter (Signed)
I called patient, I am not sure I have anything else here for her, I think she needs to proceed to the emergency room and have them perform another lumbar puncture.  I left her a message, if she does go to the emergency room let us know I can call the neuro hospitalist and the ED and explain what we need.  But if she does not improve she likely needs to be admitted however that is not my choice that depends on the neuro hospitalist.  I left her a message, please call her again thanks.

## 2019-08-28 NOTE — ED Triage Notes (Addendum)
Pt reports headache, tinnitus, dizziness, n/v, "flashes of light in my vision" x 2 days, has hx of intercranial hypertension and psuedotumor. States she saw dr Lucia Gaskins who talked to dr Otelia Limes and advised her to come in for further eval. States she has zofran for nausea that is not working. Also endorses tingling to L hand that began at 0949 this morning which she reports is typical when she has episodes like this, denies weakness to hand. Endorses episodes of blurry vision that come and go, no blurred vision at this time. A/ox4, speech clear, face symmetrical, no neuro deficits.

## 2019-08-28 NOTE — ED Notes (Signed)
Dr. Rubin Payor made aware of patient/situation, advised this RN to cancel head CT at this time.

## 2019-08-28 NOTE — Telephone Encounter (Signed)
Pt states she has had a headache for about a day and half and rates the pain at a 9 the patient states she also has   vertigo/vomitting with symptoms. Pt states she is unsure if she should come in the office or what would be the next step as she doesn't believe the zofran is working for her

## 2019-08-28 NOTE — Telephone Encounter (Signed)
Noted I called [facility] as well and referral is still pending .

## 2019-08-28 NOTE — ED Notes (Signed)
Got patient into a gown on the monitor patient is resting with call bell in reach 

## 2019-08-28 NOTE — Telephone Encounter (Signed)
This is a 44 year old patient with a past medical history of PCOS and a remote history of cerebral venous thrombosis and stroke.  She is a Engineer, civil (consulting).  Patient started having headaches and had an MRI of her brain, which saw partially empty sella.  When I first saw patient she listed off many symptoms almost verbatim that sounded like IDIOPATHIC INTRACRANIAL HYPERTENSION (PCOS and CVT can increase risk of IDIOPATHIC INTRACRANIAL HYPERTENSION).  However no papilledema and opening pressure was only 22.  We started her on Diamox she did not get better and had side effects to the medication.  She had an episode in the ED that sounded like a hemiplegic migraine so instead we switched her to Topamax from diamox.  We have tried her on migraine cocktails, a migraine cocktail helped but the headache comes back.  We have also started her on migraine prevention Ajovy.  Patient keeps calling with severe migraines today she has 9 out of 10 pain.  At this point she really has to go to the emergency room not sure what else we can do here, I' not sure what the etiology is, she likely needs another workup and lumbar puncture to ensure that the opening pressure of 22 was not an error initially. I spoke with Dr. Georg Ruddle to see if he could evaluate her in the ED if needed, we discussed MRI of the brain was not completed with contrast, possibly needs CTV, LP with opening pressure and csf labs, but I will defer to my ED and Neurohospitalist colleagues to evaluate. Very much appreciate my colleagues at [facility].

## 2019-08-29 ENCOUNTER — Encounter (HOSPITAL_COMMUNITY): Payer: Self-pay | Admitting: Internal Medicine

## 2019-08-29 ENCOUNTER — Inpatient Hospital Stay (HOSPITAL_COMMUNITY): Payer: BC Managed Care – PPO

## 2019-08-29 DIAGNOSIS — I10 Essential (primary) hypertension: Secondary | ICD-10-CM | POA: Diagnosis present

## 2019-08-29 DIAGNOSIS — E282 Polycystic ovarian syndrome: Secondary | ICD-10-CM | POA: Diagnosis present

## 2019-08-29 DIAGNOSIS — K219 Gastro-esophageal reflux disease without esophagitis: Secondary | ICD-10-CM | POA: Diagnosis present

## 2019-08-29 DIAGNOSIS — G8929 Other chronic pain: Secondary | ICD-10-CM | POA: Diagnosis not present

## 2019-08-29 DIAGNOSIS — K3184 Gastroparesis: Secondary | ICD-10-CM | POA: Diagnosis present

## 2019-08-29 DIAGNOSIS — R4701 Aphasia: Secondary | ICD-10-CM | POA: Diagnosis present

## 2019-08-29 DIAGNOSIS — G43109 Migraine with aura, not intractable, without status migrainosus: Secondary | ICD-10-CM | POA: Diagnosis not present

## 2019-08-29 DIAGNOSIS — G8194 Hemiplegia, unspecified affecting left nondominant side: Secondary | ICD-10-CM | POA: Diagnosis present

## 2019-08-29 DIAGNOSIS — Z20822 Contact with and (suspected) exposure to covid-19: Secondary | ICD-10-CM | POA: Diagnosis present

## 2019-08-29 DIAGNOSIS — R519 Headache, unspecified: Secondary | ICD-10-CM | POA: Diagnosis present

## 2019-08-29 DIAGNOSIS — R112 Nausea with vomiting, unspecified: Secondary | ICD-10-CM

## 2019-08-29 DIAGNOSIS — G43101 Migraine with aura, not intractable, with status migrainosus: Secondary | ICD-10-CM | POA: Diagnosis present

## 2019-08-29 DIAGNOSIS — Z79899 Other long term (current) drug therapy: Secondary | ICD-10-CM | POA: Diagnosis not present

## 2019-08-29 DIAGNOSIS — G4733 Obstructive sleep apnea (adult) (pediatric): Secondary | ICD-10-CM | POA: Diagnosis present

## 2019-08-29 DIAGNOSIS — Z8673 Personal history of transient ischemic attack (TIA), and cerebral infarction without residual deficits: Secondary | ICD-10-CM | POA: Diagnosis not present

## 2019-08-29 DIAGNOSIS — R7303 Prediabetes: Secondary | ICD-10-CM | POA: Diagnosis present

## 2019-08-29 DIAGNOSIS — E785 Hyperlipidemia, unspecified: Secondary | ICD-10-CM | POA: Diagnosis present

## 2019-08-29 DIAGNOSIS — Z7984 Long term (current) use of oral hypoglycemic drugs: Secondary | ICD-10-CM | POA: Diagnosis not present

## 2019-08-29 DIAGNOSIS — H532 Diplopia: Secondary | ICD-10-CM | POA: Diagnosis present

## 2019-08-29 DIAGNOSIS — R42 Dizziness and giddiness: Secondary | ICD-10-CM | POA: Diagnosis present

## 2019-08-29 LAB — CSF CELL COUNT WITH DIFFERENTIAL
RBC Count, CSF: 1330 /mm3 — ABNORMAL HIGH
Tube #: 3
WBC, CSF: 3 /mm3 (ref 0–5)

## 2019-08-29 LAB — CBC
HCT: 41.3 % (ref 36.0–46.0)
Hemoglobin: 13.6 g/dL (ref 12.0–15.0)
MCH: 30 pg (ref 26.0–34.0)
MCHC: 32.9 g/dL (ref 30.0–36.0)
MCV: 91 fL (ref 80.0–100.0)
Platelets: 211 10*3/uL (ref 150–400)
RBC: 4.54 MIL/uL (ref 3.87–5.11)
RDW: 13.7 % (ref 11.5–15.5)
WBC: 12.1 10*3/uL — ABNORMAL HIGH (ref 4.0–10.5)
nRBC: 0 % (ref 0.0–0.2)

## 2019-08-29 LAB — BASIC METABOLIC PANEL
Anion gap: 14 (ref 5–15)
BUN: 14 mg/dL (ref 6–20)
CO2: 18 mmol/L — ABNORMAL LOW (ref 22–32)
Calcium: 8.8 mg/dL — ABNORMAL LOW (ref 8.9–10.3)
Chloride: 109 mmol/L (ref 98–111)
Creatinine, Ser: 0.81 mg/dL (ref 0.44–1.00)
GFR calc Af Amer: >60 mL/min (ref >60)
GFR calc non Af Amer: >60 mL/min (ref >60)
Glucose, Bld: 96 mg/dL (ref 70–99)
Potassium: 3.9 mmol/L (ref 3.5–5.1)
Sodium: 141 mmol/L (ref 135–145)

## 2019-08-29 LAB — PROTEIN, CSF: Total  Protein, CSF: 44 mg/dL (ref 15–45)

## 2019-08-29 LAB — CRYPTOCOCCAL ANTIGEN, CSF: Crypto Ag: NEGATIVE

## 2019-08-29 LAB — GLUCOSE, CSF: Glucose, CSF: 59 mg/dL (ref 40–70)

## 2019-08-29 LAB — RESPIRATORY PANEL BY RT PCR (FLU A&B, COVID)
Influenza A by PCR: NEGATIVE
Influenza B by PCR: NEGATIVE
SARS Coronavirus 2 by RT PCR: NEGATIVE

## 2019-08-29 LAB — HIV ANTIBODY (ROUTINE TESTING W REFLEX): HIV Screen 4th Generation wRfx: NONREACTIVE

## 2019-08-29 MED ORDER — ONDANSETRON HCL 4 MG/2ML IJ SOLN: 4.0000 mg | Freq: Four times a day (QID) | INTRAMUSCULAR | Status: DC | PRN

## 2019-08-29 MED ORDER — ONDANSETRON HCL 4 MG PO TABS: 4.0000 mg | ORAL_TABLET | Freq: Four times a day (QID) | ORAL | Status: DC | PRN

## 2019-08-29 MED ORDER — AMITRIPTYLINE HCL 25 MG PO TABS
25.0000 mg | ORAL_TABLET | Freq: Every day | ORAL | Status: DC
  Administered 2019-08-29: 21:00:00 25 mg via ORAL
  Filled 2019-08-29: qty 1

## 2019-08-29 MED ORDER — TRAMADOL HCL 50 MG PO TABS
50.0000 mg | ORAL_TABLET | Freq: Four times a day (QID) | ORAL | Status: DC | PRN
  Administered 2019-08-29 – 2019-08-30 (×3): 50 mg via ORAL
  Filled 2019-08-29 (×3): qty 1

## 2019-08-29 MED ORDER — LOSARTAN POTASSIUM 50 MG PO TABS
50.0000 mg | ORAL_TABLET | Freq: Every day | ORAL | Status: DC
  Administered 2019-08-29 – 2019-08-30 (×2): 50 mg via ORAL
  Filled 2019-08-29 (×2): qty 1

## 2019-08-29 MED ORDER — ACETAMINOPHEN 325 MG PO TABS
650.0000 mg | ORAL_TABLET | ORAL | Status: DC | PRN
  Administered 2019-08-29 – 2019-08-30 (×2): 650 mg via ORAL
  Filled 2019-08-29 (×2): qty 2

## 2019-08-29 MED ORDER — FAMOTIDINE 20 MG PO TABS
40.0000 mg | ORAL_TABLET | Freq: Two times a day (BID) | ORAL | Status: DC
  Administered 2019-08-29 – 2019-08-30 (×3): 40 mg via ORAL
  Filled 2019-08-29 (×3): qty 2

## 2019-08-29 MED ORDER — TOPIRAMATE 100 MG PO TABS
100.0000 mg | ORAL_TABLET | Freq: Two times a day (BID) | ORAL | Status: DC
  Administered 2019-08-29 – 2019-08-30 (×3): 100 mg via ORAL
  Filled 2019-08-29 (×3): qty 1

## 2019-08-29 MED ORDER — PROMETHAZINE HCL 25 MG/ML IJ SOLN
25.0000 mg | Freq: Once | INTRAMUSCULAR | Status: AC
  Administered 2019-08-29: 25 mg via INTRAVENOUS
  Filled 2019-08-29: qty 1

## 2019-08-29 MED ORDER — LIDOCAINE HCL (PF) 1 % IJ SOLN
5.0000 mL | Freq: Once | INTRAMUSCULAR | Status: AC
  Administered 2019-08-29: 10 mL via INTRADERMAL

## 2019-08-29 NOTE — Procedures (Signed)
Lumbar Puncture Procedure Note  Pre-operative Diagnosis: Headache  Post-operative Diagnosis: Headache  Indications: Diagnostic  Procedure Details   Consent: Informed consent was obtained. Risks of the procedure were discussed including: infection, bleeding, pain and headache.  The patient was positioned under sterile conditions. Betadine solution and sterile drapes were utilized. A spinal needle was inserted at the L2 - L3 interspace. Opening pressure was measured. Spinal fluid was obtained and sent to the laboratory.  Findings See dictation in PACS  Complications:  None; patient tolerated the procedure well.        Condition: stable  Plan Bed rest for 2 hours. Tylenol 650 mg for pain.

## 2019-08-29 NOTE — H&P (Signed)
History and Physical    Shelley Ryan JQZ:[id number] DOB: Jun 11, 1975 DOA: 08/28/2019  PCP: [name], MD  Patient coming from: Home.  Chief Complaint: Headaches.  HPI: Shelley Ryan is a 44 y.o. 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  As per the patient lumbar puncture done 2 months ago showed opening pressure of around 245mHg and was empirically treated with Diamox since patient symptoms improved with lumbar puncture.  But the headache started recurring again and Diamox was discontinued about 2 weeks ago and Topamax was started.  Over the last 2 days patient headache continued to worsen mostly in the bitemporal and occipital area with no fever chills has some nausea and vomiting with tinnitus and dizziness.  Did not have any loss of consciousness or fall.  Given the symptoms patient was referred to the ER for lumbar puncture and further work-up for the headache.  ED Course: In the ER patient is afebrile and appears nonfocal.  CT venogram of the head was negative for any DVT MRI brain was also not showing anything acute.  Plan is to get lumbar puncture under fluoroscopy guidance for which interventional radiology has been consulted.  Covid test was negative labs show creatinine 1.1 WBC 15.3.  Review of Systems: As per HPI, rest all negative.   Past Medical History:  Diagnosis Date  . Anemia   . Bilateral swelling of feet   . BRCA1 negative 10/08/2013  . CVA (cerebral infarction) 2002   Most likely from Hormonal Tx  . 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 Gallbladder problem   . Gastric outlet obstruction   . Gastroparesis   . H/O blood clots   . Heartburn   . Hiatal hernia   .  Hyperlipidemia   . Hypertension   . IIH (idiopathic intracranial hypertension)   . Inflammatory arthritis   . Joint pain   . Low vitamin D level    2013  17  . OSA (obstructive sleep apnea)   . PCOS (polycystic ovarian syndrome)    stroke on OCP hormonal therapy  . Prediabetes   . Pylorospasm   . Stroke (cerebrum) ([facility]   . Vitamin D deficiency     Past Surgical History:  Procedure Laterality Date  . APPENDECTOMY    . CHOLECYSTECTOMY N/A 10/09/2013   Procedure: LAPAROSCOPIC CHOLECYSTECTOMY WITH INTRAOPERATIVE CHOLANGIOGRAM;  Surgeon: MPedro Earls MD;  Location: [facility];  Service: General;  Laterality: N/A;  . COLON SURGERY    . KNEE ARTHROSCOPY Bilateral   . PYLOROPLASTY    . RIGHT OOPHORECTOMY    . SALPINGECTOMY Bilateral   . TYMPANOSTOMY TUBE PLACEMENT     x 2  . WISDOM TOOTH EXTRACTION       reports that she has never smoked. She has never used smokeless tobacco. She reports that she does not drink alcohol or use drugs.  Allergies  Allergen Reactions  . Cisapride Anaphylaxis  . Clarithromycin Nausea And Vomiting and Other (See Comments)    Acute renal failure, also  . Estrogens Other (See Comments)    Patient states she has a stroke and Blood clots  . Tape Rash and Other (See Comments)    Can only use  paper tape- NO PLASTIC/CLEAR TAPE!!!!  . Cholestyramine Nausea And Vomiting  . Ciprofloxacin Hives and Rash  . Keflex [Cephalexin] Nausea And Vomiting and Other (See Comments)    Tolerates rocephin  . Metronidazole Diarrhea and Nausea And Vomiting       . Minocycline Hcl Nausea And Vomiting and Other (See Comments)    Caused MIGRAINES  . Tetracyclines & Related Nausea And Vomiting and Other (See Comments)    Caused MIGRAINES, also  . Unasyn [Ampicillin-Sulbactam Sodium] Nausea And Vomiting  . Ampicillin Diarrhea       . Codeine Itching, Rash and Other (See Comments)    Can tolerate Tramadol  . Metoclopramide Other (See Comments)    Made me "not act like  myself"    Family History  Problem Relation Age of Onset  . Obesity Mother   . Hyperlipidemia Father   . Breast cancer Other        Great Grandmother   . Breast cancer Other   . Headache Neg Hx   . Migraines Neg Hx     Prior to Admission medications   Medication Sig Start Date End Date Taking? Authorizing Provider  acetaminophen (TYLENOL) 325 MG tablet Take 650 mg by mouth every 6 (six) hours as needed for mild pain.    Yes [provider]  amitriptyline (ELAVIL) 25 MG tablet Take 25 mg by mouth at bedtime. 08/09/18  Yes [provider]  famotidine (PEPCID) 40 MG tablet Take 40 mg by mouth 2 (two) times daily.   Yes [provider]  Fremanezumab-vfrm (AJOVY) 225 MG/1.5ML SOAJ Inject 225 mg into the skin every 30 (thirty) days. 07/31/19  Yes Ward Givens, NP  hydroxypropyl methylcellulose / hypromellose (ISOPTO TEARS / GONIOVISC) 2.5 % ophthalmic solution Place 1 drop into both eyes daily as needed for dry eyes.   Yes [provider]  losartan (COZAAR) 50 MG tablet Take 0.5 tablets (25 mg total) by mouth daily. Patient taking differently: Take 50 mg by mouth daily.  08/07/19  Yes Eber Jones, MD  metFORMIN (GLUCOPHAGE) 500 MG tablet Take 500 mg by mouth 2 (two) times a day. 07/10/18  Yes [provider]  Multiple Vitamins-Minerals (MULTIVITAMIN WITH MINERALS) tablet Take 1 tablet by mouth daily.   Yes [provider]  ondansetron (ZOFRAN-ODT) 4 MG disintegrating tablet Take 4 mg by mouth every 4 (four) hours as needed for nausea or vomiting (DISSOLVE ORALLY).    Yes [provider]  rizatriptan (MAXALT-MLT) 10 MG disintegrating tablet Take 1 tablet (10 mg total) by mouth as needed for migraine. May repeat in 2 hours if needed 08/20/19  Yes Melvenia Beam, MD  topiramate (TOPAMAX) 100 MG tablet Take 1 tablet (100 mg total) by mouth 2 (two) times daily. 08/23/19  Yes Melvenia Beam, MD  Ubrogepant (UBRELVY) 50 MG TABS  Take 50 mg by mouth as needed. At the onset of migraine- can repeat in 2 hours if needed Patient taking differently: Take 50 mg by mouth See admin instructions. Take 50 mg by mouth at onset of a migraine and may repeat once in 2 hours. 07/31/19  Yes Ward Givens, NP  Vitamin D, Ergocalciferol, (DRISDOL) 1.25 MG (50000 UNIT) CAPS capsule Take 1 capsule (50,000 Units total) by mouth every 7 (seven) days. Patient taking differently: Take 50,000 Units by mouth every Monday.  08/07/19  Yes Eber Jones, MD    Physical Exam: Constitutional: Moderately built and nourished. Vitals:   08/28/19 1837 08/28/19 2045  08/28/19 2302 08/29/19 0211  BP: 127/89 118/76 125/79 133/81  Pulse:  (!) 118 (!) 104 81  Resp: '18 15 15 18  ' Temp:    98.2 F (36.8 C)  TempSrc:    Oral  SpO2:  98% 98% 100%  Weight:      Height:       Eyes: Anicteric no pallor. ENMT: No discharge from the ears eyes nose or mouth. Neck: No mass felt.  No neck rigidity. Respiratory: No rhonchi or crepitations. Cardiovascular: S1-S2 heard. Abdomen: Soft nontender bowel sounds present. Musculoskeletal: No edema. Skin: No rash. Neurologic: Alert awake oriented time place and person.  Moves all extremities. Psychiatric: Appears normal with normal affect.   Labs on Admission: I have personally reviewed following labs and imaging studies  CBC: Recent Labs  Lab 08/28/19 1358 08/28/19 1422  WBC 15.3*  --   NEUTROABS 12.3*  --   HGB 15.4* 16.0*  HCT 46.7* 47.0*  MCV 90.9  --   PLT 259  --    Basic Metabolic Panel: Recent Labs  Lab 08/28/19 1358 08/28/19 1422  NA 139 140  K 3.8 3.8  CL 110 109  CO2 18*  --   GLUCOSE 113* 113*  BUN 13 16  CREATININE 1.10* 0.90  CALCIUM 9.3  --    GFR: Estimated Creatinine Clearance: 97.8 mL/min (by C-G formula based on SCr of 0.9 mg/dL). Liver Function Tests: Recent Labs  Lab 08/28/19 1358  AST 26  ALT 39  ALKPHOS 67  BILITOT 1.1  PROT 7.6  ALBUMIN 4.2   No results  for input(s): LIPASE, AMYLASE in the last 168 hours. No results for input(s): AMMONIA in the last 168 hours. Coagulation Profile: Recent Labs  Lab 08/28/19 1358  INR 1.0   Cardiac Enzymes: No results for input(s): CKTOTAL, CKMB, CKMBINDEX, TROPONINI in the last 168 hours. BNP (last 3 results) No results for input(s): PROBNP in the last 8760 hours. HbA1C: No results for input(s): HGBA1C in the last 72 hours. CBG: No results for input(s): GLUCAP in the last 168 hours. Lipid Profile: No results for input(s): CHOL, HDL, LDLCALC, TRIG, CHOLHDL, LDLDIRECT in the last 72 hours. Thyroid Function Tests: No results for input(s): TSH, T4TOTAL, FREET4, T3FREE, THYROIDAB in the last 72 hours. Anemia Panel: No results for input(s): VITAMINB12, FOLATE, FERRITIN, TIBC, IRON, RETICCTPCT in the last 72 hours. Urine analysis:    Component Value Date/Time   COLORURINE YELLOW 06/11/2019 2000   APPEARANCEUR CLEAR 06/11/2019 2000   LABSPEC 1.021 06/11/2019 2000   PHURINE 5.0 06/11/2019 2000   GLUCOSEU NEGATIVE 06/11/2019 2000   HGBUR SMALL (A) 06/11/2019 2000   BILIRUBINUR NEGATIVE 06/11/2019 2000   KETONESUR NEGATIVE 06/11/2019 2000   PROTEINUR NEGATIVE 06/11/2019 2000   UROBILINOGEN 0.2 10/08/2013 1510   NITRITE NEGATIVE 06/11/2019 2000   LEUKOCYTESUR NEGATIVE 06/11/2019 2000   Sepsis Labs: '@LABRCNTIP' (procalcitonin:4,lacticidven:4) ) Recent Results (from the past 240 hour(s))  Respiratory Panel by RT PCR (Flu A&B, Covid) - Nasopharyngeal Swab     Status: None   Collection Time: 08/29/19 12:44 AM   Specimen: Nasopharyngeal Swab  Result Value Ref Range Status   SARS Coronavirus 2 by RT PCR NEGATIVE NEGATIVE Final    Comment: (NOTE) SARS-CoV-2 target nucleic acids are NOT DETECTED. The SARS-CoV-2 RNA is generally detectable in upper respiratoy specimens during the acute phase of infection. The lowest concentration of SARS-CoV-2 viral copies this assay can detect is 131 copies/mL. A  negative result does not preclude SARS-Cov-2 infection and  should not be used as the sole basis for treatment or other patient management decisions. A negative result may occur with  improper specimen collection/handling, submission of specimen other than nasopharyngeal swab, presence of viral mutation(s) within the areas targeted by this assay, and inadequate number of viral copies (<131 copies/mL). A negative result must be combined with clinical observations, patient history, and epidemiological information. The expected result is Negative. Fact Sheet for Patients:  PinkCheek.be Fact Sheet for Healthcare Providers:  GravelBags.it This test is not yet ap proved or cleared by the [place] FDA and  has been authorized for detection and/or diagnosis of SARS-CoV-2 by FDA under an Emergency Use Authorization (EUA). This EUA will remain  in effect (meaning this test can be used) for the duration of the COVID-19 declaration under Section 564(b)(1) of the Act, 21 U.S.C. section 360bbb-3(b)(1), unless the authorization is terminated or revoked sooner.    Influenza A by PCR NEGATIVE NEGATIVE Final   Influenza B by PCR NEGATIVE NEGATIVE Final    Comment: (NOTE) The Xpert Xpress SARS-CoV-2/FLU/RSV assay is intended as an aid in  the diagnosis of influenza from Nasopharyngeal swab specimens and  should not be used as a sole basis for treatment. Nasal washings and  aspirates are unacceptable for Xpert Xpress SARS-CoV-2/FLU/RSV  testing. Fact Sheet for Patients: PinkCheek.be Fact Sheet for Healthcare Providers: GravelBags.it This test is not yet approved or cleared by the [place] FDA and  has been authorized for detection and/or diagnosis of SARS-CoV-2 by  FDA under an Emergency Use Authorization (EUA). This EUA will remain  in effect (meaning this test can be used) for  the duration of the  Covid-19 declaration under Section 564(b)(1) of the Act, 21  U.S.C. section 360bbb-3(b)(1), unless the authorization is  terminated or revoked. Performed at [facility], [place] [address], [place], [place] [address]      Radiological Exams on Admission: MR Brain W and Wo Contrast  Result Date: 08/28/2019 CLINICAL DATA:  Headache. Tinnitus dizziness vision changes vomiting and left hand tingling. EXAM: MRI HEAD WITHOUT AND WITH CONTRAST TECHNIQUE: Multiplanar, multiecho pulse sequences of the brain and surrounding structures were obtained without and with intravenous contrast. CONTRAST:  54m GADAVIST GADOBUTROL 1 MMOL/ML IV SOLN COMPARISON:  MRI head 08/08/2019 FINDINGS: Brain: Negative for acute infarct. Scattered small deep white matter hyperintensities bilaterally. Brainstem and cerebellum normal. Negative for hemorrhage or mass. Normal enhancement postcontrast administration. Ventricle size is normal. Vascular: Normal arterial flow voids Skull and upper cervical spine: Negative Sinuses/Orbits: Negative Other: None IMPRESSION: No acute abnormality. Scattered small deep white matter hyperintensities bilaterally. Findings are most consistent with chronic microvascular ischemia or complex migraine headaches. No change from the recent study. Electronically Signed   By: CFranchot GalloM.D.   On: 08/28/2019 20:23   CT VENOGRAM HEAD  Result Date: 08/28/2019 CLINICAL DATA:  Initial evaluation for persistent headache with nausea, vomiting, visual changes, tinnitus. EXAM: CT VENOGRAM HEAD TECHNIQUE: Dedicated CT venogram images of the head was performed using the standard protocol. CONTRAST:  769mOMNIPAQUE IOHEXOL 350 MG/ML SOLN COMPARISON:  Concomitant MRI of the brain performed on the same day. FINDINGS: Brain: Cerebral volume within normal limits for patient age. Few scattered hypodensities involving the supratentorial cerebral white matter noted, nonspecific, better evaluated on  prior brain MRI. No evidence for acute intracranial hemorrhage. No findings to suggest acute large vessel territory infarct. No mass lesion, midline shift, or mass effect. Ventricles are normal in size without evidence for  hydrocephalus. No extra-axial fluid collection identified. Vascular: No hyperdense vessel identified on precontrast imaging. Following contrast administration, normal opacification of the superior sagittal sinus to the level of the torcula is seen. Torcula itself is patent. Transverse and sigmoid sinuses are patent as are the partially visualized proximal internal jugular veins. Straight sinus, vein of Galen, internal cerebral veins, and basal veins of Rosenthal patent. No evidence for dural sinus thrombosis. No appreciable dural sinus stenosis. Skull: Scalp soft tissues demonstrate no acute abnormality. Calvarium intact. Sinuses/Orbits: Globes and orbital soft tissues within normal limits. Visualized paranasal sinuses are clear. No mastoid effusion. IMPRESSION: Negative CT venogram. No evidence for dural sinus thrombosis or other abnormality. Electronically Signed   By: Jeannine Boga M.D.   On: 08/28/2019 22:47    EKG: Independently reviewed.  Sinus tachycardia.  Assessment/Plan Principal Problem:   Headache Active Problems:   PCOS (polycystic ovarian syndrome)   GERD (gastroesophageal reflux disease)   Essential hypertension    1. Intractable headache -neurologist was consulted at this time they requested a lumbar puncture and labs which have been already ordered.  Further recommendation will be based on the above-mentioned findings.  Patient is presently afebrile. 2. Hypertension on ARB. 3. History of PCOS on Metformin. 4. History of GERD on H2 blocker. 5. Leukocytosis cause not clear.  Will monitor.  Since patient has intractable headache and will need further close monitoring for any deterioration patient will need inpatient status.   DVT prophylaxis: Since  patient is going to get lumbar puncture avoiding prophylaxis for now. Code Status: Full code. Family Communication: Discussed with patient. Disposition Plan: Home. Consults called: ER physician has discussed with neurologist. Admission status: Inpatient.   Rise Patience MD [facility] Pager [phone].  If 7PM-7AM, please contact night-coverage [email or web address] Password [email or web address]  08/29/2019, 2:57 AM

## 2019-08-29 NOTE — Progress Notes (Signed)
[facility] PROGRESS NOTE    Progress Note  Shelley Ryan  JJO:[id number] DOB: 07/26/1975 DOA: 08/28/2019 PCP: [name], MD     Brief Narrative:   Shelley Ryan is an 44 y.o. 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  As per patient a lumbar puncture done 2 months ago showing opening pressure around 220 mmHg.  And was treated empirically with Diamox since then symptoms improved with lumbar puncture.  Assessment/Plan:   Intractable Headache Imaging study showed no central venous sinus thrombosis Neurology was consulted and recommended a lumbar puncture. Patient is afebrile with no leukocytosis. Neurology also recommended to check IgG oligoclonal banding cryptococcal antigen cell count with differential protein and glucose. They also started her on IV Phenergan, and they are possibly considering [facility] ED after lumbar puncture. She continues to have severe headaches and her pain is uncontrollable.  Essential hypertension Continue losartan.  DVT prophylaxis: scd Family Communication:mother Status is: Inpatient  Remains inpatient appropriate because:IV treatments appropriate due to intensity of illness or inability to take PO   Dispo: The patient is from: Home              Anticipated d/c is to: Home              Anticipated d/c date is: 1 day              Patient currently is not medically stable to d/c.         Code Status:     Code Status Orders  (From admission, onward)         Start     Ordered   08/29/19 0257  Full code  Continuous     08/29/19 0256        Code Status History    Date Active Date Inactive Code Status Order ID Comments User Context   10/09/2013 1525  10/10/2013 1327 Full Code [id number]  [name], MD Inpatient   10/08/2013 2059 10/09/2013 1525 Full Code [id number]  [name], MD ED   Advance Care Planning Activity        IV Access:    Peripheral IV   Procedures and diagnostic studies:   MR Brain W and Wo Contrast  Result Date: 08/28/2019 CLINICAL DATA:  Headache. Tinnitus dizziness vision changes vomiting and left hand tingling. EXAM: MRI HEAD WITHOUT AND WITH CONTRAST TECHNIQUE: Multiplanar, multiecho pulse sequences of the brain and surrounding structures were obtained without and with intravenous contrast. CONTRAST:  64mL GADAVIST GADOBUTROL 1 MMOL/ML IV SOLN COMPARISON:  MRI head 08/08/2019 FINDINGS: Brain: Negative for acute infarct. Scattered small deep white matter hyperintensities bilaterally. Brainstem and cerebellum normal. Negative for hemorrhage or mass. Normal enhancement postcontrast administration. Ventricle size is normal. Vascular: Normal arterial flow voids Skull and upper cervical spine: Negative Sinuses/Orbits: Negative Other: None IMPRESSION: No acute abnormality. Scattered small deep white matter hyperintensities bilaterally. Findings are most consistent with chronic microvascular ischemia or complex migraine headaches. No change from the recent study. Electronically Signed   By: Franchot Gallo M.D.   On: 08/28/2019 20:23   CT VENOGRAM HEAD  Result Date: 08/28/2019 CLINICAL DATA:  Initial evaluation for persistent headache with nausea, vomiting, visual changes, tinnitus. EXAM:  CT VENOGRAM HEAD TECHNIQUE: Dedicated CT venogram images of the head was performed using the standard protocol. CONTRAST:  39mL OMNIPAQUE IOHEXOL 350 MG/ML SOLN COMPARISON:  Concomitant MRI of the brain performed on the same day. FINDINGS: Brain: Cerebral volume within normal limits for patient age. Few scattered hypodensities involving the supratentorial cerebral white matter noted, nonspecific, better evaluated on prior brain MRI. No evidence  for acute intracranial hemorrhage. No findings to suggest acute large vessel territory infarct. No mass lesion, midline shift, or mass effect. Ventricles are normal in size without evidence for hydrocephalus. No extra-axial fluid collection identified. Vascular: No hyperdense vessel identified on precontrast imaging. Following contrast administration, normal opacification of the superior sagittal sinus to the level of the torcula is seen. Torcula itself is patent. Transverse and sigmoid sinuses are patent as are the partially visualized proximal internal jugular veins. Straight sinus, vein of Galen, internal cerebral veins, and basal veins of Rosenthal patent. No evidence for dural sinus thrombosis. No appreciable dural sinus stenosis. Skull: Scalp soft tissues demonstrate no acute abnormality. Calvarium intact. Sinuses/Orbits: Globes and orbital soft tissues within normal limits. Visualized paranasal sinuses are clear. No mastoid effusion. IMPRESSION: Negative CT venogram. No evidence for dural sinus thrombosis or other abnormality. Electronically Signed   By: Rise Mu M.D.   On: 08/28/2019 22:47     Medical Consultants:    None.  Anti-Infectives:   none  Subjective:    Shelley Ryan patient relates she is not tolerating anything by mouth and continues to have severe headaches  Objective:    Vitals:   08/29/19 0211 08/29/19 0412 08/29/19 0718 08/29/19 1125  BP: 133/81 (!) 136/96 135/90 127/86  Pulse: 81 85 85 81  Resp: 18 18 19 18   Temp: 98.2 F (36.8 C) 98.1 F (36.7 C) 97.9 F (36.6 C) 98.6 F (37 C)  TempSrc: Oral Oral Oral Oral  SpO2: 100% 100% 100% 100%  Weight:      Height:       SpO2: 100 %   Intake/Output Summary (Last 24 hours) at 08/29/2019 1128 Last data filed at 08/28/2019 2209 Gross per 24 hour  Intake 1000 ml  Output --  Net 1000 ml   Filed Weights   08/28/19 1346  Weight: 103.9 kg    Exam: General exam: In no acute  distress. Respiratory system: Good air movement and clear to auscultation. Cardiovascular system: S1 & S2 heard, RRR. No JVD. Gastrointestinal system: Abdomen is nondistended, soft and nontender.  Central nervous system: Alert and oriented. No focal neurological deficits. Extremities: No pedal edema. Skin: No rashes, lesions or ulcers  Data Reviewed:    Labs: Basic Metabolic Panel: Recent Labs  Lab 08/28/19 1358 08/28/19 1358 08/28/19 1422 08/29/19 0549  NA 139  --  140 141  K 3.8   < > 3.8 3.9  CL 110  --  109 109  CO2 18*  --   --  18*  GLUCOSE 113*  --  113* 96  BUN 13  --  16 14  CREATININE 1.10*  --  0.90 0.81  CALCIUM 9.3  --   --  8.8*   < > = values in this interval not displayed.   GFR Estimated Creatinine Clearance: 108.7 mL/min (by C-G formula based on SCr of 0.81 mg/dL). Liver Function Tests: Recent Labs  Lab 08/28/19 1358  AST 26  ALT 39  ALKPHOS 67  BILITOT 1.1  PROT 7.6  ALBUMIN 4.2   No results for input(s): LIPASE,  AMYLASE in the last 168 hours. No results for input(s): AMMONIA in the last 168 hours. Coagulation profile Recent Labs  Lab 08/28/19 1358  INR 1.0   COVID-19 Labs  No results for input(s): DDIMER, FERRITIN, LDH, CRP in the last 72 hours.  Lab Results  Component Value Date   SARSCOV2NAA NEGATIVE 08/29/2019   SARSCOV2NAA NEGATIVE 06/13/2019    CBC: Recent Labs  Lab 08/28/19 1358 08/28/19 1422 08/29/19 0549  WBC 15.3*  --  12.1*  NEUTROABS 12.3*  --   --   HGB 15.4* 16.0* 13.6  HCT 46.7* 47.0* 41.3  MCV 90.9  --  91.0  PLT 259  --  211   Cardiac Enzymes: No results for input(s): CKTOTAL, CKMB, CKMBINDEX, TROPONINI in the last 168 hours. BNP (last 3 results) No results for input(s): PROBNP in the last 8760 hours. CBG: No results for input(s): GLUCAP in the last 168 hours. D-Dimer: No results for input(s): DDIMER in the last 72 hours. Hgb A1c: No results for input(s): HGBA1C in the last 72 hours. Lipid  Profile: No results for input(s): CHOL, HDL, LDLCALC, TRIG, CHOLHDL, LDLDIRECT in the last 72 hours. Thyroid function studies: No results for input(s): TSH, T4TOTAL, T3FREE, THYROIDAB in the last 72 hours.  Invalid input(s): FREET3 Anemia work up: No results for input(s): VITAMINB12, FOLATE, FERRITIN, TIBC, IRON, RETICCTPCT in the last 72 hours. Sepsis Labs: Recent Labs  Lab 08/28/19 1358 08/29/19 0549  WBC 15.3* 12.1*   Microbiology Recent Results (from the past 240 hour(s))  Respiratory Panel by RT PCR (Flu A&B, Covid) - Nasopharyngeal Swab     Status: None   Collection Time: 08/29/19 12:44 AM   Specimen: Nasopharyngeal Swab  Result Value Ref Range Status   SARS Coronavirus 2 by RT PCR NEGATIVE NEGATIVE Final    Comment: (NOTE) SARS-CoV-2 target nucleic acids are NOT DETECTED. The SARS-CoV-2 RNA is generally detectable in upper respiratoy specimens during the acute phase of infection. The lowest concentration of SARS-CoV-2 viral copies this assay can detect is 131 copies/mL. A negative result does not preclude SARS-Cov-2 infection and should not be used as the sole basis for treatment or other patient management decisions. A negative result may occur with  improper specimen collection/handling, submission of specimen other than nasopharyngeal swab, presence of viral mutation(s) within the areas targeted by this assay, and inadequate number of viral copies (<131 copies/mL). A negative result must be combined with clinical observations, patient history, and epidemiological information. The expected result is Negative. Fact Sheet for Patients:  https://www.moore.com/ Fact Sheet for Healthcare Providers:  https://www.young.biz/ This test is not yet ap proved or cleared by the [place] FDA and  has been authorized for detection and/or diagnosis of SARS-CoV-2 by FDA under an Emergency Use Authorization (EUA). This EUA will remain  in  effect (meaning this test can be used) for the duration of the COVID-19 declaration under Section 564(b)(1) of the Act, 21 U.S.C. section 360bbb-3(b)(1), unless the authorization is terminated or revoked sooner.    Influenza A by PCR NEGATIVE NEGATIVE Final   Influenza B by PCR NEGATIVE NEGATIVE Final    Comment: (NOTE) The Xpert Xpress SARS-CoV-2/FLU/RSV assay is intended as an aid in  the diagnosis of influenza from Nasopharyngeal swab specimens and  should not be used as a sole basis for treatment. Nasal washings and  aspirates are unacceptable for Xpert Xpress SARS-CoV-2/FLU/RSV  testing. Fact Sheet for Patients: https://www.moore.com/ Fact Sheet for Healthcare Providers: https://www.young.biz/ This test is not yet approved  or cleared by the [place] and  has been authorized for detection and/or diagnosis of SARS-CoV-2 by  FDA under an Emergency Use Authorization (EUA). This EUA will remain  in effect (meaning this test can be used) for the duration of the  Covid-19 declaration under Section 564(b)(1) of the Act, 21  U.S.C. section 360bbb-3(b)(1), unless the authorization is  terminated or revoked. Performed at [facility], [address], [place], [place] [address]      Medications:   . amitriptyline  25 mg Oral QHS  . famotidine  40 mg Oral BID  . losartan  50 mg Oral Daily  . promethazine  25 mg Intravenous Once  . sodium chloride flush  3 mL Intravenous Once  . topiramate  100 mg Oral BID   Continuous Infusions:    LOS: 0 days   Marinda Elk  [facility]  08/29/2019, 11:28 AM

## 2019-08-29 NOTE — Consult Note (Addendum)
Neurology Consultation  Reason for Consult: Chronic daily headache with question of pseudotumor cerebri  Referring Physician: Dr. Aileen Fass  CC: Chronic headache  History is obtained from: Patient and chart  HPI: Shelley Ryan is a 44 y.o. female who presents for evaluation of chronic intractable headache with intermittent associated neurological deficits and migraine-like quality, with an outpatient DDx of complicated migraine versus idiopathic intracranial hypertension (IIH/pseudotumor cerebri). 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    Per Dr. Cathren Laine previous note: "Patient has history of dural sinus thrombosis(2002), She does not have a history of headache per patient. No history of migraines, no history of migraines in the family. She has sleep apnea and is very compliant with cpap and she follows closely with a sleep doctor. This is day 8 of her headaches, mostly diffuse and like a poker on the left side of the head, base of the skull, nausea and vertigo, tinniitus and a whooshing in her ears.  Headache is worse laying down when flat, worse in the morning and better throughout the day. she has had hormonal testing and has seen her primary care for this, she has an IUD, placed quite some time ago. Also vision changes, she has an ophthalmologist she just went and no papilledema. She has a lot of tightness in the cervical muscles as well that pre-dates the new symptoms will send for PT. She has left sided weakness and hemisensory loss from prior dural sinus thrombosis, worsening, may be recrudescence of symptoms as workup revealed no new issues. No other focal neurologic deficits, associated symptoms, inciting events or modifiable factors.  Patient has had a LP which showed an opening pressure of 22.  Patient is also now complaining  of word finding problems.  Transient aphasia and hemiparesis.  There is question of idiopathic intracranial hypertension.  Patient is on Topamax at this time which is changed from Diamox.  Patient has not shown any papilledema."  Dr. Jaynee Eagles has contacted [facility] for referral; however, she is waiting for their decision.  On consultation with patient today she states that the headache started on February 15 with concomitant neurological symptom of aphasia.  She also has had transient left hemiparesis in association with her persisting headache. She has had no car accidents and/or head trauma.  Patient states that she has a chronic daily headache since onset on Feb 15 which ranges from a 3-9/10.  She does take triptans which will sometimes take her headache down to 2.  Topamax has had no effect. Diamox had no effect. Compazine results in transient relief. Ajovy (fremanezumab) has had no effect. Headache is described as achy/throbbing.  Headache starts in the temporal/occipital region especially on the left and then will move to the right.  Sometimes over the left eye.  There is positive nausea vomiting involved for which she does take Zofran.  Patient is on amitriptyline but that is not for her headache, it is for gastroparesis.  Over the past 3 days she says that she has been having some flashing/blurred/diplopia in her vision. She is at this point complaining of some tingling in her left hand "but not in any neurological distribution ".  She also feels as though she is having some left-sided weakness.  Currently patient states that she has a 7/10 headache; however, she is sitting very comfortably with the blinds open.  Patient also  states that she has lost some weight.  She states that she is only seeing Dr. Jaynee Eagles for her headaches, but is pending approval for a headache evaluation at [facility].   Past Medical History:  Diagnosis Date  . Anemia   . Bilateral swelling of feet   . BRCA1 negative 10/08/2013  .  CVA (cerebral infarction) 2002   Most likely from Hormonal Tx  . 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 Gallbladder problem   . Gastric outlet obstruction   . Gastroparesis   . H/O blood clots   . Heartburn   . Hiatal hernia   . Hyperlipidemia   . Hypertension   . IIH (idiopathic intracranial hypertension)   . Inflammatory arthritis   . Joint pain   . Low vitamin D level    2013  17  . OSA (obstructive sleep apnea)   . PCOS (polycystic ovarian syndrome)    stroke on OCP hormonal therapy  . Prediabetes   . Pylorospasm   . Stroke (cerebrum) ([place])   . Vitamin D deficiency      Family History  Problem Relation Age of Onset  . Obesity Mother   . Hyperlipidemia Father   . Breast cancer Other        Great Grandmother   . Breast cancer Other   . Headache Neg Hx   . Migraines Neg Hx     Social History:   reports that she has never smoked. She has never used smokeless tobacco. She reports that she does not drink alcohol or use drugs.  Medications  Current Facility-Administered Medications:  .  amitriptyline (ELAVIL) tablet 25 mg, 25 mg, Oral, QHS, Rise Patience, MD .  famotidine (PEPCID) tablet 40 mg, 40 mg, Oral, BID, Rise Patience, MD .  losartan (COZAAR) tablet 50 mg, 50 mg, Oral, Daily, Rise Patience, MD .  ondansetron [facility]) tablet 4 mg, 4 mg, Oral, Q6H PRN **OR** ondansetron (ZOFRAN) injection 4 mg, 4 mg, Intravenous, Q6H PRN, Rise Patience, MD .  sodium chloride flush (NS) 0.9 % injection 3 mL, 3 mL, Intravenous, Once, Rise Patience, MD .  topiramate (TOPAMAX) tablet 100 mg, 100 mg, Oral, BID, Rise Patience, MD   Allergies: Ciprofloxacin biaxin Codeine Propulsid Minocycline Flagyl Tetracycline Reglan Keflex Unasyn Questran Estrogen  ROS:    General ROS: Positive for -  weight loss Psychological ROS: negative for - behavioral disorder, hallucinations, memory  difficulties, mood swings or suicidal ideation Ophthalmic ROS: Positive for - blurry vision, double vision ENT ROS: negative for - tinnitus or vertigo Allergy and Immunology ROS: negative for - hives or itchy/watery eyes Hematological and Lymphatic ROS: negative for - bleeding problems, bruising or swollen lymph nodes Respiratory ROS: negative for -  shortness of breath or wheezing Cardiovascular ROS: negative for - chest pain, dyspnea on exertion Gastrointestinal ROS: Positive for - nausea/vomiting  Genito-Urinary ROS: negative for - dysuria, hematuria, incontinence or urinary frequency/urgency Musculoskeletal ROS: negative for - joint swelling or muscular weakness Neurological ROS: as noted in HPI Dermatological ROS: negative for rash and skin lesion changes  Exam: Current vital signs: BP 135/90 (BP Location: Left Arm)   Pulse 85   Temp 97.9 F (36.6 C) (Oral)   Resp 19   Ht 5' 7" (1.702 m)   Wt 103.9 kg   LMP 08/20/2019   SpO2 100%   BMI 35.87 kg/m  Vital signs in last 24 hours:  Temp:  [97.9 F (36.6 C)-98.4 F (36.9 C)] 97.9 F (36.6 C) (05/05 0718) Pulse Rate:  [81-133] 85 (05/05 0718) Resp:  [14-22] 19 (05/05 0718) BP: (114-136)/(76-111) 135/90 (05/05 0718) SpO2:  [98 %-100 %] 100 % (05/05 0718) Weight:  [103.9 kg] 103.9 kg (05/04 1346)   Constitutional: Appears well-developed and well-nourished.  Psych: Affect appropriate to situation Eyes: No scleral injection HENT: No OP obstrucion Head: Normocephalic.  Cardiovascular: Normal rate and regular rhythm.  Respiratory: Effort normal, non-labored breathing GI: Soft.  No distension. There is no tenderness.  Skin: WDI  Neuro: Mental Status: Patient is awake, alert, oriented to person, place, month and year.  Speech shows no dysarthria, or aphasia.  Able to follow complex commands with no difficulties.  Able to give very detailed history. Her cognitive functioning as evidenced by her vocabulary and understanding of  medical terminology is significantly above average.  Cranial Nerves: II: Visual Fields are full. PERRL.  III,IV, VI: EOMI without ptosis or diplopia.  V: Facial sensation is symmetric to temperature VII: Facial movement is symmetric.  VIII: hearing is intact to voice X: Palate elevates symmetrically XI: Shoulder shrug is symmetric. XII: tongue is midline without atrophy or fasciculations.  Motor: Tone is normal. Bulk is normal. 5/5 strength was present in all four extremities.  Sensory: Sensation is symmetric to light touch and temperature in the arms and legs. Deep Tendon Reflexes: Depressed in bilateral upper extremities.  2+ ankle jerks 2+ knee jerks bilaterally. Plantars: Toes are downgoing bilaterally.  Cerebellar: FNF and HKS are intact bilaterally  Labs I have reviewed labs in epic and the results pertinent to this consultation are:   CBC    Component Value Date/Time   WBC 12.1 (H) 08/29/2019 0549   RBC 4.54 08/29/2019 0549   HGB 13.6 08/29/2019 0549   HGB 15.3 06/19/2019 1149   HCT 41.3 08/29/2019 0549   HCT 44.0 06/19/2019 1149   PLT 211 08/29/2019 0549   PLT 271 06/19/2019 1149   MCV 91.0 08/29/2019 0549   MCV 87 06/19/2019 1149   MCH 30.0 08/29/2019 0549   MCHC 32.9 08/29/2019 0549   RDW 13.7 08/29/2019 0549   RDW 13.0 06/19/2019 1149   LYMPHSABS 2.1 08/28/2019 1358   MONOABS 0.7 08/28/2019 1358   EOSABS 0.1 08/28/2019 1358   BASOSABS 0.1 08/28/2019 1358    CMP     Component Value Date/Time   NA 141 08/29/2019 0549   NA 141 07/02/2019 1158   K 3.9 08/29/2019 0549   CL 109 08/29/2019 0549   CO2 18 (L) 08/29/2019 0549   GLUCOSE 96 08/29/2019 0549   BUN 14 08/29/2019 0549   BUN 16 07/02/2019 1158   CREATININE 0.81 08/29/2019 0549   CALCIUM 8.8 (L) 08/29/2019 0549   PROT 7.6 08/28/2019 1358   PROT 7.3 07/02/2019 1158   ALBUMIN 4.2 08/28/2019 1358   ALBUMIN 4.5 07/02/2019 1158   AST 26 08/28/2019 1358   ALT 39 08/28/2019 1358   ALKPHOS 67  08/28/2019 1358   BILITOT 1.1 08/28/2019 1358   BILITOT 0.3 07/02/2019 1158   GFRNONAA >60 08/29/2019 0549   GFRAA >60 08/29/2019 0549    Lipid Panel     Component Value Date/Time   CHOL 201 (H) 07/02/2019 1158   TRIG 102 07/02/2019 1158   HDL 48 07/02/2019 1158   LDLCALC 135 (H) 07/02/2019 1158     Imaging I have reviewed the images obtained:  CT venogram head-negative CT venogram.  No evidence  of dural sinus thrombosis or other abnormality.  MRI examination of the brain-no acute abnormality.  Scattered small deep white matter hyperintensities bilateral.  Findings are most consistent with chronic microvascular ischemia or complex migraine headaches. No change from the recent study performed in April.   Pearline Yerby PA-C [facility] [phone] 08/29/2019, 8:41 AM     Assessment:  44 year old female with chronic daily headache with migrainous features and intermittent focal neurological deficits suggestive of complicated migraine since Feb 15. She has had an extensive work-up by Dr. Jaynee Eagles, her outpatient neurologist, including LP and imaging studies, as well as trials of several medications.  Patient was sent to [facility] for further evaluation including MRI of head with and without contrast, CT venogram, and a second LP to re-assess the patient's opening pressure as well as for CNS inflammatory markers.   1. On current exam the patient shows no neurological abnormalities. 2. CTV: Negative CT venogram.  No evidence of dural sinus thrombosis or other abnormality 3. MRI brain: No acute abnormality.  Scattered small deep white matter hyperintensities bilateral.  Findings are most consistent with chronic microvascular ischemia or complex migraine headaches. No change from the recent study performed in April.  4. LP under fluoro is pending. Labs to include IgG index, oligoclonal bands, cryptococcal antigen, cell count with differential, protein and glucose.  5. Most  likely etiology for her persistent headaches is status migrainosus with features of complicated migraine. Overlapping IIH is also possible, but felt to be less likely. Venous sinus thrombosis has been ruled out with MRI brain and CTV.   Recommendations: -Lumbar puncture under fluoroscopy to evaluate opening pressure. Labs to include IgG index, oligoclonal bands, cryptococcal antigen, cell count with differential, protein and glucose.  -Control headache at this point with acute abortive migraine medications. Phenergan 25 mg IV x 1 has been ordered. Will possibly consider DHE-this will be a decision made later after patient's lumbar puncture.  I have seen and examined the patient. Ii have formulated the assessment and recommendations. 44 year old female presenting with intractable headaches. Exam is nonfocal. MRI brain without acute abnormalities. CTV shows no evidence for dural venous sinus thrombosis. Obtaining LP under fluoroscopy to evaluate opening pressure. CSF labs to include IgG index, oligoclonal bands, cryptococcal antigen, cell count with differential, protein and glucose. Phenergan 25 mg IV x 1 has been ordered.  Electronically signed: Dr. Kerney Elbe

## 2019-08-29 NOTE — Telephone Encounter (Addendum)
Called [facility] and patient was scheduled with Dr. Jamison Oka on 06/27/2019. Per [facility] patient cx on her my chart. I relayed to patient she needed to go to [facility] or [facility] per referral . I asked patient if I could conference call and we could get her scheduled now for apt.   [facility] offered her apt for this coming Friday and she relayed she could not go because she was in the hospital.  Patient has now been rescheduled for May the 14 th at 1:00 with Dr. Jamison Oka . Patient has all instructions on where she needs to go time and location made patient aware to keep her  Apt . With [facility] .  Dr. Lucia Gaskins exhausted  all her resources .

## 2019-08-29 NOTE — Telephone Encounter (Signed)
Noted thanks °

## 2019-08-30 LAB — IGG CSF INDEX
Albumin CSF-mCnc: 41 mg/dL (ref 11–48)
Albumin: 4 g/dL (ref 3.8–4.8)
CSF IgG Index: 0.5 (ref 0.0–0.7)
IgG (Immunoglobin G), Serum: 1056 mg/dL (ref 586–1602)
IgG, CSF: 5.3 mg/dL (ref 0.0–8.6)
IgG/Alb Ratio, CSF: 0.13 (ref 0.00–0.25)

## 2019-08-30 LAB — TOPIRAMATE LEVEL: Topiramate Lvl: 5.3 ug/mL (ref 2.0–25.0)

## 2019-08-30 MED ORDER — PROMETHAZINE HCL 50 MG PO TABS
25.0000 mg | ORAL_TABLET | Freq: Three times a day (TID) | ORAL | 0 refills | Status: DC | PRN
Start: 2019-08-30 — End: 2020-02-18

## 2019-08-30 MED ORDER — MAGNESIUM SULFATE IN D5W 1-5 GM/100ML-% IV SOLN
1.0000 g | Freq: Once | INTRAVENOUS | Status: AC
  Administered 2019-08-30: 1 g via INTRAVENOUS
  Filled 2019-08-30 (×2): qty 100

## 2019-08-30 MED ORDER — MECLIZINE HCL 12.5 MG PO TABS
25.0000 mg | ORAL_TABLET | Freq: Two times a day (BID) | ORAL | Status: DC | PRN
  Administered 2019-08-30: 25 mg via ORAL
  Filled 2019-08-30: qty 2

## 2019-08-30 MED ORDER — PROMETHAZINE HCL 25 MG/ML IJ SOLN: 25.0000 mg | Freq: Four times a day (QID) | INTRAMUSCULAR | Status: DC | PRN

## 2019-08-30 NOTE — Discharge Summary (Signed)
Physician Discharge Summary  Shelley Ryan NGE:[id number] DOB: 11-02-1975 DOA: 08/28/2019  PCP: Sigmund Hazel, MD  Admit date: 08/28/2019 Discharge date: 08/30/2019  Admitted From: home Disposition:  Home  Recommendations for Outpatient Follow-up:  1. Follow up with Neurology next weeks   Home Health:no Equipment/Devices:None  Discharge Condition: Stable CODE STATUS:Full Diet recommendation: Heart Healthy    Brief/Interim Summary: 44 y.o. 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  As per patient a lumbar puncture done 2 months ago showing opening pressure around 220 mmHg.  And was treated empirically with Diamox since then symptoms improved with lumbar puncture  Discharge Diagnoses:  Principal Problem:   Headache Active Problems:   PCOS (polycystic ovarian syndrome)   GERD (gastroesophageal reflux disease)   Essential hypertension  Intractable headaches: Imaging study showed no central venous thrombosis. Neurology was consulted and recommended a lumbar puncture but showed opening pressure within normal limits 17 cm of water. Patient remained afebrile with no leukocytosis protein and glucose were unremarkable other studies cryptococcal antigen oligoclonal banding to be follow-up by her neurologist. 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 She was treated with Phenergan in the hospital which control her breakthrough headache she was continue on Topamax. I will be careful in giving her Phenergan as there is a concern about malingering about her presentation. Her primary neurologist has been informed.  She was continue Topamax. Again about how she  going to get to work with her headache not be a 0 I did explain to her that her headache might never be a 0 a week and make it from 2-4 where she could go and continue with her daily activities that would be the goal.  Essential hypertension: Changes made to her medication.  Discharge Instructions  Discharge Instructions    Diet - low sodium heart healthy   Complete by: As directed    Increase activity slowly   Complete by: As directed      Allergies as of 08/30/2019      Reactions   Cisapride Anaphylaxis   Clarithromycin Nausea And Vomiting, Other (See Comments)   Acute renal failure, also   Estrogens Other (See Comments)   Patient states she has a stroke and Blood clots   Tape Rash, Other (See Comments)   Can only use paper tape- NO PLASTIC/CLEAR TAPE!!!!   Cholestyramine Nausea And Vomiting   Ciprofloxacin Hives, Rash   Keflex [cephalexin] Nausea And Vomiting, Other (See Comments)   Tolerates rocephin   Metronidazole Diarrhea, Nausea And Vomiting      Minocycline Hcl Nausea And Vomiting, Other (See Comments)   Caused MIGRAINES   Tetracyclines & Related Nausea And Vomiting, Other (See Comments)   Caused MIGRAINES, also   Unasyn [ampicillin-sulbactam Sodium] Nausea And Vomiting   Ampicillin Diarrhea      Codeine Itching, Rash, Other (See Comments)   Can tolerate Tramadol   Metoclopramide Other (See Comments)   Made me "not act like myself"      Medication List    STOP taking these medications   amitriptyline 25 MG tablet Commonly known as: ELAVIL   ondansetron 4 MG disintegrating tablet Commonly known as: ZOFRAN-ODT  TAKE these medications   acetaminophen 325 MG tablet Commonly known as: TYLENOL Take 650 mg by mouth every 6 (six) hours as needed for mild pain.   Ajovy 225 MG/1.5ML Soaj Generic drug: Fremanezumab-vfrm Inject 225 mg into the skin every 30 (thirty) days.   famotidine 40 MG tablet Commonly known as: PEPCID Take 40 mg by mouth 2 (two) times  daily.   hydroxypropyl methylcellulose / hypromellose 2.5 % ophthalmic solution Commonly known as: ISOPTO TEARS / GONIOVISC Place 1 drop into both eyes daily as needed for dry eyes.   losartan 50 MG tablet Commonly known as: COZAAR Take 0.5 tablets (25 mg total) by mouth daily. What changed: how much to take   metFORMIN 500 MG tablet Commonly known as: GLUCOPHAGE Take 500 mg by mouth 2 (two) times a day.   multivitamin with minerals tablet Take 1 tablet by mouth daily.   promethazine 50 MG tablet Commonly known as: PHENERGAN Take 0.5 tablets (25 mg total) by mouth every 8 (eight) hours as needed for up to 7 days for nausea or vomiting.   rizatriptan 10 MG disintegrating tablet Commonly known as: MAXALT-MLT Take 1 tablet (10 mg total) by mouth as needed for migraine. May repeat in 2 hours if needed   topiramate 100 MG tablet Commonly known as: TOPAMAX Take 1 tablet (100 mg total) by mouth 2 (two) times daily.   Ubrelvy 50 MG Tabs Generic drug: Ubrogepant Take 50 mg by mouth as needed. At the onset of migraine- can repeat in 2 hours if needed What changed:   when to take this  additional instructions   Vitamin D (Ergocalciferol) 1.25 MG (50000 UNIT) Caps capsule Commonly known as: DRISDOL Take 1 capsule (50,000 Units total) by mouth every 7 (seven) days. What changed: when to take this       Allergies  Allergen Reactions  . Cisapride Anaphylaxis  . Clarithromycin Nausea And Vomiting and Other (See Comments)    Acute renal failure, also  . Estrogens Other (See Comments)    Patient states she has a stroke and Blood clots  . Tape Rash and Other (See Comments)    Can only use paper tape- NO PLASTIC/CLEAR TAPE!!!!  . Cholestyramine Nausea And Vomiting  . Ciprofloxacin Hives and Rash  . Keflex [Cephalexin] Nausea And Vomiting and Other (See Comments)    Tolerates rocephin  . Metronidazole Diarrhea and Nausea And Vomiting       . Minocycline Hcl Nausea And  Vomiting and Other (See Comments)    Caused MIGRAINES  . Tetracyclines & Related Nausea And Vomiting and Other (See Comments)    Caused MIGRAINES, also  . Unasyn [Ampicillin-Sulbactam Sodium] Nausea And Vomiting  . Ampicillin Diarrhea       . Codeine Itching, Rash and Other (See Comments)    Can tolerate Tramadol  . Metoclopramide Other (See Comments)    Made me "not act like myself"    Consltations:  Neurologist   Procedures/Studies: MR BRAIN WO CONTRAST  Result Date: 08/08/2019 CLINICAL DATA:  Code stroke follow-up, history of idiopathic intracranial hypertension EXAM: MRI HEAD WITHOUT CONTRAST TECHNIQUE: Multiplanar, multiecho pulse sequences of the brain and surrounding structures were obtained without intravenous contrast. COMPARISON:  06/11/2019 FINDINGS: Brain: There is no acute infarction or intracranial hemorrhage. There is no intracranial mass, mass effect, or edema. There is no hydrocephalus or extra-axial fluid collection. Patchy small foci of T2 hyperintensity in the supratentorial white matter are nonspecific but may reflect mild chronic microvascular ischemic changes.  Ventricles and sulci are normal in size and configuration. Vascular: Major vessel flow voids at the skull base are preserved. Skull and upper cervical spine: Normal marrow signal is preserved. Sinuses/Orbits: Paranasal sinuses are aerated. Orbits are unremarkable. Other: Sella is partially empty.  Mastoid air cells are clear. IMPRESSION: No evidence of recent infarction, hemorrhage, or mass. Mild chronic microvascular ischemic changes. Partially empty sella; nonspecific but can be seen in the setting of idiopathic intracranial hypertension. Electronically Signed   By: Guadlupe Spanish M.D.   On: 08/08/2019 19:36   MR Brain W and Wo Contrast  Result Date: 08/28/2019 CLINICAL DATA:  Headache. Tinnitus dizziness vision changes vomiting and left hand tingling. EXAM: MRI HEAD WITHOUT AND WITH CONTRAST TECHNIQUE:  Multiplanar, multiecho pulse sequences of the brain and surrounding structures were obtained without and with intravenous contrast. CONTRAST:  10mL GADAVIST GADOBUTROL 1 MMOL/ML IV SOLN COMPARISON:  MRI head 08/08/2019 FINDINGS: Brain: Negative for acute infarct. Scattered small deep white matter hyperintensities bilaterally. Brainstem and cerebellum normal. Negative for hemorrhage or mass. Normal enhancement postcontrast administration. Ventricle size is normal. Vascular: Normal arterial flow voids Skull and upper cervical spine: Negative Sinuses/Orbits: Negative Other: None IMPRESSION: No acute abnormality. Scattered small deep white matter hyperintensities bilaterally. Findings are most consistent with chronic microvascular ischemia or complex migraine headaches. No change from the recent study. Electronically Signed   By: Marlan Palau M.D.   On: 08/28/2019 20:23   US BREAST LTD UNI RIGHT INC AXILLA  Result Date: 08/09/2019 CLINICAL DATA:  Patient returns today to evaluate a RIGHT breast asymmetry questioned on recent screening mammogram. EXAM: DIGITAL DIAGNOSTIC RIGHT MAMMOGRAM WITH CAD AND TOMO ULTRASOUND RIGHT BREAST COMPARISON:  Previous exams including recent screening mammogram dated 07/25/2019 ACR Breast Density Category b: There are scattered areas of fibroglandular density. FINDINGS: On today's additional diagnostic views, including spot compression with 3D tomosynthesis, there is a developing asymmetry confirmed within the lower inner quadrant of the RIGHT breast, at middle to posterior depth. Mammographic images were processed with CAD. Targeted ultrasound is performed, showing no definite sonographic correlate for the developing asymmetry seen on mammogram. There is an [place] of normal dense fibroglandular tissue in the RIGHT breast at the 2:30 o'clock axis, which may be a correlate for the mammographic finding but is not a definitive correlate. RIGHT axilla was evaluated with ultrasound showing  no enlarged or morphologically abnormal lymph nodes. IMPRESSION: Developing asymmetry within the inner RIGHT breast, at middle to posterior depth, without definite sonographic correlate, best seen on CC views. 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 Recommend stereotactic biopsy to exclude malignancy. RECOMMENDATION: Stereotactic biopsy for the developing asymmetry within the inner RIGHT breast. Stereotactic biopsy is scheduled for April 21st. I have discussed the findings and recommendations with the patient. If applicable, a reminder letter will be sent to the patient regarding the next appointment. BI-RADS CATEGORY  4: Suspicious. Electronically Signed   By: Bary Richard M.D.   On: 08/09/2019 12:20   MM DIAG BREAST TOMO UNI RIGHT  Result Date: 08/09/2019 CLINICAL DATA:  Patient returns today to evaluate a RIGHT breast asymmetry questioned on recent screening mammogram. EXAM: DIGITAL DIAGNOSTIC RIGHT MAMMOGRAM WITH CAD AND TOMO ULTRASOUND RIGHT BREAST COMPARISON:  Previous exams including recent screening mammogram dated 07/25/2019 ACR Breast Density Category b: There are scattered areas of fibroglandular density. FINDINGS: On today's additional diagnostic views, including spot compression with 3D tomosynthesis, there is a developing asymmetry confirmed within the lower inner quadrant of the RIGHT breast, at middle to posterior  depth. Mammographic images were processed with CAD. Targeted ultrasound is performed, showing no definite sonographic correlate for the developing asymmetry seen on mammogram. There is an [place] of normal dense fibroglandular tissue in the RIGHT breast at the 2:30 o'clock axis, which may be a correlate for the mammographic finding but is not a definitive correlate. RIGHT axilla was evaluated with ultrasound showing no enlarged or morphologically abnormal lymph nodes. IMPRESSION: Developing asymmetry within the inner RIGHT breast, at middle to posterior depth, without definite sonographic correlate,  best seen on CC views. 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 Recommend stereotactic biopsy to exclude malignancy. RECOMMENDATION: Stereotactic biopsy for the developing asymmetry within the inner RIGHT breast. Stereotactic biopsy is scheduled for April 21st. I have discussed the findings and recommendations with the patient. If applicable, a reminder letter will be sent to the patient regarding the next appointment. BI-RADS CATEGORY  4: Suspicious. Electronically Signed   By: Bary Richard M.D.   On: 08/09/2019 12:20   CT VENOGRAM HEAD  Result Date: 08/28/2019 CLINICAL DATA:  Initial evaluation for persistent headache with nausea, vomiting, visual changes, tinnitus. EXAM: CT VENOGRAM HEAD TECHNIQUE: Dedicated CT venogram images of the head was performed using the standard protocol. CONTRAST:  65mL OMNIPAQUE IOHEXOL 350 MG/ML SOLN COMPARISON:  Concomitant MRI of the brain performed on the same day. FINDINGS: Brain: Cerebral volume within normal limits for patient age. Few scattered hypodensities involving the supratentorial cerebral white matter noted, nonspecific, better evaluated on prior brain MRI. No evidence for acute intracranial hemorrhage. No findings to suggest acute large vessel territory infarct. No mass lesion, midline shift, or mass effect. Ventricles are normal in size without evidence for hydrocephalus. No extra-axial fluid collection identified. Vascular: No hyperdense vessel identified on precontrast imaging. Following contrast administration, normal opacification of the superior sagittal sinus to the level of the torcula is seen. Torcula itself is patent. Transverse and sigmoid sinuses are patent as are the partially visualized proximal internal jugular veins. Straight sinus, vein of Galen, internal cerebral veins, and basal veins of Rosenthal patent. No evidence for dural sinus thrombosis. No appreciable dural sinus stenosis. Skull: Scalp soft tissues demonstrate no acute abnormality. Calvarium intact.  Sinuses/Orbits: Globes and orbital soft tissues within normal limits. Visualized paranasal sinuses are clear. No mastoid effusion. IMPRESSION: Negative CT venogram. No evidence for dural sinus thrombosis or other abnormality. Electronically Signed   By: Rise Mu M.D.   On: 08/28/2019 22:47   MM CLIP PLACEMENT RIGHT  Result Date: 08/15/2019 CLINICAL DATA:  Status post stereotactic guided core needle biopsy right breast asymmetry. EXAM: DIAGNOSTIC RIGHT MAMMOGRAM POST STEREOTACTIC BIOPSY COMPARISON:  Previous exam(s). FINDINGS: Mammographic images were obtained following stereotactic guided biopsy of right breast asymmetry. The biopsy marking clip is in expected position at the site of biopsy. IMPRESSION: Appropriate positioning of the coil shaped biopsy marking clip at the site of biopsy in the medial right breast at the site of asymmetry. Final Assessment: Post Procedure Mammograms for Marker Placement Electronically Signed   By: Annia Belt M.D.   On: 08/15/2019 08:16   MM RT BREAST BX W LOC DEV 1ST LESION IMAGE BX SPEC STEREO GUIDE  Addendum Date: 08/16/2019   ADDENDUM REPORT: 08/16/2019 12:46 ADDENDUM: Pathology revealed PSEUDOANGIOMATOUS STROMAL HYPERPLASIA, FIBROCYSTIC CHANGES of the Right breast, medial. This was found to be concordant by Dr. Annia Belt. Pathology results were discussed with the patient by telephone. The patient reported doing well after the biopsy with tenderness and bruising at the site. Post biopsy instructions and  care were reviewed and questions were answered. The patient was encouraged to call [facility] for any additional concerns. The patient was instructed to return for annual screening mammography and informed a reminder notice would be sent regarding this appointment. Pathology results reported by Rene Kocher, RN on 08/16/2019. Electronically Signed   By: Annia Belt M.D.   On: 08/16/2019 12:46   Result Date: 08/16/2019 CLINICAL  DATA:  Patient with indeterminate sonographically occult right breast asymmetry. EXAM: RIGHT BREAST STEREOTACTIC CORE NEEDLE BIOPSY COMPARISON:  Previous exams. FINDINGS: The patient and I discussed the procedure of stereotactic-guided biopsy including benefits and alternatives. We discussed the high likelihood of a successful procedure. We discussed the risks of the procedure including infection, bleeding, tissue injury, clip migration, and inadequate sampling. Informed written consent was given. The usual time out protocol was performed immediately prior to the procedure. Using sterile technique and 1% Lidocaine as local anesthetic, under stereotactic guidance, a 9 gauge vacuum assisted device was used to perform core needle biopsy of asymmetry within the medial right breast using a cranial approach. Lesion quadrant: Upper inner quadrant At the conclusion of the procedure, coil shaped tissue marker clip was deployed into the biopsy cavity. Follow-up 2-view mammogram was performed and dictated separately. IMPRESSION: Stereotactic-guided biopsy of right breast asymmetry. No apparent complications. Electronically Signed: By: Annia Belt M.D. On: 08/15/2019 08:15   CT HEAD CODE STROKE WO CONTRAST  Result Date: 08/08/2019 CLINICAL DATA:  Code stroke. 44 year old female with left leg weakness, last known well 1615 hours. EXAM: CT HEAD WITHOUT CONTRAST TECHNIQUE: Contiguous axial images were obtained from the base of the skull through the vertex without intravenous contrast. COMPARISON:  Head CT 07/29/2019 and earlier. FINDINGS: Brain: Cerebral volume is within normal limits. Gray-white matter differentiation appears stable and within normal limits. No midline shift, ventriculomegaly, mass effect, evidence of mass lesion, intracranial hemorrhage or evidence of cortically based acute infarction. Partially empty sella again noted. Vascular: No suspicious intracranial vascular hyperdensity. Skull: Stable, negative.  Sinuses/Orbits: Visualized paranasal sinuses and mastoids are stable and well pneumatized. Other: Negative orbit and scalp soft tissues. ASPECTS [facility] Stroke Program Early CT Score) Total score (0-10 with 10 being normal): 10 IMPRESSION: 1. Stable and negative noncontrast CT appearance of the brain. 2. ASPECTS 10. 3. These results were communicated to Dr. Amada Jupiter at 6:12 pm on 08/08/2019 by text page via the [facility] messaging system. Electronically Signed   By: Odessa Fleming M.D.   On: 08/08/2019 18:12   DG FL GUIDED LUMBAR PUNCTURE  Result Date: 08/29/2019 Guadlupe Spanish, MD     08/29/2019  1:56 PM Lumbar Puncture Procedure Note Pre-operative Diagnosis: Headache Post-operative Diagnosis: Headache Indications: Diagnostic Procedure Details Consent: Informed consent was obtained. Risks of the procedure were discussed including: infection, bleeding, pain and headache. The patient was positioned under sterile conditions. Betadine solution and sterile drapes were utilized. A spinal needle was inserted at the L2 - L3 interspace. Opening pressure was measured. Spinal fluid was obtained and sent to the laboratory. Findings See dictation in PACS Complications:  None; patient tolerated the procedure well.       Condition: stable Plan Bed rest for 2 hours. Tylenol 650 mg for pain.      Subjective: No new complaints she relates her pain is about 4.  Discharge Exam: Vitals:   08/30/19 0437 08/30/19 0753  BP: 121/89 115/74  Pulse: 82 85  Resp: 18 18  Temp: 98.2 F (36.8 C) 98.3 F (36.8  C)  SpO2: 98% 99%   Vitals:   08/29/19 2052 08/30/19 0018 08/30/19 0437 08/30/19 0753  BP: 119/84 120/81 121/89 115/74  Pulse: 82 87 82 85  Resp: Temp: 98.1 F (36.7 C) 98.2 F (36.8 C) 98.2 F (36.8 C) 98.3 F (36.8 C)  TempSrc: Oral Oral Oral Oral  SpO2: 100% 96% 98% 99%  Weight:      Height:        General: Pt is alert, awake, not in acute distress Cardiovascular: RRR, S1/S2 +, no rubs, no  gallops Respiratory: CTA bilaterally, no wheezing, no rhonchi Abdominal: Soft, NT, ND, bowel sounds + Extremities: no edema, no cyanosis    The results of significant diagnostics from this hospitalization (including imaging, microbiology, ancillary and laboratory) are listed below for reference.     Microbiology: Recent Results (from the past 240 hour(s))  Respiratory Panel by RT PCR (Flu A&B, Covid) - Nasopharyngeal Swab     Status: None   Collection Time: 08/29/19 12:44 AM   Specimen: Nasopharyngeal Swab  Result Value Ref Range Status   SARS Coronavirus 2 by RT PCR NEGATIVE NEGATIVE Final    Comment: (NOTE) SARS-CoV-2 target nucleic acids are NOT DETECTED. The SARS-CoV-2 RNA is generally detectable in upper respiratoy specimens during the acute phase of infection. The lowest concentration of SARS-CoV-2 viral copies this assay can detect is 131 copies/mL. A negative result does not preclude SARS-Cov-2 infection and should not be used as the sole basis for treatment or other patient management decisions. A negative result may occur with  improper specimen collection/handling, submission of specimen other than nasopharyngeal swab, presence of viral mutation(s) within the areas targeted by this assay, and inadequate number of viral copies (<131 copies/mL). A negative result must be combined with clinical observations, patient history, and epidemiological information. The expected result is Negative. Fact Sheet for Patients:  https://www.moore.com/ Fact Sheet for Healthcare Providers:  https://www.young.biz/ This test is not yet ap proved or cleared by the [place] FDA and  has been authorized for detection and/or diagnosis of SARS-CoV-2 by FDA under an Emergency Use Authorization (EUA). This EUA will remain  in effect (meaning this test can be used) for the duration of the COVID-19 declaration under Section 564(b)(1) of the Act, 21  U.S.C. section 360bbb-3(b)(1), unless the authorization is terminated or revoked sooner.    Influenza A by PCR NEGATIVE NEGATIVE Final   Influenza B by PCR NEGATIVE NEGATIVE Final    Comment: (NOTE) The Xpert Xpress SARS-CoV-2/FLU/RSV assay is intended as an aid in  the diagnosis of influenza from Nasopharyngeal swab specimens and  should not be used as a sole basis for treatment. Nasal washings and  aspirates are unacceptable for Xpert Xpress SARS-CoV-2/FLU/RSV  testing. Fact Sheet for Patients: https://www.moore.com/ Fact Sheet for Healthcare Providers: https://www.young.biz/ This test is not yet approved or cleared by the [place] FDA and  has been authorized for detection and/or diagnosis of SARS-CoV-2 by  FDA under an Emergency Use Authorization (EUA). This EUA will remain  in effect (meaning this test can be used) for the duration of the  Covid-19 declaration under Section 564(b)(1) of the Act, 21  U.S.C. section 360bbb-3(b)(1), unless the authorization is  terminated or revoked. Performed at [facility], [address], [place], [place] [address]   CSF culture     Status: None (Preliminary result)   Collection Time: 08/29/19  1:45 PM   Specimen: PATH Cytology CSF; Cerebrospinal Fluid  Result Value Ref Range Status   Specimen Description CYTO CSF  Final   Special Requests NONE  Final   Gram Stain   Final    WBC PRESENT,BOTH PMN AND MONONUCLEAR NO ORGANISMS SEEN CYTOSPIN SMEAR    Culture   Final    NO GROWTH < 24 HOURS Performed at [facility], [address] [place], [place] [id number]    Report Status PENDING  Incomplete     Labs: BNP (last 3 results) No results for input(s): BNP in the last 8760 hours. Basic Metabolic Panel: Recent Labs  Lab 08/28/19 1358 08/28/19 1422 08/29/19 0549  NA 139 140 141  K 3.8 3.8 3.9  CL 110 109 109  CO2 18*  --  18*  GLUCOSE 113* 113* 96  BUN 13 16 14   CREATININE  1.10* 0.90 0.81  CALCIUM 9.3  --  8.8*   Liver Function Tests: Recent Labs  Lab 08/28/19 1358  AST 26  ALT 39  ALKPHOS 67  BILITOT 1.1  PROT 7.6  ALBUMIN 4.2   No results for input(s): LIPASE, AMYLASE in the last 168 hours. No results for input(s): AMMONIA in the last 168 hours. CBC: Recent Labs  Lab 08/28/19 1358 08/28/19 1422 08/29/19 0549  WBC 15.3*  --  12.1*  NEUTROABS 12.3*  --   --   HGB 15.4* 16.0* 13.6  HCT 46.7* 47.0* 41.3  MCV 90.9  --  91.0  PLT 259  --  211   Cardiac Enzymes: No results for input(s): CKTOTAL, CKMB, CKMBINDEX, TROPONINI in the last 168 hours. BNP: Invalid input(s): POCBNP CBG: No results for input(s): GLUCAP in the last 168 hours. D-Dimer No results for input(s): DDIMER in the last 72 hours. Hgb A1c No results for input(s): HGBA1C in the last 72 hours. Lipid Profile No results for input(s): CHOL, HDL, LDLCALC, TRIG, CHOLHDL, LDLDIRECT in the last 72 hours. Thyroid function studies No results for input(s): TSH, T4TOTAL, T3FREE, THYROIDAB in the last 72 hours.  Invalid input(s): FREET3 Anemia work up No results for input(s): VITAMINB12, FOLATE, FERRITIN, TIBC, IRON, RETICCTPCT in the last 72 hours. Urinalysis    Component Value Date/Time   COLORURINE YELLOW 06/11/2019 2000   APPEARANCEUR CLEAR 06/11/2019 2000   LABSPEC 1.021 06/11/2019 2000   PHURINE 5.0 06/11/2019 2000   GLUCOSEU NEGATIVE 06/11/2019 2000   HGBUR SMALL (A) 06/11/2019 2000   BILIRUBINUR NEGATIVE 06/11/2019 2000   KETONESUR NEGATIVE 06/11/2019 2000   PROTEINUR NEGATIVE 06/11/2019 2000   UROBILINOGEN 0.2 10/08/2013 1510   NITRITE NEGATIVE 06/11/2019 2000   LEUKOCYTESUR NEGATIVE 06/11/2019 2000   Sepsis Labs Invalid input(s): PROCALCITONIN,  WBC,  LACTICIDVEN Microbiology Recent Results (from the past 240 hour(s))  Respiratory Panel by RT PCR (Flu A&B, Covid) - Nasopharyngeal Swab     Status: None   Collection Time: 08/29/19 12:44 AM   Specimen:  Nasopharyngeal Swab  Result Value Ref Range Status   SARS Coronavirus 2 by RT PCR NEGATIVE NEGATIVE Final    Comment: (NOTE) SARS-CoV-2 target nucleic acids are NOT DETECTED. The SARS-CoV-2 RNA is generally detectable in upper respiratoy specimens during the acute phase of infection. The lowest concentration of SARS-CoV-2 viral copies this assay can detect is 131 copies/mL. A negative result does not preclude SARS-Cov-2 infection and should not be used as the sole basis for treatment or other patient management decisions. A negative result may occur with  improper specimen collection/handling, submission of specimen other than nasopharyngeal swab, presence of viral mutation(s)  within the areas targeted by this assay, and inadequate number of viral copies (<131 copies/mL). A negative result must be combined with clinical observations, patient history, and epidemiological information. The expected result is Negative. Fact Sheet for Patients:  PinkCheek.be Fact Sheet for Healthcare Providers:  GravelBags.it This test is not yet ap proved or cleared by the [place] FDA and  has been authorized for detection and/or diagnosis of SARS-CoV-2 by FDA under an Emergency Use Authorization (EUA). This EUA will remain  in effect (meaning this test can be used) for the duration of the COVID-19 declaration under Section 564(b)(1) of the Act, 21 U.S.C. section 360bbb-3(b)(1), unless the authorization is terminated or revoked sooner.    Influenza A by PCR NEGATIVE NEGATIVE Final   Influenza B by PCR NEGATIVE NEGATIVE Final    Comment: (NOTE) The Xpert Xpress SARS-CoV-2/FLU/RSV assay is intended as an aid in  the diagnosis of influenza from Nasopharyngeal swab specimens and  should not be used as a sole basis for treatment. Nasal washings and  aspirates are unacceptable for Xpert Xpress SARS-CoV-2/FLU/RSV  testing. Fact Sheet for  Patients: PinkCheek.be Fact Sheet for Healthcare Providers: GravelBags.it This test is not yet approved or cleared by the [place] FDA and  has been authorized for detection and/or diagnosis of SARS-CoV-2 by  FDA under an Emergency Use Authorization (EUA). This EUA will remain  in effect (meaning this test can be used) for the duration of the  Covid-19 declaration under Section 564(b)(1) of the Act, 21  U.S.C. section 360bbb-3(b)(1), unless the authorization is  terminated or revoked. Performed at [facility], [place] [address], [place], [place] [address]   CSF culture     Status: None (Preliminary result)   Collection Time: 08/29/19  1:45 PM   Specimen: PATH Cytology CSF; Cerebrospinal Fluid  Result Value Ref Range Status   Specimen Description CYTO CSF  Final   Special Requests NONE  Final   Gram Stain   Final    WBC PRESENT,BOTH PMN AND MONONUCLEAR NO ORGANISMS SEEN CYTOSPIN SMEAR    Culture   Final    NO GROWTH < 24 HOURS Performed at [facility], [place] [address] [place], [place] [address]    Report Status PENDING  Incomplete     Time coordinating discharge: Over 30 minutes  SIGNED:   Charlynne Cousins, MD  [facility] 08/30/2019, 10:56 AM Pager   If 7PM-7AM, please contact night-coverage [email or web address] Password [email or web address]

## 2019-08-30 NOTE — Progress Notes (Addendum)
NEUROLOGY PROGRESS NOTE   Subjective: At this time patient states that she is a 5/10 headache located over her left frontal forehead.  She does endorse some dizziness.  Other than that she is no other complaints.  Exam: Vitals:   08/30/19 0437 08/30/19 0753  BP: 121/89 115/74  Pulse: 82 85  Resp: 18 18  Temp: 98.2 F (36.8 C) 98.3 F (36.8 C)  SpO2: 98% 99%    ROS General ROS: negative for -headache Ophthalmic ROS: negative for - blurry vision ENT ROS: negative for -dizziness Allergy and Immunology ROS: negative for - hives Respiratory ROS: negative for -  shortness of breath or wheezing Cardiovascular ROS: negative for - chest pain Gastrointestinal ROS: negative for - abdominal pain Genito-Urinary ROS: negative for - dysuria Musculoskeletal ROS: negative for - muscular weakness Neurological ROS: as noted in HPI Dermatological ROS: negative for  skin lesion changes   Physical Exam  Constitutional: Appears well-developed and well-nourished.  Eyes: No scleral injection HENT: No OP obstrucion Head: Normocephalic.  Cardiovascular: Normal rate and regular rhythm.  Respiratory: Effort normal, non-labored breathing GI: Soft.  No distension. There is no tenderness.  Skin: WDI   Neuro:  Mental Status: Alert, oriented, thought content appropriate.  Speech fluent without evidence of aphasia.  Able to follow 3 step commands without difficulty. Cranial Nerves: II:  Visual fields grossly normal,  III,IV, VI: ptosis not present, extra-ocular motions intact bilaterally pupils equal, round, reactive to light and accommodation V,VII: smile symmetric, facial light touch sensation normal bilaterally VIII: hearing normal bilaterally IX,X: Palate rises midline XI: bilateral shoulder shrug XII: midline tongue extension Motor: Right : Upper extremity   5/5    Left:     Upper extremity   5/5  Lower extremity   5/5     Lower extremity   5/5 Tone and bulk:normal tone throughout; no  atrophy noted Sensory: Pinprick and light touch intact throughout, bilaterally Deep Tendon Reflexes: 2+ and symmetric throughout Plantars: Right: downgoing   Left: downgoing Cerebellar: normal finger-to-nose, normal rapid alternating movements and normal heel-to-shin test Gait: normal gait and station    Medications:  Scheduled: . amitriptyline  25 mg Oral QHS  . famotidine  40 mg Oral BID  . losartan  50 mg Oral Daily  . sodium chloride flush  3 mL Intravenous Once  . topiramate  100 mg Oral BID    Pertinent Labs/Diagnostics: CSF IgG index pending, oligoclonal bands pending fungus culture pending CSF cryptococcal antigen within normal limits, culture showed no growth of organisms, color pink, appearance clear, red blood cells 1330, white blood cells 3, protein 44, glucose 59 Topamax level pending  MR Brain W and Wo Contrast  Result Date: 08/28/2019 CLINICAL DATA:  Headache. Tinnitus dizziness vision changes vomiting and left hand tingling. EXAM: MRI HEAD WITHOUT AND WITH CONTRAST TECHNIQUE: Multiplanar, multiecho pulse sequences of the brain and surrounding structures were obtained without and with intravenous contrast. CONTRAST:  72mL GADAVIST GADOBUTROL 1 MMOL/ML IV SOLN COMPARISON:  MRI head 08/08/2019 FINDINGS: Brain: Negative for acute infarct. Scattered small deep white matter hyperintensities bilaterally. Brainstem and cerebellum normal. Negative for hemorrhage or mass. Normal enhancement postcontrast administration. Ventricle size is normal. Vascular: Normal arterial flow voids Skull and upper cervical spine: Negative Sinuses/Orbits: Negative Other: None IMPRESSION: No acute abnormality. Scattered small deep white matter hyperintensities bilaterally. Findings are most consistent with chronic microvascular ischemia or complex migraine headaches. No change from the recent study. Electronically Signed   By: Marlan Palau M.D.  On: 08/28/2019 20:23   CT VENOGRAM HEAD  Result Date:  08/28/2019 CLINICAL DATA:  Initial evaluation for persistent headache with nausea, vomiting, visual changes, tinnitus. EXAM: CT VENOGRAM HEAD TECHNIQUE: Dedicated CT venogram images of the head was performed using the standard protocol. CONTRAST:  84mL OMNIPAQUE IOHEXOL 350 MG/ML SOLN COMPARISON:  Concomitant MRI of the brain performed on the same day. FINDINGS: Brain: Cerebral volume within normal limits for patient age. Few scattered hypodensities involving the supratentorial cerebral white matter noted, nonspecific, better evaluated on prior brain MRI. No evidence for acute intracranial hemorrhage. No findings to suggest acute large vessel territory infarct. No mass lesion, midline shift, or mass effect. Ventricles are normal in size without evidence for hydrocephalus. No extra-axial fluid collection identified. Vascular: No hyperdense vessel identified on precontrast imaging. Following contrast administration, normal opacification of the superior sagittal sinus to the level of the torcula is seen. Torcula itself is patent. Transverse and sigmoid sinuses are patent as are the partially visualized proximal internal jugular veins. Straight sinus, vein of Galen, internal cerebral veins, and basal veins of Rosenthal patent. No evidence for dural sinus thrombosis. No appreciable dural sinus stenosis. Skull: Scalp soft tissues demonstrate no acute abnormality. Calvarium intact. Sinuses/Orbits: Globes and orbital soft tissues within normal limits. Visualized paranasal sinuses are clear. No mastoid effusion. IMPRESSION: Negative CT venogram. No evidence for dural sinus thrombosis or other abnormality. Electronically Signed   By: Rise Mu M.D.   On: 08/28/2019 22:47   DG FL GUIDED LUMBAR PUNCTURE  Result Date: 08/29/2019 Guadlupe Spanish, MD     08/29/2019  1:56 PM Lumbar Puncture Procedure Note Pre-operative Diagnosis: Headache Post-operative Diagnosis: Headache Indications: Diagnostic Procedure Details  Consent: Informed consent was obtained. Risks of the procedure were discussed including: infection, bleeding, pain and headache. The patient was positioned under sterile conditions. Betadine solution and sterile drapes were utilized. A spinal needle was inserted at the L2 - L3 interspace. Opening pressure was measured. Spinal fluid was obtained and sent to the laboratory. Findings See dictation in PACS Complications:  None; patient tolerated the procedure well.       Condition: stable Plan Bed rest for 2 hours. Tylenol 650 mg for pain.  Opening pressure was 17 cmH2O   Betheny Suchecki PA-C [facility] [phone]  Assessment:  44 year old female presenting to the hospital secondary to having a chronic headache with questionable pseudotumor cerebri.  Thus far CSF labs normal and opening pressure was 17 cm of water which is normal.  Exam shows no localizing lateralizing abnormalities.  This most likely represents status migrainosus with features consistent with complicated migraine.  Impression: -Chronic daily headache  Recommendations: -Continue medications patient is on -At this point pseudotumor cerebri has been ruled out.  Would have patient follow-up with Dr. Lucia Gaskins -No further work-up to be done while in hospital at this time.  Neurology will sign off.   08/30/2019, 9:38 AM   Addendum: Was called back to the room talk to patient.  Although this morning she only said she was dizzy now at this point she stated that she has vertigo.  I explained to her at this point we will give her meclizine for her vertigo.  We will also have PT evaluate for safety and DME she can have for fall precautions.  I explained to patient we will give her magnesium sulfate 1 g x 1.  She states that her headache at this point is a 4-5 out of 10.  It is explained her, given the  fact that she has been dealing with this headache for such a long time, after discharge if headache does return the best thing she could  do is go to [facility] as she has an appointment for a neurologist there in June.  Is there a tertiary center they have further options they could provide.  She was understanding of this and accepting of this.  08/30/2019 at 1315

## 2019-08-30 NOTE — Progress Notes (Signed)
Discharged to home after IV access removed and discharge instructions reviewed with pt.

## 2019-08-30 NOTE — Progress Notes (Signed)
When found out she was going home, she said she did not feel comfortable going home because she still has a headache.  Dr Robb Matar spoke with her at great length and explained to her that she will not be headache free while she is here and that if her headache is a 5, it is ok for her to leave.  She is also in her room calling Doctor's offices and trying to schedule appointments for headache follow up while she is in her room and wanted me to page Dr Lucia Gaskins to see if she could get FMLA papers signed.  PA Felicie Morn came in room and spoke at length as well explaining that we will try the magnesium and if that does not help, she may need to follow up with [facility] as she already was planning to and has an appointment for.  She then said she was having dizziness but had not said anything about that before the conversation with Onalee Hua.  He did tell her that she never mentioned that and she just said "oh".  She did finally agree to leave after Magnesium and had mentioned that she could try [facility] before leaving.  Onalee Hua said that would not be something they want to do at this time but she could discuss those options when she goes for her appointment at [facility].

## 2019-08-30 NOTE — Evaluation (Signed)
Physical Therapy Evaluation and Discharge Patient Details Name: Shelley Ryan MRN: [id number] DOB: 1975-06-26 Today's Date: 08/30/2019   History of Present Illness  Pt is a 44 y/o female admitted secondary to Intractable Headache. Pt is s/p lumbar puncture. PMH includes HTN, migraines, and  dural sinus thrombosis.   Clinical Impression  Pt admitted secondary to problem above with deficits below. Pt requiring min guard to supervision for safety for gait and stair navigation this session. Did report dizziness when making turns and when standing with eyes closed. Noted increased sway with eyes closed. Discussed safety with stair navigation and use of shower chair when showering to increase safety. Pt reports husband can assist intermittently. Reports the plan is to d/c home today. No further skilled acute PT needs. Will sign off. If needs change, please-reconsult.     Follow Up Recommendations No PT follow up;Supervision for mobility/OOB (May benefit from outpatient PT once headache improves if still experiencing dizziness)    Equipment Recommendations  None recommended by PT    Recommendations for Other Services       Precautions / Restrictions Precautions Precautions: Fall Restrictions Weight Bearing Restrictions: No      Mobility  Bed Mobility Overal bed mobility: Independent                Transfers Overall transfer level: Needs assistance Equipment used: None Transfers: Sit to/from Stand Sit to Stand: Supervision         General transfer comment: Supervision for safety. Did note increased sway when standing with eyes closed.   Ambulation/Gait Ambulation/Gait assistance: Min guard;Supervision Gait Distance (Feet): 200 Feet Assistive device: None Gait Pattern/deviations: Step-through pattern;Decreased stride length Gait velocity: slower   General Gait Details: Overall steady gait when ambulating along straight path. No LOB noted. When turning, pt did  report dizziness and required standing rest.   Stairs Stairs: Yes Stairs assistance: Min guard;Supervision Stair Management: One rail Right;One rail Left;Alternating pattern;Forwards Number of Stairs: 4 General stair comments: Cautious stair navigation with use of rail. Educated about using step to pattern to increase safety if she had increased dizziness. Also educated about having assist when navigating stairs initially.   Wheelchair Mobility    Modified Rankin (Stroke Patients Only)       Balance Overall balance assessment: Mild deficits observed, not formally tested                                           Pertinent Vitals/Pain Pain Assessment: Faces Faces Pain Scale: Hurts even more Pain Location: headache Pain Descriptors / Indicators: Headache Pain Intervention(s): Limited activity within patient's tolerance;Monitored during session;Repositioned    Home Living Family/patient expects to be discharged to:: Private residence Living Arrangements: Spouse/significant other Available Help at Discharge: Family;Available PRN/intermittently Type of Home: House Home Access: Stairs to enter Entrance Stairs-Rails: Right Entrance Stairs-Number of Steps: 14 Home Layout: Two level Home Equipment: None      Prior Function Level of Independence: Independent               Hand Dominance        Extremity/Trunk Assessment   Upper Extremity Assessment Upper Extremity Assessment: LUE deficits/detail LUE Deficits / Details: Reports some tingling in L hand and mild weakness.     Lower Extremity Assessment Lower Extremity Assessment: Overall WFL for tasks assessed    Cervical / Trunk Assessment Cervical / Trunk  Assessment: Normal  Communication   Communication: No difficulties  Cognition Arousal/Alertness: Awake/alert Behavior During Therapy: WFL for tasks assessed/performed Overall Cognitive Status: Within Functional Limits for tasks assessed                                         General Comments General comments (skin integrity, edema, etc.): Discussed using shower chair for increased stability in shower, especially with dizziness.     Exercises     Assessment/Plan    PT Assessment Patent does not need any further PT services  PT Problem List         PT Treatment Interventions      PT Goals (Current goals can be found in the Care Plan section)  Acute Rehab PT Goals Patient Stated Goal: for headache to improve PT Goal Formulation: With patient Time For Goal Achievement: 08/30/19 Potential to Achieve Goals: Good    Frequency     Barriers to discharge        Co-evaluation               AM-PAC PT "6 Clicks" Mobility  Outcome Measure Help needed turning from your back to your side while in a flat bed without using bedrails?: None Help needed moving from lying on your back to sitting on the side of a flat bed without using bedrails?: None Help needed moving to and from a bed to a chair (including a wheelchair)?: None Help needed standing up from a chair using your arms (e.g., wheelchair or bedside chair)?: None Help needed to walk in hospital room?: A Little Help needed climbing 3-5 steps with a railing? : A Little 6 Click Score: 22    End of Session Equipment Utilized During Treatment: Gait belt Activity Tolerance: Patient tolerated treatment well Patient left: in bed;with call bell/phone within reach;with bed alarm set;with nursing/sitter in room;with family/visitor present Nurse Communication: Mobility status PT Visit Diagnosis: Dizziness and giddiness (R42);Pain Pain - part of body: (headache)    Time: 1157-2620 PT Time Calculation (min) (ACUTE ONLY): 20 min   Charges:   PT Evaluation $PT Eval Low Complexity: 1 Low          Lou Miner, DPT  Acute Rehabilitation Services  Pager: [phone] Office: [phone]   Rudean Hitt 08/30/2019, 3:32  PM

## 2019-08-30 NOTE — TOC Transition Note (Signed)
Transition of Care [facility]) - CM/SW Discharge Note   Patient Details  Name: Shelley Ryan MRN: [id number] Date of Birth: [birth year]  Transition [facility]) CM/SW Contact:  Kermit Balo, RN Phone Number: 08/30/2019, 11:30 AM   Clinical Narrative:    Pt discharging home with self care. Pt has hospital follow up and transportation home.    Final next level of care: Home/Self Care Barriers to Discharge: No Barriers Identified   Patient Goals and CMS Choice        Discharge Placement                       Discharge Plan and Services                                     Social Determinants of Health (SDOH) Interventions     Readmission Risk Interventions No flowsheet data found.

## 2019-08-31 LAB — OLIGOCLONAL BANDS, CSF + SERM

## 2019-09-01 LAB — CSF CULTURE W GRAM STAIN: Culture: NO GROWTH

## 2019-09-02 MED ORDER — PROCHLORPERAZINE EDISYLATE 10 MG/2ML IJ SOLN
10.00 | INTRAMUSCULAR | Status: DC
Start: ? — End: 2019-09-02

## 2019-09-03 ENCOUNTER — Encounter (INDEPENDENT_AMBULATORY_CARE_PROVIDER_SITE_OTHER): Payer: Self-pay | Admitting: Family Medicine

## 2019-09-03 ENCOUNTER — Other Ambulatory Visit: Payer: Self-pay

## 2019-09-03 ENCOUNTER — Ambulatory Visit (INDEPENDENT_AMBULATORY_CARE_PROVIDER_SITE_OTHER): Payer: BC Managed Care – PPO | Admitting: Family Medicine

## 2019-09-03 ENCOUNTER — Encounter: Payer: Self-pay | Admitting: Family Medicine

## 2019-09-03 VITALS — BP 115/77 | HR 102 | Temp 98.3°F | Ht 67.0 in | Wt 221.0 lb

## 2019-09-03 VITALS — BP 158/88 | HR 78 | Temp 97.0°F | Ht 67.0 in | Wt 225.0 lb

## 2019-09-03 DIAGNOSIS — E669 Obesity, unspecified: Secondary | ICD-10-CM | POA: Diagnosis not present

## 2019-09-03 DIAGNOSIS — Z9189 Other specified personal risk factors, not elsewhere classified: Secondary | ICD-10-CM | POA: Diagnosis not present

## 2019-09-03 DIAGNOSIS — R112 Nausea with vomiting, unspecified: Secondary | ICD-10-CM

## 2019-09-03 DIAGNOSIS — Z6834 Body mass index (BMI) 34.0-34.9, adult: Secondary | ICD-10-CM

## 2019-09-03 DIAGNOSIS — E559 Vitamin D deficiency, unspecified: Secondary | ICD-10-CM | POA: Diagnosis not present

## 2019-09-03 DIAGNOSIS — G8929 Other chronic pain: Secondary | ICD-10-CM

## 2019-09-03 DIAGNOSIS — Z86718 Personal history of other venous thrombosis and embolism: Secondary | ICD-10-CM | POA: Diagnosis not present

## 2019-09-03 DIAGNOSIS — R519 Headache, unspecified: Secondary | ICD-10-CM | POA: Diagnosis not present

## 2019-09-03 LAB — PATHOLOGIST SMEAR REVIEW

## 2019-09-03 MED ORDER — NURTEC 75 MG PO TBDP: 75.0000 mg | ORAL_TABLET | Freq: Every day | ORAL | 11 refills | Status: AC | PRN

## 2019-09-03 MED ORDER — VITAMIN D (ERGOCALCIFEROL) 1.25 MG (50000 UNIT) PO CAPS: 50000.0000 [IU] | ORAL_CAPSULE | ORAL | 0 refills | Status: DC

## 2019-09-03 NOTE — Progress Notes (Signed)
PATIENT: Shelley Ryan DOB: 06/19/1975  REASON FOR VISIT: follow up HISTORY FROM: patient  Chief Complaint  Patient presents with  . Follow-up    hospital fu, rm 2, alone   . Migraine    pt states pain scale 7, nausea, vomiting and taking Phenergan every 8hrs      HISTORY OF PRESENT ILLNESS: Today 09/03/19 Shelley Ryan is a 44 y.o. female here today for follow up for intractable headaches. She continues topiramate 187m BID and Ajovy (just took second injection). On amitriptyline for gastroparesis (patient reports this was stopped in the hospital on 5/4 but no documentation of this). She continues to have persistent headaches. Daily headaches with migrainous symptoms of nausea, light and sound sensitivity. She also has pulsatile tinnitus. Usually left sided throbbing pain in temporal/occipital region. 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 Triptans not helping. Ubrelvy not helping. She was seen in the ER on 5/4 and 5/7. Extensive workup has been unremarkable. No head trauma. She has been on multiple migraine preventatives and abortive therapies with no significant relief. LP, CT venogram, MRI all negative. Ophthalmology exam unremarkable, no papilledema. Diamox caused significant side effects, on topiramate now. IV migraine cocktail seems to help. She was also [facility]   She continues to have headache today. Rated 8/10. She has significant nausea. She feels dizzy and lightheaded. She reports that she has been unable to eat any solid foods for the past 3 days. She has been able to maintain clear liquid diet. She was seen by PCP on Friday 08/31/2019. BP 158/88 today. She reports that readings are usually normal and feels it is elevated today due to pain. Labs in hospital were unremarkable.   She has had multiple referrals for second opinion. First appt with Dr FKris Moutoncanceled in March and now has  [facility]. She reports that she was called and told that this appt was going to be canceled as Dr FKris Moutonwas not a headache specialist. Appt still active in [place] She has [facility]. [facility] but no appt scheduled at this time.    HISTORY: (copied from Dr ACathren Lainenote on 08/23/2019)  Interval history August 23, 2019: Patient is here today with an 8 out of 10 headache.  Her blood pressure is normal.  She looks uncomfortable however bright light to examine her fundi did not cause any visible discomfort.  We will give her a migraine cocktail, will increase her Topamax to 100 mg twice daily, and will follow up on her referral to [facility]  Interval history: Opening pressure was only 22 and she is having side effects to the diamox and really not helping. 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 Also more word-finding problems. Transient aphasia and hemiparesis. Diamox has not helped anything so I doubt the IDIOPATHIC INTRACRANIAL HYPERTENSION diagnosis. Interfering with life. She started Ajovy on the 6th of April, will continue that. Will change diamox to Topiramate. She enies any changes in life or stress in her home life but no more stress than normal at work. No papilledema.   HPI:  Shelley Grillsis a 44y.o. female here as requested by MKathyrn Lass MD for headaches.PMHx PCOS, dural sinus thrombosis(2002), She does not  have a history of headache per patient. No history of migraines, no history of migraines in the family. She has sleep apnea and is very compliant with cpap and she follows closely with a sleep doctor. This is day 8 of her headaches, mostly diffuse and like a poker on the left side of the head, base of the skull, nausea and vertigo, tinniitus and a whooshing in her ears.  Headache is worse  laying down when flat, worse in the morning and better throughout the day. She has gained 40 pounds in the span of 2-3 months, she has had hormonal testing and has seen her primary care for this, she has an IUD, placed quite some time ago. Also vision changes, she has an ophthalmologist she just went and no papilledema. She has a lot of tightness in the cervical muscles as well that pre-dates the new symptoms will send for PT. She has left sided weakness and hemisensory loss from prior dural sinus thrombosis, worsening, may be recrudescence of symptoms as workup revealed no new issues. No other focal neurologic deficits, associated symptoms, inciting events or modifiable factors.  Reviewed notes, labs and imaging from outside physicians, which showed:  I reviewed MRI images of the brain February 2021 which were significant for partially empty sella which we can sometimes see in idiopathic intracranial hypertension.  I reviewed the reports of the MR angio of the head and neck which did not show any significant large or medium artery occlusions or stenosis, and MRV was normal.  I also reviewed labs.   REVIEW OF SYSTEMS: Out of a complete 14 system review of symptoms, the patient complains only of the following symptoms, headaches, dizziness, lightheaded, blurred vision, nausea, vomiting, and all other reviewed systems are negative.   ALLERGIES: Allergies  Allergen Reactions  . Cisapride Anaphylaxis  . Clarithromycin Nausea And Vomiting and Other (See Comments)    Acute renal failure, also  . Estrogens Other (See Comments)    Patient states she has a stroke and Blood clots  . Tape Rash and Other (See Comments)    Can only use paper tape- NO PLASTIC/CLEAR TAPE!!!!  . Cholestyramine Nausea And Vomiting  . Ciprofloxacin Hives and Rash  . Keflex [Cephalexin] Nausea And Vomiting and Other (See Comments)    Tolerates rocephin  . Metronidazole Diarrhea and Nausea And Vomiting       . Minocycline  Hcl Nausea And Vomiting and Other (See Comments)    Caused MIGRAINES  . Tetracyclines & Related Nausea And Vomiting and Other (See Comments)    Caused MIGRAINES, also  . Unasyn [Ampicillin-Sulbactam Sodium] Nausea And Vomiting  . Ampicillin Diarrhea       . Codeine Itching, Rash and Other (See Comments)    Can tolerate Tramadol  . Metoclopramide Other (See Comments)    Made me "not act like myself"    HOME MEDICATIONS: Outpatient Medications Prior to Visit  Medication Sig Dispense Refill  . acetaminophen (TYLENOL) 325 MG tablet Take 650 mg by mouth every 6 (six) hours as needed for mild pain.     . famotidine (PEPCID) 40 MG tablet Take 40 mg by mouth 2 (two) times daily.    . Fremanezumab-vfrm (AJOVY) 225 MG/1.5ML SOAJ Inject 225 mg into the skin every 30 (thirty) days. 1.5 mL 5  . hydroxypropyl methylcellulose / hypromellose (ISOPTO TEARS / GONIOVISC) 2.5 % ophthalmic solution Place 1 drop into both eyes daily as needed for dry eyes.    Marland Kitchen losartan (COZAAR) 50 MG  tablet Take 0.5 tablets (25 mg total) by mouth daily. (Patient taking differently: Take 50 mg by mouth daily. ) 30 tablet 0  . metFORMIN (GLUCOPHAGE) 500 MG tablet Take 500 mg by mouth 2 (two) times a day.    . Multiple Vitamins-Minerals (MULTIVITAMIN WITH MINERALS) tablet Take 1 tablet by mouth daily.    . promethazine (PHENERGAN) 50 MG tablet Take 0.5 tablets (25 mg total) by mouth every 8 (eight) hours as needed for up to 7 days for nausea or vomiting. 11 tablet 0  . rizatriptan (MAXALT-MLT) 10 MG disintegrating tablet Take 1 tablet (10 mg total) by mouth as needed for migraine. May repeat in 2 hours if needed 9 tablet 11  . topiramate (TOPAMAX) 100 MG tablet Take 1 tablet (100 mg total) by mouth 2 (two) times daily. 180 tablet 3  . Ubrogepant (UBRELVY) 50 MG TABS Take 50 mg by mouth as needed. At the onset of migraine- can repeat in 2 hours if needed (Patient taking differently: Take 50 mg by mouth See admin instructions. Take  50 mg by mouth at onset of a migraine and may repeat once in 2 hours.) 10 tablet 5  . Vitamin D, Ergocalciferol, (DRISDOL) 1.25 MG (50000 UNIT) CAPS capsule Take 1 capsule (50,000 Units total) by mouth every 7 (seven) days. (Patient taking differently: Take 50,000 Units by mouth every Monday. ) 4 capsule 0   No facility-administered medications prior to visit.    PAST MEDICAL HISTORY: Past Medical History:  Diagnosis Date  . Anemia   . Bilateral swelling of feet   . BRCA1 negative 10/08/2013  . CVA (cerebral infarction) 2002   Most likely from Hormonal Tx  . 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 Gallbladder problem   . Gastric outlet obstruction   . Gastroparesis   . H/O blood clots   . Heartburn   . Hiatal hernia   . Hyperlipidemia   . Hypertension   . IIH (idiopathic intracranial hypertension)   . Inflammatory arthritis   . Joint pain   . Low vitamin D level    2013  17  . OSA (obstructive sleep apnea)   . PCOS (polycystic ovarian syndrome)    stroke on OCP hormonal therapy  . Prediabetes   . Pylorospasm   . Stroke (cerebrum) ([place])   . Vitamin D deficiency     PAST SURGICAL HISTORY: Past Surgical History:  Procedure Laterality Date  . APPENDECTOMY    . CHOLECYSTECTOMY Shelley/A 10/09/2013   Procedure: LAPAROSCOPIC CHOLECYSTECTOMY WITH INTRAOPERATIVE CHOLANGIOGRAM;  Surgeon: Pedro Earls, MD;  Location: [facility];  Service: General;  Laterality: Shelley/A;  . COLON SURGERY    . KNEE ARTHROSCOPY Bilateral   . PYLOROPLASTY    . RIGHT OOPHORECTOMY    . SALPINGECTOMY Bilateral   . TYMPANOSTOMY TUBE PLACEMENT     x 2  . WISDOM TOOTH EXTRACTION      FAMILY HISTORY: Family History  Problem Relation Age of Onset  . Obesity Mother   . Hyperlipidemia Father   . Breast cancer Other        Great Grandmother   . Breast cancer Other   . Headache Neg Hx   . Migraines Neg Hx     SOCIAL HISTORY: Social History   Socioeconomic  History  . Marital status: Married    Spouse name: Not on file  . Number of children: Not on file  . Years of education:  Not on file  . Highest education level: Not on file  Occupational History  . Not on file  Tobacco Use  . Smoking status: Never Smoker  . Smokeless tobacco: Never Used  Substance and Sexual Activity  . Alcohol use: No  . Drug use: No  . Sexual activity: Never    Birth control/protection: None  Other Topics Concern  . Not on file  Social History Narrative   Lives with husband    Right handed   Caffeine: 1 cup once a week. Update 08/20/2019 not drinking any right now.   Social Determinants of Health   Financial Resource Strain:   . Difficulty of Paying Living Expenses:   Food Insecurity:   . Worried About Charity fundraiser in the Last Year:   . Arboriculturist in the Last Year:   Transportation Needs:   . Film/video editor (Medical):   Marland Kitchen Lack of Transportation (Non-Medical):   Physical Activity:   . Days of Exercise per Week:   . Minutes of Exercise per Session:   Stress:   . Feeling of Stress :   Social Connections:   . Frequency of Communication with Friends and Family:   . Frequency of Social Gatherings with Friends and Family:   . Attends Religious Services:   . Active Member of Clubs or Organizations:   . Attends Archivist Meetings:   Marland Kitchen Marital Status:   Intimate Partner Violence:   . Fear of Current or Ex-Partner:   . Emotionally Abused:   Marland Kitchen Physically Abused:   . Sexually Abused:       PHYSICAL EXAM  Vitals:   09/03/19 0757  BP: (!) 158/88  Pulse: 78  Temp: (!) 97 F (36.1 C)  Weight: 225 lb (102.1 kg)  Height: '5\' 7"'  (1.702 m)   Body mass index is 35.24 kg/m.  Generalized: Well developed, in no acute distress, patient requests dark room due to headache. She is able to communicate clearly. Appropriate conversation. Laughing at times during visit.   Cardiology: normal rate and rhythm, no murmur  noted Respiratory: clear to auscultation bilaterally  Neurological examination  Mentation: Alert oriented to time, place, history taking. Follows all commands speech and language fluent Cranial nerve II-XII: Pupils were equal round reactive to light. Extraocular movements were full, visual field were full on confrontational test. Facial sensation and strength were normal. Uvula tongue midline. Head turning and shoulder shrug  were normal and symmetric. Motor: The motor testing reveals 5 over 5 strength of all 4 extremities. Good symmetric motor tone is noted throughout.  Sensory: Sensory testing is intact to soft touch on all 4 extremities. No evidence of extinction is noted.  Coordination: Cerebellar testing reveals good finger-nose-finger and heel-to-shin bilaterally.  Gait and station: Gait is normal. Romberg is negative. No drift is seen.   DIAGNOSTIC DATA (LABS, IMAGING, TESTING) - I reviewed patient records, labs, notes, testing and imaging myself where available.  No flowsheet data found.   Lab Results  Component Value Date   WBC 12.1 (H) 08/29/2019   HGB 13.6 08/29/2019   HCT 41.3 08/29/2019   MCV 91.0 08/29/2019   PLT 211 08/29/2019      Component Value Date/Time   NA 141 08/29/2019 0549   NA 141 07/02/2019 1158   K 3.9 08/29/2019 0549   CL 109 08/29/2019 0549   CO2 18 (L) 08/29/2019 0549   GLUCOSE 96 08/29/2019 0549   BUN 14 08/29/2019 0549  BUN 16 07/02/2019 1158   CREATININE 0.81 08/29/2019 0549   CALCIUM 8.8 (L) 08/29/2019 0549   PROT 7.6 08/28/2019 1358   PROT 7.3 07/02/2019 1158   ALBUMIN 4.0 08/29/2019 1345   AST 26 08/28/2019 1358   ALT 39 08/28/2019 1358   ALKPHOS 67 08/28/2019 1358   BILITOT 1.1 08/28/2019 1358   BILITOT 0.3 07/02/2019 1158   GFRNONAA >60 08/29/2019 0549   GFRAA >60 08/29/2019 0549   Lab Results  Component Value Date   CHOL 201 (H) 07/02/2019   HDL 48 07/02/2019   LDLCALC 135 (H) 07/02/2019   TRIG 102 07/02/2019   Lab Results   Component Value Date   HGBA1C 6.1 (H) 06/19/2019   Lab Results  Component Value Date   [id number] 892 07/02/2019   Lab Results  Component Value Date   TSH 3.060 07/02/2019       ASSESSMENT AND PLAN 44 y.o. year old female  has a past medical history of Anemia, Bilateral swelling of feet, BRCA1 negative (10/08/2013), CVA (cerebral infarction) (2002), Dural sinus thrombosis (2002), Gallbladder problem, Gastric outlet obstruction, Gastroparesis, H/O blood clots, Heartburn, Hiatal hernia, Hyperlipidemia, Hypertension, IIH (idiopathic intracranial hypertension), Inflammatory arthritis, Joint pain, Low vitamin D level, OSA (obstructive sleep apnea), PCOS (polycystic ovarian syndrome), Prediabetes, Pylorospasm, Stroke (cerebrum) ([place]), and Vitamin D deficiency. here with     ICD-10-CM   1. Chronic intractable headache, unspecified headache type  R51.9    G89.29   2. Hx of cerebral venous sinus thrombosis  Z86.718     Martyna continued to experience a host of different headache symptoms. Some consistent with migraines, others are not. Some concerns of hemiplegic migraines versus complex migraines, however symptoms are very inconsistent. She has had extensive workup that has been unremarkable to this point. LP x 2 (opening pressure 22 and 17), CT venogram normal, MRI shows deep white matter hyperintensities bilaterally, most consistent with microvascular ischemia/complex migraines. No signs of stroke or other intracranial abnormalities. No IIH concerns found with imaging or ophthalmology exam. It is unclear why she continues to have intractable headaches. She has found no relief with multiple preventatives and abortive regimens. She was advised to contact GI to inquire about restarting amitriptyline. All records in Epic demonstrate she is taking this medication. If helpful, I have no concerns of continuing from a neurologic perspective. I have also asked her to follow up closely with PCP. Microvascular  ischemia could be related to elevated BP/elevated cholesterol levels. We have discussed considering Botox for migraine prevention. I will try Nurtec for abortive therapy as multiple triptans and Ubrelvy not effective. I have asked that she call to confirm appt with [facility] neurology tomorrow as this is active in Epic. I have also given her contact information for [facility] per Dr Cathren Laine previous referral request. She was encouraged to continue healthy lifestyle habits with well balanced diet, regular exercise and adequate hydration. No follow up at this time, pending second opinion.    No orders of the defined types were placed in this encounter.    Meds ordered this encounter  Medications  . Rimegepant Sulfate (NURTEC) 75 MG TBDP    Sig: Take 75 mg by mouth daily as needed (take for abortive therapy of migraine, no more than 1 tablet in 24 hours or 10 per month).    Dispense:  10 tablet    Refill:  11    Order Specific Question:   Supervising Provider    Answer:   Sarina Ill  B [[id number]]      I spent 15 minutes with the patient. 50% of this time was spent counseling and educating patient on plan of care and medications.    Debbora Presto, FNP-C 09/03/2019, 12:25 PM [facility] [address], [place], [place] [address] [phone]  Made any corrections needed, and agree with history, physical, neuro exam,assessment and plan as stated.     Sarina Ill, MD [facility]

## 2019-09-03 NOTE — Patient Instructions (Addendum)
[facility] Telephone [phone]  Please call to confirm appt with [facility] neurology as Epic has you scheduled for an appt tomorrow. Call GI to confirm continuation of amitriptyline. I do not have any hesitation from a neurological perspective for you to continue this medication. Continue close follow up with PCP. Check BP regularly. Continue follow up with rheumatology as directed.   Follow up with neurology at [facility] as directed. Call [facility] for appt if needed.    General Headache Without Cause A headache is pain or discomfort that is felt around the head or neck area. There are many causes and types of headaches. In some cases, the cause may not be found. Follow these instructions at home: Watch your condition for any changes. Let your doctor know about them. Take these steps to help with your condition: Managing pain      Take over-the-counter and prescription medicines only as told by your doctor.  Lie down in a dark, quiet room when you have a headache.  If told, put ice on your head and neck area: ? Put ice in a plastic bag. ? Place a towel between your skin and the bag. ? Leave the ice on for 20 minutes, 2-3 times per day.  If told, put heat on the affected area. Use the heat source that your doctor recommends, such as a moist heat pack or a heating pad. ? Place a towel between your skin and the heat source. ? Leave the heat on for 20-30 minutes. ? Remove the heat if your skin turns bright red. This is very important if you are unable to feel pain, heat, or cold. You may have a greater risk of getting burned.  Keep lights dim if bright lights bother you or make your headaches worse. Eating and drinking  Eat meals on a regular schedule.  If you drink alcohol: ? Limit how much you use to:  0-1 drink a day for women.  0-2 drinks a day for men. ? Be aware of how much alcohol is in your drink. In the [place] one drink equals one 12 oz bottle of beer (355 mL), one 5 oz glass of  wine (148 mL), or one 1 oz glass of hard liquor (44 mL).  Stop drinking caffeine, or reduce how much caffeine you drink. General instructions   Keep a journal to find out if certain things bring on headaches. For example, write down: ? What you eat and drink. ? How much sleep you get. ? Any change to your diet or medicines.  Get a massage or try other ways to relax.  Limit stress.  Sit up straight. Do not tighten (tense) your muscles.  Do not use any products that contain nicotine or tobacco. This includes cigarettes, e-cigarettes, and chewing tobacco. If you need help quitting, ask your doctor.  Exercise regularly as told by your doctor.  Get enough sleep. This often means 7-9 hours of sleep each night.  Keep all follow-up visits as told by your doctor. This is important. Contact a doctor if:  Your symptoms are not helped by medicine.  You have a headache that feels different than the other headaches.  You feel sick to your stomach (nauseous) or you throw up (vomit).  You have a fever. Get help right away if:  Your headache gets very bad quickly.  Your headache gets worse after a lot of physical activity.  You keep throwing up.  You have a stiff neck.  You have trouble  seeing.  You have trouble speaking.  You have pain in the eye or ear.  Your muscles are weak or you lose muscle control.  You lose your balance or have trouble walking.  You feel like you will pass out (faint) or you pass out.  You are mixed up (confused).  You have a seizure. Summary  A headache is pain or discomfort that is felt around the head or neck area.  There are many causes and types of headaches. In some cases, the cause may not be found.  Keep a journal to help find out what causes your headaches. Watch your condition for any changes. Let your doctor know about them.  Contact a doctor if you have a headache that is different from usual, or if your headache is not helped by  medicine.  Get help right away if your headache gets very bad, you throw up, you have trouble seeing, you lose your balance, or you have a seizure. This information is not intended to replace advice given to you by your health care provider. Make sure you discuss any questions you have with your health care provider. Document Revised: 10/31/2017 Document Reviewed: 10/31/2017 Elsevier Patient Education  [place].

## 2019-09-04 ENCOUNTER — Other Ambulatory Visit (INDEPENDENT_AMBULATORY_CARE_PROVIDER_SITE_OTHER): Payer: Self-pay | Admitting: Family Medicine

## 2019-09-04 DIAGNOSIS — E559 Vitamin D deficiency, unspecified: Secondary | ICD-10-CM

## 2019-09-04 NOTE — Progress Notes (Signed)
Chief Complaint:   OBESITY Shelley Ryan is here to discuss her progress with her obesity treatment plan along with follow-up of her obesity related diagnoses. Dylan is on the Category 2 Plan and states she is following her eating plan approximately 0% of the time. Taila states she is exercising for 0 minutes 0 times per week.  Today's visit was #: 5 Starting weight: 242 lbs Starting date: 07/02/2019 Today's weight: 221 lbs Today's date: 09/03/2019 Total lbs lost to date: 21 lbs Total lbs lost since last in-office visit: 11 lbs  Interim History: [name] is very upset today.  She says she is only able to keep down clear liquids.  She was admitted to the hospital for 3 days last week and then went to the [facility] ED for a migraine after she vomited blood.  She says she is waiting for the next dose of Phenergan.  Subjective:   1. Vitamin D deficiency Shelley Ryan's Vitamin D level was 22.0 on 06/19/2019. She is currently taking prescription vitamin D 50,000 IU each week. She denies nausea, vomiting or muscle weakness.  She endorses fatigue.  2. Non-intractable vomiting with nausea, unspecified vomiting type Shelley Ryan has had no real relief except with scheduled Phenergan.  She has lost 8 pounds in 2 weeks.  3. At risk for osteoporosis Shelley Ryan is at higher risk of osteopenia and osteoporosis due to Vitamin D deficiency.   Assessment/Plan:   1. Vitamin D deficiency Low Vitamin D level contributes to fatigue and are associated with obesity, breast, and colon cancer. She agrees to continue to take prescription Vitamin D @50 ,000 IU every week and will follow-up for routine testing of Vitamin D, at least 2-3 times per year to avoid over-replacement. - Vitamin D, Ergocalciferol, (DRISDOL) 1.25 MG (50000 UNIT) CAPS capsule; Take 1 capsule (50,000 Units total) by mouth every 7 (seven) days.  Dispense: 4 capsule; Refill: 0  2. Non-intractable vomiting with nausea, unspecified vomiting type Continue Phenergan as  needed for help with controlling nausea and vomiting.  3. At risk for osteoporosis Shelley Ryan was given approximately 15 minutes of osteoporosis prevention counseling today. Shelley Ryan is at risk for osteopenia and osteoporosis due to her Vitamin D deficiency. She was encouraged to take her Vitamin D and follow her higher calcium diet and increase strengthening exercise to help strengthen her bones and decrease her risk of osteopenia and osteoporosis.  Repetitive spaced learning was employed today to elicit superior memory formation and behavioral change.  4. Class 1 obesity with serious comorbidity and body mass index (BMI) of 34.0 to 34.9 in adult, unspecified obesity type Shelley Ryan is currently in the action stage of change. As such, her goal is to continue with weight loss efforts. She has agreed to practicing portion control and making smarter food choices, such as increasing vegetables and decreasing simple carbohydrates.   Exercise goals: No exercise has been prescribed at this time.  Behavioral modification strategies: increasing lean protein intake, increasing vegetables, meal planning and cooking strategies, keeping healthy foods in the home and planning for success.  Shelley Ryan has agreed to follow-up with our clinic in 3 weeks. She was informed of the importance of frequent follow-up visits to maximize her success with intensive lifestyle modifications for her multiple health conditions.   Objective:   Blood pressure 115/77, pulse (!) 102, temperature 98.3 F (36.8 C), temperature source Oral, height 5\' 7"  (1.702 m), weight 221 lb (100.2 kg), last menstrual period 08/20/2019, SpO2 97 %. Body mass index is 34.61 kg/m.  General: Cooperative, alert, well developed, in no acute distress. HEENT: Conjunctivae and lids unremarkable. Cardiovascular: Regular rhythm.  Lungs: Normal work of breathing. Neurologic: No focal deficits.   Lab Results  Component Value Date   CREATININE 0.81 08/29/2019   BUN  14 08/29/2019   NA 141 08/29/2019   K 3.9 08/29/2019   CL 109 08/29/2019   CO2 18 (L) 08/29/2019   Lab Results  Component Value Date   ALT 39 08/28/2019   AST 26 08/28/2019   ALKPHOS 67 08/28/2019   BILITOT 1.1 08/28/2019   Lab Results  Component Value Date   HGBA1C 6.1 (H) 06/19/2019   Lab Results  Component Value Date   INSULIN 13.7 07/02/2019   Lab Results  Component Value Date   TSH 3.060 07/02/2019   Lab Results  Component Value Date   CHOL 201 (H) 07/02/2019   HDL 48 07/02/2019   LDLCALC 135 (H) 07/02/2019   TRIG 102 07/02/2019   Lab Results  Component Value Date   WBC 12.1 (H) 08/29/2019   HGB 13.6 08/29/2019   HCT 41.3 08/29/2019   MCV 91.0 08/29/2019   PLT 211 08/29/2019   Attestation Statements:   Reviewed by clinician on day of visit: allergies, medications, problem list, medical history, surgical history, family history, social history, and previous encounter notes.  I, Water quality scientist, CMA, am acting as transcriptionist for Coralie Common, MD.  I have reviewed the above documentation for accuracy and completeness, and I agree with the above. - Jinny Blossom, MD

## 2019-09-06 ENCOUNTER — Telehealth: Payer: Self-pay | Admitting: *Deleted

## 2019-09-06 NOTE — Telephone Encounter (Signed)
Nurtec PA, key: [id number], R51.9, G89.29. Failed Triptans, Ubrelvy.

## 2019-09-10 ENCOUNTER — Encounter: Payer: Self-pay | Admitting: *Deleted

## 2019-09-10 ENCOUNTER — Telehealth: Payer: Self-pay | Admitting: *Deleted

## 2019-09-10 NOTE — Telephone Encounter (Signed)
We received a Bernita Raisin PA request however last saw Amy and it was noted that Bernita Raisin was ineffective and nurtec would be ordered instead. I did not do this PA for Ubrelvy. I faxed back a note to pharmacy saying Nurtec was ordered instead on 5/10. Received a receipt of confirmation.

## 2019-09-10 NOTE — Telephone Encounter (Signed)
[facility]  received a request from your provider for coverage of Nurtec 75MG  OR TBDP. As long as you remain covered by the [facility] and there are no changes to your plan benefits, this request is approved for the following time period: 09/06/2019 - 09/05/2020.  Approval faxed to [facility], [facility]. Patient notified via my chart.

## 2019-09-11 ENCOUNTER — Ambulatory Visit (INDEPENDENT_AMBULATORY_CARE_PROVIDER_SITE_OTHER): Payer: BC Managed Care – PPO | Admitting: Family Medicine

## 2019-09-25 ENCOUNTER — Other Ambulatory Visit: Payer: Self-pay

## 2019-09-25 ENCOUNTER — Ambulatory Visit (INDEPENDENT_AMBULATORY_CARE_PROVIDER_SITE_OTHER): Payer: BC Managed Care – PPO | Admitting: Family Medicine

## 2019-09-25 ENCOUNTER — Encounter (INDEPENDENT_AMBULATORY_CARE_PROVIDER_SITE_OTHER): Payer: Self-pay | Admitting: Family Medicine

## 2019-09-25 VITALS — BP 111/72 | Temp 98.2°F | Ht 67.0 in | Wt 222.0 lb

## 2019-09-25 DIAGNOSIS — E559 Vitamin D deficiency, unspecified: Secondary | ICD-10-CM | POA: Diagnosis not present

## 2019-09-25 DIAGNOSIS — R7303 Prediabetes: Secondary | ICD-10-CM

## 2019-09-25 DIAGNOSIS — Z6837 Body mass index (BMI) 37.0-37.9, adult: Secondary | ICD-10-CM

## 2019-09-25 DIAGNOSIS — Z9189 Other specified personal risk factors, not elsewhere classified: Secondary | ICD-10-CM | POA: Diagnosis not present

## 2019-09-25 MED ORDER — VITAMIN D (ERGOCALCIFEROL) 1.25 MG (50000 UNIT) PO CAPS: 50000.0000 [IU] | ORAL_CAPSULE | ORAL | 0 refills | Status: DC

## 2019-09-25 NOTE — Progress Notes (Signed)
Chief Complaint:   OBESITY Shelley Ryan is here to discuss her progress with her obesity treatment plan along with follow-up of her obesity related diagnoses. Shelley Ryan is following a lower carbohydrate, vegetable and lean protein rich diet plan/Category 2 Plan and states she is following her eating plan approximately 85% of the time. Shelley Ryan states she is exercising 0 minutes 0 times per week.  Today's visit was #: 6 Starting weight: 242 lbs Starting date: 07/02/2019 Today's weight: 222 lbs Today's date: 09/25/2019 Total lbs lost to date: 20 Total lbs lost since last in-office visit: 0  Interim History: Shelley Ryan reports feeling much better than she had at her previous appointment. In terms of eating, she is able to get all of the food in and denies hunger. She is doing 8 oz of protein in the evening. For snacks she is doing [place] bars or small ice creams or yogurts.  Subjective:   Vitamin D deficiency. No nausea, vomiting, or muscle weakness. Shelley Ryan endorses fatigue. She is on prescription Vitamin D supplementation.Last Vitamin D 22.0 on 06/19/2019.  Prediabetes. Shelley Ryan has a diagnosis of prediabetes based on her elevated HgA1c and was informed this puts her at greater risk of developing diabetes. She continues to work on diet and exercise to decrease her risk of diabetes. She denies nausea or hypoglycemia. Shelley Ryan is on metformin BID.  Lab Results  Component Value Date   HGBA1C 6.1 (H) 06/19/2019   Lab Results  Component Value Date   INSULIN 13.7 07/02/2019   At risk for osteoporosis. Shelley Ryan is at higher risk of osteopenia and osteoporosis due to Vitamin D deficiency.   Assessment/Plan:   Vitamin D deficiency. Low Vitamin D level contributes to fatigue and are associated with obesity, breast, and colon cancer. She was given a refill on her Vitamin D, Ergocalciferol, (DRISDOL) 1.25 MG (50000 UNIT) CAPS capsule every week #4 with 0 refills and will follow-up for routine testing of  Vitamin D, at least 2-3 times per year to avoid over-replacement.    Prediabetes. Shelley Ryan will continue to work on weight loss, exercise, and decreasing simple carbohydrates to help decrease the risk of diabetes. She will have repeat labs within the next month.  At risk for osteoporosis. Shelley Ryan was given approximately 15 minutes of osteoporosis prevention counseling today. Shelley Ryan is at risk for osteopenia and osteoporosis due to her Vitamin D deficiency. She was encouraged to take her Vitamin D and follow her higher calcium diet and increase strengthening exercise to help strengthen her bones and decrease her risk of osteopenia and osteoporosis.  Repetitive spaced learning was employed today to elicit superior memory formation and behavioral change.  Class 2 severe obesity with serious comorbidity and body mass index (BMI) of 37.0 to 37.9 in adult, unspecified obesity type ([place]).  Shelley Ryan is currently in the action stage of change. As such, her goal is to continue with weight loss efforts. She has agreed to the Category 2 Plan.   Exercise goals: For substantial health benefits, adults should do at least 150 minutes (2 hours and 30 minutes) a week of moderate-intensity, or 75 minutes (1 hour and 15 minutes) a week of vigorous-intensity aerobic physical activity, or an equivalent combination of moderate- and vigorous-intensity aerobic activity. Aerobic activity should be performed in episodes of at least 10 minutes, and preferably, it should be spread throughout the week.  Behavioral modification strategies: increasing lean protein intake, increasing vegetables, meal planning and cooking strategies, keeping healthy foods in the home and  planning for success.  Shelley Ryan has agreed to follow-up with our clinic in 2-3 weeks. She was informed of the importance of frequent follow-up visits to maximize her success with intensive lifestyle modifications for her multiple health conditions.   Objective:   Blood  pressure 111/72, temperature 98.2 F (36.8 C), temperature source Oral, height 5\' 7"  (1.702 m), weight 222 lb (100.7 kg), last menstrual period 09/16/2019, SpO2 98 %. Body mass index is 34.77 kg/m.  General: Cooperative, alert, well developed, in no acute distress. HEENT: Conjunctivae and lids unremarkable. Cardiovascular: Regular rhythm.  Lungs: Normal work of breathing. Neurologic: No focal deficits.   Lab Results  Component Value Date   CREATININE 0.81 08/29/2019   BUN 14 08/29/2019   NA 141 08/29/2019   K 3.9 08/29/2019   CL 109 08/29/2019   CO2 18 (L) 08/29/2019   Lab Results  Component Value Date   ALT 39 08/28/2019   AST 26 08/28/2019   ALKPHOS 67 08/28/2019   BILITOT 1.1 08/28/2019   Lab Results  Component Value Date   HGBA1C 6.1 (H) 06/19/2019   Lab Results  Component Value Date   INSULIN 13.7 07/02/2019   Lab Results  Component Value Date   TSH 3.060 07/02/2019   Lab Results  Component Value Date   CHOL 201 (H) 07/02/2019   HDL 48 07/02/2019   LDLCALC 135 (H) 07/02/2019   TRIG 102 07/02/2019   Lab Results  Component Value Date   WBC 12.1 (H) 08/29/2019   HGB 13.6 08/29/2019   HCT 41.3 08/29/2019   MCV 91.0 08/29/2019   PLT 211 08/29/2019   No results found for: IRON, TIBC, FERRITIN  Attestation Statements:   Reviewed by clinician on day of visit: allergies, medications, problem list, medical history, surgical history, family history, social history, and previous encounter notes.  I, 10/29/2019, am acting as transcriptionist for Marianna Payment, MD   I have reviewed the above documentation for accuracy and completeness, and I agree with the above. - Reuben Likes, MD

## 2019-09-27 LAB — FUNGUS CULTURE RESULT

## 2019-09-27 LAB — FUNGUS CULTURE WITH STAIN

## 2019-09-27 LAB — FUNGAL ORGANISM REFLEX

## 2019-10-09 ENCOUNTER — Ambulatory Visit: Payer: BC Managed Care – PPO | Admitting: Neurology

## 2019-10-09 ENCOUNTER — Ambulatory Visit (INDEPENDENT_AMBULATORY_CARE_PROVIDER_SITE_OTHER): Payer: BC Managed Care – PPO | Admitting: Adult Health

## 2019-10-09 ENCOUNTER — Encounter (INDEPENDENT_AMBULATORY_CARE_PROVIDER_SITE_OTHER): Payer: Self-pay | Admitting: Adult Health

## 2019-10-09 ENCOUNTER — Other Ambulatory Visit: Payer: Self-pay

## 2019-10-09 VITALS — BP 99/62 | HR 73 | Temp 98.1°F | Ht 67.0 in | Wt 221.0 lb

## 2019-10-09 DIAGNOSIS — E669 Obesity, unspecified: Secondary | ICD-10-CM

## 2019-10-09 DIAGNOSIS — I10 Essential (primary) hypertension: Secondary | ICD-10-CM | POA: Diagnosis not present

## 2019-10-09 DIAGNOSIS — R7303 Prediabetes: Secondary | ICD-10-CM | POA: Diagnosis not present

## 2019-10-09 DIAGNOSIS — Z6834 Body mass index (BMI) 34.0-34.9, adult: Secondary | ICD-10-CM

## 2019-10-09 DIAGNOSIS — Z9189 Other specified personal risk factors, not elsewhere classified: Secondary | ICD-10-CM | POA: Diagnosis not present

## 2019-10-09 NOTE — Progress Notes (Addendum)
Chief Complaint:   OBESITY Shelley Ryan is here to discuss her progress with her obesity treatment plan along with follow-up of her obesity related diagnoses. Shelley Ryan is on Cat 2 Plan and trying to reduce carbohydrates, she states she is following her eating plan approximately 90% of the time. Shelley Ryan states she is doing 0 minutes 0 times per week.  Today's visit was #: 7 Starting weight: 242 lbs Starting date: 07/02/2019 Today's weight: 221 lbs Today's date: 10/09/2019 Total lbs lost to date: 21 Total lbs lost since last in-office visit: 1  Interim History: Shelley Ryan is following the Cat 2/lower carbohydrate plan, and she denies polyphagia. When eating out, she remains on plan by focusing on lean protein, vegetables and avoiding simple carbohydrates.   Subjective:   1. Essential hypertension Shelley Ryan's blood pressure is soft today. Per the patient, her ambulatory reading systolic is between 90 and 740, and diastolic is between 60 and 70's. She denies symptoms of hypotension.  2. Pre-diabetes Shelley Ryan has polycystic ovarian syndrome. She has a diagnosis of pre-diabetes based on her elevated Hgb A1c and was informed this puts her at greater risk of developing diabetes. She is taking metformin 500 mg BID and is tolerating it well. She continues to work on diet and exercise to decrease her risk of diabetes. She denies nausea or hypoglycemia.  3. At risk for complication associated with hypotension The patient is at a higher than average risk of hypotension due to being on ARB and blood pressure trending down with consistent weight loss.  Assessment/Plan:   1. Essential hypertension Shelley Ryan is working on healthy weight loss and exercise to improve blood pressure control. We will watch for signs of hypotension as she continues her lifestyle modifications. Shelley Ryan to decrease losartan to 50 mg 1/2 tablet q daily. She is to check her blood pressure at home and bring log to her next office visit. We will  recheck labs at her next office visit.  2. Pre-diabetes Shelley Ryan will continue to work on weight loss, exercise, and decreasing simple carbohydrates to help decrease the risk of diabetes. Shelley Ryan will continue her current metformin regimen. We will recheck labs at her next office visit.  3. At risk for complication associated with hypotension Shelley Ryan was given approximately 15 minutes of education and counseling today to help avoid hypotension. We discussed risks of hypotension with weight loss and signs of hypotension such as feeling lightheaded or unsteady.  Repetitive spaced learning was employed today to elicit superior memory formation and behavioral change.  4. Class 1 obesity with serious comorbidity and body mass index (BMI) of 34.0 to 34.9 in adult, unspecified obesity type Shelley Ryan is currently in the action stage of change. As such, her goal is to continue with weight loss efforts. She has Ryan to Cat 2 Plan.   Handout provided today: Eating Out Guide.  Exercise goals: No exercise has been prescribed at this time.  Behavioral modification strategies: increasing lean protein intake and meal planning and cooking strategies.  Shelley Ryan has Ryan to follow-up with our clinic in 2 weeks. She was informed of the importance of frequent follow-up visits to maximize her success with intensive lifestyle modifications for her multiple health conditions.   Objective:   Blood pressure 99/62, pulse 73, temperature 98.1 F (36.7 C), temperature source Oral, height 5\' 7"  (1.702 m), weight 221 lb (100.2 kg), last menstrual period 09/16/2019, SpO2 97 %. Body mass index is 34.61 kg/m.  General: Cooperative, alert, well developed, in no acute  distress. HEENT: Conjunctivae and lids unremarkable. Cardiovascular: Regular rhythm.  Lungs: Normal work of breathing. Neurologic: No focal deficits.   Lab Results  Component Value Date   CREATININE 0.81 08/29/2019   BUN 14 08/29/2019   NA 141 08/29/2019   K  3.9 08/29/2019   CL 109 08/29/2019   CO2 18 (L) 08/29/2019   Lab Results  Component Value Date   ALT 39 08/28/2019   AST 26 08/28/2019   ALKPHOS 67 08/28/2019   BILITOT 1.1 08/28/2019   Lab Results  Component Value Date   HGBA1C 6.1 (H) 06/19/2019   Lab Results  Component Value Date   INSULIN 13.7 07/02/2019   Lab Results  Component Value Date   TSH 3.060 07/02/2019   Lab Results  Component Value Date   CHOL 201 (H) 07/02/2019   HDL 48 07/02/2019   LDLCALC 135 (H) 07/02/2019   TRIG 102 07/02/2019   Lab Results  Component Value Date   WBC 12.1 (H) 08/29/2019   HGB 13.6 08/29/2019   HCT 41.3 08/29/2019   MCV 91.0 08/29/2019   PLT 211 08/29/2019   No results found for: IRON, TIBC, FERRITIN  Attestation Statements:   Reviewed by clinician on day of visit: allergies, medications, problem list, medical history, surgical history, family history, social history, and previous encounter notes.   Trude Mcburney, am acting as Energy manager for [employer], NP-C.  I have reviewed the above documentation for accuracy and completeness, and I agree with the above. -  Julaine Fusi, NP

## 2019-10-23 ENCOUNTER — Encounter (INDEPENDENT_AMBULATORY_CARE_PROVIDER_SITE_OTHER): Payer: Self-pay | Admitting: Adult Health

## 2019-10-23 ENCOUNTER — Ambulatory Visit (INDEPENDENT_AMBULATORY_CARE_PROVIDER_SITE_OTHER): Payer: BC Managed Care – PPO | Admitting: Adult Health

## 2019-10-23 ENCOUNTER — Other Ambulatory Visit: Payer: Self-pay

## 2019-10-23 VITALS — BP 109/72 | HR 90 | Temp 97.9°F | Ht 67.0 in | Wt 221.0 lb

## 2019-10-23 DIAGNOSIS — I10 Essential (primary) hypertension: Secondary | ICD-10-CM | POA: Diagnosis not present

## 2019-10-23 DIAGNOSIS — Z6834 Body mass index (BMI) 34.0-34.9, adult: Secondary | ICD-10-CM

## 2019-10-23 DIAGNOSIS — E559 Vitamin D deficiency, unspecified: Secondary | ICD-10-CM | POA: Diagnosis not present

## 2019-10-23 DIAGNOSIS — R7303 Prediabetes: Secondary | ICD-10-CM

## 2019-10-23 DIAGNOSIS — E669 Obesity, unspecified: Secondary | ICD-10-CM

## 2019-10-23 DIAGNOSIS — Z9189 Other specified personal risk factors, not elsewhere classified: Secondary | ICD-10-CM

## 2019-10-23 MED ORDER — VITAMIN D (ERGOCALCIFEROL) 1.25 MG (50000 UNIT) PO CAPS: 50000.0000 [IU] | ORAL_CAPSULE | ORAL | 0 refills | Status: DC

## 2019-10-23 NOTE — Progress Notes (Signed)
Chief Complaint:   OBESITY Shelley Ryan is here to discuss her progress with her obesity treatment plan along with follow-up of her obesity related diagnoses. Shelley Ryan is on the Category 2 Plan and states she is following her eating plan approximately 85% of the time. Shelley Ryan states she is exercising 0 minutes 0 times per week.  Today's visit was #: 8 Starting weight: 242 lbs Starting date: 07/02/2019 Today's weight: 221 lbs Today's date: 10/23/2019 Total lbs lost to date: 21 Total lbs lost since last in-office visit: 0  Interim History: Shelley Ryan is frustrated with weight loss plateau and she is consistently weighing/measuring food/snacks. She denies polyphagia and can struggle "with normal appetite levels due to chronic gastroparesis." She has recently established with [facility] for management of her migraines.  She reports frequency and intensity of HAs have decreased.   Subjective:   Vitamin D deficiency. Shelley Ryan is on prescription Vitamin D supplementation with no side effects.   Ref. Range 06/19/2019 11:49  Vitamin D, 25-Hydroxy Latest Ref Range: 30.0 - 100.0 ng/mL 22.0 (L)   Essential hypertension. Blood pressure is at goal at today's office visit. She is on losartan 50 mg 1/2 tab daily, which was recently reduced due to blood pressure trending down with consistent weight loss.  BP Readings from Last 3 Encounters:  10/23/19 109/72  10/09/19 99/62  09/25/19 111/72   Lab Results  Component Value Date   CREATININE 0.81 08/29/2019   CREATININE 0.90 08/28/2019   CREATININE 1.10 (H) 08/28/2019   Prediabetes. Shelley Ryan has a diagnosis of prediabetes based on her elevated HgA1c and was informed this puts her at greater risk of developing diabetes. She continues to work on diet and exercise to decrease her risk of diabetes. She denies nausea or hypoglycemia. Shelley Ryan is on metformin 500 mg BID and tolerating it well.  Lab Results  Component Value Date   HGBA1C 6.1 (H)  06/19/2019   Lab Results  Component Value Date   INSULIN 13.7 07/02/2019   At risk for complication associated with hypotension. The patient is at a higher than average risk of hypotension due to consistent weight loss. Shelley Ryan will continue losartan 25 mg daily and will continue close monitoring of blood pressure and signs and symptoms of hypotension.  Assessment/Plan:   Vitamin D deficiency. Low Vitamin D level contributes to fatigue and are associated with obesity, breast, and colon cancer. She was given a refill on her Vitamin D, Ergocalciferol, (DRISDOL) 1.25 MG (50000 UNIT) CAPS capsule every week #4 with 0 refills and VITAMIN D 25 Hydroxy (Vit-D Deficiency, Fractures) level was ordered today.  Essential hypertension. Shelley Ryan is working on healthy weight loss and exercise to improve blood pressure control. We will watch for signs of hypotension as she continues her lifestyle modifications. Shelley Ryan will continue on losartan 25 mg daily. She will continue to closely monitor blood pressure and watch for signs and symptoms of hypotention. Comprehensive metabolic panel, Lipid Panel With LDL/HDL Ratio ordered today.  Prediabetes. Shelley Ryan will continue to work on weight loss, exercise, and decreasing simple carbohydrates to help decrease the risk of diabetes. She will change to the low carbohydrate meal plan. Hemoglobin A1c, Insulin, random ordered today.  At risk for complication associated with hypotension. Shelley Ryan was given approximately 15 minutes of education and counseling today to help avoid hypotension. We discussed risks of hypotension with weight loss and signs of hypotension such as feeling lightheaded or unsteady.  Repetitive spaced learning was employed today to elicit superior  memory formation and behavioral change.  Class 1 obesity with serious comorbidity and body mass index (BMI) of 34.0 to 34.9 in adult, unspecified obesity type.  [name] is currently in the action stage of change. As  such, her goal is to continue with weight loss efforts. She has agreed to change plans and will now follow a lower carbohydrate, vegetable and lean protein rich diet plan.   Exercise goals: No exercise has been prescribed at this time.  Behavioral modification strategies: increasing lean protein intake, decreasing simple carbohydrates, meal planning and cooking strategies and planning for success.  Shelley Ryan has agreed to follow-up with our clinic in 2 weeks. She was informed of the importance of frequent follow-up visits to maximize her success with intensive lifestyle modifications for her multiple health conditions.   Shelley Ryan was informed we would discuss her lab results at her next visit unless there is a critical issue that needs to be addressed sooner. Shelley Ryan agreed to keep her next visit at the agreed upon time to discuss these results.  Objective:   Blood pressure 109/72, pulse 90, temperature 97.9 F (36.6 C), temperature source Oral, height 5\' 7"  (1.702 m), weight 221 lb (100.2 kg), last menstrual period 10/09/2019, SpO2 99 %. Body mass index is 34.61 kg/m.  General: Cooperative, alert, well developed, in no acute distress. HEENT: Conjunctivae and lids unremarkable. Cardiovascular: Regular rhythm.  Lungs: Normal work of breathing. Neurologic: No focal deficits.   Lab Results  Component Value Date   CREATININE 0.81 08/29/2019   BUN 14 08/29/2019   NA 141 08/29/2019   K 3.9 08/29/2019   CL 109 08/29/2019   CO2 18 (L) 08/29/2019   Lab Results  Component Value Date   ALT 39 08/28/2019   AST 26 08/28/2019   ALKPHOS 67 08/28/2019   BILITOT 1.1 08/28/2019   Lab Results  Component Value Date   HGBA1C 6.1 (H) 06/19/2019   Lab Results  Component Value Date   INSULIN 13.7 07/02/2019   Lab Results  Component Value Date   TSH 3.060 07/02/2019   Lab Results  Component Value Date   CHOL 201 (H) 07/02/2019   HDL 48 07/02/2019   LDLCALC 135 (H) 07/02/2019   TRIG 102  07/02/2019   Lab Results  Component Value Date   WBC 12.1 (H) 08/29/2019   HGB 13.6 08/29/2019   HCT 41.3 08/29/2019   MCV 91.0 08/29/2019   PLT 211 08/29/2019   No results found for: IRON, TIBC, FERRITIN  Attestation Statements:   Reviewed by clinician on day of visit: allergies, medications, problem list, medical history, surgical history, family history, social history, and previous encounter notes.  I, 10/29/2019, am acting as Marianna Payment for Energy manager, NP-C   I have reviewed the above documentation for accuracy and completeness, and I agree with the above. -  [employer], NP

## 2019-10-24 LAB — COMPREHENSIVE METABOLIC PANEL
ALT: 18 IU/L (ref 0–32)
AST: 15 IU/L (ref 0–40)
Albumin/Globulin Ratio: 1.6 (ref 1.2–2.2)
Albumin: 4.4 g/dL (ref 3.8–4.8)
Alkaline Phosphatase: 80 IU/L (ref 48–121)
BUN/Creatinine Ratio: 26 — ABNORMAL HIGH (ref 9–23)
BUN: 22 mg/dL (ref 6–24)
Bilirubin Total: 0.4 mg/dL (ref 0.0–1.2)
CO2: 18 mmol/L — ABNORMAL LOW (ref 20–29)
Calcium: 9.2 mg/dL (ref 8.7–10.2)
Chloride: 107 mmol/L — ABNORMAL HIGH (ref 96–106)
Creatinine, Ser: 0.86 mg/dL (ref 0.57–1.00)
GFR calc Af Amer: 94 mL/min/{1.73_m2} (ref >59)
GFR calc non Af Amer: 82 mL/min/{1.73_m2} (ref >59)
Globulin, Total: 2.8 g/dL (ref 1.5–4.5)
Glucose: 96 mg/dL (ref 65–99)
Potassium: 4.1 mmol/L (ref 3.5–5.2)
Sodium: 139 mmol/L (ref 134–144)
Total Protein: 7.2 g/dL (ref 6.0–8.5)

## 2019-10-24 LAB — HEMOGLOBIN A1C
Est. average glucose Bld gHb Est-mCnc: 111 mg/dL
Hgb A1c MFr Bld: 5.5 % (ref 4.8–5.6)

## 2019-10-24 LAB — LIPID PANEL WITH LDL/HDL RATIO
Cholesterol, Total: 181 mg/dL (ref 100–199)
HDL: 43 mg/dL (ref >39)
LDL Chol Calc (NIH): 121 mg/dL — ABNORMAL HIGH (ref 0–99)
LDL/HDL Ratio: 2.8 ratio (ref 0.0–3.2)
Triglycerides: 95 mg/dL (ref 0–149)
VLDL Cholesterol Cal: 17 mg/dL (ref 5–40)

## 2019-10-24 LAB — INSULIN, RANDOM: INSULIN: 18.2 u[IU]/mL (ref 2.6–24.9)

## 2019-10-24 LAB — VITAMIN D 25 HYDROXY (VIT D DEFICIENCY, FRACTURES): Vit D, 25-Hydroxy: 38.6 ng/mL (ref 30.0–100.0)

## 2019-11-05 ENCOUNTER — Ambulatory Visit (INDEPENDENT_AMBULATORY_CARE_PROVIDER_SITE_OTHER): Payer: BC Managed Care – PPO | Admitting: Adult Health

## 2019-11-05 ENCOUNTER — Encounter (INDEPENDENT_AMBULATORY_CARE_PROVIDER_SITE_OTHER): Payer: Self-pay | Admitting: Adult Health

## 2019-11-05 ENCOUNTER — Other Ambulatory Visit: Payer: Self-pay

## 2019-11-05 VITALS — BP 112/77 | HR 77 | Temp 98.3°F | Ht 67.0 in | Wt 218.0 lb

## 2019-11-05 DIAGNOSIS — I10 Essential (primary) hypertension: Secondary | ICD-10-CM | POA: Diagnosis not present

## 2019-11-05 DIAGNOSIS — E669 Obesity, unspecified: Secondary | ICD-10-CM

## 2019-11-05 DIAGNOSIS — R7303 Prediabetes: Secondary | ICD-10-CM

## 2019-11-05 DIAGNOSIS — Z6834 Body mass index (BMI) 34.0-34.9, adult: Secondary | ICD-10-CM

## 2019-11-05 DIAGNOSIS — E559 Vitamin D deficiency, unspecified: Secondary | ICD-10-CM

## 2019-11-05 NOTE — Progress Notes (Signed)
Chief Complaint:   OBESITY Shelley Ryan is here to discuss her progress with her obesity treatment plan along with follow-up of her obesity related diagnoses. Shelley Ryan is following a lower carbohydrate, vegetable and lean protein rich diet plan and states she is following her eating plan approximately 90% of the time. Shelley Ryan states she is exercising 0 minutes 0 times per week.  Today's visit was #: 9 Starting weight: 242 lbs Starting date: 07/02/2019 Today's weight: 218 lbs Today's date: 11/05/2019 Total lbs lost to date: 24 Total lbs lost since last in-office visit: 3  Interim History: Shelley Ryan continues to enjoy the low carb plan and denies excessive cravings or polyphagia. When she has to eat out or when traveling, she will focus on lean protein/vegetables to stay on track. She has been traveling to [place] frequently to support the husband/family of a friend who unexpectedly passed away.  Subjective:   Vitamin D deficiency. Shelley Ryan is on prescription Vitamin D supplementation. No nausea, vomiting, or muscle weakness.    Ref. Range 10/23/2019 16:01  Vitamin D, 25-Hydroxy Latest Ref Range: 30.0 - 100.0 ng/mL 38.6   Essential hypertension. Blood pressure is well controlled at today's office visit. Shelley Ryan is on losartan 50 mg 1/2 tab daily. She denies cardiac symptoms.  BP Readings from Last 3 Encounters:  11/05/19 112/77  10/23/19 109/72  10/09/19 99/62   Lab Results  Component Value Date   CREATININE 0.86 10/23/2019   CREATININE 0.81 08/29/2019   CREATININE 0.90 08/28/2019   Prediabetes. Shelley Ryan has a diagnosis of prediabetes based on her elevated HgA1c and was informed this puts her at greater risk of developing diabetes. She continues to work on diet and exercise to decrease her risk of diabetes. She denies nausea or hypoglycemia. A1c 5.5 on 10/23/2019, improved from 6.2 in June 2020. Shelley Ryan is taking metformin 500 mg BID and tolerating it well. Recent CMP was  stable.  Lab Results  Component Value Date   HGBA1C 5.5 10/23/2019   Lab Results  Component Value Date   INSULIN 18.2 10/23/2019   INSULIN 13.7 07/02/2019   Assessment/Plan:   Vitamin D deficiency. Low Vitamin D level contributes to fatigue and are associated with obesity, breast, and colon cancer. She agrees to continue to take prescription Vitamin D as directed (no medication refill) and will follow-up for routine testing of Vitamin D every 3 months.   Essential hypertension. Shelley Ryan is working on healthy weight loss and exercise to improve blood pressure control. We will watch for signs of hypotension as she continues her lifestyle modifications. She will continue her ARB as directed.  Prediabetes. Shelley Ryan will continue to work on weight loss, exercise, and decreasing simple carbohydrates to help decrease the risk of diabetes. She will continue her current metformin regimen as directed and will have labs checked every 3 months.  Class 1 obesity with serious comorbidity and body mass index (BMI) of 34.0 to 34.9 in adult, unspecified obesity type.  Shelley Ryan is currently in the action stage of change. As such, her goal is to continue with weight loss efforts. She has agreed to following a lower carbohydrate, vegetable and lean protein rich diet plan.   Exercise goals: No exercise has been prescribed at this time.  Behavioral modification strategies: increasing lean protein intake, increasing water intake, meal planning and cooking strategies, travel eating strategies, celebration eating strategies and planning for success.  Shelley Ryan has agreed to follow-up with our clinic in 2 weeks. She was informed of the importance  of frequent follow-up visits to maximize her success with intensive lifestyle modifications for her multiple health conditions.   Objective:   Blood pressure 112/77, pulse 77, temperature 98.3 F (36.8 C), temperature source Oral, height 5\' 7"  (1.702 m), weight 218 lb (98.9 kg),  last menstrual period 10/09/2019, SpO2 99 %. Body mass index is 34.14 kg/m.  General: Cooperative, alert, well developed, in no acute distress. HEENT: Conjunctivae and lids unremarkable. Cardiovascular: Regular rhythm.  Lungs: Normal work of breathing. Neurologic: No focal deficits.   Lab Results  Component Value Date   CREATININE 0.86 10/23/2019   BUN 22 10/23/2019   NA 139 10/23/2019   K 4.1 10/23/2019   CL 107 (H) 10/23/2019   CO2 18 (L) 10/23/2019   Lab Results  Component Value Date   ALT 18 10/23/2019   AST 15 10/23/2019   ALKPHOS 80 10/23/2019   BILITOT 0.4 10/23/2019   Lab Results  Component Value Date   HGBA1C 5.5 10/23/2019   HGBA1C 6.1 (H) 06/19/2019   Lab Results  Component Value Date   INSULIN 18.2 10/23/2019   INSULIN 13.7 07/02/2019   Lab Results  Component Value Date   TSH 3.060 07/02/2019   Lab Results  Component Value Date   CHOL 181 10/23/2019   HDL 43 10/23/2019   LDLCALC 121 (H) 10/23/2019   TRIG 95 10/23/2019   Lab Results  Component Value Date   WBC 12.1 (H) 08/29/2019   HGB 13.6 08/29/2019   HCT 41.3 08/29/2019   MCV 91.0 08/29/2019   PLT 211 08/29/2019   No results found for: IRON, TIBC, FERRITIN  Attestation Statements:   Reviewed by clinician on day of visit: allergies, medications, problem list, medical history, surgical history, family history, social history, and previous encounter notes.  Time spent on visit including pre-visit chart review and post-visit charting and care was 29 minutes.   I, 10/29/2019, am acting as [employer]na [employer] for Energy manager, NP-C   I have reviewed the above documentation for accuracy and completeness, and I agree with the above. -  [employer], NP

## 2019-11-21 ENCOUNTER — Encounter (INDEPENDENT_AMBULATORY_CARE_PROVIDER_SITE_OTHER): Payer: Self-pay | Admitting: Family Medicine

## 2019-11-21 ENCOUNTER — Ambulatory Visit (INDEPENDENT_AMBULATORY_CARE_PROVIDER_SITE_OTHER): Payer: BC Managed Care – PPO | Admitting: Family Medicine

## 2019-11-21 ENCOUNTER — Other Ambulatory Visit: Payer: Self-pay

## 2019-11-21 VITALS — BP 112/75 | HR 95 | Temp 97.8°F | Ht 67.0 in | Wt 215.0 lb

## 2019-11-21 DIAGNOSIS — E559 Vitamin D deficiency, unspecified: Secondary | ICD-10-CM

## 2019-11-21 DIAGNOSIS — K3184 Gastroparesis: Secondary | ICD-10-CM

## 2019-11-21 DIAGNOSIS — E669 Obesity, unspecified: Secondary | ICD-10-CM

## 2019-11-21 DIAGNOSIS — Z9189 Other specified personal risk factors, not elsewhere classified: Secondary | ICD-10-CM | POA: Diagnosis not present

## 2019-11-21 DIAGNOSIS — R7303 Prediabetes: Secondary | ICD-10-CM

## 2019-11-21 DIAGNOSIS — Z6833 Body mass index (BMI) 33.0-33.9, adult: Secondary | ICD-10-CM

## 2019-11-21 DIAGNOSIS — I10 Essential (primary) hypertension: Secondary | ICD-10-CM | POA: Diagnosis not present

## 2019-11-21 MED ORDER — VITAMIN D (ERGOCALCIFEROL) 1.25 MG (50000 UNIT) PO CAPS: 50000.0000 [IU] | ORAL_CAPSULE | ORAL | 0 refills | Status: DC

## 2019-11-21 NOTE — Progress Notes (Signed)
Chief Complaint:   OBESITY Shelley Ryan is here to discuss her progress with her obesity treatment plan along with follow-up of her obesity related diagnoses. Shelley Ryan is on following a lower carbohydrate, vegetable and lean protein rich diet plan and states she is following her eating plan approximately 90% of the time. Shelley Ryan states she is exercising for 0 minutes 0 times per week.  Today's visit was #: 10 Starting weight: 242 lbs Starting date: 07/02/2019 Today's weight: 215 lbs Today's date: 11/22/2019 Total lbs lost to date: 27 lbs Total lbs lost since last in-office visit: 3 lbs  Interim History: Shelley Ryan says she will be traveling over the next 5 days for vacation.  She is worried about eating while on vacation.  She was previously on Category 2 in early June, but plateaued and was placed on the low carb plan starting June 29.  She says that following the plan has been easy.  She says she does not even have an appetite due to gastroparesis.  She says she has "always been like this".  Subjective:   1. Essential hypertension Review: taking medications as instructed, no medication side effects noted, no chest pain on exertion, no dyspnea on exertion, no swelling of ankles.  Blood pressure at home runs 115/70s consistently.  She is on losartan per her PCP.  BP Readings from Last 3 Encounters:  11/21/19 112/75  11/05/19 112/77  10/23/19 109/72   2. Vitamin D deficiency Shelley Ryan's Vitamin D level was 38.6 on 10/23/2019. She is currently taking prescription vitamin D 50,000 IU each week. She denies nausea, vomiting or muscle weakness.  3. Prediabetes Shelley Ryan has a diagnosis of prediabetes based on her elevated HgA1c and was informed this puts her at greater risk of developing diabetes. She continues to work on diet and exercise to decrease her risk of diabetes. She denies nausea or hypoglycemia.  On in February 2021, A1c was 6.1, now 5.5.  Her PCP has her on metformin.  Lab Results  Component Value  Date   HGBA1C 5.5 10/23/2019   Lab Results  Component Value Date   INSULIN 18.2 10/23/2019   INSULIN 13.7 07/02/2019   4. Gastroparesis Chronic.  She is treated by GI for this.  She has no appetite due to this condition.  5. At risk for osteoporosis Shelley Ryan is at higher risk of osteopenia and osteoporosis due to Vitamin D deficiency.   Assessment/Plan:   1. Essential hypertension Loyalty is working on healthy weight loss and exercise to improve blood pressure control. We will watch for signs of hypotension as she continues her lifestyle modifications.  Blood pressure is at goal.  Continue medication per PCP.  Will continue monitoring as patient continues her weight loss.  2. Vitamin D deficiency Low Vitamin D level contributes to fatigue and are associated with obesity, breast, and colon cancer. She agrees to continue to take prescription Vitamin D @50 ,000 IU every week and will follow-up for routine testing of Vitamin D, at least 2-3 times per year to avoid over-replacement. - Vitamin D, Ergocalciferol, (DRISDOL) 1.25 MG (50000 UNIT) CAPS capsule; Take 1 capsule (50,000 Units total) by mouth every 7 (seven) days.  Dispense: 4 capsule; Refill: 0  3. Prediabetes Tagan will continue to work on weight loss, exercise, and decreasing simple carbohydrates to help decrease the risk of diabetes.  I informed the patient that the metformin may be adding to her decreased appetite and if she has increased concerns about that, she should discuss with her  PCP possible change in therapy.  Continue prudent nutritional plan, weight loss.   4. Gastroparesis Treatment plan per PCP/GI.  To help with GERD symptoms, avoid triggers and caffeine/coffee intake.  5. At risk for osteoporosis Shelley Ryan was given approximately 15 minutes of osteoporosis prevention counseling today. Shelley Ryan is at risk for osteopenia and osteoporosis due to her Vitamin D deficiency. She was encouraged to take her Vitamin D and follow her  higher calcium diet and increase strengthening exercise to help strengthen her bones and decrease her risk of osteopenia and osteoporosis.  Repetitive spaced learning was employed today to elicit superior memory formation and behavioral change.  6. Class 1 obesity with serious comorbidity and body mass index (BMI) of 33.0 to 33.9 in adult, unspecified obesity type Shelley Ryan is currently in the action stage of change. As such, her goal is to continue with weight loss efforts. She has agreed to following a lower carbohydrate, vegetable and lean protein rich diet plan.   Exercise goals: As is.  Behavioral modification strategies: increasing water intake, meal planning and cooking strategies, travel eating strategies, celebration eating strategies and planning for success.  Shelley Ryan has agreed to follow-up with our clinic in 2 weeks. She was informed of the importance of frequent follow-up visits to maximize her success with intensive lifestyle modifications for her multiple health conditions.   Objective:   Blood pressure 112/75, pulse 95, temperature 97.8 F (36.6 C), height 5\' 7"  (1.702 m), weight (!) 215 lb (97.5 kg), SpO2 98 %. Body mass index is 33.67 kg/m.  General: Cooperative, alert, well developed, in no acute distress. HEENT: Conjunctivae and lids unremarkable. Cardiovascular: Regular rhythm.  Lungs: Normal work of breathing. Neurologic: No focal deficits.   Lab Results  Component Value Date   CREATININE 0.86 10/23/2019   BUN 22 10/23/2019   NA 139 10/23/2019   K 4.1 10/23/2019   CL 107 (H) 10/23/2019   CO2 18 (L) 10/23/2019   Lab Results  Component Value Date   ALT 18 10/23/2019   AST 15 10/23/2019   ALKPHOS 80 10/23/2019   BILITOT 0.4 10/23/2019   Lab Results  Component Value Date   HGBA1C 5.5 10/23/2019   HGBA1C 6.1 (H) 06/19/2019   Lab Results  Component Value Date   INSULIN 18.2 10/23/2019   INSULIN 13.7 07/02/2019   Lab Results  Component Value Date   TSH  3.060 07/02/2019   Lab Results  Component Value Date   CHOL 181 10/23/2019   HDL 43 10/23/2019   LDLCALC 121 (H) 10/23/2019   TRIG 95 10/23/2019   Lab Results  Component Value Date   WBC 12.1 (H) 08/29/2019   HGB 13.6 08/29/2019   HCT 41.3 08/29/2019   MCV 91.0 08/29/2019   PLT 211 08/29/2019   Attestation Statements:   Reviewed by clinician on day of visit: allergies, medications, problem list, medical history, surgical history, family history, social history, and previous encounter notes.  I, 10/29/2019, CMA, am acting as Insurance claims handler for Energy manager, DO.  I have reviewed the above documentation for accuracy and completeness, and I agree with the above. [employer], DO

## 2019-12-05 ENCOUNTER — Other Ambulatory Visit: Payer: Self-pay

## 2019-12-05 ENCOUNTER — Ambulatory Visit (INDEPENDENT_AMBULATORY_CARE_PROVIDER_SITE_OTHER): Payer: BC Managed Care – PPO | Admitting: Adult Health

## 2019-12-05 VITALS — BP 118/86 | HR 101 | Temp 98.2°F | Ht 67.0 in | Wt 219.0 lb

## 2019-12-05 DIAGNOSIS — E669 Obesity, unspecified: Secondary | ICD-10-CM | POA: Diagnosis not present

## 2019-12-05 DIAGNOSIS — Z6834 Body mass index (BMI) 34.0-34.9, adult: Secondary | ICD-10-CM

## 2019-12-05 DIAGNOSIS — I10 Essential (primary) hypertension: Secondary | ICD-10-CM

## 2019-12-05 DIAGNOSIS — E559 Vitamin D deficiency, unspecified: Secondary | ICD-10-CM | POA: Diagnosis not present

## 2019-12-05 DIAGNOSIS — Z9189 Other specified personal risk factors, not elsewhere classified: Secondary | ICD-10-CM | POA: Diagnosis not present

## 2019-12-05 MED ORDER — LOSARTAN POTASSIUM 25 MG PO TABS: 25.0000 mg | ORAL_TABLET | Freq: Every day | ORAL | 0 refills | Status: DC

## 2019-12-10 NOTE — Progress Notes (Signed)
Chief Complaint:   OBESITY Shelley Ryan is here to discuss her progress with her obesity treatment plan along with follow-up of her obesity related diagnoses. Shelley Ryan is following a lower carbohydrate, vegetable and lean protein rich diet plan and states she is following her eating plan approximately 50% of the time. Shelley Ryan states she is walking 30 minutes 5 times per week.  Today's visit was #: 11 Starting weight: 242 lbs Starting date: 07/02/2019 Today's weight: 219 lbs Today's date: 12/05/2019 Total lbs lost to date: 23 Total lbs lost since last in-office visit: 0  Interim History: Shelley Ryan went on vacation to [place], [place] and enjoyed her vacation! She is missing fruits on the low carbohydrate plan and would like to convert back to a category plan. When working, she will get up hourly and walk ~10 minutes during her office days. Her ultimate goal is to reach 160-170 lbs.  Subjective:   Essential hypertension. Blood pressure is at goal today. Shelley Ryan is on losartan 50 mg 1/2 tabs daily. Ambulatory blood pressure readings - systolic blood pressure 110's with diastolic blood pressure in the 70's. She denies cardiac symptoms. She reports increased fatigue and wonders if it is related to ARB.  BP Readings from Last 3 Encounters:  12/05/19 118/86  11/21/19 112/75  11/05/19 112/77   Lab Results  Component Value Date   CREATININE 0.86 10/23/2019   CREATININE 0.81 08/29/2019   CREATININE 0.90 08/28/2019   Vitamin D deficiency. Shelley Ryan is on Ergocalciferol. No nausea, vomiting, or muscle weakness.    Ref. Range 10/23/2019 16:01  Vitamin D, 25-Hydroxy Latest Ref Range: 30.0 - 100.0 ng/mL 38.6   At risk for osteoporosis. Shelley Ryan is at higher risk of osteopenia and osteoporosis due to Vitamin D deficiency and obesity.   Assessment/Plan:   Essential hypertension. [name] is working on healthy weight loss and exercise to improve blood pressure control. We will watch for signs  of hypotension as she continues her lifestyle modifications. Refill was given for losartan (COZAAR) 25 MG tablet daily #30 with 0 refills. [name] will check her blood pressure at home and bring log to her next office visit.  Vitamin D deficiency. Low Vitamin D level contributes to fatigue and are associated with obesity, breast, and colon cancer. She agrees to continue to take prescription Ergocalciferol as directed (no medication refill today) and will follow-up for routine testing of Vitamin D every 3 months.  At risk for osteoporosis. Shelley Ryan was given approximately 15 minutes of osteoporosis prevention counseling today. Shelley Ryan is at risk for osteopenia and osteoporosis due to her Vitamin D deficiency. She was encouraged to take her Vitamin D and follow her higher calcium diet and increase strengthening exercise to help strengthen her bones and decrease her risk of osteopenia and osteoporosis.  Repetitive spaced learning was employed today to elicit superior memory formation and behavioral change.  Class 1 obesity with serious comorbidity and body mass index (BMI) of 34.0 to 34.9 in adult, unspecified obesity type.  Shelley Ryan is currently in the action stage of change. As such, her goal is to continue with weight loss efforts. She has agreed to convert back to following the Category 2 Plan.   Handout was provided on Additional Breakfast Options.  Exercise goals: Shelley Ryan will continue her current exercise regimen.   Behavioral modification strategies: increasing lean protein intake, increasing water intake, no skipping meals and planning for success.  Shelley Ryan has agreed to follow-up with our clinic in 2 weeks. She was informed  of the importance of frequent follow-up visits to maximize her success with intensive lifestyle modifications for her multiple health conditions.   Objective:   Blood pressure 118/86, pulse (!) 101, temperature 98.2 F (36.8 C), height 5\' 7"  (1.702 m), weight 219 lb (99.3 kg),  SpO2 98 %. Body mass index is 34.3 kg/m.  General: Cooperative, alert, well developed, in no acute distress. HEENT: Conjunctivae and lids unremarkable. Cardiovascular: Regular rhythm.  Lungs: Normal work of breathing. Neurologic: No focal deficits.   Lab Results  Component Value Date   CREATININE 0.86 10/23/2019   BUN 22 10/23/2019   NA 139 10/23/2019   K 4.1 10/23/2019   CL 107 (H) 10/23/2019   CO2 18 (L) 10/23/2019   Lab Results  Component Value Date   ALT 18 10/23/2019   AST 15 10/23/2019   ALKPHOS 80 10/23/2019   BILITOT 0.4 10/23/2019   Lab Results  Component Value Date   HGBA1C 5.5 10/23/2019   HGBA1C 6.1 (H) 06/19/2019   Lab Results  Component Value Date   INSULIN 18.2 10/23/2019   INSULIN 13.7 07/02/2019   Lab Results  Component Value Date   TSH 3.060 07/02/2019   Lab Results  Component Value Date   CHOL 181 10/23/2019   HDL 43 10/23/2019   LDLCALC 121 (H) 10/23/2019   TRIG 95 10/23/2019   Lab Results  Component Value Date   WBC 12.1 (H) 08/29/2019   HGB 13.6 08/29/2019   HCT 41.3 08/29/2019   MCV 91.0 08/29/2019   PLT 211 08/29/2019   No results found for: IRON, TIBC, FERRITIN  Attestation Statements:   Reviewed by clinician on day of visit: allergies, medications, problem list, medical history, surgical history, family history, social history, and previous encounter notes.  I, 10/29/2019, am acting as [employer] for Energy manager, NP-C   I have reviewed the above documentation for accuracy and completeness, and I agree with the above. -  [employer], NP

## 2019-12-16 ENCOUNTER — Other Ambulatory Visit (INDEPENDENT_AMBULATORY_CARE_PROVIDER_SITE_OTHER): Payer: Self-pay | Admitting: Family Medicine

## 2019-12-16 DIAGNOSIS — E559 Vitamin D deficiency, unspecified: Secondary | ICD-10-CM

## 2019-12-20 ENCOUNTER — Encounter (INDEPENDENT_AMBULATORY_CARE_PROVIDER_SITE_OTHER): Payer: Self-pay | Admitting: Adult Health

## 2019-12-20 ENCOUNTER — Other Ambulatory Visit: Payer: Self-pay

## 2019-12-20 ENCOUNTER — Ambulatory Visit (INDEPENDENT_AMBULATORY_CARE_PROVIDER_SITE_OTHER): Payer: BC Managed Care – PPO | Admitting: Adult Health

## 2019-12-20 VITALS — BP 137/84 | HR 94 | Temp 98.4°F | Ht 67.0 in | Wt 215.0 lb

## 2019-12-20 DIAGNOSIS — I10 Essential (primary) hypertension: Secondary | ICD-10-CM

## 2019-12-20 DIAGNOSIS — Z9189 Other specified personal risk factors, not elsewhere classified: Secondary | ICD-10-CM

## 2019-12-20 DIAGNOSIS — E559 Vitamin D deficiency, unspecified: Secondary | ICD-10-CM | POA: Diagnosis not present

## 2019-12-20 DIAGNOSIS — Z6833 Body mass index (BMI) 33.0-33.9, adult: Secondary | ICD-10-CM

## 2019-12-20 DIAGNOSIS — R7303 Prediabetes: Secondary | ICD-10-CM

## 2019-12-20 DIAGNOSIS — E669 Obesity, unspecified: Secondary | ICD-10-CM

## 2019-12-20 MED ORDER — VITAMIN D (ERGOCALCIFEROL) 1.25 MG (50000 UNIT) PO CAPS: 50000.0000 [IU] | ORAL_CAPSULE | ORAL | 0 refills | Status: DC

## 2019-12-24 NOTE — Progress Notes (Signed)
Chief Complaint:   OBESITY Shelley Ryan is here to discuss her progress with her obesity treatment plan along with follow-up of her obesity related diagnoses. Shelley Ryan is on the Category 2 Plan and states she is following her eating plan approximately 80% of the time. Shelley Ryan states she is walking 20 minutes 7 times per week.  Today's visit was #: 12 Starting weight: 242 lbs Starting date: 07/02/2019 Today's weight: 215 lbs Today's date: 12/20/2019 Total lbs lost to date: 27 Total lbs lost since last in-office visit: 4  Interim History: Shelley Ryan has been focusing on lean protein at meals and increased walking daily. She continues to enjoy the foods and structure of the Category 2 meal plan. Due to hunger, she has increased nighttime dinner protein to 9 oz. Her ultimate goal is to lose down to 160-165 lbs.  Subjective:   Vitamin D deficiency. Shelley Ryan is on Ergocalciferol. No nausea, vomiting, or muscle weakness.    Ref. Range 10/23/2019 16:01  Vitamin D, 25-Hydroxy Latest Ref Range: 30.0 - 100.0 ng/mL 38.6   Prediabetes. Shelley Ryan has a diagnosis of prediabetes based on her elevated HgA1c and was informed this puts her at greater risk of developing diabetes. She continues to work on diet and exercise to decrease her risk of diabetes. She denies nausea or hypoglycemia. Shelley Ryan is on metformin 500 mg BID and is managed by her PCP.  Lab Results  Component Value Date   HGBA1C 5.5 10/23/2019   Lab Results  Component Value Date   INSULIN 18.2 10/23/2019   INSULIN 13.7 07/02/2019   Essential hypertension. Shelley Ryan has been monitoring her blood pressure at home with systolic blood pressures 120-130 and diastolic blood pressures 70-80 without losartan 25 mg daily. She denies chest pain, dyspnea, and palpitations. She will occasionally experience dizziness with position changes.  BP Readings from Last 3 Encounters:  12/20/19 137/84  12/05/19 118/86  11/21/19 112/75   Lab Results  Component  Value Date   CREATININE 0.86 10/23/2019   CREATININE 0.81 08/29/2019   CREATININE 0.90 08/28/2019   At risk for complication associated with hypotension. The patient is at a higher than average risk of hypotension due to steady weight loss and is on low dose losartan.  Assessment/Plan:   Vitamin D deficiency. Low Vitamin D level contributes to fatigue and are associated with obesity, breast, and colon cancer. She was given a refill on her Vitamin D, Ergocalciferol, (DRISDOL) 1.25 MG (50000 UNIT) CAPS capsule every week #4 with 0 refills and will follow-up for routine testing of Vitamin D every 3 months.   Prediabetes. Shelley Ryan will continue to work on weight loss, exercise, and decreasing simple carbohydrates to help decrease the risk of diabetes. She will continue metformin as directed and continue to follow the Category 2 meal plan. Labs will be checked every 3 months.   Essential hypertension. Shelley Ryan is working on healthy weight loss and exercise to improve blood pressure control. We will watch for signs of hypotension as she continues her lifestyle modifications. Shelley Ryan will continue to closely monitor her blood pressure, remain well hydrated, and will change positions slowly. If ambulatory systolic blood pressure is consistently 100's or hypotension worsens, will take 1/2 losartan 25 mg. Labs will be checked every 3 months.   At risk for complication associated with hypotension. Shelley Ryan was given approximately 5 minutes of education and counseling today to help avoid hypotension. We discussed risks of hypotension with weight loss and signs of hypotension such as feeling lightheaded  or unsteady.  Repetitive spaced learning was employed today to elicit superior memory formation and behavioral change.  Class 1 obesity with serious comorbidity and body mass index (BMI) of 33.0 to 33.9 in adult, unspecified obesity type.  Shelley Ryan is currently in the action stage of change. As such, her goal is to  continue with weight loss efforts. She has agreed to the Category 2 Plan.   Exercise goals: Shelley Ryan will continue walking 20 minutes daily for exercise.  Behavioral modification strategies: increasing lean protein intake, meal planning and cooking strategies and planning for success.  Shelley Ryan has agreed to follow-up with our clinic in 2 weeks. She was informed of the importance of frequent follow-up visits to maximize her success with intensive lifestyle modifications for her multiple health conditions.   Objective:   Blood pressure 137/84, pulse 94, temperature 98.4 F (36.9 C), temperature source Oral, height 5\' 7"  (1.702 m), weight 215 lb (97.5 kg), SpO2 100 %. Body mass index is 33.67 kg/m.  General: Cooperative, alert, well developed, in no acute distress. HEENT: Conjunctivae and lids unremarkable. Cardiovascular: Regular rhythm.  Lungs: Normal work of breathing. Neurologic: No focal deficits.   Lab Results  Component Value Date   CREATININE 0.86 10/23/2019   BUN 22 10/23/2019   NA 139 10/23/2019   K 4.1 10/23/2019   CL 107 (H) 10/23/2019   CO2 18 (L) 10/23/2019   Lab Results  Component Value Date   ALT 18 10/23/2019   AST 15 10/23/2019   ALKPHOS 80 10/23/2019   BILITOT 0.4 10/23/2019   Lab Results  Component Value Date   HGBA1C 5.5 10/23/2019   HGBA1C 6.1 (H) 06/19/2019   Lab Results  Component Value Date   INSULIN 18.2 10/23/2019   INSULIN 13.7 07/02/2019   Lab Results  Component Value Date   TSH 3.060 07/02/2019   Lab Results  Component Value Date   CHOL 181 10/23/2019   HDL 43 10/23/2019   LDLCALC 121 (H) 10/23/2019   TRIG 95 10/23/2019   Lab Results  Component Value Date   WBC 12.1 (H) 08/29/2019   HGB 13.6 08/29/2019   HCT 41.3 08/29/2019   MCV 91.0 08/29/2019   PLT 211 08/29/2019   No results found for: IRON, TIBC, FERRITIN  Attestation Statements:   Reviewed by clinician on day of visit: allergies, medications, problem list, medical  history, surgical history, family history, social history, and previous encounter notes.  I, 10/29/2019, am acting as [employer]na [employer] for Energy manager, NP-C   I have reviewed the above documentation for accuracy and completeness, and I agree with the above. -  [employer], NP

## 2020-01-03 ENCOUNTER — Other Ambulatory Visit: Payer: Self-pay

## 2020-01-03 ENCOUNTER — Ambulatory Visit (INDEPENDENT_AMBULATORY_CARE_PROVIDER_SITE_OTHER): Payer: BC Managed Care – PPO | Admitting: Adult Health

## 2020-01-03 ENCOUNTER — Encounter (INDEPENDENT_AMBULATORY_CARE_PROVIDER_SITE_OTHER): Payer: Self-pay | Admitting: Adult Health

## 2020-01-03 VITALS — BP 116/80 | HR 94 | Temp 98.5°F | Ht 67.0 in | Wt 213.0 lb

## 2020-01-03 DIAGNOSIS — E559 Vitamin D deficiency, unspecified: Secondary | ICD-10-CM

## 2020-01-03 DIAGNOSIS — Z6833 Body mass index (BMI) 33.0-33.9, adult: Secondary | ICD-10-CM | POA: Diagnosis not present

## 2020-01-03 DIAGNOSIS — I10 Essential (primary) hypertension: Secondary | ICD-10-CM | POA: Diagnosis not present

## 2020-01-03 DIAGNOSIS — E669 Obesity, unspecified: Secondary | ICD-10-CM | POA: Diagnosis not present

## 2020-01-04 ENCOUNTER — Other Ambulatory Visit (INDEPENDENT_AMBULATORY_CARE_PROVIDER_SITE_OTHER): Payer: Self-pay | Admitting: Adult Health

## 2020-01-04 DIAGNOSIS — I10 Essential (primary) hypertension: Secondary | ICD-10-CM

## 2020-01-07 NOTE — Progress Notes (Signed)
Chief Complaint:   OBESITY Shelley Ryan is here to discuss her progress with her obesity treatment plan along with follow-up of her obesity related diagnoses. Shelley Ryan is on the Category 2 Plan and states she is following her eating plan approximately 80% of the time. Shelley Ryan states she is walking for 30 minutes 7 times per week.  Today's visit was #: 13 Starting weight: 242 lbs Starting date: 07/02/2019 Today's weight: 213 lbs Today's date: 01/03/2020 Total lbs lost to date: 29 lbs Total lbs lost since last in-office visit: 2 lbs  Interim History: Shelley Ryan continues to enjoy the foods and flexibility on the Category 2 meal plan.  She will make minor alterations to meals, however, she is sure to consume plenty of protein at each meal.  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  Subjective:   1. Essential hypertension Review: taking medications as instructed, no medication side effects noted, no chest pain on exertion, no dyspnea on exertion, no swelling of ankles.  Blood pressure is stable in the office today.  She is on losartan 25 mg daily.  She denies acute cardiac symptoms.  She will occasionally experience dizziness with rapid position change.  Ambulatory blood pressure - SBP 110s, DBP 70-80s.   BP Readings from Last 3 Encounters:  01/03/20 116/80  12/20/19 137/84  12/05/19 118/86   2. Vitamin D deficiency Shelley Ryan's Vitamin D level was 38.6 on 10/23/2019. She is currently taking prescription vitamin D 50,000 IU each week. She denies nausea, vomiting or muscle weakness.  Vitamin D level was below goal.    Assessment/Plan:   1. Essential hypertension Shelley Ryan is working on healthy weight loss and exercise to improve blood pressure control. We will watch for signs of hypotension as she continues her lifestyle modifications.  Continue to closely monitor blood pressure and for symptoms of hypotension.  2. Vitamin D deficiency Low Vitamin D level contributes to fatigue and are associated  with obesity, breast, and colon cancer. She agrees to continue to take prescription Vitamin D @50 ,000 IU every week and will follow-up for routine testing of Vitamin D, at least 2-3 times per year to avoid over-replacement.  Check labs at the end of September.  3. Class 1 obesity with serious comorbidity and body mass index (BMI) of 33.0 to 33.9 in adult, unspecified obesity type Shelley Ryan is currently in the action stage of change. As such, her goal is to continue with weight loss efforts. She has agreed to the Category 2 Plan.   Exercise goals: Walking for 30 minutes 7 times per week.  Behavioral modification strategies: increasing lean protein intake, decreasing simple carbohydrates, meal planning and cooking strategies and planning for success.  Shelley Ryan has agreed to follow-up with our clinic in 2 weeks. She was informed of the importance of frequent follow-up visits to maximize her success with intensive lifestyle modifications for her multiple health conditions.   Objective:   Blood pressure 116/80, pulse 94, temperature 98.5 F (36.9 C), temperature source Oral, height 5\' 7"  (1.702 m), weight 213 lb (96.6 kg), SpO2 98 %. Body mass index is 33.36 kg/m.  General: Cooperative, alert, well developed, in no acute distress. HEENT: Conjunctivae and lids unremarkable. Cardiovascular: Regular rhythm.  Lungs: Normal work of breathing. Neurologic: No focal deficits.   Lab Results  Component Value Date   CREATININE 0.86 10/23/2019   BUN 22 10/23/2019   NA 139 10/23/2019   K 4.1 10/23/2019   CL 107 (H) 10/23/2019   CO2 18 (L)  10/23/2019   Lab Results  Component Value Date   ALT 18 10/23/2019   AST 15 10/23/2019   ALKPHOS 80 10/23/2019   BILITOT 0.4 10/23/2019   Lab Results  Component Value Date   HGBA1C 5.5 10/23/2019   HGBA1C 6.1 (H) 06/19/2019   Lab Results  Component Value Date   INSULIN 18.2 10/23/2019   INSULIN 13.7 07/02/2019   Lab Results  Component Value Date   TSH  3.060 07/02/2019   Lab Results  Component Value Date   CHOL 181 10/23/2019   HDL 43 10/23/2019   LDLCALC 121 (H) 10/23/2019   TRIG 95 10/23/2019   Lab Results  Component Value Date   WBC 12.1 (H) 08/29/2019   HGB 13.6 08/29/2019   HCT 41.3 08/29/2019   MCV 91.0 08/29/2019   PLT 211 08/29/2019   Attestation Statements:   Reviewed by clinician on day of visit: allergies, medications, problem list, medical history, surgical history, family history, social history, and previous encounter notes.  Time spent on visit including pre-visit chart review and post-visit care and charting was 27 minutes.   I, Insurance claims handler, CMA, am acting as Energy manager for William Hamburger, NP.  I have reviewed the above documentation for accuracy and completeness, and I agree with the above. -  Julaine Fusi, NP

## 2020-01-16 ENCOUNTER — Other Ambulatory Visit (INDEPENDENT_AMBULATORY_CARE_PROVIDER_SITE_OTHER): Payer: Self-pay | Admitting: Adult Health

## 2020-01-16 DIAGNOSIS — E559 Vitamin D deficiency, unspecified: Secondary | ICD-10-CM

## 2020-01-21 ENCOUNTER — Encounter (INDEPENDENT_AMBULATORY_CARE_PROVIDER_SITE_OTHER): Payer: Self-pay | Admitting: Adult Health

## 2020-01-21 ENCOUNTER — Ambulatory Visit (INDEPENDENT_AMBULATORY_CARE_PROVIDER_SITE_OTHER): Payer: BC Managed Care – PPO | Admitting: Adult Health

## 2020-01-21 ENCOUNTER — Other Ambulatory Visit: Payer: Self-pay

## 2020-01-21 VITALS — BP 108/77 | HR 87 | Temp 98.2°F | Ht 67.0 in | Wt 212.0 lb

## 2020-01-21 DIAGNOSIS — E669 Obesity, unspecified: Secondary | ICD-10-CM

## 2020-01-21 DIAGNOSIS — Z6833 Body mass index (BMI) 33.0-33.9, adult: Secondary | ICD-10-CM | POA: Diagnosis not present

## 2020-01-21 DIAGNOSIS — G43109 Migraine with aura, not intractable, without status migrainosus: Secondary | ICD-10-CM | POA: Diagnosis not present

## 2020-01-21 DIAGNOSIS — I10 Essential (primary) hypertension: Secondary | ICD-10-CM

## 2020-01-21 MED ORDER — LOSARTAN POTASSIUM 25 MG PO TABS: ORAL_TABLET | ORAL | 0 refills | Status: DC

## 2020-01-22 NOTE — Progress Notes (Signed)
Chief Complaint:   OBESITY Shelley Ryan is here to discuss her progress with her obesity treatment plan along with follow-up of her obesity related diagnoses. [name] is on the Category 2 Plan and states she is following her eating plan approximately 80% of the time. [name] states she is walking for 30 minutes 5 times per week.  Today's visit was #: 14 Starting weight: 242 lbs Starting date: 07/02/2019 Today's weight: 212 lbs Today's date: 01/21/2020 Total lbs lost to date: 30 lbs Total lbs lost since last in-office visit: 1 lb  Interim History: Shelley Ryan says that [facility] is slowly weaning her off topiramate.  She estimates 1 migraine headache per month.  She will follow-up with [facility] in 8 months.  She continues to enjoy the foods and structure of the Category 2 meal plan.  She has really been focusing on protein intake at every meal.  Subjective:   1. Essential hypertension Review: taking medications as instructed, no medication side effects noted, no chest pain on exertion, no dyspnea on exertion, no swelling of ankles.  Blood pressure is a little soft today.  She is on losartan 25 mg daily.  Ambulatory blood pressure - SBP 100, DBP 70s.  She has been experiencing dizziness with position changes.  BP Readings from Last 3 Encounters:  01/21/20 108/77  01/03/20 116/80  12/20/19 137/84   2. Migraine with aura and without status migrainosus, not intractable Followed by [facility].  She is being weaned off topiramate 50 mg daily for 2 weeks then stop.  Also Maxalt and Bernita Raisin were discontinued at last Neurology appointment.  Assessment/Plan:   1. Essential hypertension Pallie is working on healthy weight loss and exercise to improve blood pressure control. We will watch for signs of hypotension as she continues her lifestyle modifications.  Reduce losartan 25 mg to 1/2 tablet daily.  No need for refill.  Check blood pressure/heart rate at home.  Bring readings to next office  visit.  - losartan (COZAAR) 25 MG tablet; 1/2 tablet by mouth once daily  Dispense: 30 tablet; Refill: 0  2. Migraine with aura and without status migrainosus, not intractable Continue medication regimen per Neurology.  Follow-up in 8 months.  3. Class 1 obesity with serious comorbidity and body mass index (BMI) of 33.0 to 33.9 in adult, unspecified obesity type  Shelley Ryan is currently in the action stage of change. As such, her goal is to continue with weight loss efforts. She has agreed to the Category 2 Plan.   Exercise goals: Walking for 30 minutes 5 times per week.  Behavioral modification strategies: increasing lean protein intake, increasing water intake, no skipping meals, meal planning and cooking strategies and planning for success.  Shelley Ryan has agreed to follow-up with our clinic in 2 weeks. She was informed of the importance of frequent follow-up visits to maximize her success with intensive lifestyle modifications for her multiple health conditions.   Objective:   Blood pressure 108/77, pulse 87, temperature 98.2 F (36.8 C), height 5\' 7"  (1.702 m), weight 212 lb (96.2 kg), SpO2 98 %. Body mass index is 33.2 kg/m.  General: Cooperative, alert, well developed, in no acute distress. HEENT: Conjunctivae and lids unremarkable. Cardiovascular: Regular rhythm.  Lungs: Normal work of breathing. Neurologic: No focal deficits.   Lab Results  Component Value Date   CREATININE 0.86 10/23/2019   BUN 22 10/23/2019   NA 139 10/23/2019   K 4.1 10/23/2019   CL 107 (H) 10/23/2019   CO2  18 (L) 10/23/2019   Lab Results  Component Value Date   ALT 18 10/23/2019   AST 15 10/23/2019   ALKPHOS 80 10/23/2019   BILITOT 0.4 10/23/2019   Lab Results  Component Value Date   HGBA1C 5.5 10/23/2019   HGBA1C 6.1 (H) 06/19/2019   Lab Results  Component Value Date   INSULIN 18.2 10/23/2019   INSULIN 13.7 07/02/2019   Lab Results  Component Value Date   TSH 3.060 07/02/2019   Lab  Results  Component Value Date   CHOL 181 10/23/2019   HDL 43 10/23/2019   LDLCALC 121 (H) 10/23/2019   TRIG 95 10/23/2019   Lab Results  Component Value Date   WBC 12.1 (H) 08/29/2019   HGB 13.6 08/29/2019   HCT 41.3 08/29/2019   MCV 91.0 08/29/2019   PLT 211 08/29/2019   Attestation Statements:   Reviewed by clinician on day of visit: allergies, medications, problem list, medical history, surgical history, family history, social history, and previous encounter notes.  Time spent on visit including pre-visit chart review and post-visit care and charting was 27 minutes.   I, Insurance claims handler, CMA, am acting as Energy manager for William Hamburger, NP.  I have reviewed the above documentation for accuracy and completeness, and I agree with the above. -  Julaine Fusi, NP

## 2020-01-23 DIAGNOSIS — G43109 Migraine with aura, not intractable, without status migrainosus: Secondary | ICD-10-CM | POA: Insufficient documentation

## 2020-02-04 ENCOUNTER — Encounter (INDEPENDENT_AMBULATORY_CARE_PROVIDER_SITE_OTHER): Payer: Self-pay | Admitting: Adult Health

## 2020-02-04 ENCOUNTER — Ambulatory Visit (INDEPENDENT_AMBULATORY_CARE_PROVIDER_SITE_OTHER): Payer: BC Managed Care – PPO | Admitting: Adult Health

## 2020-02-04 ENCOUNTER — Other Ambulatory Visit: Payer: Self-pay

## 2020-02-04 ENCOUNTER — Ambulatory Visit: Payer: BC Managed Care – PPO | Admitting: Adult Health

## 2020-02-04 VITALS — BP 123/80 | HR 101 | Temp 98.1°F | Ht 67.0 in | Wt 212.0 lb

## 2020-02-04 DIAGNOSIS — R7303 Prediabetes: Secondary | ICD-10-CM | POA: Diagnosis not present

## 2020-02-04 DIAGNOSIS — E669 Obesity, unspecified: Secondary | ICD-10-CM

## 2020-02-04 DIAGNOSIS — Z9189 Other specified personal risk factors, not elsewhere classified: Secondary | ICD-10-CM

## 2020-02-04 DIAGNOSIS — E782 Mixed hyperlipidemia: Secondary | ICD-10-CM | POA: Insufficient documentation

## 2020-02-04 DIAGNOSIS — E7849 Other hyperlipidemia: Secondary | ICD-10-CM

## 2020-02-04 DIAGNOSIS — Z6833 Body mass index (BMI) 33.0-33.9, adult: Secondary | ICD-10-CM

## 2020-02-04 DIAGNOSIS — E559 Vitamin D deficiency, unspecified: Secondary | ICD-10-CM | POA: Diagnosis not present

## 2020-02-04 DIAGNOSIS — I10 Essential (primary) hypertension: Secondary | ICD-10-CM | POA: Diagnosis not present

## 2020-02-04 MED ORDER — LOSARTAN POTASSIUM 25 MG PO TABS: ORAL_TABLET | ORAL | 0 refills | Status: DC

## 2020-02-04 NOTE — Progress Notes (Signed)
Chief Complaint:   OBESITY Shelley Ryan is here to discuss her progress with her obesity treatment plan along with follow-up of her obesity related diagnoses. Shelley Ryan is on the Category 2 Plan and states she is following her eating plan approximately 80% of the time. Shelley Ryan states she is walking 30 minutes 7 times per week.  Today's visit was #: 15 Starting weight: 242 lbs Starting date: 07/02/2019 Today's weight: 212 lbs Today's date: 02/04/2020 Total lbs lost to date: 30 Total lbs lost since last in-office visit: 0  Interim History: Despite traveling for work and celebrating her daughter's 15th birthday, Shelley Ryan maintained her weight - excellent! When eating off plan, she has been focusing on the healthiest protein choices and portion control. She has completed weaned off topiramate and feels that overal hunger levels have slightly increased.  Subjective:   Prediabetes. Shelley Ryan has a diagnosis of prediabetes based on her elevated HgA1c and was informed this puts her at greater risk of developing diabetes. She continues to work on diet and exercise to decrease her risk of diabetes. She denies nausea or hypoglycemia. 10/23/2019 blood glucose 96, A1c 5.5 with an insulin level of 18.2. Shelley Ryan is on metformin 500 mg BID, which she is tolerating well.  Lab Results  Component Value Date   HGBA1C 5.5 10/23/2019   Lab Results  Component Value Date   INSULIN 18.2 10/23/2019   INSULIN 13.7 07/02/2019   Essential hypertension. Ambulatory blood pressure - systolic blood pressure 140's; diastolic blood pressure 80's. Shelley Ryan increased her ARB dosage back to a full tablet. She denies cardiac symptoms.  BP Readings from Last 3 Encounters:  02/04/20 123/80  01/21/20 108/77  01/03/20 116/80   Lab Results  Component Value Date   CREATININE 0.86 10/23/2019   CREATININE 0.81 08/29/2019   CREATININE 0.90 08/28/2019   Vitamin D deficiency. Vitamin D level on 10/23/2019 was below goal at  38.6. Shelley Ryan is on Ergocalciferol. No nausea, vomiting, or muscle weakness.    Ref. Range 10/23/2019 16:01  Vitamin D, 25-Hydroxy Latest Ref Range: 30.0 - 100.0 ng/mL 38.6   Other hyperlipidemia, pure. 10/23/2019 LDL above goal at 121. Shelley Ryan is not on a statin.   Lab Results  Component Value Date   CHOL 181 10/23/2019   HDL 43 10/23/2019   LDLCALC 121 (H) 10/23/2019   TRIG 95 10/23/2019   Lab Results  Component Value Date   ALT 18 10/23/2019   AST 15 10/23/2019   ALKPHOS 80 10/23/2019   BILITOT 0.4 10/23/2019   The ASCVD Risk score Shelley George DC Jr., Shelley al., Shelley Ryan) failed to calculate for the following reasons:   The patient has a prior MI or stroke diagnosis  At risk for heart disease. Shelley Ryan is at a higher than average risk for cardiovascular disease due to hyperlipidemia and obesity.   Assessment/Plan:   Prediabetes.  Shelley Ryan will continue to work on weight loss, exercise, and decreasing simple carbohydrates to help decrease the risk of diabetes. She requested a metformin refill from her PCP. Labs will be checked today.  Essential hypertension. Shelley Ryan is working on healthy weight loss and exercise to improve blood pressure control. We will watch for signs of hypotension as she continues her lifestyle modifications. Refill was given for losartan (COZAAR) 25 MG tablet daily #30 with 0 refills. Labs will be checked today.  Vitamin D deficiency. Low Vitamin D level contributes to fatigue and are associated with obesity, breast, and colon cancer. VITAMIN D 25 Hydroxy (Vit-D  Deficiency, Fractures) level will be checked today.   Other hyperlipidemia, pure.  Cardiovascular risk and specific lipid/LDL goals reviewed.  We discussed several lifestyle modifications today and Scarlet will continue to work on diet, exercise and weight loss efforts. Orders and follow up as documented in patient record. Labs will be checked today.  Counseling Intensive lifestyle modifications are the first line treatment  for this issue. . Dietary changes: Increase soluble fiber. Decrease simple carbohydrates. . Exercise changes: Moderate to vigorous-intensity aerobic activity 150 minutes per week if tolerated.  . Lipid-lowering medications: see documented in medical record.  At risk for heart disease. Shelley Ryan was given approximately 15 minutes of coronary artery disease prevention counseling today. She is 44 y.o. female and has risk factors for heart disease including obesity. We discussed intensive lifestyle modifications today with an emphasis on specific weight loss instructions and strategies.   Repetitive spaced learning was employed today to elicit superior memory formation and behavioral change.  Class 1 obesity with serious comorbidity and body mass index (BMI) of 33.0 to 33.9 in adult, unspecified obesity type.  Shelley Ryan is currently in the action stage of change. As such, her goal is to continue with weight loss efforts. She has agreed to the Category 2 Plan.   Exercise goals: Shelley Ryan will continue walking 30 minutes 7 times per week.  Behavioral modification strategies: increasing lean protein intake, meal planning and cooking strategies and planning for success.  Shelley Ryan has agreed to follow-up with our clinic in 2 weeks. She was informed of the importance of frequent follow-up visits to maximize her success with intensive lifestyle modifications for her multiple health conditions.   Shelley Ryan was informed we would discuss her lab results at her next visit unless there is a critical issue that needs to be addressed sooner. Shelley Ryan agreed to keep her next visit at the agreed upon time to discuss these results.  Objective:   Blood pressure 123/80, pulse (!) 101, temperature 98.1 F (36.7 C), height 5\' 7"  (1.702 m), weight 212 lb (96.2 kg), SpO2 100 %. Body mass index is 33.2 kg/m.  General: Cooperative, alert, well developed, in no acute distress. HEENT: Conjunctivae and lids unremarkable. Cardiovascular:  Regular rhythm.  Lungs: Normal work of breathing. Neurologic: No focal deficits.   Lab Results  Component Value Date   CREATININE 0.86 10/23/2019   BUN 22 10/23/2019   NA 139 10/23/2019   K 4.1 10/23/2019   CL 107 (H) 10/23/2019   CO2 18 (L) 10/23/2019   Lab Results  Component Value Date   ALT 18 10/23/2019   AST 15 10/23/2019   ALKPHOS 80 10/23/2019   BILITOT 0.4 10/23/2019   Lab Results  Component Value Date   HGBA1C 5.5 10/23/2019   HGBA1C 6.1 (H) 06/19/2019   Lab Results  Component Value Date   INSULIN 18.2 10/23/2019   INSULIN 13.7 07/02/2019   Lab Results  Component Value Date   TSH 3.060 07/02/2019   Lab Results  Component Value Date   CHOL 181 10/23/2019   HDL 43 10/23/2019   LDLCALC 121 (H) 10/23/2019   TRIG 95 10/23/2019   Lab Results  Component Value Date   WBC 12.1 (H) 08/29/2019   HGB 13.6 08/29/2019   HCT 41.3 08/29/2019   MCV 91.0 08/29/2019   PLT 211 08/29/2019   No results found for: IRON, TIBC, FERRITIN  Attestation Statements:   Reviewed by clinician on day of visit: allergies, medications, problem list, medical history, surgical history, family history, social  history, and previous encounter notes.  I, Marianna Payment, am acting as Energy manager for [employer], NP-C   I have reviewed the above documentation for accuracy and completeness, and I agree with the above. -  Blaise Grieshaber d. Reem Fleury, NP-C

## 2020-02-05 LAB — VITAMIN D 25 HYDROXY (VIT D DEFICIENCY, FRACTURES): Vit D, 25-Hydroxy: 45.8 ng/mL (ref 30.0–100.0)

## 2020-02-05 LAB — COMPREHENSIVE METABOLIC PANEL
ALT: 20 IU/L (ref 0–32)
AST: 17 IU/L (ref 0–40)
Albumin/Globulin Ratio: 1.6 (ref 1.2–2.2)
Albumin: 4.2 g/dL (ref 3.8–4.8)
Alkaline Phosphatase: 81 IU/L (ref 44–121)
BUN/Creatinine Ratio: 24 — ABNORMAL HIGH (ref 9–23)
BUN: 20 mg/dL (ref 6–24)
Bilirubin Total: 0.3 mg/dL (ref 0.0–1.2)
CO2: 23 mmol/L (ref 20–29)
Calcium: 9.5 mg/dL (ref 8.7–10.2)
Chloride: 102 mmol/L (ref 96–106)
Creatinine, Ser: 0.83 mg/dL (ref 0.57–1.00)
GFR calc Af Amer: 98 mL/min/{1.73_m2} (ref >59)
GFR calc non Af Amer: 85 mL/min/{1.73_m2} (ref >59)
Globulin, Total: 2.6 g/dL (ref 1.5–4.5)
Glucose: 84 mg/dL (ref 65–99)
Potassium: 4.6 mmol/L (ref 3.5–5.2)
Sodium: 140 mmol/L (ref 134–144)
Total Protein: 6.8 g/dL (ref 6.0–8.5)

## 2020-02-05 LAB — LIPID PANEL
Chol/HDL Ratio: 4 ratio (ref 0.0–4.4)
Cholesterol, Total: 190 mg/dL (ref 100–199)
HDL: 47 mg/dL (ref >39)
LDL Chol Calc (NIH): 124 mg/dL — ABNORMAL HIGH (ref 0–99)
Triglycerides: 105 mg/dL (ref 0–149)
VLDL Cholesterol Cal: 19 mg/dL (ref 5–40)

## 2020-02-05 LAB — HEMOGLOBIN A1C
Est. average glucose Bld gHb Est-mCnc: 123 mg/dL
Hgb A1c MFr Bld: 5.9 % — ABNORMAL HIGH (ref 4.8–5.6)

## 2020-02-05 LAB — INSULIN, RANDOM: INSULIN: 10.9 u[IU]/mL (ref 2.6–24.9)

## 2020-02-10 ENCOUNTER — Other Ambulatory Visit (INDEPENDENT_AMBULATORY_CARE_PROVIDER_SITE_OTHER): Payer: Self-pay | Admitting: Adult Health

## 2020-02-10 DIAGNOSIS — E559 Vitamin D deficiency, unspecified: Secondary | ICD-10-CM

## 2020-02-18 ENCOUNTER — Other Ambulatory Visit: Payer: Self-pay

## 2020-02-18 ENCOUNTER — Encounter (INDEPENDENT_AMBULATORY_CARE_PROVIDER_SITE_OTHER): Payer: Self-pay | Admitting: Adult Health

## 2020-02-18 ENCOUNTER — Ambulatory Visit (INDEPENDENT_AMBULATORY_CARE_PROVIDER_SITE_OTHER): Payer: BC Managed Care – PPO | Admitting: Adult Health

## 2020-02-18 VITALS — BP 121/80 | HR 90 | Temp 97.9°F | Ht 67.0 in | Wt 213.0 lb

## 2020-02-18 DIAGNOSIS — R7303 Prediabetes: Secondary | ICD-10-CM | POA: Diagnosis not present

## 2020-02-18 DIAGNOSIS — Z6833 Body mass index (BMI) 33.0-33.9, adult: Secondary | ICD-10-CM

## 2020-02-18 DIAGNOSIS — E7849 Other hyperlipidemia: Secondary | ICD-10-CM

## 2020-02-18 DIAGNOSIS — E559 Vitamin D deficiency, unspecified: Secondary | ICD-10-CM

## 2020-02-18 DIAGNOSIS — E669 Obesity, unspecified: Secondary | ICD-10-CM | POA: Diagnosis not present

## 2020-02-19 NOTE — Progress Notes (Signed)
Chief Complaint:   OBESITY Shelley Ryan is here to discuss her progress with her obesity treatment plan along with follow-up of her obesity related diagnoses. Shelley Ryan is on the Category 2 Plan + 100 calories and states she is following her eating plan approximately 85% of the time. Shelley Ryan states she is walking 30 minutes 6 times per week.  Today's visit was #: 16 Starting weight: 242 lbs Starting date: 07/02/2019 Today's weight: 213 lbs Today's date: 02/18/2020 Total lbs lost to date: 29 Total lbs lost since last in-office visit: 0  Interim History: Shelley Ryan will get up and walk 10 minutes 3 times daily when working. She reports increased hunger at mid-afternoon and evening. She has been off topiramate 100 mg BID for several weeks. Her neurologist has recommended Pfizer COVID-190 vaccine booster. She recently learned that her maternal 1st degree cousin, had a similar vaccine reaction that she experienced to [employer] COVID-19 vaccine series. We recommended she share this information with her neurologist prior to [employer].  Subjective:   Prediabetes. Chaundra has a diagnosis of prediabetes based on her elevated HgA1c and was informed this puts her at greater risk of developing diabetes. She continues to work on diet and exercise to decrease her risk of diabetes. She denies nausea or hypoglycemia. 02/04/2020 blood glucose 84, A1c 5.9 with an insulin level of 10.9. Blood glucose and insulin levels are improved with worsening A1c level. Gracious is on metformin 500 mg BID with an increase in late afternoon/early evening hunger. She has been exercising more and has been off topiramate 100 mg BID for several weeks.  Lab Results  Component Value Date   HGBA1C 5.9 (H) 02/04/2020   Lab Results  Component Value Date   INSULIN 10.9 02/04/2020   INSULIN 18.2 10/23/2019   INSULIN 13.7 07/02/2019   Vitamin D deficiency. Last Vitamin D level 45.8, improved from last check but still  below goal of 50. Shelley Ryan is on Ergocalciferol.   Ref. Range 02/04/2020 10:00  Vitamin D, 25-Hydroxy Latest Ref Range: 30.0 - 100.0 ng/mL 45.8   Other hyperlipidemia, pure. Lipid panel on 02/04/2020 showed a slightly elevated LDL. Shelley Ryan denies cardiac symptoms.  Lab Results  Component Value Date   CHOL 190 02/04/2020   HDL 47 02/04/2020   LDLCALC 124 (H) 02/04/2020   TRIG 105 02/04/2020   CHOLHDL 4.0 02/04/2020   Lab Results  Component Value Date   ALT 20 02/04/2020   AST 17 02/04/2020   ALKPHOS 81 02/04/2020   BILITOT 0.3 02/04/2020   The ASCVD Risk score Denman George DC Jr., et al., 2013) failed to calculate for the following reasons:   The patient has a prior MI or stroke diagnosis  Assessment/Plan:   Prediabetes. Shelley Ryan will continue to work on weight loss, exercise, and decreasing simple carbohydrates to help decrease the risk of diabetes. She will add 100 calories of protein snacks daily.  Vitamin D deficiency. Low Vitamin D level contributes to fatigue and are associated with obesity, breast, and colon cancer. She agrees to continue to take prescription Ergocalciferol (no medication refill today) and will follow-up for routine testing of Vitamin D, at least 2-3 times per year to avoid over-replacement.  Other hyperlipidemia, pure. Cardiovascular risk and specific lipid/LDL goals reviewed.  We discussed several lifestyle modifications today and Shelley Ryan will continue to work on diet, exercise and weight loss efforts. Orders and follow up as documented in patient record. She will continue to decrease saturated fat intake and increase  regular walking.  Counseling Intensive lifestyle modifications are the first line treatment for this issue. . Dietary changes: Increase soluble fiber. Decrease simple carbohydrates. . Exercise changes: Moderate to vigorous-intensity aerobic activity 150 minutes per week if tolerated. . Lipid-lowering medications: see documented in medical record.  Class 1  obesity with serious comorbidity and body mass index (BMI) of 33.0 to 33.9 in adult, unspecified obesity type.  Shelley Ryan is currently in the action stage of change. As such, her goal is to continue with weight loss efforts. She has agreed to the Category 2 Plan + 100 calories.   Exercise goals: Shelley Ryan will continue walking 30 minutes 6 times per week.  Behavioral modification strategies: increasing lean protein intake, decreasing simple carbohydrates, meal planning and cooking strategies, better snacking choices and planning for success.  Shelley Ryan has agreed to follow-up with our clinic fasting in 2 weeks. She was informed of the importance of frequent follow-up visits to maximize her success with intensive lifestyle modifications for her multiple health conditions.   Objective:   Blood pressure 121/80, pulse 90, temperature 97.9 F (36.6 C), height 5\' 7"  (1.702 m), weight 213 lb (96.6 kg), SpO2 96 %. Body mass index is 33.36 kg/m.  General: Cooperative, alert, well developed, in no acute distress. HEENT: Conjunctivae and lids unremarkable. Cardiovascular: Regular rhythm.  Lungs: Normal work of breathing. Neurologic: No focal deficits.   Lab Results  Component Value Date   CREATININE 0.83 02/04/2020   BUN 20 02/04/2020   NA 140 02/04/2020   K 4.6 02/04/2020   CL 102 02/04/2020   CO2 23 02/04/2020   Lab Results  Component Value Date   ALT 20 02/04/2020   AST 17 02/04/2020   ALKPHOS 81 02/04/2020   BILITOT 0.3 02/04/2020   Lab Results  Component Value Date   HGBA1C 5.9 (H) 02/04/2020   HGBA1C 5.5 10/23/2019   HGBA1C 6.1 (H) 06/19/2019   Lab Results  Component Value Date   INSULIN 10.9 02/04/2020   INSULIN 18.2 10/23/2019   INSULIN 13.7 07/02/2019   Lab Results  Component Value Date   TSH 3.060 07/02/2019   Lab Results  Component Value Date   CHOL 190 02/04/2020   HDL 47 02/04/2020   LDLCALC 124 (H) 02/04/2020   TRIG 105 02/04/2020   CHOLHDL 4.0 02/04/2020   Lab  Results  Component Value Date   WBC 12.1 (H) 08/29/2019   HGB 13.6 08/29/2019   HCT 41.3 08/29/2019   MCV 91.0 08/29/2019   PLT 211 08/29/2019   No results found for: IRON, TIBC, FERRITIN  Attestation Statements:   Reviewed by clinician on day of visit: allergies, medications, problem list, medical history, surgical history, family history, social history, and previous encounter notes.  Time spent on visit including pre-visit chart review and post-visit charting and care was 29 minutes.   I, 10/29/2019, am acting as Shelley Ryan for Energy manager, NP-C   I have reviewed the above documentation for accuracy and completeness, and I agree with the above. -  Shelley Ryan d. Shelley Ryan. NP-C

## 2020-03-04 ENCOUNTER — Other Ambulatory Visit: Payer: Self-pay

## 2020-03-04 ENCOUNTER — Encounter (INDEPENDENT_AMBULATORY_CARE_PROVIDER_SITE_OTHER): Payer: Self-pay | Admitting: Adult Health

## 2020-03-04 ENCOUNTER — Ambulatory Visit (INDEPENDENT_AMBULATORY_CARE_PROVIDER_SITE_OTHER): Payer: BC Managed Care – PPO | Admitting: Adult Health

## 2020-03-04 VITALS — BP 119/71 | HR 90 | Temp 98.4°F | Ht 67.0 in | Wt 210.0 lb

## 2020-03-04 DIAGNOSIS — E669 Obesity, unspecified: Secondary | ICD-10-CM

## 2020-03-04 DIAGNOSIS — R7303 Prediabetes: Secondary | ICD-10-CM | POA: Diagnosis not present

## 2020-03-04 DIAGNOSIS — R0602 Shortness of breath: Secondary | ICD-10-CM

## 2020-03-04 DIAGNOSIS — Z9189 Other specified personal risk factors, not elsewhere classified: Secondary | ICD-10-CM

## 2020-03-04 DIAGNOSIS — G4733 Obstructive sleep apnea (adult) (pediatric): Secondary | ICD-10-CM | POA: Diagnosis not present

## 2020-03-04 DIAGNOSIS — Z6832 Body mass index (BMI) 32.0-32.9, adult: Secondary | ICD-10-CM

## 2020-03-04 DIAGNOSIS — E559 Vitamin D deficiency, unspecified: Secondary | ICD-10-CM | POA: Diagnosis not present

## 2020-03-04 NOTE — Progress Notes (Signed)
Chief Complaint:   OBESITY Shelley Ryan is here to discuss her progress with her obesity treatment plan along with follow-up of her obesity related diagnoses. [name] is on the Category 2 Plan + 100  calories and states she is following her eating plan approximately 90% of the time. Shelley Ryan states she is walking 30 minutes 5 times per week.  Today's visit was #: 17 Starting weight: 242 lbs Starting date: 07/02/2019 Today's weight: 210 lbs Today's date: 03/04/2020 Total lbs lost to date: 32 Total lbs lost since last in-office visit: 3  Interim History: Shelley Ryan has been treating polyphagia with high protein snacks - fantastic! IC was updated today and labs are current from October.  Of note, her neurologist suggested to receive Pfizer COVID-19 booster only if her job status requires the booster. She will be placed on steroids prior to the MRNA vaccine.  Subjective:   SOB (shortness of breath). [name] reports mild shortness of breath with extreme exertion. She denies chest pain with exertion. IC showed RMR of 1447 on 07/02/2019; IC RMR today 1610 with an improvement of 163.  Prediabetes. Shelley Ryan has a diagnosis of prediabetes based on her elevated HgA1c and was informed this puts her at greater risk of developing diabetes. She continues to work on diet and exercise to decrease her risk of diabetes. She denies nausea or hypoglycemia. 02/04/2020 A1c increased to 5.9 from 5.5 on 10/23/2019, however, insulin level is improved. Shelley Ryan is on metformin 500 mg BID, which she is tolerating well. She now denies polyphagia with addition of high protein snacks.  Lab Results  Component Value Date   HGBA1C 5.9 (H) 02/04/2020   Lab Results  Component Value Date   INSULIN 10.9 02/04/2020   INSULIN 18.2 10/23/2019   INSULIN 13.7 07/02/2019   Vitamin D deficiency. Vitamin D level on 02/04/2020 was 45.8, just below goal of 50. Shelley Ryan is on Ergocalciferol. No nausea, vomiting, or muscle weakness.     Ref. Range 02/04/2020 10:00  Vitamin D, 25-Hydroxy Latest Ref Range: 30.0 - 100.0 ng/mL 45.8   OSA (obstructive sleep apnea). Shelley Ryan's first sleep study was conducted when she weighed 259 lbs. Due to significant weight loss, new sleep study was completed on 02/05/2020 with stable results. She has been off CPAP since 01/07/2020 and denies increased fatigue.  At risk for diabetes mellitus. Shelley Ryan is at higher than average risk for developing diabetes due to prediabetes with elevated A1c and obesity.   Assessment/Plan:   SOB (shortness of breath). Annaleigha's shortness of breath appears to be obesity related and exercise induced. She has agreed to work on weight loss and gradually increase exercise to treat her exercise induced shortness of breath. Will continue to monitor closely. IC performed today.  Prediabetes. [name] will continue to work on weight loss, exercise, and decreasing simple carbohydrates to help decrease the risk of diabetes. She will continue metformin as directed and regular walking.  Vitamin D deficiency. Low Vitamin D level contributes to fatigue and are associated with obesity, breast, and colon cancer. She agrees to continue to take prescription Ergocalciferol as directed (no medication refill today) and will follow-up for routine testing of Vitamin D, at least 2-3 times per year to avoid over-replacement.  OSA (obstructive sleep apnea). Intensive lifestyle modifications are the first line treatment for this issue. We discussed several lifestyle modifications today and she will continue to work on diet, exercise and weight loss efforts. We will continue to monitor. Orders and follow up as documented  in patient record. Shelley Ryan will continue to follow the Category 2 meal plan and will continue regular exercise.  At risk for diabetes mellitus. Shelley Ryan was given approximately 15 minutes of diabetes education and counseling today. We discussed intensive lifestyle modifications today with an  emphasis on weight loss as well as increasing exercise and decreasing simple carbohydrates in her diet. We also reviewed medication options with an emphasis on risk versus benefit of those discussed.   Repetitive spaced learning was employed today to elicit superior memory formation and behavioral change.  Class 1 obesity with serious comorbidity and body mass index (BMI) of 32.0 to 32.9 in adult, unspecified obesity type.  Shelley Ryan is currently in the action stage of change. As such, her goal is to continue with weight loss efforts. She has agreed to the Category 2 Plan + 100 calories.  Handout was provided on [employer].   Exercise goals: Shelley Ryan will continue walking 30 minutes 5 times per week.  Behavioral modification strategies: increasing lean protein intake, meal planning and cooking strategies and planning for success.  Shelley Ryan has agreed to follow-up with our clinic in 2 weeks. She was informed of the importance of frequent follow-up visits to maximize her success with intensive lifestyle modifications for her multiple health conditions.   Objective:   Blood pressure 119/71, pulse 90, temperature 98.4 F (36.9 C), height 5\' 7"  (1.702 m), weight 210 lb (95.3 kg), SpO2 100 %. Body mass index is 32.89 kg/m.  General: Cooperative, alert, well developed, in no acute distress. HEENT: Conjunctivae and lids unremarkable. Cardiovascular: Regular rhythm.  Lungs: Normal work of breathing. Neurologic: No focal deficits.   Lab Results  Component Value Date   CREATININE 0.83 02/04/2020   BUN 20 02/04/2020   NA 140 02/04/2020   K 4.6 02/04/2020   CL 102 02/04/2020   CO2 23 02/04/2020   Lab Results  Component Value Date   ALT 20 02/04/2020   AST 17 02/04/2020   ALKPHOS 81 02/04/2020   BILITOT 0.3 02/04/2020   Lab Results  Component Value Date   HGBA1C 5.9 (H) 02/04/2020   HGBA1C 5.5 10/23/2019   HGBA1C 6.1 (H) 06/19/2019   Lab Results  Component Value Date   INSULIN 10.9  02/04/2020   INSULIN 18.2 10/23/2019   INSULIN 13.7 07/02/2019   Lab Results  Component Value Date   TSH 3.060 07/02/2019   Lab Results  Component Value Date   CHOL 190 02/04/2020   HDL 47 02/04/2020   LDLCALC 124 (H) 02/04/2020   TRIG 105 02/04/2020   CHOLHDL 4.0 02/04/2020   Lab Results  Component Value Date   WBC 12.1 (H) 08/29/2019   HGB 13.6 08/29/2019   HCT 41.3 08/29/2019   MCV 91.0 08/29/2019   PLT 211 08/29/2019   No results found for: IRON, TIBC, FERRITIN  Attestation Statements:   Reviewed by clinician on day of visit: allergies, medications, problem list, medical history, surgical history, family history, social history, and previous encounter notes.  I, 10/29/2019, am acting as Marianna Payment for Energy manager, NP-C   I have reviewed the above documentation for accuracy and completeness, and I agree with the above. -  Azariyah Luhrs d. Nadelyn Enriques, NP-C

## 2020-03-07 ENCOUNTER — Other Ambulatory Visit (INDEPENDENT_AMBULATORY_CARE_PROVIDER_SITE_OTHER): Payer: Self-pay | Admitting: Adult Health

## 2020-03-07 DIAGNOSIS — E559 Vitamin D deficiency, unspecified: Secondary | ICD-10-CM

## 2020-03-09 ENCOUNTER — Other Ambulatory Visit (INDEPENDENT_AMBULATORY_CARE_PROVIDER_SITE_OTHER): Payer: Self-pay | Admitting: Adult Health

## 2020-03-09 DIAGNOSIS — I10 Essential (primary) hypertension: Secondary | ICD-10-CM

## 2020-03-18 ENCOUNTER — Other Ambulatory Visit: Payer: Self-pay

## 2020-03-18 ENCOUNTER — Encounter (INDEPENDENT_AMBULATORY_CARE_PROVIDER_SITE_OTHER): Payer: Self-pay | Admitting: Family Medicine

## 2020-03-18 ENCOUNTER — Ambulatory Visit (INDEPENDENT_AMBULATORY_CARE_PROVIDER_SITE_OTHER): Payer: BC Managed Care – PPO | Admitting: Family Medicine

## 2020-03-18 VITALS — BP 120/78 | HR 100 | Temp 98.2°F | Ht 67.0 in | Wt 214.0 lb

## 2020-03-18 DIAGNOSIS — R7303 Prediabetes: Secondary | ICD-10-CM

## 2020-03-18 DIAGNOSIS — G4733 Obstructive sleep apnea (adult) (pediatric): Secondary | ICD-10-CM | POA: Diagnosis not present

## 2020-03-18 DIAGNOSIS — E669 Obesity, unspecified: Secondary | ICD-10-CM

## 2020-03-18 DIAGNOSIS — Z6833 Body mass index (BMI) 33.0-33.9, adult: Secondary | ICD-10-CM

## 2020-03-19 NOTE — Progress Notes (Signed)
Chief Complaint:   OBESITY Shelley Ryan is here to discuss her progress with her obesity treatment plan along with follow-up of her obesity related diagnoses. Shelley Ryan is on the Category 2 Plan + 100 calories and states she is following her eating plan approximately 90% of the time. Shelley Ryan states she is walking for 30 minutes 5 times per week.  Today's visit was #: 18 Starting weight: 242 lbs Starting date: 07/02/2019 Today's weight: 214 lbs Today's date: 03/18/2020 Total lbs lost to date: 28 Total lbs lost since last in-office visit: 0  Interim History: Shelley Ryan is up 4 lbs today. She is premenstrual. She is surpised at weight gain because she has adhered to the plan very well.  She notes her hunger is satisfied. She has increased her exercise recently. She reports she had been quite sedentary previously.   Subjective:   1. Pre-diabetes Shelley Ryan is on metformin BID, and she is tolerating this well. Last A1c was 5.9.   Lab Results  Component Value Date   HGBA1C 5.9 (H) 02/04/2020   Lab Results  Component Value Date   INSULIN 10.9 02/04/2020   INSULIN 18.2 10/23/2019   INSULIN 13.7 07/02/2019   2. OSA (obstructive sleep apnea) Shelley Ryan no longer needs CPAP per her recent sleep study since she has lost weight.  Assessment/Plan:   1. Pre-diabetes Shelley Ryan will continue metformin.  2. OSA (obstructive sleep apnea)  Shelley Ryan will continue to work on diet, exercise and weight loss efforts.   3. Class 1 obesity with serious comorbidity and body mass index (BMI) of 33.0 to 33.9 in adult, unspecified obesity type Shelley Ryan is currently in the action stage of change. As such, her goal is to continue with weight loss efforts. She has agreed to the Category 2 Plan + 100 calories and keeping a food journal and adhering to recommended goals of 400-500 calories and 35 grams of protein at supper daily.   We discussed adding resistance training 2 times per week to maintain her muscle mass a she loses weight.  Discussed importance of  adequate protein.   Exercise goals: Continue current regimen. She will consider adding resistance 2 times per week.  Behavioral modification strategies: planning for success.  Shelley Ryan has agreed to follow-up with our clinic in 2 weeks with William Hamburger, NP.   Objective:   Blood pressure 120/78, pulse 100, temperature 98.2 F (36.8 C), height 5\' 7"  (1.702 m), weight 214 lb (97.1 kg), SpO2 100 %. Body mass index is 33.52 kg/m.  General: Cooperative, alert, well developed, in no acute distress. HEENT: Conjunctivae and lids unremarkable. Cardiovascular: Regular rhythm.  Lungs: Normal work of breathing. Neurologic: No focal deficits.   Lab Results  Component Value Date   CREATININE 0.83 02/04/2020   BUN 20 02/04/2020   NA 140 02/04/2020   K 4.6 02/04/2020   CL 102 02/04/2020   CO2 23 02/04/2020   Lab Results  Component Value Date   ALT 20 02/04/2020   AST 17 02/04/2020   ALKPHOS 81 02/04/2020   BILITOT 0.3 02/04/2020   Lab Results  Component Value Date   HGBA1C 5.9 (H) 02/04/2020   HGBA1C 5.5 10/23/2019   HGBA1C 6.1 (H) 06/19/2019   Lab Results  Component Value Date   INSULIN 10.9 02/04/2020   INSULIN 18.2 10/23/2019   INSULIN 13.7 07/02/2019   Lab Results  Component Value Date   TSH 3.060 07/02/2019   Lab Results  Component Value Date   CHOL 190 02/04/2020  HDL 47 02/04/2020   LDLCALC 124 (H) 02/04/2020   TRIG 105 02/04/2020   CHOLHDL 4.0 02/04/2020   Lab Results  Component Value Date   WBC 12.1 (H) 08/29/2019   HGB 13.6 08/29/2019   HCT 41.3 08/29/2019   MCV 91.0 08/29/2019   PLT 211 08/29/2019   No results found for: IRON, TIBC, FERRITIN  Attestation Statements:   Reviewed by clinician on day of visit: allergies, medications, problem list, medical history, surgical history, family history, social history, and previous encounter notes.   Trude Mcburney, am acting as Energy manager for [place], FNP-C.  I have  reviewed the above documentation for accuracy and completeness, and I agree with the above. -  Jesse Sans, FNP'

## 2020-03-20 ENCOUNTER — Encounter (INDEPENDENT_AMBULATORY_CARE_PROVIDER_SITE_OTHER): Payer: Self-pay | Admitting: Family Medicine

## 2020-04-07 ENCOUNTER — Other Ambulatory Visit (INDEPENDENT_AMBULATORY_CARE_PROVIDER_SITE_OTHER): Payer: Self-pay | Admitting: Adult Health

## 2020-04-07 ENCOUNTER — Ambulatory Visit (INDEPENDENT_AMBULATORY_CARE_PROVIDER_SITE_OTHER): Payer: BC Managed Care – PPO | Admitting: Adult Health

## 2020-04-07 DIAGNOSIS — E559 Vitamin D deficiency, unspecified: Secondary | ICD-10-CM

## 2020-04-08 ENCOUNTER — Other Ambulatory Visit: Payer: Self-pay

## 2020-04-08 ENCOUNTER — Ambulatory Visit (INDEPENDENT_AMBULATORY_CARE_PROVIDER_SITE_OTHER): Payer: BC Managed Care – PPO | Admitting: Adult Health

## 2020-04-08 ENCOUNTER — Encounter (INDEPENDENT_AMBULATORY_CARE_PROVIDER_SITE_OTHER): Payer: Self-pay | Admitting: Adult Health

## 2020-04-08 VITALS — BP 129/83 | HR 90 | Temp 98.0°F | Ht 67.0 in | Wt 214.0 lb

## 2020-04-08 DIAGNOSIS — Z6833 Body mass index (BMI) 33.0-33.9, adult: Secondary | ICD-10-CM | POA: Diagnosis not present

## 2020-04-08 DIAGNOSIS — Z9189 Other specified personal risk factors, not elsewhere classified: Secondary | ICD-10-CM

## 2020-04-08 DIAGNOSIS — E669 Obesity, unspecified: Secondary | ICD-10-CM | POA: Diagnosis not present

## 2020-04-08 DIAGNOSIS — E559 Vitamin D deficiency, unspecified: Secondary | ICD-10-CM | POA: Diagnosis not present

## 2020-04-08 DIAGNOSIS — I10 Essential (primary) hypertension: Secondary | ICD-10-CM | POA: Diagnosis not present

## 2020-04-08 MED ORDER — LOSARTAN POTASSIUM 25 MG PO TABS: ORAL_TABLET | ORAL | 0 refills | Status: DC

## 2020-04-08 MED ORDER — VITAMIN D (ERGOCALCIFEROL) 1.25 MG (50000 UNIT) PO CAPS: ORAL_CAPSULE | ORAL | 0 refills | Status: DC

## 2020-04-08 NOTE — Progress Notes (Signed)
Chief Complaint:   OBESITY Shelley Ryan is here to discuss her progress with her obesity treatment plan along with follow-up of her obesity related diagnoses. Shelley Ryan is on the Category 2 Plan and journaling supper and states she is following her eating plan approximately 50% of the time. Shelley Ryan states she is walking 30 minutes 5 times per week.  Today's visit was #: 19 Starting weight: 242 lbs Starting date: 07/02/2019 Today's weight: 214 lbs Today's date: 04/08/2020 Total lbs lost to date: 28 Total lbs lost since last in-office visit: 0  Interim History: Shelley Ryan has attended several holiday parties and celebrated Thanksgiving. She has focused on protein, vegetables, and fruit and if she enjoys a sweet treat it is a very small portion. She will be traveling to [facility] this Friday and will be there for 7 days. She has meals well planned out for the trip and anticipates walking 8-10 miles [place].  Subjective:   Vitamin D deficiency. Vitamin D level on 02/04/2020 was 45.8, below goal of 50. Shelley Ryan is on Ergocalciferol. No nausea, vomiting, or muscle weakness.    Ref. Range 02/04/2020 10:00  Vitamin D, 25-Hydroxy Latest Ref Range: 30.0 - 100.0 ng/mL 45.8   Essential hypertension. Blood pressure and heart rate are stable on today's office visit. Shelley Ryan is on losartan 25 mg daily.  BP Readings from Last 3 Encounters:  04/08/20 129/83  03/18/20 120/78  03/04/20 119/71   Lab Results  Component Value Date   CREATININE 0.83 02/04/2020   CREATININE 0.86 10/23/2019   CREATININE 0.81 08/29/2019   At risk for heart disease. Shelley Ryan is at a higher than average risk for cardiovascular disease due to hypertension and obesity.   Assessment/Plan:   Vitamin D deficiency. Low Vitamin D level contributes to fatigue and are associated with obesity, breast, and colon cancer. She was given a refill on her Vitamin D, Ergocalciferol, (DRISDOL) 1.25 MG (50000 UNIT) CAPS  capsule every week #4 with 0 refills and will follow-up for routine testing of Vitamin D, at least 2-3 times per year to avoid over-replacement.   Essential hypertension. Shelley Ryan is working on healthy weight loss and exercise to improve blood pressure control. We will watch for signs of hypotension as she continues her lifestyle modifications. Refill was given for losartan (COZAAR) 25 MG tablet daily #30 with 0 refills.  At risk for heart disease. Shelley Ryan was given approximately 15 minutes of coronary artery disease prevention counseling today. She is 44 y.o. female and has risk factors for heart disease including obesity. We discussed intensive lifestyle modifications today with an emphasis on specific weight loss instructions and strategies.   Repetitive spaced learning was employed today to elicit superior memory formation and behavioral change.  Class 1 obesity with serious comorbidity and body mass index (BMI) of 33.0 to 33.9 in adult, unspecified obesity type.  Shelley Ryan is currently in the action stage of change. As such, her goal is to continue with weight loss efforts. She has agreed to the Category 2 Plan and will journal 400-500 calories and 35 grams of protein at supper.   Exercise goals: Shelley Ryan will continue walking 30 minutes 5 times per week.  Behavioral modification strategies: increasing lean protein intake, decreasing simple carbohydrates, meal planning and cooking strategies and planning for success.  Shelley Ryan has agreed to follow-up with our clinic in 3 weeks. She was informed of the importance of frequent follow-up visits to maximize her success with intensive lifestyle modifications  for her multiple health conditions.   Objective:   Blood pressure 129/83, pulse 90, temperature 98 F (36.7 C), height 5\' 7"  (1.702 m), weight 214 lb (97.1 kg), SpO2 97 %. Body mass index is 33.52 kg/m.  General: Cooperative, alert, well developed, in no acute distress. HEENT: Conjunctivae and lids  unremarkable. Cardiovascular: Regular rhythm.  Lungs: Normal work of breathing. Neurologic: No focal deficits.   Lab Results  Component Value Date   CREATININE 0.83 02/04/2020   BUN 20 02/04/2020   NA 140 02/04/2020   K 4.6 02/04/2020   CL 102 02/04/2020   CO2 23 02/04/2020   Lab Results  Component Value Date   ALT 20 02/04/2020   AST 17 02/04/2020   ALKPHOS 81 02/04/2020   BILITOT 0.3 02/04/2020   Lab Results  Component Value Date   HGBA1C 5.9 (H) 02/04/2020   HGBA1C 5.5 10/23/2019   HGBA1C 6.1 (H) 06/19/2019   Lab Results  Component Value Date   INSULIN 10.9 02/04/2020   INSULIN 18.2 10/23/2019   INSULIN 13.7 07/02/2019   Lab Results  Component Value Date   TSH 3.060 07/02/2019   Lab Results  Component Value Date   CHOL 190 02/04/2020   HDL 47 02/04/2020   LDLCALC 124 (H) 02/04/2020   TRIG 105 02/04/2020   CHOLHDL 4.0 02/04/2020   Lab Results  Component Value Date   WBC 12.1 (H) 08/29/2019   HGB 13.6 08/29/2019   HCT 41.3 08/29/2019   MCV 91.0 08/29/2019   PLT 211 08/29/2019   No results found for: IRON, TIBC, FERRITIN  Attestation Statements:   Reviewed by clinician on day of visit: allergies, medications, problem list, medical history, surgical history, family history, social history, and previous encounter notes.  I, 10/29/2019, am acting as Shelley Ryan for Energy manager, NP-C   I have reviewed the above documentation for accuracy and completeness, and I agree with the above. -  Shelley Ken d. Teosha Casso, NP-C

## 2020-04-09 ENCOUNTER — Other Ambulatory Visit (INDEPENDENT_AMBULATORY_CARE_PROVIDER_SITE_OTHER): Payer: Self-pay | Admitting: Adult Health

## 2020-04-09 DIAGNOSIS — I10 Essential (primary) hypertension: Secondary | ICD-10-CM

## 2020-04-30 ENCOUNTER — Ambulatory Visit (INDEPENDENT_AMBULATORY_CARE_PROVIDER_SITE_OTHER): Payer: BC Managed Care – PPO | Admitting: Adult Health

## 2020-04-30 ENCOUNTER — Encounter (INDEPENDENT_AMBULATORY_CARE_PROVIDER_SITE_OTHER): Payer: Self-pay | Admitting: Adult Health

## 2020-04-30 ENCOUNTER — Other Ambulatory Visit: Payer: Self-pay

## 2020-04-30 VITALS — BP 129/79 | HR 76 | Temp 98.1°F | Ht 67.0 in | Wt 215.0 lb

## 2020-04-30 DIAGNOSIS — E559 Vitamin D deficiency, unspecified: Secondary | ICD-10-CM | POA: Diagnosis not present

## 2020-04-30 DIAGNOSIS — Z6833 Body mass index (BMI) 33.0-33.9, adult: Secondary | ICD-10-CM | POA: Diagnosis not present

## 2020-04-30 DIAGNOSIS — R7303 Prediabetes: Secondary | ICD-10-CM

## 2020-04-30 DIAGNOSIS — E669 Obesity, unspecified: Secondary | ICD-10-CM

## 2020-05-04 NOTE — Progress Notes (Signed)
Chief Complaint:   OBESITY Shelley Ryan is here to discuss her progress with her obesity treatment plan along with follow-up of her obesity related diagnoses. Shelley Ryan is on the Category 2 Plan and keeping a food journal and adhering to recommended goals of 400-500 calories and 35 g protein and states she is following her eating plan approximately 70% of the time. Shelley Ryan states she is walking 30 minutes 5 times per week.  Today's visit was #: 20 Starting weight: 242 lbs Starting date: 07/02/2019 Today's weight: 215 lbs Today's date: 04/30/2020 Total lbs lost to date: 27 lbs Total lbs lost since last in-office visit: 0 lbs  Interim History: Shelley Ryan has not utilized journaling for dinner but hopes to do so in the [employer]. She traveled to [place] over the holidays and walked >25k steps a day while visiting various parks. She had purchased healthy snacks for the office and notes this has helped her with staying within 200 snack calories a day.  Subjective:   1. Vitamin D deficiency Shelley Ryan's Vitamin D level was 45.8 on 02/04/2020, which is below goal of 50. She is currently taking prescription vitamin D 50,000 IU each week. She denies nausea, vomiting or muscle weakness.  Ref. Range 02/04/2020 10:00  Vitamin D, 25-Hydroxy Latest Ref Range: 30.0 - 100.0 ng/mL 45.8   2. Pre-diabetes 02/04/2020 blood glucose 84, A1c 5.9 (elevated form 5.5 on 10/23/2019), and insulin level 10.9 (decrease from 18.2 on 10/23/2019). Shelley Ryan is prescribed Metformin 500 mg BID and is tolerating it well.  Lab Results  Component Value Date   HGBA1C 5.9 (H) 02/04/2020   Lab Results  Component Value Date   INSULIN 10.9 02/04/2020   INSULIN 18.2 10/23/2019   INSULIN 13.7 07/02/2019    Assessment/Plan:   1. Vitamin D deficiency Low Vitamin D level contributes to fatigue and are associated with obesity, breast, and colon cancer. She agrees to continue to take prescription Vitamin D @50 ,000 IU every week and will follow-up for  routine testing of Vitamin D, at least 2-3 times per year to avoid over-replacement.  2. Pre-diabetes Shelley Ryan will continue to work on weight loss, exercise, and decreasing simple carbohydrates to help decrease the risk of diabetes. Continue Metformin and category 2 meal plan.  3. Class 1 obesity with serious comorbidity and body mass index (BMI) of 33.0 to 33.9 in adult, unspecified obesity type Shelley Ryan is currently in the action stage of change. As such, her goal is to continue with weight loss efforts. She has agreed to the Category 2 Plan and keeping a food journal and adhering to recommended goals of 400-500 calories and 35 g protein with supper.   Exercise goals: As is  Behavioral modification strategies: increasing lean protein intake, meal planning and cooking strategies and planning for success.  Shelley Ryan has agreed to follow-up with our clinic in 2 weeks fasting. She was informed of the importance of frequent follow-up visits to maximize her success with intensive lifestyle modifications for her multiple health conditions.   Objective:   Blood pressure 129/79, pulse 76, temperature 98.1 F (36.7 C), height 5\' 7"  (1.702 m), weight 215 lb (97.5 kg), SpO2 98 %. Body mass index is 33.67 kg/m.  General: Cooperative, alert, well developed, in no acute distress. HEENT: Conjunctivae and lids unremarkable. Cardiovascular: Regular rhythm.  Lungs: Normal work of breathing. Neurologic: No focal deficits.   Lab Results  Component Value Date   CREATININE 0.83 02/04/2020   BUN 20 02/04/2020   NA 140 02/04/2020  K 4.6 02/04/2020   CL 102 02/04/2020   CO2 23 02/04/2020   Lab Results  Component Value Date   ALT 20 02/04/2020   AST 17 02/04/2020   ALKPHOS 81 02/04/2020   BILITOT 0.3 02/04/2020   Lab Results  Component Value Date   HGBA1C 5.9 (H) 02/04/2020   HGBA1C 5.5 10/23/2019   HGBA1C 6.1 (H) 06/19/2019   Lab Results  Component Value Date   INSULIN 10.9 02/04/2020   INSULIN  18.2 10/23/2019   INSULIN 13.7 07/02/2019   Lab Results  Component Value Date   TSH 3.060 07/02/2019   Lab Results  Component Value Date   CHOL 190 02/04/2020   HDL 47 02/04/2020   LDLCALC 124 (H) 02/04/2020   TRIG 105 02/04/2020   CHOLHDL 4.0 02/04/2020   Lab Results  Component Value Date   WBC 12.1 (H) 08/29/2019   HGB 13.6 08/29/2019   HCT 41.3 08/29/2019   MCV 91.0 08/29/2019   PLT 211 08/29/2019   No results found for: IRON, TIBC, FERRITIN Attestation Statements:   Reviewed by clinician on day of visit: allergies, medications, problem list, medical history, surgical history, family history, social history, and previous encounter notes.  Time spent on visit including pre-visit chart review and post-visit care and charting was 28 minutes.   Edmund Hilda, am acting as Energy manager for William Hamburger, NP.  I have reviewed the above documentation for accuracy and completeness, and I agree with the above. -  Daphnie Venturini d. Angline Schweigert, NP-C

## 2020-05-06 ENCOUNTER — Other Ambulatory Visit (INDEPENDENT_AMBULATORY_CARE_PROVIDER_SITE_OTHER): Payer: Self-pay | Admitting: Adult Health

## 2020-05-06 DIAGNOSIS — E559 Vitamin D deficiency, unspecified: Secondary | ICD-10-CM

## 2020-05-13 ENCOUNTER — Encounter (INDEPENDENT_AMBULATORY_CARE_PROVIDER_SITE_OTHER): Payer: Self-pay | Admitting: Adult Health

## 2020-05-13 ENCOUNTER — Other Ambulatory Visit: Payer: Self-pay

## 2020-05-13 ENCOUNTER — Telehealth (INDEPENDENT_AMBULATORY_CARE_PROVIDER_SITE_OTHER): Payer: BC Managed Care – PPO | Admitting: Adult Health

## 2020-05-13 ENCOUNTER — Other Ambulatory Visit (INDEPENDENT_AMBULATORY_CARE_PROVIDER_SITE_OTHER): Payer: Self-pay | Admitting: Adult Health

## 2020-05-13 VITALS — BP 153/90 | HR 74 | Ht 67.0 in | Wt 215.0 lb

## 2020-05-13 DIAGNOSIS — E559 Vitamin D deficiency, unspecified: Secondary | ICD-10-CM

## 2020-05-13 DIAGNOSIS — I10 Essential (primary) hypertension: Secondary | ICD-10-CM

## 2020-05-13 DIAGNOSIS — E669 Obesity, unspecified: Secondary | ICD-10-CM | POA: Diagnosis not present

## 2020-05-13 DIAGNOSIS — Z6833 Body mass index (BMI) 33.0-33.9, adult: Secondary | ICD-10-CM | POA: Diagnosis not present

## 2020-05-13 DIAGNOSIS — J40 Bronchitis, not specified as acute or chronic: Secondary | ICD-10-CM | POA: Diagnosis not present

## 2020-05-13 MED ORDER — VITAMIN D (ERGOCALCIFEROL) 1.25 MG (50000 UNIT) PO CAPS: ORAL_CAPSULE | ORAL | 0 refills | Status: DC

## 2020-05-15 DIAGNOSIS — J40 Bronchitis, not specified as acute or chronic: Secondary | ICD-10-CM | POA: Insufficient documentation

## 2020-05-15 NOTE — Progress Notes (Signed)
TeleHealth Visit:  Due to the COVID-19 pandemic, this visit was completed with telemedicine (audio/video) technology to reduce patient and provider exposure as well as to preserve personal protective equipment.   Kimla has verbally consented to this TeleHealth visit. The patient is located at home, the provider is located at the [facility] office. The participants in this visit include the listed provider and patient. The visit was conducted today via video.  Chief Complaint: OBESITY Xianna is here to discuss her progress with her obesity treatment plan along with follow-up of her obesity related diagnoses. Cleta is on the Category 1 Plan and states she is following her eating plan approximately 80% of the time. Truly states she is exercising 0 minutes 0 times per week.  Today's visit was #: 21 Starting weight: 242 lbs Starting date: 07/02/2019  Interim History: Ajane is experiencing wheezing and congestion and has been diagnosed with bronchitis. She is on a course of prednisone and azithromycin and will complete them tomorrow. She reports increase in appetite with prednisone. Patient has not been exercising since acute illness. Her home scale weight is 215 lbs. Home scale weighs heavier than clinic scale.   Subjective:   1. Vitamin D deficiency Vanna's Vitamin D level was 45.8 on 02/04/2020. She is currently taking prescription vitamin D 50,000 IU each week. She denies nausea, vomiting or muscle weakness.  Ref. Range 02/04/2020 10:00  Vitamin D, 25-Hydroxy Latest Ref Range: 30.0 - 100.0 ng/mL 45.8   2. Bronchitis Malavika has acute symptoms of wheezing and congestion. She is on prednisone and azithromycin. Patient reports significant reduction in symptoms since being on the regimen.  She has experienced increase in appetite since starting oral steroid therapy.  Assessment/Plan:   1. Vitamin D deficiency Low Vitamin D level contributes to fatigue and are associated with  obesity, breast, and colon cancer. She agrees to continue to take prescription Vitamin D @50 ,000 IU every week and will follow-up for routine testing of Vitamin D, at least 2-3 times per year to avoid over-replacement. - Vitamin D, Ergocalciferol, (DRISDOL) 1.25 MG (50000 UNIT) CAPS capsule; TAKE 1 CAPSULE BY MOUTH EVERY 7 DAYS  Dispense: 4 capsule; Refill: 0  2. Bronchitis Complete steroid and antibiotic as directed.  3. Class 1 obesity with serious comorbidity and body mass index (BMI) of 33.0 to 33.9 in adult, unspecified obesity type Summer is currently in the action stage of change. As such, her goal is to continue with weight loss efforts. She has agreed to the Category 1 Plan.   Exercise goals: No exercise has been prescribed at this time.  Behavioral modification strategies: increasing lean protein intake, meal planning and cooking strategies and planning for success.  Lilas has agreed to follow-up with our clinic in 2 weeks. She was informed of the importance of frequent follow-up visits to maximize her success with intensive lifestyle modifications for her multiple health conditions.  Objective:   VITALS: Per patient if applicable, see vitals. GENERAL: Alert and in no acute distress. CARDIOPULMONARY: No increased WOB. Speaking in clear sentences.  PSYCH: Pleasant and cooperative. Speech normal rate and rhythm. Affect is appropriate. Insight and judgement are appropriate. Attention is focused, linear, and appropriate.  NEURO: Oriented as arrived to appointment on time with no prompting.   Lab Results  Component Value Date   CREATININE 0.83 02/04/2020   BUN 20 02/04/2020   NA 140 02/04/2020   K 4.6 02/04/2020   CL 102 02/04/2020   CO2 23 02/04/2020  Lab Results  Component Value Date   ALT 20 02/04/2020   AST 17 02/04/2020   ALKPHOS 81 02/04/2020   BILITOT 0.3 02/04/2020   Lab Results  Component Value Date   HGBA1C 5.9 (H) 02/04/2020   HGBA1C 5.5 10/23/2019   HGBA1C  6.1 (H) 06/19/2019   Lab Results  Component Value Date   INSULIN 10.9 02/04/2020   INSULIN 18.2 10/23/2019   INSULIN 13.7 07/02/2019   Lab Results  Component Value Date   TSH 3.060 07/02/2019   Lab Results  Component Value Date   CHOL 190 02/04/2020   HDL 47 02/04/2020   LDLCALC 124 (H) 02/04/2020   TRIG 105 02/04/2020   CHOLHDL 4.0 02/04/2020   Lab Results  Component Value Date   WBC 12.1 (H) 08/29/2019   HGB 13.6 08/29/2019   HCT 41.3 08/29/2019   MCV 91.0 08/29/2019   PLT 211 08/29/2019   No results found for: IRON, TIBC, FERRITIN  Attestation Statements:   Reviewed by clinician on day of visit: allergies, medications, problem list, medical history, surgical history, family history, social history, and previous encounter notes.  Time spent on visit including pre-visit chart review and post-visit charting and care was 33 minutes.   Edmund Hilda, am acting as Energy manager for William Hamburger, NP.  I have reviewed the above documentation for accuracy and completeness, and I agree with the above. - Ishia Tenorio d. Alexie Samson, NP-C

## 2020-05-27 ENCOUNTER — Other Ambulatory Visit: Payer: Self-pay

## 2020-05-27 ENCOUNTER — Encounter (INDEPENDENT_AMBULATORY_CARE_PROVIDER_SITE_OTHER): Payer: Self-pay | Admitting: Adult Health

## 2020-05-27 ENCOUNTER — Ambulatory Visit (INDEPENDENT_AMBULATORY_CARE_PROVIDER_SITE_OTHER): Payer: BC Managed Care – PPO | Admitting: Adult Health

## 2020-05-27 VITALS — BP 121/78 | HR 94 | Temp 98.0°F | Ht 67.0 in | Wt 211.0 lb

## 2020-05-27 DIAGNOSIS — E559 Vitamin D deficiency, unspecified: Secondary | ICD-10-CM | POA: Diagnosis not present

## 2020-05-27 DIAGNOSIS — I10 Essential (primary) hypertension: Secondary | ICD-10-CM | POA: Diagnosis not present

## 2020-05-27 DIAGNOSIS — Z6833 Body mass index (BMI) 33.0-33.9, adult: Secondary | ICD-10-CM

## 2020-05-27 DIAGNOSIS — E669 Obesity, unspecified: Secondary | ICD-10-CM

## 2020-05-27 DIAGNOSIS — Z9189 Other specified personal risk factors, not elsewhere classified: Secondary | ICD-10-CM | POA: Diagnosis not present

## 2020-05-27 DIAGNOSIS — E7849 Other hyperlipidemia: Secondary | ICD-10-CM

## 2020-05-27 DIAGNOSIS — R7303 Prediabetes: Secondary | ICD-10-CM

## 2020-05-27 MED ORDER — VITAMIN D (ERGOCALCIFEROL) 1.25 MG (50000 UNIT) PO CAPS: ORAL_CAPSULE | ORAL | 0 refills | Status: DC

## 2020-05-27 MED ORDER — LOSARTAN POTASSIUM 25 MG PO TABS: ORAL_TABLET | ORAL | 0 refills | Status: DC

## 2020-05-28 LAB — INSULIN, RANDOM: INSULIN: 11.4 u[IU]/mL (ref 2.6–24.9)

## 2020-05-28 LAB — COMPREHENSIVE METABOLIC PANEL
ALT: 18 IU/L (ref 0–32)
AST: 21 IU/L (ref 0–40)
Albumin/Globulin Ratio: 1.5 (ref 1.2–2.2)
Albumin: 4.3 g/dL (ref 3.8–4.8)
Alkaline Phosphatase: 79 IU/L (ref 44–121)
BUN/Creatinine Ratio: 21 (ref 9–23)
BUN: 18 mg/dL (ref 6–24)
Bilirubin Total: 0.4 mg/dL (ref 0.0–1.2)
CO2: 19 mmol/L — ABNORMAL LOW (ref 20–29)
Calcium: 9.7 mg/dL (ref 8.7–10.2)
Chloride: 100 mmol/L (ref 96–106)
Creatinine, Ser: 0.84 mg/dL (ref 0.57–1.00)
GFR calc Af Amer: 96 mL/min/{1.73_m2} (ref >59)
GFR calc non Af Amer: 84 mL/min/{1.73_m2} (ref >59)
Globulin, Total: 2.8 g/dL (ref 1.5–4.5)
Glucose: 92 mg/dL (ref 65–99)
Potassium: 4.2 mmol/L (ref 3.5–5.2)
Sodium: 131 mmol/L — ABNORMAL LOW (ref 134–144)
Total Protein: 7.1 g/dL (ref 6.0–8.5)

## 2020-05-28 LAB — LIPID PANEL
Chol/HDL Ratio: 4.3 ratio (ref 0.0–4.4)
Cholesterol, Total: 217 mg/dL — ABNORMAL HIGH (ref 100–199)
HDL: 51 mg/dL (ref >39)
LDL Chol Calc (NIH): 144 mg/dL — ABNORMAL HIGH (ref 0–99)
Triglycerides: 122 mg/dL (ref 0–149)
VLDL Cholesterol Cal: 22 mg/dL (ref 5–40)

## 2020-05-28 LAB — VITAMIN D 25 HYDROXY (VIT D DEFICIENCY, FRACTURES): Vit D, 25-Hydroxy: 45.9 ng/mL (ref 30.0–100.0)

## 2020-05-28 LAB — HEMOGLOBIN A1C
Est. average glucose Bld gHb Est-mCnc: 120 mg/dL
Hgb A1c MFr Bld: 5.8 % — ABNORMAL HIGH (ref 4.8–5.6)

## 2020-05-28 NOTE — Progress Notes (Signed)
Chief Complaint:   OBESITY Shelley Ryan is here to discuss her progress with her obesity treatment plan along with follow-up of her obesity related diagnoses. Shelley Ryan is on the Category 1 Plan and the Category 2 Plan and states she is following her eating plan approximately 90% of the time. Shelley Ryan states she is walking 30 minutes 5 times per week.  Today's visit was #: 22 Starting weight: 242 lbs Starting date: 07/02/2019 Today's weight: 211 lbs Today's date: 05/27/2020 Total lbs lost to date: 31 lbs Total lbs lost since last in-office visit: 4 lbs  Interim History: Pt has been challenged to find microwave meals with minimum 20 g protein. Most meals hover around 17-18 g. She has been consuming 300 snack calories a day, which will prevent evening polyphagia. Interval goal: Be down another 2-3 lbs at next OV.  Subjective:   1. Essential hypertension BP and heart rate are excellent in OV today. Pt is on losartan 25 mg and tolerating it well.   BP Readings from Last 3 Encounters:  05/27/20 121/78  05/13/20 (!) 153/90  04/30/20 129/79    2. Vitamin D deficiency Shelley Ryan's Vitamin D level was 45.8, which is below goal of 50 on 02/04/2020. She is currently taking prescription vitamin D 50,000 IU each week. She denies nausea, vomiting or muscle weakness.   Ref. Range 02/04/2020 10:00  Vitamin D, 25-Hydroxy Latest Ref Range: 30.0 - 100.0 ng/mL 45.8   3. Pre-diabetes 02/04/2020 A1c 5.9 with elevated insulin level of 10.9. Worsening A1c but improving insulin. Pt is on Metformin 500 mg BID for PCOS treatment.  Lab Results  Component Value Date   HGBA1C 5.8 (H) 05/27/2020   Lab Results  Component Value Date   INSULIN WILL FOLLOW 05/27/2020   INSULIN 10.9 02/04/2020   INSULIN 18.2 10/23/2019   INSULIN 13.7 07/02/2019   4. Other hyperlipidemia 02/04/2020 LDL above goal at 124. Pt is not on statin therapy.  5. At risk for heart disease Shakenna is at a higher than average risk for  cardiovascular disease due to obesity and hyperlipidemia.   Assessment/Plan:   1. Essential hypertension Ladye is working on healthy weight loss and exercise to improve blood pressure control. We will watch for signs of hypotension as she continues her lifestyle modifications. Check labs today.  - losartan (COZAAR) 25 MG tablet; TAKE 1 TABLET BY MOUTH EVERY DAY  Dispense: 30 tablet; Refill: 0 - Comprehensive metabolic panel  2. Vitamin D deficiency Low Vitamin D level contributes to fatigue and are associated with obesity, breast, and colon cancer. She agrees to continue to take prescription Vitamin D @50 ,000 IU every week and will follow-up for routine testing of Vitamin D, at least 2-3 times per year to avoid over-replacement. Check labs today.  - Vitamin D, Ergocalciferol, (DRISDOL) 1.25 MG (50000 UNIT) CAPS capsule; TAKE 1 CAPSULE BY MOUTH EVERY 7 DAYS  Dispense: 4 capsule; Refill: 0 - VITAMIN D 25 Hydroxy (Vit-D Deficiency, Fractures)  3. Prediabetes Shelley Ryan will continue to work on weight loss, exercise, and decreasing simple carbohydrates to help decrease the risk of diabetes. Check labs today.  - Hemoglobin A1c - Insulin, random  4. Other hyperlipidemia Cardiovascular risk and specific lipid/LDL goals reviewed.  We discussed several lifestyle modifications today and Shelley Ryan will continue to work on diet, exercise and weight loss efforts. Orders and follow up as documented in patient record. Check labs today.  Counseling Intensive lifestyle modifications are the first line treatment for this issue. . Dietary  changes: Increase soluble fiber. Decrease simple carbohydrates. . Exercise changes: Moderate to vigorous-intensity aerobic activity 150 minutes per week if tolerated. . Lipid-lowering medications: see documented in medical record.  - Comprehensive metabolic panel - Lipid panel  5. At risk for heart disease Shelley Ryan was given approximately 15 minutes of coronary artery disease  prevention counseling today. She is 45 y.o. female and has risk factors for heart disease including obesity. We discussed intensive lifestyle modifications today with an emphasis on specific weight loss instructions and strategies.   Repetitive spaced learning was employed today to elicit superior memory formation and behavioral change.  6. Class 1 obesity with serious comorbidity and body mass index (BMI) of 33.0 to 33.9 in adult, unspecified obesity type Shelley Ryan is currently in the action stage of change. As such, her goal is to continue with weight loss efforts. She has agreed to the Category 2 + 100 calories (300 snack) Plan and keeping a food journal and adhering to recommended goals of 200-300 calories and 20 g protein at breakfast. Add more meat protein or light cheese protein on microwave meals that are under 20g protein.  Exercise goals: As is  Behavioral modification strategies: increasing lean protein intake, meal planning and cooking strategies and planning for success.   Keimani has agreed to follow-up with our clinic in 2 weeks. She was informed of the importance of frequent follow-up visits to maximize her success with intensive lifestyle modifications for her multiple health conditions.   Shelley Ryan was informed we would discuss her lab results at her next visit unless there is a critical issue that needs to be addressed sooner. Shelley Ryan agreed to keep her next visit at the agreed upon time to discuss these results.  Objective:   Blood pressure 121/78, pulse 94, temperature 98 F (36.7 C), height 5\' 7"  (1.702 m), weight 211 lb (95.7 kg), SpO2 99 %. Body mass index is 33.05 kg/m.  General: Cooperative, alert, well developed, in no acute distress. HEENT: Conjunctivae and lids unremarkable. Cardiovascular: Regular rhythm.  Lungs: Normal work of breathing. Neurologic: No focal deficits.   Lab Results  Component Value Date   CREATININE 0.84 05/27/2020   BUN 18 05/27/2020   NA 131 (L)  05/27/2020   K 4.2 05/27/2020   CL 100 05/27/2020   CO2 19 (L) 05/27/2020   Lab Results  Component Value Date   ALT 18 05/27/2020   AST 21 05/27/2020   ALKPHOS 79 05/27/2020   BILITOT 0.4 05/27/2020   Lab Results  Component Value Date   HGBA1C 5.8 (H) 05/27/2020   HGBA1C 5.9 (H) 02/04/2020   HGBA1C 5.5 10/23/2019   HGBA1C 6.1 (H) 06/19/2019   Lab Results  Component Value Date   INSULIN WILL FOLLOW 05/27/2020   INSULIN 10.9 02/04/2020   INSULIN 18.2 10/23/2019   INSULIN 13.7 07/02/2019   Lab Results  Component Value Date   TSH 3.060 07/02/2019   Lab Results  Component Value Date   CHOL 217 (H) 05/27/2020   HDL 51 05/27/2020   LDLCALC 144 (H) 05/27/2020   TRIG 122 05/27/2020   CHOLHDL 4.3 05/27/2020   Lab Results  Component Value Date   WBC 12.1 (H) 08/29/2019   HGB 13.6 08/29/2019   HCT 41.3 08/29/2019   MCV 91.0 08/29/2019   PLT 211 08/29/2019   No results found for: IRON, TIBC, FERRITIN  Attestation Statements:   Reviewed by clinician on day of visit: allergies, medications, problem list, medical history, surgical history, family history, social history,  and previous encounter notes.  Edmund Hilda, am acting as Energy manager for William Hamburger, NP.  I have reviewed the above documentation for accuracy and completeness, and I agree with the above. -  Harkirat Orozco d. Nusrat Encarnacion, NP-C

## 2020-06-09 ENCOUNTER — Other Ambulatory Visit: Payer: Self-pay

## 2020-06-09 ENCOUNTER — Encounter (INDEPENDENT_AMBULATORY_CARE_PROVIDER_SITE_OTHER): Payer: Self-pay | Admitting: Adult Health

## 2020-06-09 ENCOUNTER — Ambulatory Visit (INDEPENDENT_AMBULATORY_CARE_PROVIDER_SITE_OTHER): Payer: BC Managed Care – PPO | Admitting: Adult Health

## 2020-06-09 VITALS — BP 115/77 | HR 86 | Temp 97.9°F | Ht 67.0 in | Wt 212.0 lb

## 2020-06-09 DIAGNOSIS — Z9189 Other specified personal risk factors, not elsewhere classified: Secondary | ICD-10-CM

## 2020-06-09 DIAGNOSIS — I10 Essential (primary) hypertension: Secondary | ICD-10-CM | POA: Diagnosis not present

## 2020-06-09 DIAGNOSIS — E669 Obesity, unspecified: Secondary | ICD-10-CM | POA: Diagnosis not present

## 2020-06-09 DIAGNOSIS — E782 Mixed hyperlipidemia: Secondary | ICD-10-CM

## 2020-06-09 DIAGNOSIS — R7303 Prediabetes: Secondary | ICD-10-CM

## 2020-06-09 DIAGNOSIS — Z6833 Body mass index (BMI) 33.0-33.9, adult: Secondary | ICD-10-CM

## 2020-06-09 MED ORDER — ROSUVASTATIN CALCIUM 10 MG PO TABS: ORAL_TABLET | ORAL | 0 refills | Status: DC

## 2020-06-10 ENCOUNTER — Ambulatory Visit (INDEPENDENT_AMBULATORY_CARE_PROVIDER_SITE_OTHER): Payer: BC Managed Care – PPO | Admitting: Adult Health

## 2020-06-10 NOTE — Progress Notes (Signed)
Chief Complaint:   OBESITY Shelley Ryan is here to discuss her progress with her obesity treatment plan along with follow-up of her obesity related diagnoses. Shelley Ryan is on the Category 2 Plan + 100 calories and keeping a food journal and adhering to recommended goals of 200-300 calories and 20 g protein with breakfast and states she is following her eating plan approximately 85% of the time. Shelley Ryan states she is walking 30 minutes 2 times per week.  Today's visit was #: 23 Starting weight: 242 lbs Starting date: 07/02/2019 Today's weight: 212 lbs Today's date: 06/09/2020 Total lbs lost to date: 30 lbs Total lbs lost since last in-office visit: 0  Interim History: Shelley Ryan is now wearing a size 12 in jeans and feeling great! Her 44 year old daughter established with [facility] today.   Subjective:   1. Mixed hyperlipidemia Worsening. Discussed labs with patient today. 05/27/2020 Lipid panel resulted and increased total and LDL levels despite healthy eating. 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 Her father has HLD.    Lab Results  Component Value Date   ALT 18 05/27/2020   AST 21 05/27/2020   ALKPHOS 79 05/27/2020   BILITOT 0.4 05/27/2020   Lab Results  Component Value Date   CHOL 217 (H) 05/27/2020   HDL 51 05/27/2020   LDLCALC 144 (H) 05/27/2020   TRIG 122 05/27/2020   CHOLHDL 4.3 05/27/2020    2. Pre-diabetes Discussed labs with patient today. 05/27/2020 insulin and A1c have both improved! Pt is on Metformin 500 mg BID and tolerating it well.  Lab Results  Component Value Date   HGBA1C 5.8 (H) 05/27/2020   Lab Results  Component Value Date   INSULIN 11.4 05/27/2020   INSULIN 10.9 02/04/2020   INSULIN 18.2 10/23/2019   INSULIN 13.7 07/02/2019    3. Essential hypertension Pt has experienced occasional dizziness with position changes. She is on Losartan 25 mg daily. Her BP and heart rate today are 115/77 and 86.  BP Readings from Last 3  Encounters:  06/09/20 115/77  05/27/20 121/78  05/13/20 (!) 153/90    4. At risk for heart disease Shelley Ryan is at a higher than average risk for cardiovascular disease due to obesity, hyperlipidemia, and history of previous CV event.   Assessment/Plan:   1. Mixed hyperlipidemia Cardiovascular risk and specific lipid/LDL goals reviewed.  We discussed several lifestyle modifications today and Shelley Ryan will continue to work on diet, exercise and weight loss efforts. Risks/benefits of statin therapy were discussed-she is agreeable to once weekly rosuvastatin. Orders and follow up as documented in patient record. Recheck CMP in 6 weeks. Check lipid panel in 3 months. Start rosuvastatin 10 mg once a week.  Counseling Intensive lifestyle modifications are the first line treatment for this issue. . Dietary changes: Increase soluble fiber. Decrease simple carbohydrates. . Exercise changes: Moderate to vigorous-intensity aerobic activity 150 minutes per week if tolerated. . Lipid-lowering medications: see documented in medical record.  2. Pre-diabetes Shelley Ryan will continue to work on weight loss, exercise, and decreasing simple carbohydrates to help decrease the risk of diabetes. Continue Metformin 500 mg and category 2 meal plan.  3. Essential hypertension Shelley Ryan is working on healthy weight loss and exercise to improve blood pressure control. We will watch for signs of hypotension as she continues her lifestyle modifications. Remain well hydrated with water. Check BP and heart at home and bring log in to next OV.  4. At risk for  heart disease Shelley Ryan was given approximately 15 minutes of coronary artery disease prevention counseling today. She is 45 y.o. female and has risk factors for heart disease including obesity. We discussed intensive lifestyle modifications today with an emphasis on specific weight loss instructions and strategies.   Repetitive spaced learning was employed today to elicit superior  memory formation and behavioral change.  5. Class 1 obesity with serious comorbidity and body mass index (BMI) of 33.0 to 33.9 in adult, unspecified obesity type Shelley Ryan is currently in the action stage of change. As such, her goal is to continue with weight loss efforts. She has agreed to the Category 2 Plan and keeping a food journal and adhering to recommended goals of 200-300 calories and 20 g protein with breakfast.   Exercise goals: As is  Behavioral modification strategies: increasing lean protein intake, meal planning and cooking strategies, planning for success and keeping a strict food journal.  Shelley Ryan has agreed to follow-up with our clinic in 2 weeks. She was informed of the importance of frequent follow-up visits to maximize her success with intensive lifestyle modifications for her multiple health conditions.   Objective:   Blood pressure 115/77, pulse 86, temperature 97.9 F (36.6 C), height 5\' 7"  (1.702 m), weight 212 lb (96.2 kg), SpO2 100 %. Body mass index is 33.2 kg/m.  General: Cooperative, alert, well developed, in no acute distress. HEENT: Conjunctivae and lids unremarkable. Cardiovascular: Regular rhythm.  Lungs: Normal work of breathing. Neurologic: No focal deficits.   Lab Results  Component Value Date   CREATININE 0.84 05/27/2020   BUN 18 05/27/2020   NA 131 (L) 05/27/2020   K 4.2 05/27/2020   CL 100 05/27/2020   CO2 19 (L) 05/27/2020   Lab Results  Component Value Date   ALT 18 05/27/2020   AST 21 05/27/2020   ALKPHOS 79 05/27/2020   BILITOT 0.4 05/27/2020   Lab Results  Component Value Date   HGBA1C 5.8 (H) 05/27/2020   HGBA1C 5.9 (H) 02/04/2020   HGBA1C 5.5 10/23/2019   HGBA1C 6.1 (H) 06/19/2019   Lab Results  Component Value Date   INSULIN 11.4 05/27/2020   INSULIN 10.9 02/04/2020   INSULIN 18.2 10/23/2019   INSULIN 13.7 07/02/2019   Lab Results  Component Value Date   TSH 3.060 07/02/2019   Lab Results  Component Value Date    CHOL 217 (H) 05/27/2020   HDL 51 05/27/2020   LDLCALC 144 (H) 05/27/2020   TRIG 122 05/27/2020   CHOLHDL 4.3 05/27/2020   Lab Results  Component Value Date   WBC 12.1 (H) 08/29/2019   HGB 13.6 08/29/2019   HCT 41.3 08/29/2019   MCV 91.0 08/29/2019   PLT 211 08/29/2019    Attestation Statements:   Reviewed by clinician on day of visit: allergies, medications, problem list, medical history, surgical history, family history, social history, and previous encounter notes.  10/29/2019, am acting as Edmund Hilda for Energy manager, NP.  I have reviewed the above documentation for accuracy and completeness, and I agree with the above. -  Charnele Semple d. Mckensie Scotti, NP-C

## 2020-06-24 ENCOUNTER — Encounter (INDEPENDENT_AMBULATORY_CARE_PROVIDER_SITE_OTHER): Payer: Self-pay | Admitting: Adult Health

## 2020-06-24 ENCOUNTER — Other Ambulatory Visit: Payer: Self-pay

## 2020-06-24 ENCOUNTER — Ambulatory Visit (INDEPENDENT_AMBULATORY_CARE_PROVIDER_SITE_OTHER): Payer: BC Managed Care – PPO | Admitting: Adult Health

## 2020-06-24 VITALS — BP 108/73 | HR 98 | Temp 98.0°F | Ht 67.0 in | Wt 210.0 lb

## 2020-06-24 DIAGNOSIS — I10 Essential (primary) hypertension: Secondary | ICD-10-CM | POA: Diagnosis not present

## 2020-06-24 DIAGNOSIS — E782 Mixed hyperlipidemia: Secondary | ICD-10-CM

## 2020-06-24 DIAGNOSIS — E669 Obesity, unspecified: Secondary | ICD-10-CM | POA: Diagnosis not present

## 2020-06-24 DIAGNOSIS — E559 Vitamin D deficiency, unspecified: Secondary | ICD-10-CM | POA: Diagnosis not present

## 2020-06-24 DIAGNOSIS — Z9189 Other specified personal risk factors, not elsewhere classified: Secondary | ICD-10-CM

## 2020-06-24 DIAGNOSIS — Z6832 Body mass index (BMI) 32.0-32.9, adult: Secondary | ICD-10-CM

## 2020-06-24 MED ORDER — LOSARTAN POTASSIUM 25 MG PO TABS: ORAL_TABLET | ORAL | 0 refills | Status: DC

## 2020-06-25 NOTE — Progress Notes (Signed)
Chief Complaint:   OBESITY Shelley Ryan is here to discuss her progress with her obesity treatment plan along with follow-up of her obesity related diagnoses. Shelley Ryan is on the Category 2 Plan and keeping a food journal and adhering to recommended goals of 200-300 calories and 20 g protein and states she is following her eating plan approximately 90% of the time. Shelley Ryan states she is walking 30 minutes 5 times per week.  Today's visit was #: 24 Starting weight: 242 lbs Starting date: 07/02/2019 Today's weight: 210 lbs Today's date: 06/24/2020 Total lbs lost to date: 32 lbs Total lbs lost since last in-office visit: 2 lbs  Interim History: Shelley Ryan ambulatory BP readings are systolic 100's and diastolic 70's. She is experiencing orthostatic symptoms. She is on losartan 25 mg daily. She is down another 2 lbs for a total of 32 lbs!  Subjective:   1. Mixed hyperlipidemia Shelley Ryan was started on Crestor 10 mg once a week at last OV on 06/09/2020. She denies myalgias. She recently learned that both of her parents are on cholesterol medication.   2. Essential hypertension Shelley Ryan's ambulatory readings SBP 100's and DBP 70's. She has been experiencing dizziness with position changes. She estimates to drink 40 oz of water. If she drinks more, then she will experience leg cramps due to hyponatremia.  BP Readings from Last 3 Encounters:  06/24/20 108/73  06/09/20 115/77  05/27/20 121/78    3. Vitamin D deficiency Shelley Ryan's Vitamin D level was 45.9 on 05/27/2020. She is currently taking prescription vitamin D 50,000 IU each week. She denies nausea, vomiting or muscle weakness.   Ref. Range 05/27/2020 09:38  Vitamin D, 25-Hydroxy Latest Ref Range: 30.0 - 100.0 ng/mL 45.9   4. At risk for complication associated with hypotension The patient is at a higher than average risk of hypotension due to weight loss and taking an ARB.  Assessment/Plan:   1. Mixed hyperlipidemia Cardiovascular risk and specific  lipid/LDL goals reviewed.  We discussed several lifestyle modifications today and Shynia will continue to work on diet, exercise and weight loss efforts. Orders and follow up as documented in patient record. Check CMP in 4 weeks and lipid panel in 2.5 months.  Counseling Intensive lifestyle modifications are the first line treatment for this issue. . Dietary changes: Increase soluble fiber. Decrease simple carbohydrates. . Exercise changes: Moderate to vigorous-intensity aerobic activity 150 minutes per week if tolerated. . Lipid-lowering medications: see documented in medical record.  2. Essential hypertension Shelley Ryan is working on healthy weight loss and exercise to improve blood pressure control. We will watch for signs of hypotension as she continues her lifestyle modifications. Decrease losartan to 25 mg 1/2 tab daily. Check BP and heart rate at home and bring log to next OV. - losartan (COZAAR) 25 MG tablet; Take 1/2 tab QD  Dispense: 30 tablet; Refill: 0  3. Vitamin D deficiency Low Vitamin D level contributes to fatigue and are associated with obesity, breast, and colon cancer. She agrees to continue to take prescription Vitamin D @50 ,000 IU every week and will follow-up for routine testing of Vitamin D, at least 2-3 times per year to avoid over-replacement.  4. At risk for complication associated with hypotension Shelley Ryan was given approximately 15 minutes of education and counseling today to help avoid hypotension. We discussed risks of hypotension with weight loss and signs of hypotension such as feeling lightheaded or unsteady.  Repetitive spaced learning was employed today to elicit superior memory formation and behavioral change.  5. Class 1 obesity with serious comorbidity and body mass index (BMI) of 32.0 to 32.9 in adult, unspecified obesity type Shelley Ryan is currently in the action stage of change. As such, her goal is to continue with weight loss efforts. She has agreed to the Category  2 + 100 calories Plan and keeping a food journal and adhering to recommended goals of 200-300 calories and 20 g protein with breakfast.   Check CMP in 4 weeks.  Exercise goals: As is  Behavioral modification strategies: increasing lean protein intake, decreasing simple carbohydrates, meal planning and cooking strategies and planning for success.  Shelley Ryan has agreed to follow-up with our clinic in 2 weeks. She was informed of the importance of frequent follow-up visits to maximize her success with intensive lifestyle modifications for her multiple health conditions.   Objective:   Blood pressure 108/73, pulse 98, temperature 98 F (36.7 C), height 5\' 7"  (1.702 m), weight 210 lb (95.3 kg), SpO2 97 %. Body mass index is 32.89 kg/m.  General: Cooperative, alert, well developed, in no acute distress. HEENT: Conjunctivae and lids unremarkable. Cardiovascular: Regular rhythm.  Lungs: Normal work of breathing. Neurologic: No focal deficits.   Lab Results  Component Value Date   CREATININE 0.84 05/27/2020   BUN 18 05/27/2020   NA 131 (L) 05/27/2020   K 4.2 05/27/2020   CL 100 05/27/2020   CO2 19 (L) 05/27/2020   Lab Results  Component Value Date   ALT 18 05/27/2020   AST 21 05/27/2020   ALKPHOS 79 05/27/2020   BILITOT 0.4 05/27/2020   Lab Results  Component Value Date   HGBA1C 5.8 (H) 05/27/2020   HGBA1C 5.9 (H) 02/04/2020   HGBA1C 5.5 10/23/2019   HGBA1C 6.1 (H) 06/19/2019   Lab Results  Component Value Date   INSULIN 11.4 05/27/2020   INSULIN 10.9 02/04/2020   INSULIN 18.2 10/23/2019   INSULIN 13.7 07/02/2019   Lab Results  Component Value Date   TSH 3.060 07/02/2019   Lab Results  Component Value Date   CHOL 217 (H) 05/27/2020   HDL 51 05/27/2020   LDLCALC 144 (H) 05/27/2020   TRIG 122 05/27/2020   CHOLHDL 4.3 05/27/2020   Lab Results  Component Value Date   WBC 12.1 (H) 08/29/2019   HGB 13.6 08/29/2019   HCT 41.3 08/29/2019   MCV 91.0 08/29/2019   PLT  211 08/29/2019     Attestation Statements:   Reviewed by clinician on day of visit: allergies, medications, problem list, medical history, surgical history, family history, social history, and previous encounter notes.  10/29/2019, am acting as Edmund Hilda for Energy manager, NP.  I have reviewed the above documentation for accuracy and completeness, and I agree with the above. -  Margert Edsall d. Ja Pistole, NP-C

## 2020-06-26 ENCOUNTER — Other Ambulatory Visit (INDEPENDENT_AMBULATORY_CARE_PROVIDER_SITE_OTHER): Payer: Self-pay | Admitting: Adult Health

## 2020-06-26 DIAGNOSIS — E559 Vitamin D deficiency, unspecified: Secondary | ICD-10-CM

## 2020-06-28 ENCOUNTER — Other Ambulatory Visit (INDEPENDENT_AMBULATORY_CARE_PROVIDER_SITE_OTHER): Payer: Self-pay | Admitting: Adult Health

## 2020-06-28 DIAGNOSIS — I10 Essential (primary) hypertension: Secondary | ICD-10-CM

## 2020-07-08 ENCOUNTER — Ambulatory Visit (INDEPENDENT_AMBULATORY_CARE_PROVIDER_SITE_OTHER): Payer: BC Managed Care – PPO | Admitting: Adult Health

## 2020-07-08 ENCOUNTER — Other Ambulatory Visit: Payer: Self-pay

## 2020-07-08 ENCOUNTER — Encounter (INDEPENDENT_AMBULATORY_CARE_PROVIDER_SITE_OTHER): Payer: Self-pay | Admitting: Adult Health

## 2020-07-08 ENCOUNTER — Other Ambulatory Visit (INDEPENDENT_AMBULATORY_CARE_PROVIDER_SITE_OTHER): Payer: Self-pay | Admitting: Adult Health

## 2020-07-08 VITALS — BP 118/68 | HR 92 | Temp 98.2°F | Ht 67.0 in | Wt 210.0 lb

## 2020-07-08 DIAGNOSIS — E669 Obesity, unspecified: Secondary | ICD-10-CM

## 2020-07-08 DIAGNOSIS — Z6833 Body mass index (BMI) 33.0-33.9, adult: Secondary | ICD-10-CM | POA: Diagnosis not present

## 2020-07-08 DIAGNOSIS — Z9189 Other specified personal risk factors, not elsewhere classified: Secondary | ICD-10-CM | POA: Diagnosis not present

## 2020-07-08 DIAGNOSIS — E559 Vitamin D deficiency, unspecified: Secondary | ICD-10-CM

## 2020-07-08 DIAGNOSIS — I10 Essential (primary) hypertension: Secondary | ICD-10-CM | POA: Diagnosis not present

## 2020-07-08 MED ORDER — VITAMIN D (ERGOCALCIFEROL) 1.25 MG (50000 UNIT) PO CAPS: ORAL_CAPSULE | ORAL | 0 refills | Status: DC

## 2020-07-09 NOTE — Progress Notes (Signed)
Chief Complaint:   OBESITY Shelley Ryan is here to discuss her progress with her obesity treatment plan along with follow-up of her obesity related diagnoses. Shelley Ryan is on the Category 2 Plan and states she is following her eating plan approximately 80% of the time. Shelley Ryan states she is walking 30 minutes 4 times per week.  Today's visit was #: 25 Starting weight: 242 lbs Starting date: 07/02/2019 Today's weight: 210 lbs Today's date: 07/08/2020 Total lbs lost to date: 32 lbs Total lbs lost since last in-office visit: 0  Interim History: Shelley Ryan ate out 5 meals in the last 2 weeks. She is pleased with maintaining her weight!. She will travel to [place] at the end of the month for PT conference. She will be able to eat breakfast by herself and discussed strategies to stay on track for travel.  Subjective:   1. Vitamin D deficiency Shelley Ryan's Vitamin D level was below goal of 50, at 45.9 on 05/27/2020. She is currently taking prescription vitamin D 50,000 IU each week. She denies nausea, vomiting or muscle weakness.   Ref. Range 05/27/2020 09:38  Vitamin D, 25-Hydroxy Latest Ref Range: 30.0 - 100.0 ng/mL 45.9   2. Essential hypertension Shelley Ryan's BP and heart rate are excellent at OV today. She is on losartan 25 mg 1/2 tab QD. She denies dizziness with position changes. She has not been checking ambulatory BP.  BP Readings from Last 3 Encounters:  07/08/20 118/68  06/24/20 108/73  06/09/20 115/77    3. At risk for osteoporosis Shelley Ryan is at higher risk of osteopenia and osteoporosis due to Vitamin D deficiency and obesity.  Assessment/Plan:   1. Vitamin D deficiency Low Vitamin D level contributes to fatigue and are associated with obesity, breast, and colon cancer. She agrees to continue to take prescription Vitamin D @50 ,000 IU every week and will follow-up for routine testing of Vitamin D, at least 2-3 times per year to avoid over-replacement.  - Vitamin D, Ergocalciferol, (DRISDOL) 1.25 MG  (50000 UNIT) CAPS capsule; TAKE 1 CAPSULE BY MOUTH EVERY 7 DAYS  Dispense: 4 capsule; Refill: 0  2. Essential hypertension Shelley Ryan is working on healthy weight loss and exercise to improve blood pressure control. We will watch for signs of hypotension as she continues her lifestyle modifications. Continue losartan 25 mg 1/2 tab QD.  3. At risk for osteoporosis Shelley Ryan was given approximately 15 minutes of osteoporosis prevention counseling today. Shelley Ryan is at risk for osteopenia and osteoporosis due to her Vitamin D deficiency. She was encouraged to take her Vitamin D and follow her higher calcium diet and increase strengthening exercise to help strengthen her bones and decrease her risk of osteopenia and osteoporosis.  Repetitive spaced learning was employed today to elicit superior memory formation and behavioral change.  4. Class 1 obesity with serious comorbidity and body mass index (BMI) of 33.0 to 33.9 in adult, unspecified obesity type Shelley Ryan is currently in the action stage of change. As such, her goal is to continue with weight loss efforts. She has agreed to the Category 2 Plan.   Discussed cal/protein goals for each meal.  Exercise goals: As is  Behavioral modification strategies: increasing lean protein intake, decreasing simple carbohydrates, meal planning and cooking strategies, travel eating strategies and planning for success.  Shelley Ryan has agreed to follow-up with our clinic in 2 weeks. She was informed of the importance of frequent follow-up visits to maximize her success with intensive lifestyle modifications for her multiple health conditions.  Objective:   Blood pressure 118/68, pulse 92, temperature 98.2 F (36.8 C), height 5\' 7"  (1.702 m), weight 210 lb (95.3 kg), SpO2 98 %. Body mass index is 32.89 kg/m.  General: Cooperative, alert, well developed, in no acute distress. HEENT: Conjunctivae and lids unremarkable. Cardiovascular: Regular rhythm.  Lungs: Normal work of  breathing. Neurologic: No focal deficits.   Lab Results  Component Value Date   CREATININE 0.84 05/27/2020   BUN 18 05/27/2020   NA 131 (L) 05/27/2020   K 4.2 05/27/2020   CL 100 05/27/2020   CO2 19 (L) 05/27/2020   Lab Results  Component Value Date   ALT 18 05/27/2020   AST 21 05/27/2020   ALKPHOS 79 05/27/2020   BILITOT 0.4 05/27/2020   Lab Results  Component Value Date   HGBA1C 5.8 (H) 05/27/2020   HGBA1C 5.9 (H) 02/04/2020   HGBA1C 5.5 10/23/2019   HGBA1C 6.1 (H) 06/19/2019   Lab Results  Component Value Date   INSULIN 11.4 05/27/2020   INSULIN 10.9 02/04/2020   INSULIN 18.2 10/23/2019   INSULIN 13.7 07/02/2019   Lab Results  Component Value Date   TSH 3.060 07/02/2019   Lab Results  Component Value Date   CHOL 217 (H) 05/27/2020   HDL 51 05/27/2020   LDLCALC 144 (H) 05/27/2020   TRIG 122 05/27/2020   CHOLHDL 4.3 05/27/2020   Lab Results  Component Value Date   WBC 12.1 (H) 08/29/2019   HGB 13.6 08/29/2019   HCT 41.3 08/29/2019   MCV 91.0 08/29/2019   PLT 211 08/29/2019    Attestation Statements:   Reviewed by clinician on day of visit: allergies, medications, problem list, medical history, surgical history, family history, social history, and previous encounter notes.  10/29/2019, am acting as Edmund Hilda for Energy manager, NP.  I have reviewed the above documentation for accuracy and completeness, and I agree with the above. -  Collins Dimaria d. Iyauna Sing, NP-C

## 2020-07-10 ENCOUNTER — Ambulatory Visit (INDEPENDENT_AMBULATORY_CARE_PROVIDER_SITE_OTHER): Payer: BC Managed Care – PPO | Admitting: Adult Health

## 2020-07-28 ENCOUNTER — Ambulatory Visit (INDEPENDENT_AMBULATORY_CARE_PROVIDER_SITE_OTHER): Payer: BC Managed Care – PPO | Admitting: Adult Health

## 2020-07-28 ENCOUNTER — Other Ambulatory Visit: Payer: Self-pay

## 2020-07-28 ENCOUNTER — Encounter (INDEPENDENT_AMBULATORY_CARE_PROVIDER_SITE_OTHER): Payer: Self-pay | Admitting: Adult Health

## 2020-07-28 VITALS — BP 114/76 | HR 95 | Temp 97.9°F | Ht 67.0 in | Wt 210.0 lb

## 2020-07-28 DIAGNOSIS — Z9189 Other specified personal risk factors, not elsewhere classified: Secondary | ICD-10-CM

## 2020-07-28 DIAGNOSIS — E559 Vitamin D deficiency, unspecified: Secondary | ICD-10-CM

## 2020-07-28 DIAGNOSIS — Z6839 Body mass index (BMI) 39.0-39.9, adult: Secondary | ICD-10-CM | POA: Diagnosis not present

## 2020-07-28 DIAGNOSIS — I10 Essential (primary) hypertension: Secondary | ICD-10-CM | POA: Diagnosis not present

## 2020-07-28 MED ORDER — LOSARTAN POTASSIUM 25 MG PO TABS: ORAL_TABLET | ORAL | 0 refills | Status: DC

## 2020-07-28 MED ORDER — VITAMIN D (ERGOCALCIFEROL) 1.25 MG (50000 UNIT) PO CAPS: ORAL_CAPSULE | ORAL | 0 refills | Status: DC

## 2020-07-29 NOTE — Progress Notes (Signed)
Chief Complaint:   OBESITY Shelley Ryan is here to discuss her progress with her obesity treatment plan along with follow-up of her obesity related diagnoses. Shelley Ryan is on the Category 2 Plan and states she is following her eating plan approximately 90% of the time. Shelley Ryan states she is walking 20 minutes 5 times per week.  Today's visit was #: 26 Starting weight: 242 lbs Starting date: 07/02/2019 Today's weight: 210 lbs Today's date: 07/28/2020 Total lbs lost to date: 32 lbs Total lbs lost since last in-office visit: 0  Interim History: Shelley Ryan went to [facility]. for 5 days- work [employer] certified [employer]. Food was provided each day and she was unable to control meal choices, however she utilized [employer]. She was able to maintain her weight despite travel and eating out quite frequently- great job! Of Note: At program initiation started at size 22W and is now wearing size 10-12.  Subjective:   1. Vitamin D deficiency Shelley Ryan's Vitamin D level was just below goal of 50, at 45.9 on 05/27/2020. She is currently taking prescription vitamin D 50,000 IU each week. She denies nausea, vomiting or muscle weakness.   Ref. Range 05/27/2020 09:38  Vitamin D, 25-Hydroxy Latest Ref Range: 30.0 - 100.0 ng/mL 45.9   2. Essential hypertension Shelley Ryan's BP is stable at 114/76. She denies increase in fatigue or dizziness with position changes. She is on losartan 25 mg 1/2 tab QD.  BP Readings from Last 3 Encounters:  07/28/20 114/76  07/08/20 118/68  06/24/20 108/73   3. At risk for complication associated with hypotension Shelley Ryan is at risk for complications associated with hypotension due to steady weight lose and low dose losartan.  Assessment/Plan:   1. Vitamin D deficiency Low Vitamin D level contributes to fatigue and are associated with obesity, breast, and colon cancer. She agrees to continue to take prescription Vitamin D @50 ,000 IU every week and will follow-up for routine testing of Vitamin D, at least 2-3 times  per year to avoid over-replacement.  - Vitamin D, Ergocalciferol, (DRISDOL) 1.25 MG (50000 UNIT) CAPS capsule; TAKE 1 CAPSULE BY MOUTH EVERY 7 DAYS  Dispense: 4 capsule; Refill: 0  2. Essential hypertension Shelley Ryan is working on healthy weight loss and exercise to improve blood pressure control. We will watch for signs of hypotension as she continues her lifestyle modifications. Monitor closely for orthostatic hypotension.  - losartan (COZAAR) 25 MG tablet; Take 1/2 tab QD  Dispense: 30 tablet; Refill: 0  3. At risk for complication associated with hypotension Shelley Ryan was given approximately 15 minutes of education and counseling today to help avoid hypotension. We discussed risks of hypotension with weight loss and signs of hypotension such as feeling lightheaded or unsteady.  Repetitive spaced learning was employed today to elicit superior memory formation and behavioral change.  4. Current BMI 32.9 Shelley Ryan is currently in the action stage of change. As such, her goal is to continue with weight loss efforts. She has agreed to the Category 2 Plan.   Exercise goals: As is  Behavioral modification strategies: planning for success.  Shelley Ryan has agreed to follow-up with our clinic in 3 weeks. She was informed of the importance of frequent follow-up visits to maximize her success with intensive lifestyle modifications for her multiple health conditions.   Objective:   Blood pressure 114/76, pulse 95, temperature 97.9 F (36.6 C), height 5\' 7"  (1.702 m), weight 210 lb (95.3 kg), SpO2 98 %. Body mass index is 32.89 kg/m.  General: Cooperative, alert, well developed,  in no acute distress. HEENT: Conjunctivae and lids unremarkable. Cardiovascular: Regular rhythm.  Lungs: Normal work of breathing. Neurologic: No focal deficits.   Lab Results  Component Value Date   CREATININE 0.84 05/27/2020   BUN 18 05/27/2020   NA 131 (L) 05/27/2020   K 4.2 05/27/2020   CL 100 05/27/2020   CO2 19 (L)  05/27/2020   Lab Results  Component Value Date   ALT 18 05/27/2020   AST 21 05/27/2020   ALKPHOS 79 05/27/2020   BILITOT 0.4 05/27/2020   Lab Results  Component Value Date   HGBA1C 5.8 (H) 05/27/2020   HGBA1C 5.9 (H) 02/04/2020   HGBA1C 5.5 10/23/2019   HGBA1C 6.1 (H) 06/19/2019   Lab Results  Component Value Date   INSULIN 11.4 05/27/2020   INSULIN 10.9 02/04/2020   INSULIN 18.2 10/23/2019   INSULIN 13.7 07/02/2019   Lab Results  Component Value Date   TSH 3.060 07/02/2019   Lab Results  Component Value Date   CHOL 217 (H) 05/27/2020   HDL 51 05/27/2020   LDLCALC 144 (H) 05/27/2020   TRIG 122 05/27/2020   CHOLHDL 4.3 05/27/2020   Lab Results  Component Value Date   WBC 12.1 (H) 08/29/2019   HGB 13.6 08/29/2019   HCT 41.3 08/29/2019   MCV 91.0 08/29/2019   PLT 211 08/29/2019    Attestation Statements:   Reviewed by clinician on day of visit: allergies, medications, problem list, medical history, surgical history, family history, social history, and previous encounter notes.  Edmund Hilda, am acting as Energy manager for William Hamburger, NP.  I have reviewed the above documentation for accuracy and completeness, and I agree with the above. -  Shelley Nish d. Oral Remache, NP-C

## 2020-07-31 ENCOUNTER — Other Ambulatory Visit (INDEPENDENT_AMBULATORY_CARE_PROVIDER_SITE_OTHER): Payer: Self-pay | Admitting: Adult Health

## 2020-07-31 DIAGNOSIS — E559 Vitamin D deficiency, unspecified: Secondary | ICD-10-CM

## 2020-08-18 ENCOUNTER — Ambulatory Visit (INDEPENDENT_AMBULATORY_CARE_PROVIDER_SITE_OTHER): Payer: BC Managed Care – PPO | Admitting: Family Medicine

## 2020-08-18 ENCOUNTER — Encounter (INDEPENDENT_AMBULATORY_CARE_PROVIDER_SITE_OTHER): Payer: Self-pay | Admitting: Family Medicine

## 2020-08-18 ENCOUNTER — Other Ambulatory Visit: Payer: Self-pay

## 2020-08-18 VITALS — BP 110/75 | HR 94 | Temp 98.3°F | Ht 67.0 in | Wt 208.0 lb

## 2020-08-18 DIAGNOSIS — I10 Essential (primary) hypertension: Secondary | ICD-10-CM

## 2020-08-18 DIAGNOSIS — E559 Vitamin D deficiency, unspecified: Secondary | ICD-10-CM | POA: Diagnosis not present

## 2020-08-18 DIAGNOSIS — E782 Mixed hyperlipidemia: Secondary | ICD-10-CM | POA: Diagnosis not present

## 2020-08-18 DIAGNOSIS — Z6839 Body mass index (BMI) 39.0-39.9, adult: Secondary | ICD-10-CM

## 2020-08-18 MED ORDER — VITAMIN D (ERGOCALCIFEROL) 1.25 MG (50000 UNIT) PO CAPS: 1.0000 | ORAL_CAPSULE | ORAL | 0 refills | Status: DC

## 2020-08-18 MED ORDER — ROSUVASTATIN CALCIUM 10 MG PO TABS: ORAL_TABLET | ORAL | 0 refills | Status: DC

## 2020-08-19 ENCOUNTER — Encounter (INDEPENDENT_AMBULATORY_CARE_PROVIDER_SITE_OTHER): Payer: Self-pay | Admitting: Family Medicine

## 2020-08-19 LAB — COMPREHENSIVE METABOLIC PANEL
ALT: 17 IU/L (ref 0–32)
AST: 17 IU/L (ref 0–40)
Albumin/Globulin Ratio: 1.8 (ref 1.2–2.2)
Albumin: 4.6 g/dL (ref 3.8–4.8)
Alkaline Phosphatase: 69 IU/L (ref 44–121)
BUN/Creatinine Ratio: 19 (ref 9–23)
BUN: 18 mg/dL (ref 6–24)
Bilirubin Total: 0.4 mg/dL (ref 0.0–1.2)
CO2: 20 mmol/L (ref 20–29)
Calcium: 9.3 mg/dL (ref 8.7–10.2)
Chloride: 102 mmol/L (ref 96–106)
Creatinine, Ser: 0.96 mg/dL (ref 0.57–1.00)
Globulin, Total: 2.6 g/dL (ref 1.5–4.5)
Glucose: 92 mg/dL (ref 65–99)
Potassium: 4.4 mmol/L (ref 3.5–5.2)
Sodium: 142 mmol/L (ref 134–144)
Total Protein: 7.2 g/dL (ref 6.0–8.5)
eGFR: 74 mL/min/{1.73_m2} (ref >59)

## 2020-08-19 NOTE — Progress Notes (Signed)
Chief Complaint:   OBESITY Shelley Ryan is here to discuss Shelley Ryan progress with Shelley Ryan obesity treatment plan along with follow-up of Shelley Ryan obesity related diagnoses. Shelley Ryan is on the Category 2 Plan and states she is following Shelley Ryan eating plan approximately 80% of the time. Shelley Ryan states she is walking for 20 minutes 5 times per week.  Today's visit was #: 27 Starting weight: 242 lbs Starting date: 07/02/2019 Today's weight: 208 lbs Today's date: 08/18/2020 Total lbs lost to date: 34 Total lbs lost since last in-office visit: 2  Interim History: Shelley Ryan has done very well on our plan, and she has lost 34 lbs since 07/02/19. Shelley Ryan highest weight ever was 259 lbs. She lost some weight before starting with [place]- down to 242.Shelley Ryan She journals at supper sometimes. Shelley Ryan hunger is satisfied. She denies excessive cravings. We discussed Shelley Ryan weight goal and decided on 175 lbs (27 BMI).  Subjective:   1. Mixed hyperlipidemia Israella's last LDL was elevated at 144, and Shelley Ryan HDL and triglycerides were within normal limits. 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 She has a family history of hyperlipidemia. She was started on Crestor 2 months ago.   Lab Results  Component Value Date   ALT 17 08/18/2020   AST 17 08/18/2020   ALKPHOS 69 08/18/2020   BILITOT 0.4 08/18/2020   Lab Results  Component Value Date   CHOL 217 (H) 05/27/2020   HDL 51 05/27/2020   LDLCALC 144 (H) 05/27/2020   TRIG 122 05/27/2020   CHOLHDL 4.3 05/27/2020   2. Vitamin D deficiency Aunna's last Vit D level was low at 45.9. She is on weekly prescription Vit D.  3. Essential hypertension Harriett Sine notes dizziness when standing from a seated or lying postion at times She is on losartan 25 mg (1/2 tablet). Shelley Ryan blood pressure is 110 over 75 today. She notes Shelley Ryan resting pulse range in the 60's.   BP Readings from Last 3 Encounters:  08/18/20 110/75  07/28/20 114/76  07/08/20 118/68   Lab Results  Component Value Date   CREATININE 0.96  08/18/2020   CREATININE 0.84 05/27/2020   CREATININE 0.83 02/04/2020   Assessment/Plan:   1. Mixed hyperlipidemia  We will check labs today, and we will refill Crestor for 1 month.  - Comprehensive metabolic panel - rosuvastatin (CRESTOR) 10 MG tablet; One tablet by mouth once per week  Dispense: 12 tablet; Refill: 0  2. Vitamin D deficiency  We will refill prescription Vitamin D 50,000 IU every week for 90 days with no refills. Shelley Ryan will follow-up for routine testing of Vitamin D, at least 2-3 times per year to avoid over-replacement.  - Vitamin D, Ergocalciferol, (DRISDOL) 1.25 MG (50000 UNIT) CAPS capsule; Take 1 capsule (50,000 Units total) by mouth every 7 (seven) days.  Dispense: 12 capsule; Refill: 0  3. Essential hypertension Shelley Ryan agreed to discontinue losartan, and will continue to monitor Shelley Ryan blood pressure.   4. Current BMI 32.9 Shelley Ryan is currently in the action stage of change. As such, Shelley Ryan goal is to continue with weight loss efforts. She has agreed to the Category 2 Plan.   Exercise goals: For substantial health benefits, adults should do at least 150 minutes (2 hours and 30 minutes) a week of moderate-intensity, or 75 minutes (1 hour and 15 minutes) a week of vigorous-intensity aerobic physical activity, or an equivalent combination of moderate- and vigorous-intensity aerobic activity. Aerobic activity should be performed in episodes of at least 10  minutes, and preferably, it should be spread throughout the week.  Behavioral modification strategies: planning for success.  Shelley Ryan has agreed to follow-up with our clinic in 3 weeks with William Hamburger, NP.  Shelley Ryan was informed we would discuss Shelley Ryan lab results at Shelley Ryan next visit unless there is a critical issue that needs to be addressed sooner. Shelley Ryan agreed to keep Shelley Ryan next visit at the agreed upon time to discuss these results.  Objective:   Blood pressure 110/75, pulse 94, temperature 98.3 F (36.8 C), height 5\' 7"  (1.702  m), weight 208 lb (94.3 kg), SpO2 97 %. Body mass index is 32.58 kg/m.  General: Cooperative, alert, well developed, in no acute distress. HEENT: Conjunctivae and lids unremarkable. Cardiovascular: Regular rhythm.  Lungs: Normal work of breathing. Neurologic: No focal deficits.   Lab Results  Component Value Date   CREATININE 0.96 08/18/2020   BUN 18 08/18/2020   NA 142 08/18/2020   K 4.4 08/18/2020   CL 102 08/18/2020   CO2 20 08/18/2020   Lab Results  Component Value Date   ALT 17 08/18/2020   AST 17 08/18/2020   ALKPHOS 69 08/18/2020   BILITOT 0.4 08/18/2020   Lab Results  Component Value Date   HGBA1C 5.8 (H) 05/27/2020   HGBA1C 5.9 (H) 02/04/2020   HGBA1C 5.5 10/23/2019   HGBA1C 6.1 (H) 06/19/2019   Lab Results  Component Value Date   INSULIN 11.4 05/27/2020   INSULIN 10.9 02/04/2020   INSULIN 18.2 10/23/2019   INSULIN 13.7 07/02/2019   Lab Results  Component Value Date   TSH 3.060 07/02/2019   Lab Results  Component Value Date   CHOL 217 (H) 05/27/2020   HDL 51 05/27/2020   LDLCALC 144 (H) 05/27/2020   TRIG 122 05/27/2020   CHOLHDL 4.3 05/27/2020   Lab Results  Component Value Date   WBC 12.1 (H) 08/29/2019   HGB 13.6 08/29/2019   HCT 41.3 08/29/2019   MCV 91.0 08/29/2019   PLT 211 08/29/2019   No results found for: IRON, TIBC, FERRITIN  Attestation Statements:   Reviewed by clinician on day of visit: allergies, medications, problem list, medical history, surgical history, family history, social history, and previous encounter notes.   10/29/2019, am acting as Trude Mcburney for Energy manager, FNP-C.  I have reviewed the above documentation for accuracy and completeness, and I agree with the above. -  [place], FNP

## 2020-09-08 ENCOUNTER — Other Ambulatory Visit: Payer: Self-pay | Admitting: Family Medicine

## 2020-09-08 DIAGNOSIS — Z1231 Encounter for screening mammogram for malignant neoplasm of breast: Secondary | ICD-10-CM

## 2020-09-11 ENCOUNTER — Ambulatory Visit (INDEPENDENT_AMBULATORY_CARE_PROVIDER_SITE_OTHER): Payer: BC Managed Care – PPO | Admitting: Adult Health

## 2020-09-11 ENCOUNTER — Other Ambulatory Visit: Payer: Self-pay

## 2020-09-11 ENCOUNTER — Encounter (INDEPENDENT_AMBULATORY_CARE_PROVIDER_SITE_OTHER): Payer: Self-pay | Admitting: Adult Health

## 2020-09-11 VITALS — BP 123/86 | HR 74 | Temp 98.1°F | Ht 67.0 in | Wt 208.0 lb

## 2020-09-11 DIAGNOSIS — E782 Mixed hyperlipidemia: Secondary | ICD-10-CM

## 2020-09-11 DIAGNOSIS — E669 Obesity, unspecified: Secondary | ICD-10-CM | POA: Diagnosis not present

## 2020-09-11 DIAGNOSIS — R7303 Prediabetes: Secondary | ICD-10-CM

## 2020-09-11 DIAGNOSIS — Z6832 Body mass index (BMI) 32.0-32.9, adult: Secondary | ICD-10-CM

## 2020-09-11 DIAGNOSIS — E66811 Obesity, class 1: Secondary | ICD-10-CM

## 2020-09-15 NOTE — Progress Notes (Signed)
Chief Complaint:   OBESITY Shelley Ryan is here to discuss her progress with her obesity treatment plan along with follow-up of her obesity related diagnoses. Shelley Ryan is on the Category 2 Plan and states she is following her eating plan approximately 85% of the time. Shelley Ryan states she is not currently exercising.  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  These work demands have made exercise difficult due to lack of time.  Subjective:   1. Mixed hyperlipidemia Shelley Ryan was started on Crestor 10 mg on 06/09/2020. 08/18/2020 CMP- LFT's WNL.  She is tolerating statin well.  2. Pre-diabetes 05/27/2020 A1c 5.8.  Shelley Ryan is on Metformin 500 mg BID and denies GI upset.  Assessment/Plan:   1. Mixed hyperlipidemia Cardiovascular risk and specific lipid/LDL goals reviewed.  We discussed several lifestyle modifications today and Shelley Ryan will continue to work on diet, exercise and weight loss efforts. Orders and follow up as documented in patient record.  -Check labs at next OV.  Counseling Intensive lifestyle modifications are the first line treatment for this issue. . Dietary changes: Increase soluble fiber. Decrease simple carbohydrates. . Exercise changes: Moderate to vigorous-intensity aerobic activity 150 minutes per week if tolerated. . Lipid-lowering medications: see documented in medical record.  2. Pre-diabetes Shelley Ryan will continue to work on weight loss, exercise, and decreasing simple carbohydrates to help decrease the risk of diabetes.  -Check labs at next OV.  3. Class 1 obesity with serious comorbidity and body mass index (BMI) of 32.0  to 32.9 in adult, unspecified obesity type Shelley Ryan is currently in the action stage of change. As such, her goal is to continue with weight loss efforts. She has agreed to the Category 2 Plan.   Check fasting labs/IC at next OV. Check waist circumference at next OV. Pt is aware to arrive 30 minutes prior to appt.  Exercise goals: Increase daily walking and add in resistance training 2 days a week.  Behavioral modification strategies: increasing lean protein intake, decreasing simple carbohydrates, meal planning and cooking strategies and planning for success.  Shelley Ryan has agreed to follow-up with our clinic in 3 weeks- fasting/IC. She was informed of the importance of frequent follow-up visits to maximize her success with intensive lifestyle modifications for her multiple health conditions.   Objective:   Blood pressure 123/86, pulse 74, temperature 98.1 F (36.7 C), height 5\' 7"  (1.702 m), weight 208 lb (94.3 kg), SpO2 98 %. Body mass index is 32.58 kg/m.  General: Cooperative, alert, well developed, in no acute distress. HEENT: Conjunctivae and lids unremarkable. Cardiovascular: Regular rhythm.  Lungs: Normal work of breathing. Neurologic: No focal deficits.   Lab Results  Component Value Date   CREATININE 0.96 08/18/2020   BUN 18 08/18/2020   NA 142 08/18/2020   K 4.4 08/18/2020   CL 102 08/18/2020   CO2 20 08/18/2020   Lab Results  Component Value Date   ALT 17 08/18/2020   AST 17 08/18/2020   ALKPHOS 69 08/18/2020   BILITOT 0.4 08/18/2020   Lab Results  Component Value Date   HGBA1C 5.8 (H) 05/27/2020   HGBA1C 5.9 (H) 02/04/2020   HGBA1C  5.5 10/23/2019   HGBA1C 6.1 (H) 06/19/2019   Lab Results  Component Value Date   INSULIN 11.4 05/27/2020   INSULIN 10.9 02/04/2020   INSULIN 18.2 10/23/2019   INSULIN 13.7 07/02/2019   Lab Results  Component Value Date   TSH 3.060 07/02/2019   Lab Results  Component Value Date   CHOL 217 (H) 05/27/2020   HDL 51  05/27/2020   LDLCALC 144 (H) 05/27/2020   TRIG 122 05/27/2020   CHOLHDL 4.3 05/27/2020   Lab Results  Component Value Date   WBC 12.1 (H) 08/29/2019   HGB 13.6 08/29/2019   HCT 41.3 08/29/2019   MCV 91.0 08/29/2019   PLT 211 08/29/2019   No results found for: IRON, TIBC, FERRITIN   Attestation Statements:   Reviewed by clinician on day of visit: allergies, medications, problem list, medical history, surgical history, family history, social history, and previous encounter notes.  Time spent on visit including pre-visit chart review and post-visit care and charting was 30 minutes.   Edmund Hilda, CMA, am acting as transcriptionist for William Hamburger, NP.  I have reviewed the above documentation for accuracy and completeness, and I agree with the above. -  Kammie Scioli d. Rockey Guarino, NP-C

## 2020-10-06 IMAGING — CT CT HEAD W/O CM
3 series · 15 of 47 positions shown, 18 images · non-contrast
Comparison: 07/04/2019

CLINICAL DATA: History of pseudotumor cerebri.  Double vision.

EXAM:
CT HEAD WITHOUT CONTRAST
TECHNIQUE: Contiguous axial images were obtained from the base of the skull
through the vertex without intravenous contrast.

[Series 2: head wo · axial · 0.45mm/px · z∈[+705,+835]mm · 9 of 32 slices shown, 12 images]
[im 3/32  brain]
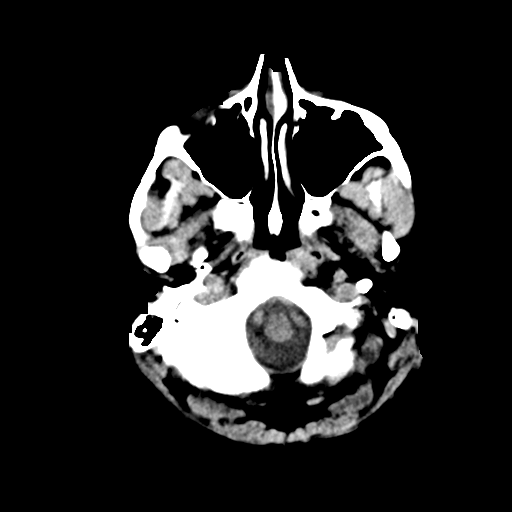
[im 3/32  bone]
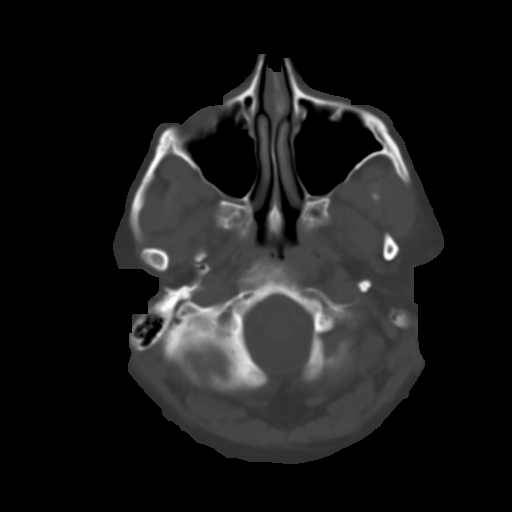
[im 6/32  brain]
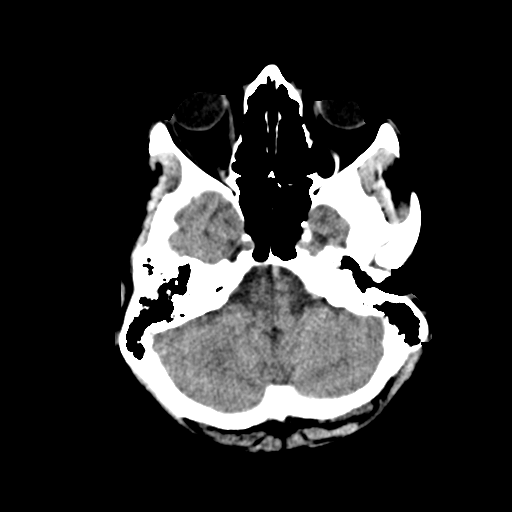
[im 9/32  brain]
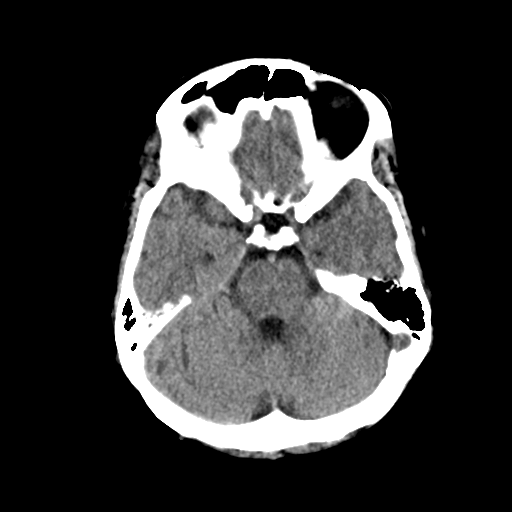
[im 12/32  brain]
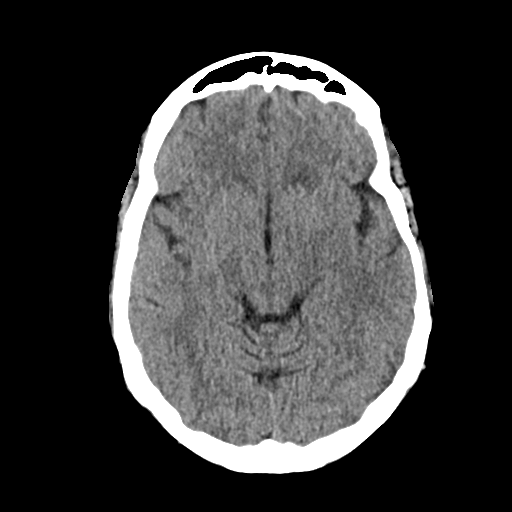
[im 17/32  brain]
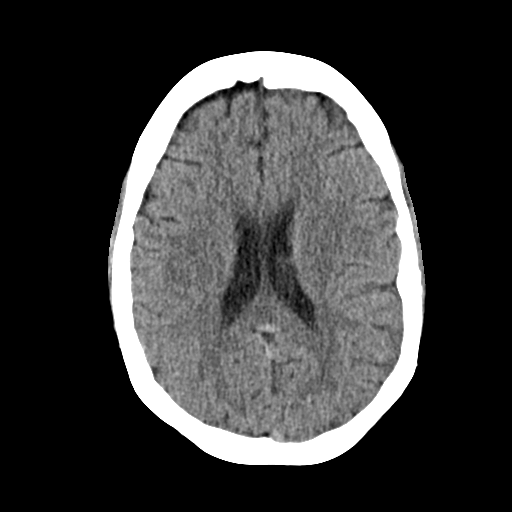
[im 17/32  bone]
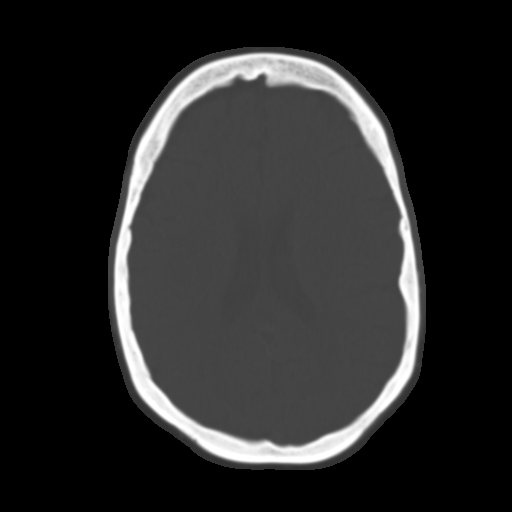
[im 20/32  brain]
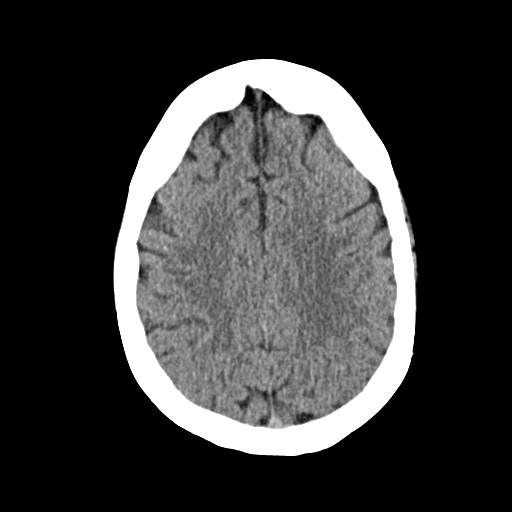
[im 23/32  brain]
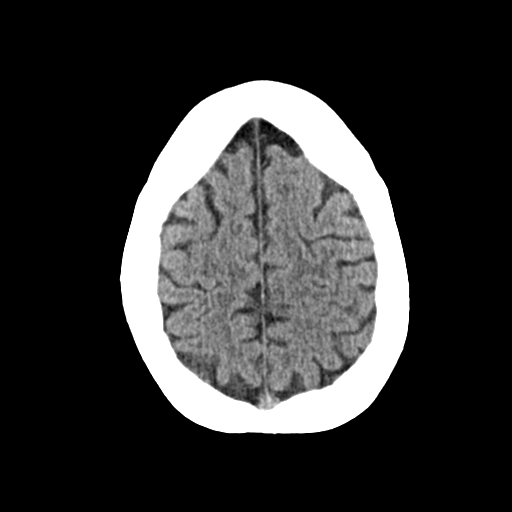
[im 26/32  brain]
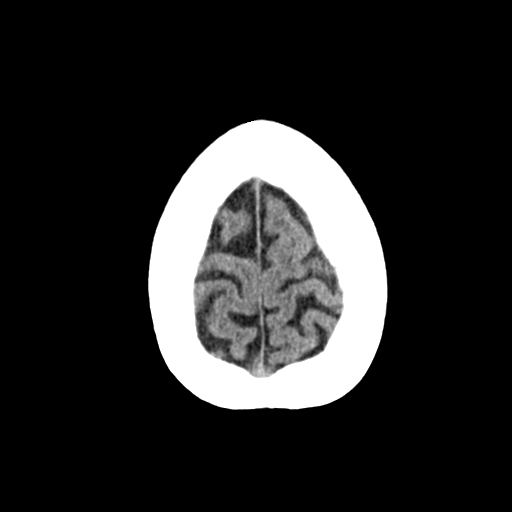
[im 29/32  brain]
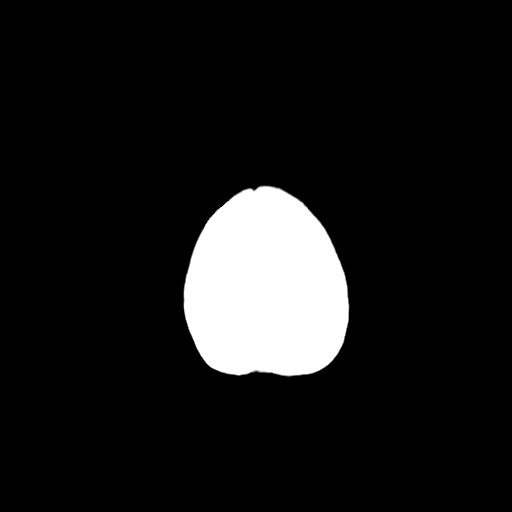
[im 29/32  bone]
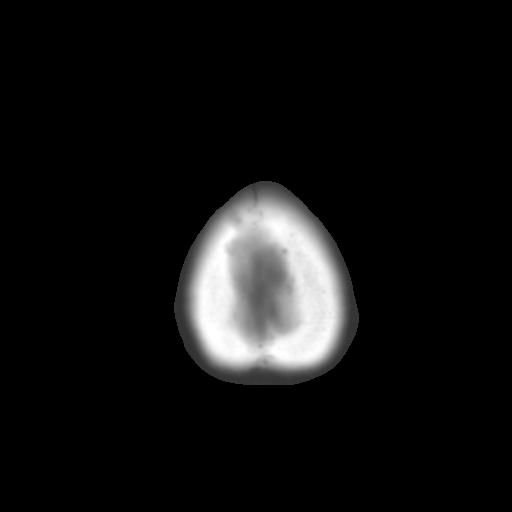

[Series 4: coronal soft · coronal · 0.32mm/px · 3 of 67 slices shown]
[im 23/67  brain]
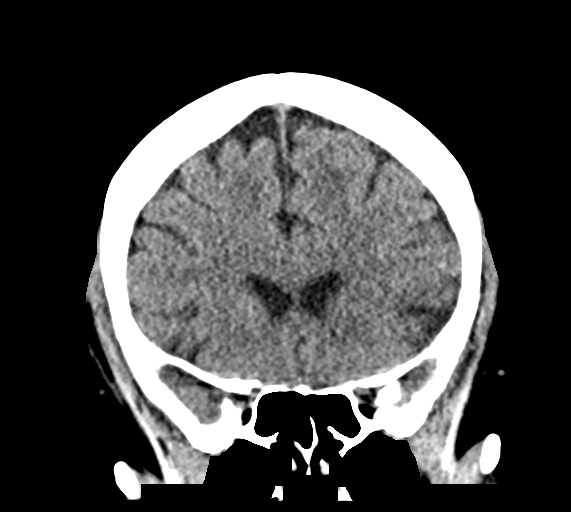
[im 30/67  brain]
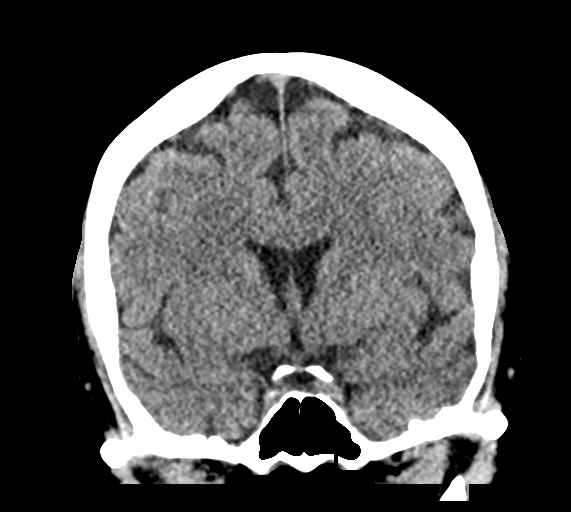
[im 37/67  brain]
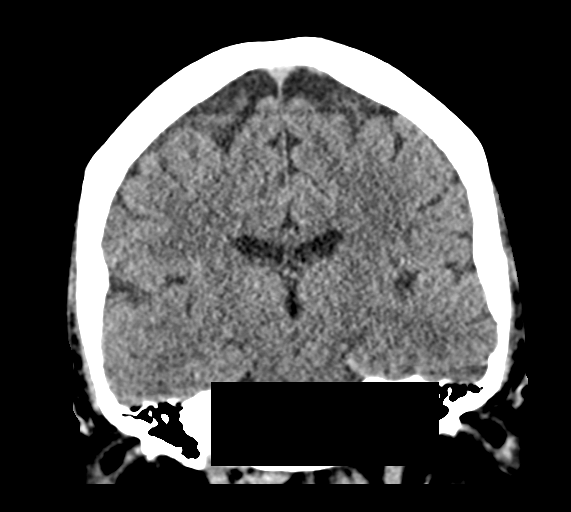

[Series 5: sag soft · sagittal · 0.31mm/px · 3 of 55 slices shown]
[im 19/55  brain]
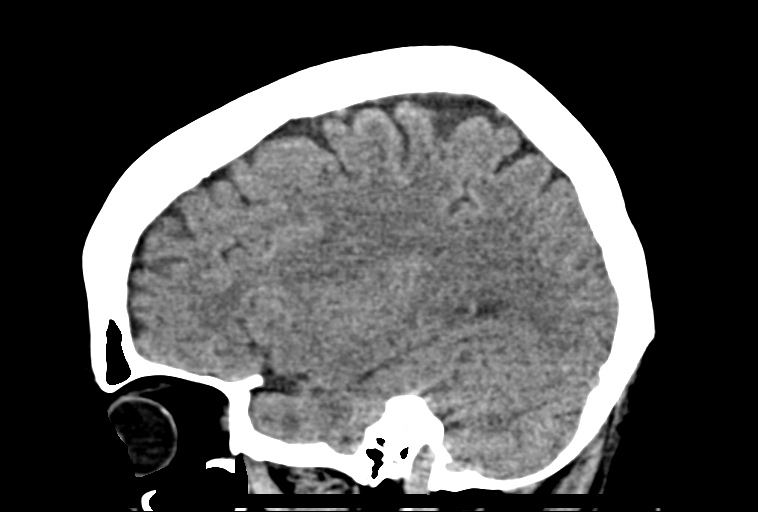
[im 28/55  brain]
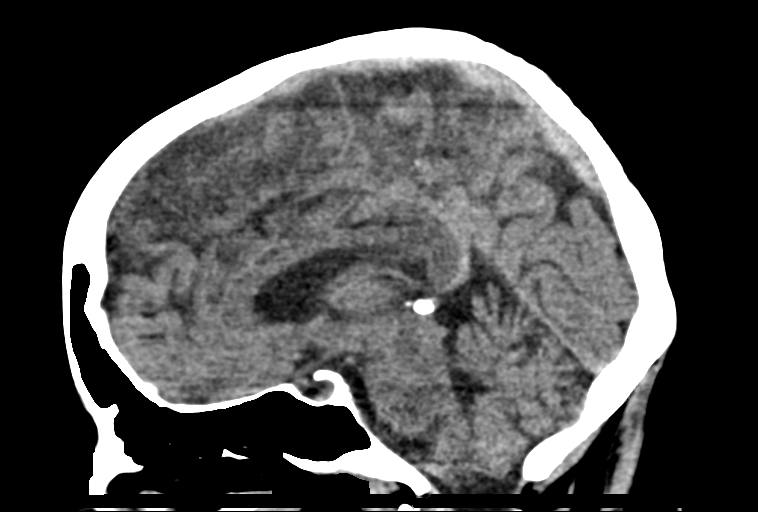
[im 37/55  brain]
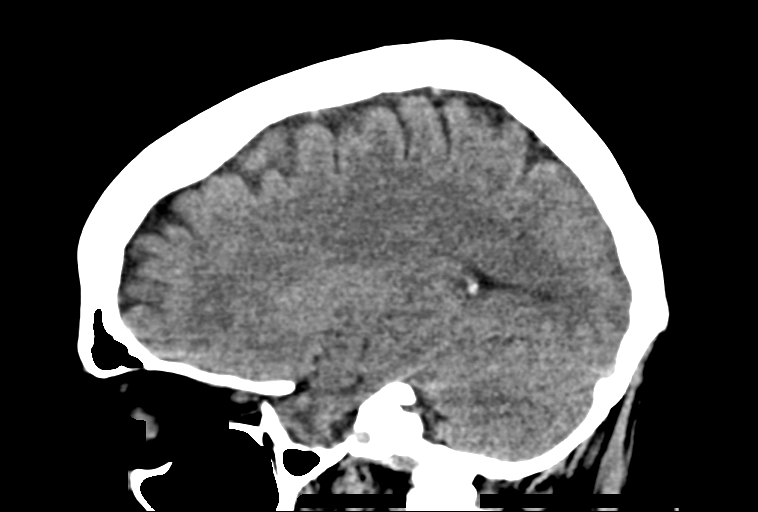

[15 of 47 positions shown; findings below may reference images not displayed]

FINDINGS: Brain: No acute intracranial abnormality. Specifically, no
hemorrhage, hydrocephalus, mass lesion, acute infarction, or
significant intracranial injury.

Vascular: No hyperdense vessel or unexpected calcification.

Skull: No acute calvarial abnormality.

Sinuses/Orbits: Visualized paranasal sinuses and mastoids clear.
Orbital soft tissues unremarkable.

Other: None
IMPRESSION: No acute intracranial abnormality.

## 2020-10-13 ENCOUNTER — Ambulatory Visit (INDEPENDENT_AMBULATORY_CARE_PROVIDER_SITE_OTHER): Payer: BC Managed Care – PPO | Admitting: Adult Health

## 2020-10-13 ENCOUNTER — Encounter (INDEPENDENT_AMBULATORY_CARE_PROVIDER_SITE_OTHER): Payer: Self-pay | Admitting: Adult Health

## 2020-10-13 ENCOUNTER — Other Ambulatory Visit: Payer: Self-pay

## 2020-10-13 VITALS — BP 137/90 | HR 75 | Temp 98.3°F | Ht 67.0 in | Wt 212.0 lb

## 2020-10-13 DIAGNOSIS — R7303 Prediabetes: Secondary | ICD-10-CM

## 2020-10-13 DIAGNOSIS — R0602 Shortness of breath: Secondary | ICD-10-CM | POA: Diagnosis not present

## 2020-10-13 DIAGNOSIS — I10 Essential (primary) hypertension: Secondary | ICD-10-CM | POA: Diagnosis not present

## 2020-10-13 DIAGNOSIS — E782 Mixed hyperlipidemia: Secondary | ICD-10-CM

## 2020-10-13 DIAGNOSIS — Z9189 Other specified personal risk factors, not elsewhere classified: Secondary | ICD-10-CM | POA: Diagnosis not present

## 2020-10-13 DIAGNOSIS — E559 Vitamin D deficiency, unspecified: Secondary | ICD-10-CM | POA: Diagnosis not present

## 2020-10-13 DIAGNOSIS — Z6839 Body mass index (BMI) 39.0-39.9, adult: Secondary | ICD-10-CM

## 2020-10-14 LAB — LIPID PANEL
Chol/HDL Ratio: 3.4 ratio (ref 0.0–4.4)
Cholesterol, Total: 199 mg/dL (ref 100–199)
HDL: 58 mg/dL (ref >39)
LDL Chol Calc (NIH): 122 mg/dL — ABNORMAL HIGH (ref 0–99)
Triglycerides: 106 mg/dL (ref 0–149)
VLDL Cholesterol Cal: 19 mg/dL (ref 5–40)

## 2020-10-14 LAB — COMPREHENSIVE METABOLIC PANEL
ALT: 16 IU/L (ref 0–32)
AST: 15 IU/L (ref 0–40)
Albumin/Globulin Ratio: 2.1 (ref 1.2–2.2)
Albumin: 5 g/dL — ABNORMAL HIGH (ref 3.8–4.8)
Alkaline Phosphatase: 80 IU/L (ref 44–121)
BUN/Creatinine Ratio: 21 (ref 9–23)
BUN: 18 mg/dL (ref 6–24)
Bilirubin Total: 0.4 mg/dL (ref 0.0–1.2)
CO2: 20 mmol/L (ref 20–29)
Calcium: 9.5 mg/dL (ref 8.7–10.2)
Chloride: 103 mmol/L (ref 96–106)
Creatinine, Ser: 0.86 mg/dL (ref 0.57–1.00)
Globulin, Total: 2.4 g/dL (ref 1.5–4.5)
Glucose: 80 mg/dL (ref 65–99)
Potassium: 4.4 mmol/L (ref 3.5–5.2)
Sodium: 139 mmol/L (ref 134–144)
Total Protein: 7.4 g/dL (ref 6.0–8.5)
eGFR: 84 mL/min/{1.73_m2} (ref >59)

## 2020-10-14 LAB — INSULIN, RANDOM: INSULIN: 10.9 u[IU]/mL (ref 2.6–24.9)

## 2020-10-14 LAB — HEMOGLOBIN A1C
Est. average glucose Bld gHb Est-mCnc: 117 mg/dL
Hgb A1c MFr Bld: 5.7 % — ABNORMAL HIGH (ref 4.8–5.6)

## 2020-10-14 LAB — VITAMIN D 25 HYDROXY (VIT D DEFICIENCY, FRACTURES): Vit D, 25-Hydroxy: 43.2 ng/mL (ref 30.0–100.0)

## 2020-10-14 NOTE — Progress Notes (Signed)
Chief Complaint:   OBESITY Shelley Ryan is here to discuss her progress with her obesity treatment plan along with follow-up of her obesity related diagnoses. Shelley Ryan is on the Category 2 Plan and states she is following her eating plan approximately 75% of the time. Shelley Ryan states she is walking between 5K-15K steps daily 30 minutes 5 times per week.  Today's visit was #: 29 Starting weight: 242 lbs Starting date: 07/02/2019 Today's weight: 212 lbs Today's date: 10/13/2020 Total lbs lost to date: 30 Total lbs lost since last in-office visit: 0  Interim History: Dannielle has been traveling 3 out 4 weeks and eating out a lot more. She traveled to [place], [place] and [place], [place]. She will be in [place], [place]. next week- has strategies in place [place]  Subjective:   1. SOB (shortness of breath) Shelley Ryan reports mild SOBE. She denies CP with extreme exertion. RMR has been steadily increasing- excellent. Current RMR- 2265!  2. Essential hypertension BP/HR stable at OV today.  BP Readings from Last 3 Encounters:  10/13/20 137/90  09/11/20 123/86  08/18/20 110/75   3. Vitamin D deficiency Shelley Ryan's Vitamin D level was 45.9 on 05/27/2020. She is currently taking prescription vitamin D 50,000 IU each week. She denies nausea, vomiting or muscle weakness.  4. Mixed hyperlipidemia Shelley Ryan started on Crestor 10 mg once a week 06/09/2020. She has significant family history of HLD.  Lab Results  Component Value Date   ALT 16 10/13/2020   AST 15 10/13/2020   ALKPHOS 80 10/13/2020   BILITOT 0.4 10/13/2020   Lab Results  Component Value Date   CHOL 199 10/13/2020   HDL 58 10/13/2020   LDLCALC 122 (H) 10/13/2020   TRIG 106 10/13/2020   CHOLHDL 3.4 10/13/2020   5. Pre-diabetes Shelley Ryan is on Metformin 500 mg BID.  Lab Results  Component Value Date   HGBA1C 5.7 (H) 10/13/2020   Lab Results  Component Value Date   INSULIN 10.9 10/13/2020   INSULIN 11.4  05/27/2020   INSULIN 10.9 02/04/2020   INSULIN 18.2 10/23/2019   INSULIN 13.7 07/02/2019   6. At risk for heart disease Shelley Ryan is at a higher than average risk for cardiovascular disease due to obesity, HLD, and HTN.  Assessment/Plan:   1. SOB (shortness of breath) Shelley Ryan does feel that she gets out of breath more easily that she used to when she exercises. Shelley Ryan's shortness of breath appears to be obesity related and exercise induced. She has agreed to work on weight loss and gradually increase exercise to treat her exercise induced shortness of breath. Will continue to monitor closely. Check IC today.  2. Essential hypertension Shelley Ryan is working on healthy weight loss and exercise to improve blood pressure control. We will watch for signs of hypotension as she continues her lifestyle modifications. Check labs today.  - Comprehensive metabolic panel  3. Vitamin D deficiency Low Vitamin D level contributes to fatigue and are associated with obesity, breast, and colon cancer. She agrees to continue to take prescription Vitamin D @50 ,000 IU every week and will follow-up for routine testing of Vitamin D, at least 2-3 times per year to avoid over-replacement. Check labs today.  - VITAMIN D 25 Hydroxy (Vit-D Deficiency, Fractures)  4. Mixed hyperlipidemia Cardiovascular risk and specific lipid/LDL goals reviewed.  We discussed several lifestyle modifications today and Shelley Ryan will continue to work on diet, exercise and weight loss efforts. Orders and follow up as documented  in patient record.  Check labs today.  Counseling Intensive lifestyle modifications are the first line treatment for this issue. Dietary changes: Increase soluble fiber. Decrease simple carbohydrates. Exercise changes: Moderate to vigorous-intensity aerobic activity 150 minutes per week if tolerated. Lipid-lowering medications: see documented in medical record. - Lipid panel  5. Pre-diabetes Shelley Ryan will continue to work  on weight loss, exercise, and decreasing simple carbohydrates to help decrease the risk of diabetes.  Check labs today.  - Hemoglobin A1c - Insulin, random  6. At risk for heart disease Shelley Ryan was given approximately 15 minutes of coronary artery disease prevention counseling today. She is 45 y.o. female and has risk factors for heart disease including obesity. We discussed intensive lifestyle modifications today with an emphasis on specific weight loss instructions and strategies.   Repetitive spaced learning was employed today to elicit superior memory formation and behavioral change.  7. Current BMI 33.2  Shelley Ryan is currently in the action stage of change. As such, her goal is to continue with weight loss efforts. She has agreed to the Category 4 Plan when home and keeping a food journal and adhering to recommended goals of 1700-1800 calories and 130 g protein when traveling.   Exercise goals:  As is  Behavioral modification strategies: increasing lean protein intake, decreasing simple carbohydrates, meal planning and cooking strategies, keeping healthy foods in the home, and planning for success.  Shelley Ryan has agreed to follow-up with our clinic in 3 weeks. She was informed of the importance of frequent follow-up visits to maximize her success with intensive lifestyle modifications for her multiple health conditions.   Shelley Ryan was informed we would discuss her lab results at her next visit unless there is a critical issue that needs to be addressed sooner. Shelley Ryan agreed to keep her next visit at the agreed upon time to discuss these results.  Objective:   Blood pressure 137/90, pulse 75, temperature 98.3 F (36.8 C), height 5\' 7"  (1.702 m), weight 212 lb (96.2 kg), SpO2 100 %. Body mass index is 33.2 kg/m.  General: Cooperative, alert, well developed, in no acute distress. HEENT: Conjunctivae and lids unremarkable. Cardiovascular: Regular rhythm.  Lungs: Normal work of  breathing. Neurologic: No focal deficits.   Lab Results  Component Value Date   CREATININE 0.86 10/13/2020   BUN 18 10/13/2020   NA 139 10/13/2020   K 4.4 10/13/2020   CL 103 10/13/2020   CO2 20 10/13/2020   Lab Results  Component Value Date   ALT 16 10/13/2020   AST 15 10/13/2020   ALKPHOS 80 10/13/2020   BILITOT 0.4 10/13/2020   Lab Results  Component Value Date   HGBA1C 5.7 (H) 10/13/2020   HGBA1C 5.8 (H) 05/27/2020   HGBA1C 5.9 (H) 02/04/2020   HGBA1C 5.5 10/23/2019   HGBA1C 6.1 (H) 06/19/2019   Lab Results  Component Value Date   INSULIN 10.9 10/13/2020   INSULIN 11.4 05/27/2020   INSULIN 10.9 02/04/2020   INSULIN 18.2 10/23/2019   INSULIN 13.7 07/02/2019   Lab Results  Component Value Date   TSH 3.060 07/02/2019   Lab Results  Component Value Date   CHOL 199 10/13/2020   HDL 58 10/13/2020   LDLCALC 122 (H) 10/13/2020   TRIG 106 10/13/2020   CHOLHDL 3.4 10/13/2020   Lab Results  Component Value Date   WBC 12.1 (H) 08/29/2019   HGB 13.6 08/29/2019   HCT 41.3 08/29/2019   MCV 91.0 08/29/2019   PLT 211 08/29/2019   No  results found for: IRON, TIBC, FERRITIN  Attestation Statements:   Reviewed by clinician on day of visit: allergies, medications, problem list, medical history, surgical history, family history, social history, and previous encounter notes.  Edmund Hilda, CMA, am acting as transcriptionist for William Hamburger, NP.  I have reviewed the above documentation for accuracy and completeness, and I agree with the above. -  Koreen Lizaola d. Jerry Clyne. NP-C

## 2020-11-04 ENCOUNTER — Ambulatory Visit
Admission: RE | Admit: 2020-11-04 | Discharge: 2020-11-04 | Disposition: A | Discharge status: Home / Self Care | Payer: BC Managed Care – PPO | Source: Ambulatory Visit | Attending: Family Medicine | Admitting: Family Medicine

## 2020-11-04 ENCOUNTER — Other Ambulatory Visit: Payer: Self-pay

## 2020-11-04 DIAGNOSIS — Z1231 Encounter for screening mammogram for malignant neoplasm of breast: Secondary | ICD-10-CM

## 2020-11-14 ENCOUNTER — Other Ambulatory Visit (INDEPENDENT_AMBULATORY_CARE_PROVIDER_SITE_OTHER): Payer: Self-pay | Admitting: Family Medicine

## 2020-11-14 DIAGNOSIS — E782 Mixed hyperlipidemia: Secondary | ICD-10-CM

## 2020-11-17 ENCOUNTER — Other Ambulatory Visit: Payer: Self-pay

## 2020-11-17 ENCOUNTER — Ambulatory Visit (INDEPENDENT_AMBULATORY_CARE_PROVIDER_SITE_OTHER): Payer: BC Managed Care – PPO | Admitting: Adult Health

## 2020-11-17 ENCOUNTER — Encounter (INDEPENDENT_AMBULATORY_CARE_PROVIDER_SITE_OTHER): Payer: Self-pay | Admitting: Adult Health

## 2020-11-17 VITALS — BP 125/83 | HR 90 | Temp 98.3°F | Ht 67.0 in | Wt 215.0 lb

## 2020-11-17 DIAGNOSIS — E782 Mixed hyperlipidemia: Secondary | ICD-10-CM | POA: Diagnosis not present

## 2020-11-17 DIAGNOSIS — R7303 Prediabetes: Secondary | ICD-10-CM

## 2020-11-17 DIAGNOSIS — E559 Vitamin D deficiency, unspecified: Secondary | ICD-10-CM | POA: Diagnosis not present

## 2020-11-17 DIAGNOSIS — Z6839 Body mass index (BMI) 39.0-39.9, adult: Secondary | ICD-10-CM

## 2020-11-17 DIAGNOSIS — Z9189 Other specified personal risk factors, not elsewhere classified: Secondary | ICD-10-CM | POA: Diagnosis not present

## 2020-11-17 MED ORDER — VITAMIN D (ERGOCALCIFEROL) 1.25 MG (50000 UNIT) PO CAPS: 50000.0000 [IU] | ORAL_CAPSULE | ORAL | 0 refills | Status: DC

## 2020-11-17 MED ORDER — ROSUVASTATIN CALCIUM 10 MG PO TABS: ORAL_TABLET | ORAL | 0 refills | Status: DC

## 2020-11-19 NOTE — Progress Notes (Signed)
Chief Complaint:   OBESITY Shelley Ryan is here to discuss her progress with her obesity treatment plan along with follow-up of her obesity related diagnoses. Shelley Ryan is on the Category 4 Plan and keeping a food journal and adhering to recommended goals of 1700-1800 calories and 130 g protein when traveling and states she is following her eating plan approximately 70% of the time. Shelley Ryan states she is hiking and walking 6 miles.  Today's visit was #: 30 Starting weight: 242 lbs Starting date: 07/02/2019 Today's weight: 215 lbs Today's date: 11/17/2020 Total lbs lost to date: 27 Total lbs lost since last in-office visit: 0  Interim History: Shelley Ryan has been traveling frequently.  In June, she went to [place] and [place]., and was eating out more frequently during travel.  he has been keeping calories to around 1800 cal/day.  Subjective:   1. Mixed hyperlipidemia Discussed labs with patient today. Shelley Ryan was started on Crestor 10 mg once a week on 06/09/2020.  10/13/20 lipid panel- LDL decreased to 122, however, still above goal.  10/13/20 CMP- LFT's within normal limits. Shelley Ryan denies myalgias.  2. Vitamin D deficiency Discussed labs with patient today. 10/13/20 Vit D level was 43.2 - just below goal of 50. She is currently taking prescription vitamin D 50,000 IU each week. She denies nausea, vomiting or muscle weakness.   3. Pre-diabetes Discussed labs with patient today. 10/13/20 BG 80, A1c 5.7 (steadily decreasing at last several checks). Karsen's insulin level is 10.9.  She is on Metformin 500 mg BID and denies GI upset.  4. At risk for heart disease Shelley Ryan is at a higher than average risk for cardiovascular disease due to obesity and hyperlipidemia.  Assessment/Plan:   1. Mixed hyperlipidemia Shelley Ryan will increase Crestor to twice a week. Cardiovascular risk and specific lipid/LDL goals reviewed.  We discussed several lifestyle modifications today and Shelley Ryan will continue to work  on diet, exercise and weight loss efforts. Orders and follow up as documented in patient record.   Counseling Intensive lifestyle modifications are the first line treatment for this issue. Dietary changes: Increase soluble fiber. Decrease simple carbohydrates. Exercise changes: Moderate to vigorous-intensity aerobic activity 150 minutes per week if tolerated. Lipid-lowering medications: see documented in medical record.  Increase and Refill- rosuvastatin (CRESTOR) 10 MG tablet; One tablet by mouth twice per week (Sunday and Wed)  Dispense: 24 tablet; Refill: 0  2. Vitamin D deficiency Low Vitamin D level contributes to fatigue and are associated with obesity, breast, and colon cancer. She agrees to continue to take prescription Vitamin D @50 ,000 IU every week and will follow-up for routine testing of Vitamin D, at least 2-3 times per year to avoid over-replacement.  Refill- Vitamin D, Ergocalciferol, (DRISDOL) 1.25 MG (50000 UNIT) CAPS capsule; Take 1 capsule (50,000 Units total) by mouth every 7 (seven) days.  Dispense: 12 capsule; Refill: 0  3. Pre-diabetes Shelley Ryan will continue category 4 and Metformin as directed. She will continue to work on weight loss, exercise, and decreasing simple carbohydrates to help decrease the risk of diabetes.   4. At risk for heart disease Shelley Ryan was given approximately 15 minutes of coronary artery disease prevention counseling today. She is 45 y.o. female and has risk factors for heart disease including obesity. We discussed intensive lifestyle modifications today with an emphasis on specific weight loss instructions and strategies.   Repetitive spaced learning was employed today to elicit superior memory formation and behavioral change.  5. Current BMI 33.7  47  is currently in the action stage of change. As such, her goal is to continue with weight loss efforts. She has agreed to the Category 4 Plan.   Exercise goals:  As is  Behavioral modification  strategies: increasing lean protein intake, decreasing simple carbohydrates, meal planning and cooking strategies, keeping healthy foods in the home, planning for success, and keeping a strict food journal.  Shelley Ryan has agreed to follow-up with our clinic in 2-3 weeks. She was informed of the importance of frequent follow-up visits to maximize her success with intensive lifestyle modifications for her multiple health conditions.   Objective:   Blood pressure 125/83, pulse 90, temperature 98.3 F (36.8 C), height 5\' 7"  (1.702 m), weight 215 lb (97.5 kg), SpO2 98 %. Body mass index is 33.67 kg/m.  General: Cooperative, alert, well developed, in no acute distress. HEENT: Conjunctivae and lids unremarkable. Cardiovascular: Regular rhythm.  Lungs: Normal work of breathing. Neurologic: No focal deficits.   Lab Results  Component Value Date   CREATININE 0.86 10/13/2020   BUN 18 10/13/2020   NA 139 10/13/2020   K 4.4 10/13/2020   CL 103 10/13/2020   CO2 20 10/13/2020   Lab Results  Component Value Date   ALT 16 10/13/2020   AST 15 10/13/2020   ALKPHOS 80 10/13/2020   BILITOT 0.4 10/13/2020   Lab Results  Component Value Date   HGBA1C 5.7 (H) 10/13/2020   HGBA1C 5.8 (H) 05/27/2020   HGBA1C 5.9 (H) 02/04/2020   HGBA1C 5.5 10/23/2019   HGBA1C 6.1 (H) 06/19/2019   Lab Results  Component Value Date   INSULIN 10.9 10/13/2020   INSULIN 11.4 05/27/2020   INSULIN 10.9 02/04/2020   INSULIN 18.2 10/23/2019   INSULIN 13.7 07/02/2019   Lab Results  Component Value Date   TSH 3.060 07/02/2019   Lab Results  Component Value Date   CHOL 199 10/13/2020   HDL 58 10/13/2020   LDLCALC 122 (H) 10/13/2020   TRIG 106 10/13/2020   CHOLHDL 3.4 10/13/2020   Lab Results  Component Value Date   VD25OH 43.2 10/13/2020   VD25OH 45.9 05/27/2020   VD25OH 45.8 02/04/2020   Lab Results  Component Value Date   WBC 12.1 (H) 08/29/2019   HGB 13.6 08/29/2019   HCT 41.3 08/29/2019   MCV 91.0  08/29/2019   PLT 211 08/29/2019    Attestation Statements:   Reviewed by clinician on day of visit: allergies, medications, problem list, medical history, surgical history, family history, social history, and previous encounter notes.  10/29/2019, CMA, am acting as transcriptionist for Edmund Hilda, NP.  I have reviewed the above documentation for accuracy and completeness, and I agree with the above. -  Chynna Buerkle d. [place]ord, NP-C

## 2020-12-01 ENCOUNTER — Other Ambulatory Visit: Payer: Self-pay

## 2020-12-01 ENCOUNTER — Encounter (INDEPENDENT_AMBULATORY_CARE_PROVIDER_SITE_OTHER): Payer: Self-pay | Admitting: Adult Health

## 2020-12-01 ENCOUNTER — Ambulatory Visit (INDEPENDENT_AMBULATORY_CARE_PROVIDER_SITE_OTHER): Payer: BC Managed Care – PPO | Admitting: Adult Health

## 2020-12-01 VITALS — BP 130/83 | HR 98 | Temp 98.2°F | Ht 67.0 in | Wt 216.0 lb

## 2020-12-01 DIAGNOSIS — R7303 Prediabetes: Secondary | ICD-10-CM

## 2020-12-01 DIAGNOSIS — E782 Mixed hyperlipidemia: Secondary | ICD-10-CM | POA: Diagnosis not present

## 2020-12-01 DIAGNOSIS — Z6839 Body mass index (BMI) 39.0-39.9, adult: Secondary | ICD-10-CM | POA: Diagnosis not present

## 2020-12-02 NOTE — Progress Notes (Signed)
Chief Complaint:   OBESITY Shelley Ryan is here to discuss her progress with her obesity treatment plan along with follow-up of her obesity related diagnoses. Shelley Ryan is on the Category 4 Plan and states she is following her eating plan approximately 95% of the time. Shelley Ryan states she is walking 20 minutes 5 times per week.  Today's visit was #: 31 Starting weight: 242 lbs Starting date: 07/02/2019 Today's weight: 216 lbs Today's date: 12/01/2020 Total lbs lost to date: 26 Total lbs lost since last in-office visit: 0  Interim History: When tracking intake, Shelley Ryan will on average consume 1400 cal/day.  Reviewed Body Composition Information with pt.  Subjective:   1. Pre-diabetes Shelley Ryan's A1c has been fluctuating between 5.7-5.9. She is on Metformin 500 mg BID and denies GI upset.  2. Mixed hyperlipidemia Crestor 10 mg was increased from once weekly to twice a week, due to LDL being above goal. She reports increased myalgias.  Assessment/Plan:   1. Pre-diabetes Shelley Ryan will continue to work on weight loss, exercise, and decreasing simple carbohydrates to help decrease the risk of diabetes. Continue Metformin as directed.  2. Mixed hyperlipidemia Shelley Ryan will increase water intake and strive for at least 100 oz per day. Cardiovascular risk and specific lipid/LDL goals reviewed.  We discussed several lifestyle modifications today and Shelley Ryan will continue to work on diet, exercise and weight loss efforts. Orders and follow up as documented in patient record.   Counseling Intensive lifestyle modifications are the first line treatment for this issue. Dietary changes: Increase soluble fiber. Decrease simple carbohydrates. Exercise changes: Moderate to vigorous-intensity aerobic activity 150 minutes per week if tolerated. Lipid-lowering medications: see documented in medical record.  3. Current BMI 34.0  Shelley Ryan is currently in the action stage of change. As such, her goal is to continue with weight  loss efforts. She has agreed to the Category 4 Plan.   Ensure to consume 1700-1800 cal/day.  Exercise goals:  As is  Behavioral modification strategies: increasing lean protein intake, decreasing simple carbohydrates, meal planning and cooking strategies, keeping healthy foods in the home, avoiding temptations, and planning for success.  Shelley Ryan has agreed to follow-up with our clinic in 2 weeks. She was informed of the importance of frequent follow-up visits to maximize her success with intensive lifestyle modifications for her multiple health conditions.   Objective:   Blood pressure 130/83, pulse 98, temperature 98.2 F (36.8 C), height 5\' 7"  (1.702 m), weight 216 lb (98 kg), SpO2 100 %. Body mass index is 33.83 kg/m.  General: Cooperative, alert, well developed, in no acute distress. HEENT: Conjunctivae and lids unremarkable. Cardiovascular: Regular rhythm.  Lungs: Normal work of breathing. Neurologic: No focal deficits.   Lab Results  Component Value Date   CREATININE 0.86 10/13/2020   BUN 18 10/13/2020   NA 139 10/13/2020   K 4.4 10/13/2020   CL 103 10/13/2020   CO2 20 10/13/2020   Lab Results  Component Value Date   ALT 16 10/13/2020   AST 15 10/13/2020   ALKPHOS 80 10/13/2020   BILITOT 0.4 10/13/2020   Lab Results  Component Value Date   HGBA1C 5.7 (H) 10/13/2020   HGBA1C 5.8 (H) 05/27/2020   HGBA1C 5.9 (H) 02/04/2020   HGBA1C 5.5 10/23/2019   HGBA1C 6.1 (H) 06/19/2019   Lab Results  Component Value Date   INSULIN 10.9 10/13/2020   INSULIN 11.4 05/27/2020   INSULIN 10.9 02/04/2020   INSULIN 18.2 10/23/2019   INSULIN 13.7 07/02/2019  Lab Results  Component Value Date   TSH 3.060 07/02/2019   Lab Results  Component Value Date   CHOL 199 10/13/2020   HDL 58 10/13/2020   LDLCALC 122 (H) 10/13/2020   TRIG 106 10/13/2020   CHOLHDL 3.4 10/13/2020   Lab Results  Component Value Date   VD25OH 43.2 10/13/2020   VD25OH 45.9 05/27/2020   VD25OH 45.8  02/04/2020   Lab Results  Component Value Date   WBC 12.1 (H) 08/29/2019   HGB 13.6 08/29/2019   HCT 41.3 08/29/2019   MCV 91.0 08/29/2019   PLT 211 08/29/2019    Attestation Statements:   Reviewed by clinician on day of visit: allergies, medications, problem list, medical history, surgical history, family history, social history, and previous encounter notes.  Time spent on visit including pre-visit chart review and post-visit care and charting was 28 minutes.   Edmund Hilda, CMA, am acting as transcriptionist for William Hamburger, NP.  I have reviewed the above documentation for accuracy and completeness, and I agree with the above. -  Lisvet Rasheed d. Maurissa Ambrose, NP-C

## 2020-12-22 ENCOUNTER — Encounter (INDEPENDENT_AMBULATORY_CARE_PROVIDER_SITE_OTHER): Payer: Self-pay | Admitting: Adult Health

## 2020-12-22 ENCOUNTER — Ambulatory Visit (INDEPENDENT_AMBULATORY_CARE_PROVIDER_SITE_OTHER): Payer: BC Managed Care – PPO | Admitting: Adult Health

## 2020-12-22 ENCOUNTER — Other Ambulatory Visit: Payer: Self-pay

## 2020-12-22 VITALS — BP 139/82 | HR 99 | Temp 98.1°F | Ht 67.0 in | Wt 221.0 lb

## 2020-12-22 DIAGNOSIS — G43109 Migraine with aura, not intractable, without status migrainosus: Secondary | ICD-10-CM | POA: Diagnosis not present

## 2020-12-22 DIAGNOSIS — R7303 Prediabetes: Secondary | ICD-10-CM

## 2020-12-22 DIAGNOSIS — Z9189 Other specified personal risk factors, not elsewhere classified: Secondary | ICD-10-CM

## 2020-12-22 DIAGNOSIS — Z6839 Body mass index (BMI) 39.0-39.9, adult: Secondary | ICD-10-CM | POA: Diagnosis not present

## 2020-12-22 MED ORDER — VICTOZA 18 MG/3ML ~~LOC~~ SOPN: PEN_INJECTOR | SUBCUTANEOUS | 0 refills | Status: DC

## 2020-12-22 MED ORDER — INSULIN PEN NEEDLE 32G X 4 MM MISC: 1.0000 [IU] | Freq: Two times a day (BID) | 0 refills | Status: DC

## 2020-12-22 NOTE — Progress Notes (Signed)
Chief Complaint:   OBESITY Randee is here to discuss her progress with her obesity treatment plan along with follow-up of her obesity related diagnoses. Paizley is on the Category 4 Plan and states she is following her eating plan approximately 90% of the time. Caleesi states she is doing cardio for 20-30 minutes 5 times per week.  Today's visit was #: 32 Starting weight: 242 lbs Starting date: 07/02/2019 Today's weight: 221 lbs Today's date: 12/22/2020 Total lbs lost to date: 21 lbs Total lbs lost since last in-office visit: 0  Interim History: Hugh is experiencing increased migraine activity - 2-3 headaches per week with associated dizziness and left hand numbness/tingling.  [facility] has ordered a CT of  head and will start her on [name] infusing therapy every 3 months.   She is experiencing polyphagia- is on Metformin 500mg  BID. She estimates to be following the plan 90% of the time.  Subjective:   1. Migraine with aura and without status migrainosus, not intractable Conna is experiencing increased migraine activity - 2-3 headaches per week with associated dizziness and left hand numbness/tingling.  [facility] has ordered a CT of  head and will start her on [name] infusing therapy every 3 months.   2. Prediabetes Deasiah has a diagnosis of prediabetes based on her elevated HgA1c and was informed this puts her at greater risk of developing diabetes. She continues to work on diet and exercise to decrease her risk of diabetes. She denies nausea or hypoglycemia.  A1c steadily improving with metformin 500 mg twice daily - still experiencing polyphagia.   She denies family history of MTC or personal history of pancreatitis.  Lab Results  Component Value Date   HGBA1C 5.7 (H) 10/13/2020   Lab Results  Component Value Date   INSULIN 10.9 10/13/2020   INSULIN 11.4 05/27/2020   INSULIN 10.9 02/04/2020   INSULIN 18.2 10/23/2019   INSULIN 13.7 07/02/2019   3. At risk for  constipation Lacosta is at increased risk for constipation due to changing from metformin to Victoza for prediabetes.  Assessment/Plan:   1. Migraine with aura and without status migrainosus, not intractable Follow-up with [facility].  2. Prediabetes Jennica will continue to work on weight loss, exercise, and decreasing simple carbohydrates to help decrease the risk of diabetes. Start Victoza 0.6 mg once a day.  Stop metformin once on GLP-1 therapy.  - Start liraglutide (VICTOZA) 18 MG/3ML SOPN; 0.6mg  inject once daily.  Stop Metformin once starting Victoza  Dispense: 3 mL; Refill: 0 - Insulin Pen Needle 32G X 4 MM MISC; 1 Units by Does not apply route in the morning and at bedtime.  Dispense: 100 each; Refill: 0  3. At risk for constipation Shavanna was given approximately 15 minutes of counseling today regarding prevention of constipation. She was encouraged to increase water and fiber intake.    4. Current BMI 34.6  Shirleymae is currently in the action stage of change. As such, her goal is to continue with weight loss efforts. She has agreed to the Category 4 Plan.   Exercise goals:  As is.  Behavioral modification strategies: increasing lean protein intake, decreasing simple carbohydrates, meal planning and cooking strategies, keeping healthy foods in the home, and planning for success.  Grettel has agreed to follow-up with our clinic in 2 weeks. She was informed of the importance of frequent follow-up visits to maximize her success with intensive lifestyle modifications for her multiple health conditions.   Objective:  Blood pressure 139/82, pulse 99, temperature 98.1 F (36.7 C), height 5\' 7"  (1.702 m), weight 221 lb (100.2 kg), SpO2 96 %. Body mass index is 34.61 kg/m.  General: Cooperative, alert, well developed, in no acute distress. HEENT: Conjunctivae and lids unremarkable. Cardiovascular: Regular rhythm.  Lungs: Normal work of breathing. Neurologic: No focal deficits.    Lab Results  Component Value Date   CREATININE 0.86 10/13/2020   BUN 18 10/13/2020   NA 139 10/13/2020   K 4.4 10/13/2020   CL 103 10/13/2020   CO2 20 10/13/2020   Lab Results  Component Value Date   ALT 16 10/13/2020   AST 15 10/13/2020   ALKPHOS 80 10/13/2020   BILITOT 0.4 10/13/2020   Lab Results  Component Value Date   HGBA1C 5.7 (H) 10/13/2020   HGBA1C 5.8 (H) 05/27/2020   HGBA1C 5.9 (H) 02/04/2020   HGBA1C 5.5 10/23/2019   HGBA1C 6.1 (H) 06/19/2019   Lab Results  Component Value Date   INSULIN 10.9 10/13/2020   INSULIN 11.4 05/27/2020   INSULIN 10.9 02/04/2020   INSULIN 18.2 10/23/2019   INSULIN 13.7 07/02/2019   Lab Results  Component Value Date   TSH 3.060 07/02/2019   Lab Results  Component Value Date   CHOL 199 10/13/2020   HDL 58 10/13/2020   LDLCALC 122 (H) 10/13/2020   TRIG 106 10/13/2020   CHOLHDL 3.4 10/13/2020   Lab Results  Component Value Date   VD25OH 43.2 10/13/2020   VD25OH 45.9 05/27/2020   VD25OH 45.8 02/04/2020   Lab Results  Component Value Date   WBC 12.1 (H) 08/29/2019   HGB 13.6 08/29/2019   HCT 41.3 08/29/2019   MCV 91.0 08/29/2019   PLT 211 08/29/2019   Attestation Statements:   Reviewed by clinician on day of visit: allergies, medications, problem list, medical history, surgical history, family history, social history, and previous encounter notes.  I, 10/29/2019, CMA, am acting as Insurance claims handler for Energy manager, NP.  I have reviewed the above documentation for accuracy and completeness, and I agree with the above. -  Anay Walter d. Eurydice Calixto, NP-C

## 2021-01-05 ENCOUNTER — Encounter (INDEPENDENT_AMBULATORY_CARE_PROVIDER_SITE_OTHER): Payer: Self-pay | Admitting: Adult Health

## 2021-01-05 ENCOUNTER — Ambulatory Visit (INDEPENDENT_AMBULATORY_CARE_PROVIDER_SITE_OTHER): Payer: BC Managed Care – PPO | Admitting: Adult Health

## 2021-01-05 ENCOUNTER — Other Ambulatory Visit: Payer: Self-pay

## 2021-01-05 VITALS — BP 136/83 | HR 97 | Temp 98.1°F | Ht 67.0 in | Wt 216.0 lb

## 2021-01-05 DIAGNOSIS — Z9189 Other specified personal risk factors, not elsewhere classified: Secondary | ICD-10-CM

## 2021-01-05 DIAGNOSIS — G43109 Migraine with aura, not intractable, without status migrainosus: Secondary | ICD-10-CM

## 2021-01-05 DIAGNOSIS — R7303 Prediabetes: Secondary | ICD-10-CM | POA: Diagnosis not present

## 2021-01-05 DIAGNOSIS — Z6839 Body mass index (BMI) 39.0-39.9, adult: Secondary | ICD-10-CM

## 2021-01-05 MED ORDER — VICTOZA 18 MG/3ML ~~LOC~~ SOPN: 1.2000 mg | PEN_INJECTOR | Freq: Every day | SUBCUTANEOUS | 0 refills | Status: DC

## 2021-01-05 NOTE — Progress Notes (Signed)
Chief Complaint:   OBESITY Shelley Ryan is here to discuss her progress with her obesity treatment plan along with follow-up of her obesity related diagnoses. Shelley Ryan is on the Category 4 Plan and states she is following her eating plan approximately 90% of the time. Shelley Ryan states she is walking for 20-30 minutes 4-5 times per week.  Today's visit was #: 33 Starting weight: 242 lbs Starting date: 07/02/2019 Today's weight: 216 lbs Today's date: 01/05/2021 Total lbs lost to date: 5 lbs Total lbs lost since last in-office visit: 26 lbs  Interim History: 12/22/2020-Metformin was stopped Shelley Ryan started liraglutide 0.6mg  QD. She reports excellent reduction of cravings since initiating GLP-1 therapy. She denies mass in neck, dysphagia, dyspepsia, or persistent hoarseness. She is down 5 lbs since last OV.   Subjective:   1. Prediabetes 12/22/2020-Metformin was stopped Shelley Ryan started liraglutide 0.6mg  QD. She reports excellent reduction of cravings since initiating GLP-1 therapy. She denies mass in neck, dysphagia, dyspepsia, or persistent hoarseness. She is down 5 lbs since last OV.  Lab Results  Component Value Date   HGBA1C 5.7 (H) 10/13/2020   Lab Results  Component Value Date   INSULIN 10.9 10/13/2020   INSULIN 11.4 05/27/2020   INSULIN 10.9 02/04/2020   INSULIN 18.2 10/23/2019   INSULIN 13.7 07/02/2019   2. Migraine with aura and without status migrainosus, not intractable She has been experiencing migraines with aura over the last 4 days - vestibular aura - left-sided with left arm paraesthesia. She reports constant pain rated 5/10 , described as a dull ache.  PRN medication is not relieving sx's.  3. At risk for nausea Shelley Ryan is at risk for nausea due to increasing her GLP-1 for prediabetes.  Assessment/Plan:   1. Prediabetes Increase liraglutide 1.2 mg daily, dispense 6 mL, 0 refills.  2. Migraine with aura and without status migrainosus, not intractable Follow-up with  Neurology.  Will start [name] injections next Monday.  3. At risk for nausea Shelley Ryan was given approximately 15 minutes of nausea prevention counseling today. Shelley Ryan is at risk for nausea due to her new or current medication. She was encouraged to titrate her medication slowly, make sure to stay hydrated, eat smaller portions throughout the day, and avoid high fat meals.   4. Current BMI 34.0  Shelley Ryan is currently in the action stage of change. As such, her goal is to continue with weight loss efforts. She has agreed to the Category 4 Plan.   Exercise goals:  As is.  Behavioral modification strategies: increasing lean protein intake, decreasing simple carbohydrates, meal planning and cooking strategies, keeping healthy foods in the home, and planning for success.  Shelley Ryan has agreed to follow-up with our clinic in 2 weeks. She was informed of the importance of frequent follow-up visits to maximize her success with intensive lifestyle modifications for her multiple health conditions.   Objective:   Blood pressure 136/83, pulse 97, temperature 98.1 F (36.7 C), height 5\' 7"  (1.702 m), weight 216 lb (98 kg), SpO2 98 %. Body mass index is 33.83 kg/m.  General: Cooperative, alert, well developed, in no acute distress. HEENT: Conjunctivae and lids unremarkable. Cardiovascular: Regular rhythm.  Lungs: Normal work of breathing. Neurologic: No focal deficits.   Lab Results  Component Value Date   CREATININE 0.86 10/13/2020   BUN 18 10/13/2020   NA 139 10/13/2020   K 4.4 10/13/2020   CL 103 10/13/2020   CO2 20 10/13/2020   Lab Results  Component Value Date  ALT 16 10/13/2020   AST 15 10/13/2020   ALKPHOS 80 10/13/2020   BILITOT 0.4 10/13/2020   Lab Results  Component Value Date   HGBA1C 5.7 (H) 10/13/2020   HGBA1C 5.8 (H) 05/27/2020   HGBA1C 5.9 (H) 02/04/2020   HGBA1C 5.5 10/23/2019   HGBA1C 6.1 (H) 06/19/2019   Lab Results  Component Value Date   INSULIN 10.9  10/13/2020   INSULIN 11.4 05/27/2020   INSULIN 10.9 02/04/2020   INSULIN 18.2 10/23/2019   INSULIN 13.7 07/02/2019   Lab Results  Component Value Date   TSH 3.060 07/02/2019   Lab Results  Component Value Date   CHOL 199 10/13/2020   HDL 58 10/13/2020   LDLCALC 122 (H) 10/13/2020   TRIG 106 10/13/2020   CHOLHDL 3.4 10/13/2020   Lab Results  Component Value Date   VD25OH 43.2 10/13/2020   VD25OH 45.9 05/27/2020   VD25OH 45.8 02/04/2020   Lab Results  Component Value Date   WBC 12.1 (H) 08/29/2019   HGB 13.6 08/29/2019   HCT 41.3 08/29/2019   MCV 91.0 08/29/2019   PLT 211 08/29/2019   Attestation Statements:   Reviewed by clinician on day of visit: allergies, medications, problem list, medical history, surgical history, family history, social history, and previous encounter notes.  I, Insurance claims handler, CMA, am acting as Energy manager for William Hamburger, NP.  I have reviewed the above documentation for accuracy and completeness, and I agree with the above. -  Nejla Reasor d. Virna Livengood, NP-C

## 2021-01-22 ENCOUNTER — Ambulatory Visit (INDEPENDENT_AMBULATORY_CARE_PROVIDER_SITE_OTHER): Payer: BC Managed Care – PPO | Admitting: Adult Health

## 2021-01-22 ENCOUNTER — Encounter (INDEPENDENT_AMBULATORY_CARE_PROVIDER_SITE_OTHER): Payer: Self-pay | Admitting: Adult Health

## 2021-01-22 ENCOUNTER — Other Ambulatory Visit: Payer: Self-pay

## 2021-01-22 VITALS — BP 123/81 | HR 95 | Temp 98.2°F | Ht 67.0 in | Wt 215.0 lb

## 2021-01-22 DIAGNOSIS — R7303 Prediabetes: Secondary | ICD-10-CM

## 2021-01-22 DIAGNOSIS — Z6839 Body mass index (BMI) 39.0-39.9, adult: Secondary | ICD-10-CM | POA: Diagnosis not present

## 2021-01-22 DIAGNOSIS — Z9189 Other specified personal risk factors, not elsewhere classified: Secondary | ICD-10-CM

## 2021-01-22 MED ORDER — VICTOZA 18 MG/3ML ~~LOC~~ SOPN: 1.8000 mg | PEN_INJECTOR | Freq: Every day | SUBCUTANEOUS | 0 refills | Status: DC

## 2021-01-22 NOTE — Progress Notes (Signed)
Chief Complaint:   OBESITY Shelley Ryan is here to discuss her progress with her obesity treatment plan along with follow-up of her obesity related diagnoses. Shelley Ryan is on the Category 4 Plan and states she is following her eating plan approximately 80% of the time. Shelley Ryan states she is not exercising regularly at this time.  Today's visit was #: 34 Starting weight: 242 lbs Starting date: 07/02/2019 Today's weight: 215 lbs Today's date: 01/22/2021 Total lbs lost to date: 27 lbs Total lbs lost since last in-office visit: 1 lb  Interim History: On 12/22/2020, metformin was discontinued, and [name] was started on Victoza. She has titrated up to 1.2 mg daily. She denies mass in neck, dysphagia, dyspepsia, or persistent hoarseness. Neurology Update: She has been on oral steroid therapy (dose pak) to break recent migraine.   She has started Emgality injection- once every 3 months (IV therapy is a 2 hr process). She denies current HA sx's at present. She has experienced increase in appetite.  Subjective:   1. Prediabetes On 12/22/2020, metformin was discontinued, and Shelley Ryan was started on Victoza. She has titrated up to 1.2 mg daily. She denies mass in neck, dysphagia, dyspepsia, or persistent hoarseness.  2. At risk for nausea Shelley Ryan is at risk for nausea due to increasing her GLP-1 for prediabetes.  Assessment/Plan:   1. Prediabetes Refill and increase Victoza to 1.8 mg daily. Shayanna will continue to work on weight loss, exercise, and decreasing simple carbohydrates to help decrease the risk of diabetes.   - Refill and increase liraglutide (VICTOZA) 18 MG/3ML SOPN; Inject 1.8 mg into the skin daily.  Dispense: 6 mL; Refill: 0  2. At risk for nausea Shelley Ryan was given approximately 15 minutes of nausea prevention counseling today. Shelley Ryan is at risk for nausea due to her new or current medication. She was encouraged to titrate her medication slowly, make sure to stay hydrated, eat  smaller portions throughout the day, and avoid high fat meals.    3. Current BMI 33.8  Shelley Ryan is currently in the action stage of change. As such, her goal is to continue with weight loss efforts. She has agreed to the Category 4 Plan.   Handout:  Protein Content of Foods; High Protein/Low Calorie Food List.  Exercise goals: All adults should avoid inactivity. Some physical activity is better than none, and adults who participate in any amount of physical activity gain some health benefits.  Behavioral modification strategies: increasing lean protein intake, decreasing simple carbohydrates, meal planning and cooking strategies, keeping healthy foods in the home, and planning for success.  Shelley Ryan has agreed to follow-up with our clinic in 3 weeks. She was informed of the importance of frequent follow-up visits to maximize her success with intensive lifestyle modifications for her multiple health conditions.   Objective:   Blood pressure 123/81, pulse 95, temperature 98.2 F (36.8 C), height 5\' 7"  (1.702 m), weight 215 lb (97.5 kg), SpO2 99 %. Body mass index is 33.67 kg/m.  General: Cooperative, alert, well developed, in no acute distress. HEENT: Conjunctivae and lids unremarkable. Cardiovascular: Regular rhythm.  Lungs: Normal work of breathing. Neurologic: No focal deficits.   Lab Results  Component Value Date   CREATININE 0.86 10/13/2020   BUN 18 10/13/2020   NA 139 10/13/2020   K 4.4 10/13/2020   CL 103 10/13/2020   CO2 20 10/13/2020   Lab Results  Component Value Date   ALT 16 10/13/2020   AST 15 10/13/2020  ALKPHOS 80 10/13/2020   BILITOT 0.4 10/13/2020   Lab Results  Component Value Date   HGBA1C 5.7 (H) 10/13/2020   HGBA1C 5.8 (H) 05/27/2020   HGBA1C 5.9 (H) 02/04/2020   HGBA1C 5.5 10/23/2019   HGBA1C 6.1 (H) 06/19/2019   Lab Results  Component Value Date   INSULIN 10.9 10/13/2020   INSULIN 11.4 05/27/2020   INSULIN 10.9 02/04/2020   INSULIN 18.2  10/23/2019   INSULIN 13.7 07/02/2019   Lab Results  Component Value Date   TSH 3.060 07/02/2019   Lab Results  Component Value Date   CHOL 199 10/13/2020   HDL 58 10/13/2020   LDLCALC 122 (H) 10/13/2020   TRIG 106 10/13/2020   CHOLHDL 3.4 10/13/2020   Lab Results  Component Value Date   VD25OH 43.2 10/13/2020   VD25OH 45.9 05/27/2020   VD25OH 45.8 02/04/2020   Lab Results  Component Value Date   WBC 12.1 (H) 08/29/2019   HGB 13.6 08/29/2019   HCT 41.3 08/29/2019   MCV 91.0 08/29/2019   PLT 211 08/29/2019   Attestation Statements:   Reviewed by clinician on day of visit: allergies, medications, problem list, medical history, surgical history, family history, social history, and previous encounter notes.  I, Insurance claims handler, CMA, am acting as Energy manager for William Hamburger, NP.  I have reviewed the above documentation for accuracy and completeness, and I agree with the above. -  Ward Boissonneault d. Renaye Janicki, NP-C

## 2021-02-03 ENCOUNTER — Other Ambulatory Visit: Payer: Self-pay

## 2021-02-03 ENCOUNTER — Encounter (INDEPENDENT_AMBULATORY_CARE_PROVIDER_SITE_OTHER): Payer: Self-pay | Admitting: Adult Health

## 2021-02-03 ENCOUNTER — Ambulatory Visit (INDEPENDENT_AMBULATORY_CARE_PROVIDER_SITE_OTHER): Payer: BC Managed Care – PPO | Admitting: Adult Health

## 2021-02-03 VITALS — BP 138/86 | HR 95 | Temp 98.2°F | Ht 67.0 in | Wt 217.0 lb

## 2021-02-03 DIAGNOSIS — E782 Mixed hyperlipidemia: Secondary | ICD-10-CM

## 2021-02-03 DIAGNOSIS — Z9189 Other specified personal risk factors, not elsewhere classified: Secondary | ICD-10-CM | POA: Diagnosis not present

## 2021-02-03 DIAGNOSIS — Z6839 Body mass index (BMI) 39.0-39.9, adult: Secondary | ICD-10-CM | POA: Diagnosis not present

## 2021-02-03 NOTE — Progress Notes (Signed)
Chief Complaint:   OBESITY Shelley Ryan is here to discuss her progress with her obesity treatment plan along with follow-up of her obesity related diagnoses. Shelley Ryan is on the Category 4 Plan and states she is following her eating plan approximately 80% of the time. Shelley Ryan states she is walking for 30 minutes 2-3 times per week.  Today's visit was #: 35 Starting weight: 242 lbs Starting date: 07/02/2019 Today's weight: 217 lbs Today's date: 02/03/2021 Total lbs lost to date: 25 lbs Total lbs lost since last in-office visit: 0  Interim History: Shelley Ryan recently celebrated her daughter's 16th birthday!   She has a work Chartered certified accountant next week - plans on packing high protein snacks to supplement if the food provided is not a great option. She has been using the following protein supplements: [employer]ate Protein Shakes - 110 calories/20 grams of protein/0 sugar. Barebells bar - 200 calories/20 grams of protein.  Of Note- 01/06/21 seen at [facility] You were evaluated for left sided weakness, intractable headache. You had a CT venogram with no evidence of a dural venous sinus thrombosis. You had an MRI of the brain with no evidence of acute stroke. Neurology feels that you are stable for discharge. You should keep your follow-up with neurology as scheduled.   Reviewed Notes with pt today.  Subjective:   1. Mixed hyperlipidemia She is on Crestor 10 mg twice weekly - denies myalgias. Last LDL above goal of 70.  2. At risk for heart disease Shelley Ryan is at a higher than average risk for cardiovascular disease due to hyperlipidemia and prediabetes.  Assessment/Plan:   1. Mixed hyperlipidemia Check labs.  MyChart patient with results and Crestor guidance- anticipate increasing dose to achieve goal <70.  - Lipid panel - Comprehensive metabolic panel  2. At risk for heart disease Shelley Ryan was given approximately 15 minutes of coronary artery disease prevention counseling today. She is 45  y.o. female and has risk factors for heart disease including obesity. We discussed intensive lifestyle modifications today with an emphasis on specific weight loss instructions and strategies.   Repetitive spaced learning was employed today to elicit superior memory formation and behavioral change.  3. Current BMI 34.0  Shelley Ryan is currently in the action stage of change. As such, her goal is to continue with weight loss efforts. She has agreed to the Category 4 Plan.   Exercise goals:  As is.  Behavioral modification strategies: increasing lean protein intake, decreasing simple carbohydrates, meal planning and cooking strategies, keeping healthy foods in the home, and planning for success.  Shelley Ryan has agreed to follow-up with our clinic in 2-3 weeks. She was informed of the importance of frequent follow-up visits to maximize her success with intensive lifestyle modifications for her multiple health conditions.   Shelley Ryan was informed we would discuss her lab results at her next visit unless there is a critical issue that needs to be addressed sooner. Shelley Ryan agreed to keep her next visit at the agreed upon time to discuss these results.  Objective:   Blood pressure 138/86, pulse 95, temperature 98.2 F (36.8 C), height 5\' 7"  (1.702 m), weight 217 lb (98.4 kg), SpO2 99 %. Body mass index is 33.99 kg/m.  General: Cooperative, alert, well developed, in no acute distress. HEENT: Conjunctivae and lids unremarkable. Cardiovascular: Regular rhythm.  Lungs: Normal work of breathing. Neurologic: No focal deficits.   Lab Results  Component Value Date   CREATININE 0.86 10/13/2020   BUN 18 10/13/2020  NA 139 10/13/2020   K 4.4 10/13/2020   CL 103 10/13/2020   CO2 20 10/13/2020   Lab Results  Component Value Date   ALT 16 10/13/2020   AST 15 10/13/2020   ALKPHOS 80 10/13/2020   BILITOT 0.4 10/13/2020   Lab Results  Component Value Date   HGBA1C 5.7 (H) 10/13/2020   HGBA1C 5.8 (H)  05/27/2020   HGBA1C 5.9 (H) 02/04/2020   HGBA1C 5.5 10/23/2019   HGBA1C 6.1 (H) 06/19/2019   Lab Results  Component Value Date   INSULIN 10.9 10/13/2020   INSULIN 11.4 05/27/2020   INSULIN 10.9 02/04/2020   INSULIN 18.2 10/23/2019   INSULIN 13.7 07/02/2019   Lab Results  Component Value Date   TSH 3.060 07/02/2019   Lab Results  Component Value Date   CHOL 199 10/13/2020   HDL 58 10/13/2020   LDLCALC 122 (H) 10/13/2020   TRIG 106 10/13/2020   CHOLHDL 3.4 10/13/2020   Lab Results  Component Value Date   VD25OH 43.2 10/13/2020   VD25OH 45.9 05/27/2020   VD25OH 45.8 02/04/2020   Lab Results  Component Value Date   WBC 12.1 (H) 08/29/2019   HGB 13.6 08/29/2019   HCT 41.3 08/29/2019   MCV 91.0 08/29/2019   PLT 211 08/29/2019   Attestation Statements:   Reviewed by clinician on day of visit: allergies, medications, problem list, medical history, surgical history, family history, social history, and previous encounter notes.  Time spent on visit including pre-visit chart review and post-visit care and charting was 28 minutes.   I, Insurance claims handler, CMA, am acting as Energy manager for William Hamburger, NP.  I have reviewed the above documentation for accuracy and completeness, and I agree with the above. -  Argie Lober d. Arwin Bisceglia, NP-C

## 2021-02-04 ENCOUNTER — Other Ambulatory Visit (INDEPENDENT_AMBULATORY_CARE_PROVIDER_SITE_OTHER): Payer: Self-pay | Admitting: Adult Health

## 2021-02-04 ENCOUNTER — Encounter (INDEPENDENT_AMBULATORY_CARE_PROVIDER_SITE_OTHER): Payer: Self-pay | Admitting: Adult Health

## 2021-02-04 DIAGNOSIS — E782 Mixed hyperlipidemia: Secondary | ICD-10-CM

## 2021-02-04 LAB — COMPREHENSIVE METABOLIC PANEL
ALT: 19 IU/L (ref 0–32)
AST: 22 IU/L (ref 0–40)
Albumin/Globulin Ratio: 1.8 (ref 1.2–2.2)
Albumin: 4.7 g/dL (ref 3.8–4.8)
Alkaline Phosphatase: 83 IU/L (ref 44–121)
BUN/Creatinine Ratio: 19 (ref 9–23)
BUN: 16 mg/dL (ref 6–24)
Bilirubin Total: 0.5 mg/dL (ref 0.0–1.2)
CO2: 20 mmol/L (ref 20–29)
Calcium: 9.3 mg/dL (ref 8.7–10.2)
Chloride: 102 mmol/L (ref 96–106)
Creatinine, Ser: 0.85 mg/dL (ref 0.57–1.00)
Globulin, Total: 2.6 g/dL (ref 1.5–4.5)
Glucose: 84 mg/dL (ref 70–99)
Potassium: 4.2 mmol/L (ref 3.5–5.2)
Sodium: 139 mmol/L (ref 134–144)
Total Protein: 7.3 g/dL (ref 6.0–8.5)
eGFR: 85 mL/min/{1.73_m2} (ref >59)

## 2021-02-04 LAB — LIPID PANEL
Chol/HDL Ratio: 3.1 ratio (ref 0.0–4.4)
Cholesterol, Total: 159 mg/dL (ref 100–199)
HDL: 51 mg/dL (ref >39)
LDL Chol Calc (NIH): 91 mg/dL (ref 0–99)
Triglycerides: 91 mg/dL (ref 0–149)
VLDL Cholesterol Cal: 17 mg/dL (ref 5–40)

## 2021-02-04 MED ORDER — ROSUVASTATIN CALCIUM 5 MG PO TABS: ORAL_TABLET | ORAL | 0 refills | Status: DC

## 2021-02-04 NOTE — Telephone Encounter (Signed)
Last seen Katy Danford 

## 2021-02-23 ENCOUNTER — Ambulatory Visit (INDEPENDENT_AMBULATORY_CARE_PROVIDER_SITE_OTHER): Payer: BC Managed Care – PPO | Admitting: Adult Health

## 2021-02-23 ENCOUNTER — Encounter (INDEPENDENT_AMBULATORY_CARE_PROVIDER_SITE_OTHER): Payer: Self-pay | Admitting: Adult Health

## 2021-02-23 ENCOUNTER — Other Ambulatory Visit: Payer: Self-pay

## 2021-02-23 VITALS — BP 115/79 | HR 88 | Temp 98.0°F | Ht 67.0 in | Wt 219.0 lb

## 2021-02-23 DIAGNOSIS — R7303 Prediabetes: Secondary | ICD-10-CM

## 2021-02-23 DIAGNOSIS — Z6839 Body mass index (BMI) 39.0-39.9, adult: Secondary | ICD-10-CM | POA: Diagnosis not present

## 2021-02-23 DIAGNOSIS — E782 Mixed hyperlipidemia: Secondary | ICD-10-CM | POA: Diagnosis not present

## 2021-02-23 DIAGNOSIS — Z9189 Other specified personal risk factors, not elsewhere classified: Secondary | ICD-10-CM | POA: Diagnosis not present

## 2021-02-23 MED ORDER — INSULIN PEN NEEDLE 32G X 4 MM MISC: 1.0000 [IU] | Freq: Two times a day (BID) | 0 refills | Status: DC

## 2021-02-23 MED ORDER — VICTOZA 18 MG/3ML ~~LOC~~ SOPN: 1.8000 mg | PEN_INJECTOR | Freq: Every day | SUBCUTANEOUS | 0 refills | Status: DC

## 2021-02-23 NOTE — Progress Notes (Signed)
Chief Complaint:   OBESITY Shelley Ryan is here to discuss her progress with her obesity treatment plan along with follow-up of her obesity related diagnoses. Shelley Ryan is on the Category 4 Plan and states she is following her eating plan approximately 50% of the time. Shelley Ryan states she is walking for 20 minutes 5 times per week.  Today's visit was #: 36 Starting weight: 242 lbs Starting date: 07/02/2019 Today's weight: 219 lbs Today's date: 02/23/2021 Total lbs lost to date: 23 lbs Total lbs lost since last in-office visit: 0  Interim History: 02/04/21 Labs- LDL did decrease, however is still above goal of 70. Please increase Crestor daily, however I am changing dose from 10mg  to 5mg . So instead of two weekly doses of 10mg  (total of 20mg  per week), you will now be taking 5mg  daily (35mg  per week with a net increase of 15mg  per week). We will re-check CMP and lipids in 6 weeks She estimates to walk 5,000-8,000 steps per day. On 01/06/2021, labs from [facility] - A1c 5.7, TSH 3.7.  Subjective:   1. Mixed hyperlipidemia LDL did decrease, however is still above goal of 70. Please increase Crestor daily, however I am changing dose from 10mg  to 5mg . So instead of two weekly doses of 10mg  (total of 20mg  per week), you will now be taking 5mg  daily (35mg  per week with a net increase of 15mg  per week). We will re-check CMP and lipids in 6 weeks  2. Prediabetes She is on Victoza 1.8 mg daily.  She denies mass in neck, dysphagia, dyspepsia, or persistent hoarseness.  3. At risk for heart disease Shelley Ryan is at a higher than average risk for cardiovascular disease due to hyperlipidemia, prediabetes, and obesity.    Assessment/Plan:   1. Mixed hyperlipidemia Check labs at next office visit.  2. Prediabetes Refill Victoza 1.8 mg daily, as per below.  - Refill Insulin Pen Needle 32G X 4 MM MISC; 1 Units by Does not apply route in the morning and at bedtime.  Dispense: 100 each; Refill:  0 - Refill liraglutide (VICTOZA) 18 MG/3ML SOPN; Inject 1.8 mg into the skin daily.  Dispense: 6 mL; Refill: 0  3. At risk for heart disease Shelley Ryan was given approximately 15 minutes of coronary artery disease prevention counseling today. She is 45 y.o. female and has risk factors for heart disease including obesity. We discussed intensive lifestyle modifications today with an emphasis on specific weight loss instructions and strategies.   Repetitive spaced learning was employed today to elicit superior memory formation and behavioral change.   4. Current BMI 34.9  Shelley Ryan is currently in the action stage of change. As such, her goal is to continue with weight loss efforts. She has agreed to the Category 4 Plan.   Check waist circumference at next office visit.    Check fasting labs at next office visit.  Exercise goals:  As is.  Behavioral modification strategies: increasing lean protein intake, decreasing simple carbohydrates, meal planning and cooking strategies, keeping healthy foods in the home, and planning for success.  Shelley Ryan has agreed to follow-up with our clinic in 2-3 weeks, fasting. She was informed of the importance of frequent follow-up visits to maximize her success with intensive lifestyle modifications for her multiple health conditions.   Objective:   Blood pressure 115/79, pulse 88, temperature 98 F (36.7 C), height 5\' 7"  (1.702 m), weight 219 lb (99.3 kg), SpO2 98 %. Body mass index is 34.3 kg/m.  General: Cooperative,  alert, well developed, in no acute distress. HEENT: Conjunctivae and lids unremarkable. Cardiovascular: Regular rhythm.  Lungs: Normal work of breathing. Neurologic: No focal deficits.   Lab Results  Component Value Date   CREATININE 0.85 02/03/2021   BUN 16 02/03/2021   NA 139 02/03/2021   K 4.2 02/03/2021   CL 102 02/03/2021   CO2 20 02/03/2021   Lab Results  Component Value Date   ALT 19 02/03/2021   AST 22 02/03/2021   ALKPHOS 83  02/03/2021   BILITOT 0.5 02/03/2021   Lab Results  Component Value Date   HGBA1C 5.7 (H) 10/13/2020   HGBA1C 5.8 (H) 05/27/2020   HGBA1C 5.9 (H) 02/04/2020   HGBA1C 5.5 10/23/2019   HGBA1C 6.1 (H) 06/19/2019   Lab Results  Component Value Date   INSULIN 10.9 10/13/2020   INSULIN 11.4 05/27/2020   INSULIN 10.9 02/04/2020   INSULIN 18.2 10/23/2019   INSULIN 13.7 07/02/2019   Lab Results  Component Value Date   TSH 3.060 07/02/2019   Lab Results  Component Value Date   CHOL 159 02/03/2021   HDL 51 02/03/2021   LDLCALC 91 02/03/2021   TRIG 91 02/03/2021   CHOLHDL 3.1 02/03/2021   Lab Results  Component Value Date   VD25OH 43.2 10/13/2020   VD25OH 45.9 05/27/2020   VD25OH 45.8 02/04/2020   Lab Results  Component Value Date   WBC 12.1 (H) 08/29/2019   HGB 13.6 08/29/2019   HCT 41.3 08/29/2019   MCV 91.0 08/29/2019   PLT 211 08/29/2019   Attestation Statements:   Reviewed by clinician on day of visit: allergies, medications, problem list, medical history, surgical history, family history, social history, and previous encounter notes.  I, Insurance claims handler, CMA, am acting as Energy manager for William Hamburger, NP.  I have reviewed the above documentation for accuracy and completeness, and I agree with the above. -  Lorisa Scheid d. Yavier Snider., NP-C

## 2021-03-23 ENCOUNTER — Encounter (INDEPENDENT_AMBULATORY_CARE_PROVIDER_SITE_OTHER): Payer: Self-pay | Admitting: Adult Health

## 2021-03-23 ENCOUNTER — Other Ambulatory Visit: Payer: Self-pay

## 2021-03-23 ENCOUNTER — Ambulatory Visit (INDEPENDENT_AMBULATORY_CARE_PROVIDER_SITE_OTHER): Payer: BC Managed Care – PPO | Admitting: Adult Health

## 2021-03-23 VITALS — BP 117/75 | HR 94 | Temp 98.1°F | Ht 67.0 in | Wt 219.0 lb

## 2021-03-23 DIAGNOSIS — E782 Mixed hyperlipidemia: Secondary | ICD-10-CM

## 2021-03-23 DIAGNOSIS — E66812 Obesity, class 2: Secondary | ICD-10-CM

## 2021-03-23 DIAGNOSIS — E559 Vitamin D deficiency, unspecified: Secondary | ICD-10-CM | POA: Diagnosis not present

## 2021-03-23 DIAGNOSIS — R7303 Prediabetes: Secondary | ICD-10-CM

## 2021-03-23 DIAGNOSIS — Z9189 Other specified personal risk factors, not elsewhere classified: Secondary | ICD-10-CM

## 2021-03-23 DIAGNOSIS — Z6839 Body mass index (BMI) 39.0-39.9, adult: Secondary | ICD-10-CM

## 2021-03-23 MED ORDER — VICTOZA 18 MG/3ML ~~LOC~~ SOPN: 1.8000 mg | PEN_INJECTOR | Freq: Every day | SUBCUTANEOUS | 0 refills | Status: DC

## 2021-03-23 NOTE — Progress Notes (Signed)
Chief Complaint:   OBESITY Shelley Ryan is here to discuss her progress with her obesity treatment plan along with follow-up of her obesity related diagnoses. Shelley Ryan is on the Category 4 Plan and states she is following her eating plan approximately 80% of the time. Shelley Ryan states she is walking for 30 minutes 4 times per week.  Today's visit was #: 37 Starting weight: 242 lbs Starting date: 07/02/2019 Today's weight: 219 lbs Today's date: 03/23/2021 Total lbs lost to date: 23 lbs Total lbs lost since last in-office visit: 0  Interim History: Scheryl recently had COVID-19 infection - 03/09/2021 - home antigen COVID-19 positive (body aches). She denies any lingering COVID-19 symptoms.  Of note:  5 migraines per week/[name]/3 migraines per month.  Subjective:   1. Prediabetes She is on Victoza 1.8 mg daily.   She denies mass in neck, dysphagia, dyspepsia, or persistent hoarseness.  2. Mixed hyperlipidemia Previously on Crestor two weekly doses of 10mg  (total of 20mg  per week), you will now be taking 5mg  daily (35mg  per week with a net increase of 15mg  per week). This change was in response to 02/03/21 Lipid panel, LDL >70/ She is on Crestor 5 mg daily, denies myalgia's.  3. Vitamin D deficiency Vitamin D level on 10/13/2020 - 43.2.  4. At risk for osteoporosis Farha is at higher risk of osteopenia and osteoporosis due to Vitamin D deficiency.   Assessment/Plan:   1. Prediabetes Check labs today. Refill Victoza 1.8 mg daily.  - Refill liraglutide (VICTOZA) 18 MG/3ML SOPN; Inject 1.8 mg into the skin daily.  Dispense: 6 mL; Refill: 0 - Hemoglobin A1c - Insulin, random  2. Mixed hyperlipidemia Check labs today.  Continue on Crestor 5 mg daily.  Remain well hydrated.  - Comprehensive metabolic panel - Lipid panel  3. Vitamin D deficiency Check vitamin D level today. Continue ergocalciferol.  No need for refill.  - VITAMIN D 25 Hydroxy (Vit-D Deficiency, Fractures)  4. At  risk for osteoporosis Jalicia was given approximately 15 minutes of osteoporosis prevention counseling today. Lilianne is at risk for osteopenia and osteoporosis due to her Vitamin D deficiency. She was encouraged to take her Vitamin D and follow her higher calcium diet and increase strengthening exercise to help strengthen her bones and decrease her risk of osteopenia and osteoporosis.  Repetitive spaced learning was employed today to elicit superior memory formation and behavioral change.  5. Current BMI 34.3  Cathyann is currently in the action stage of change. As such, her goal is to continue with weight loss efforts. She has agreed to the Category 4 Plan.   Exercise goals:  Increase intensity of walking.  Behavioral modification strategies: increasing lean protein intake, decreasing simple carbohydrates, meal planning and cooking strategies, keeping healthy foods in the home, and planning for success.  Oleta has agreed to follow-up with our clinic in 2-3 weeks. She was informed of the importance of frequent follow-up visits to maximize her success with intensive lifestyle modifications for her multiple health conditions.   Karah was informed we would discuss her lab results at her next visit unless there is a critical issue that needs to be addressed sooner. Tyne agreed to keep her next visit at the agreed upon time to discuss these results.  Objective:   Blood pressure 117/75, pulse 94, temperature 98.1 F (36.7 C), height 5\' 7"  (1.702 m), weight 219 lb (99.3 kg), SpO2 97 %. Body mass index is 34.3 kg/m.  General: Cooperative, alert, well developed, in  no acute distress. HEENT: Conjunctivae and lids unremarkable. Cardiovascular: Regular rhythm.  Lungs: Normal work of breathing. Neurologic: No focal deficits.   Lab Results  Component Value Date   CREATININE 0.85 02/03/2021   BUN 16 02/03/2021   NA 139 02/03/2021   K 4.2 02/03/2021   CL 102 02/03/2021   CO2 20 02/03/2021   Lab  Results  Component Value Date   ALT 19 02/03/2021   AST 22 02/03/2021   ALKPHOS 83 02/03/2021   BILITOT 0.5 02/03/2021   Lab Results  Component Value Date   HGBA1C 5.7 (H) 10/13/2020   HGBA1C 5.8 (H) 05/27/2020   HGBA1C 5.9 (H) 02/04/2020   HGBA1C 5.5 10/23/2019   HGBA1C 6.1 (H) 06/19/2019   Lab Results  Component Value Date   INSULIN 10.9 10/13/2020   INSULIN 11.4 05/27/2020   INSULIN 10.9 02/04/2020   INSULIN 18.2 10/23/2019   INSULIN 13.7 07/02/2019   Lab Results  Component Value Date   TSH 3.060 07/02/2019   Lab Results  Component Value Date   CHOL 159 02/03/2021   HDL 51 02/03/2021   LDLCALC 91 02/03/2021   TRIG 91 02/03/2021   CHOLHDL 3.1 02/03/2021   Lab Results  Component Value Date   VD25OH 43.2 10/13/2020   VD25OH 45.9 05/27/2020   VD25OH 45.8 02/04/2020   Lab Results  Component Value Date   WBC 12.1 (H) 08/29/2019   HGB 13.6 08/29/2019   HCT 41.3 08/29/2019   MCV 91.0 08/29/2019   PLT 211 08/29/2019   Attestation Statements:   Reviewed by clinician on day of visit: allergies, medications, problem list, medical history, surgical history, family history, social history, and previous encounter notes.  I, Insurance claims handler, CMA, am acting as Energy manager for William Hamburger, NP.  I have reviewed the above documentation for accuracy and completeness, and I agree with the above. - Veronique Warga d. Shakelia Scrivner, NP-C

## 2021-03-24 ENCOUNTER — Encounter (INDEPENDENT_AMBULATORY_CARE_PROVIDER_SITE_OTHER): Payer: Self-pay | Admitting: Adult Health

## 2021-03-24 LAB — COMPREHENSIVE METABOLIC PANEL
ALT: 18 IU/L (ref 0–32)
AST: 14 IU/L (ref 0–40)
Albumin/Globulin Ratio: 1.9 (ref 1.2–2.2)
Albumin: 4.6 g/dL (ref 3.8–4.8)
Alkaline Phosphatase: 74 IU/L (ref 44–121)
BUN/Creatinine Ratio: 20 (ref 9–23)
BUN: 18 mg/dL (ref 6–24)
Bilirubin Total: 0.5 mg/dL (ref 0.0–1.2)
CO2: 18 mmol/L — ABNORMAL LOW (ref 20–29)
Calcium: 9.3 mg/dL (ref 8.7–10.2)
Chloride: 106 mmol/L (ref 96–106)
Creatinine, Ser: 0.91 mg/dL (ref 0.57–1.00)
Globulin, Total: 2.4 g/dL (ref 1.5–4.5)
Glucose: 94 mg/dL (ref 70–99)
Potassium: 4.2 mmol/L (ref 3.5–5.2)
Sodium: 141 mmol/L (ref 134–144)
Total Protein: 7 g/dL (ref 6.0–8.5)
eGFR: 78 mL/min/{1.73_m2} (ref >59)

## 2021-03-24 LAB — HEMOGLOBIN A1C
Est. average glucose Bld gHb Est-mCnc: 120 mg/dL
Hgb A1c MFr Bld: 5.8 % — ABNORMAL HIGH (ref 4.8–5.6)

## 2021-03-24 LAB — VITAMIN D 25 HYDROXY (VIT D DEFICIENCY, FRACTURES): Vit D, 25-Hydroxy: 56.7 ng/mL (ref 30.0–100.0)

## 2021-03-24 LAB — LIPID PANEL
Chol/HDL Ratio: 2.8 ratio (ref 0.0–4.4)
Cholesterol, Total: 132 mg/dL (ref 100–199)
HDL: 48 mg/dL (ref >39)
LDL Chol Calc (NIH): 66 mg/dL (ref 0–99)
Triglycerides: 94 mg/dL (ref 0–149)
VLDL Cholesterol Cal: 18 mg/dL (ref 5–40)

## 2021-03-24 LAB — INSULIN, RANDOM: INSULIN: 17.5 u[IU]/mL (ref 2.6–24.9)

## 2021-04-06 ENCOUNTER — Other Ambulatory Visit (INDEPENDENT_AMBULATORY_CARE_PROVIDER_SITE_OTHER): Payer: Self-pay | Admitting: Adult Health

## 2021-04-06 ENCOUNTER — Ambulatory Visit (INDEPENDENT_AMBULATORY_CARE_PROVIDER_SITE_OTHER): Payer: BC Managed Care – PPO | Admitting: Adult Health

## 2021-04-06 ENCOUNTER — Encounter (INDEPENDENT_AMBULATORY_CARE_PROVIDER_SITE_OTHER): Payer: Self-pay | Admitting: Adult Health

## 2021-04-06 ENCOUNTER — Other Ambulatory Visit: Payer: Self-pay

## 2021-04-06 VITALS — BP 121/74 | HR 86 | Temp 98.1°F | Ht 67.0 in | Wt 223.0 lb

## 2021-04-06 DIAGNOSIS — E559 Vitamin D deficiency, unspecified: Secondary | ICD-10-CM | POA: Diagnosis not present

## 2021-04-06 DIAGNOSIS — Z9189 Other specified personal risk factors, not elsewhere classified: Secondary | ICD-10-CM

## 2021-04-06 DIAGNOSIS — R7303 Prediabetes: Secondary | ICD-10-CM

## 2021-04-06 DIAGNOSIS — Z6839 Body mass index (BMI) 39.0-39.9, adult: Secondary | ICD-10-CM

## 2021-04-06 DIAGNOSIS — E782 Mixed hyperlipidemia: Secondary | ICD-10-CM | POA: Diagnosis not present

## 2021-04-06 MED ORDER — VITAMIN D (ERGOCALCIFEROL) 1.25 MG (50000 UNIT) PO CAPS: 50000.0000 [IU] | ORAL_CAPSULE | ORAL | 0 refills | Status: DC

## 2021-04-06 MED ORDER — ROSUVASTATIN CALCIUM 5 MG PO TABS
ORAL_TABLET | ORAL | 0 refills | Status: DC
Start: 2021-04-06 — End: 2021-07-16

## 2021-04-06 MED ORDER — VICTOZA 18 MG/3ML ~~LOC~~ SOPN: 1.8000 mg | PEN_INJECTOR | Freq: Every day | SUBCUTANEOUS | 0 refills | Status: DC

## 2021-04-06 MED ORDER — METFORMIN HCL 500 MG PO TABS: ORAL_TABLET | ORAL | 0 refills | Status: DC

## 2021-04-06 NOTE — Telephone Encounter (Signed)
Katy ?

## 2021-04-06 NOTE — Progress Notes (Signed)
Chief Complaint:   OBESITY Shelley Ryan is here to discuss her progress with her obesity treatment plan along with follow-up of her obesity related diagnoses. Shelley Ryan is on the Category 4 Plan and states she is following her eating plan approximately 50% of the time. Shelley Ryan states she is not exercising regularly.  Today's visit was #: 38 Starting weight: 242 lbs Starting date: 07/02/2019 Today's weight: 223 lbs Today's date: 04/06/2021 Total lbs lost to date: 19 lbs Total lbs lost since last in-office visit: 0  Interim History:  "Every time I turn around it's a holiday party." Semester will wrap up this Friday. When she walks, she will achieve HR between 65-70% max HR for her age.  Subjective:   1. Mixed hyperlipidemia Discussed labs with patient today.  On 03/23/2021, total 132, TG 94, HDL 48, LDL 66. LDL at goal. On 03/23/2021, CMP - LFTs - WNL. She is on Rosuvastatin 5mg  QD- denies myalgia's.  2. Prediabetes Worsening.  Discussed labs with patient today.  On 03/23/2021, BG 94, A1c 5.8, insulin 17.5. Worsening A1c and insulin level - polyphagia in late afternoon. On 03/23/2021, CMP showed GFR > 65  3. Vitamin D deficiency Discussed labs with patient today.  On 03/23/2021, vitamin D level - 56.7 - stable. She is currently taking prescription ergocalciferol 50,000 IU each week. She denies nausea, vomiting or muscle weakness.  4. At risk for diarrhea Shelley Ryan is at risk for diarrhea due to starting metformin for worsening A1c/insulin levels.  Assessment/Plan:   1. Mixed hyperlipidemia Refill rosuvastatin 5 mg daily, as per below.  - Refill rosuvastatin (CRESTOR) 5 MG tablet; One tablet by mouth daily  Dispense: 90 tablet; Refill: 0  2. Prediabetes Refill Victoza 1.8 mg daily. Start metformin 500 mg 1 tablet at lunch, dispense 30 tablets, 0 refills..  - Refill liraglutide (VICTOZA) 18 MG/3ML SOPN; Inject 1.8 mg into the skin daily.  Dispense: 9 mL; Refill: 0  3. Vitamin D  deficiency Refill ergocalciferol 50,000 IU once weekly.  - Refill Vitamin D, Ergocalciferol, (DRISDOL) 1.25 MG (50000 UNIT) CAPS capsule; Take 1 capsule (50,000 Units total) by mouth every 7 (seven) days.  Dispense: 12 capsule; Refill: 0  4. At risk for diarrhea Shelley Ryan was given approximately 15 minutes of diarrhea prevention counseling today. She is 45 y.o. female and has risk factors for diarrhea including medications and changes in diet. We discussed intensive lifestyle modifications today with an emphasis on specific weight loss instructions including dietary strategies.   Repetitive spaced learning was employed today to elicit superior memory formation and behavioral change.   5. Current BMI 34.9  Shelley Ryan is currently in the action stage of change. As such, her goal is to continue with weight loss efforts. She has agreed to the Category 4 Plan.   Exercise goals:  As is.  Behavioral modification strategies: increasing lean protein intake, decreasing simple carbohydrates, meal planning and cooking strategies, keeping healthy foods in the home, and planning for success.  Shelley Ryan has agreed to follow-up with our clinic in 4 weeks. She was informed of the importance of frequent follow-up visits to maximize her success with intensive lifestyle modifications for her multiple health conditions.   Shelley Ryan was informed we would discuss her lab results at her next visit unless there is a critical issue that needs to be addressed sooner. Shelley Ryan agreed to keep her next visit at the agreed upon time to discuss these results.  Objective:   Blood pressure 121/74, pulse 86, temperature 98.1  F (36.7 C), height 5\' 7"  (1.702 m), weight 223 lb (101.2 kg), SpO2 98 %. Body mass index is 34.93 kg/m.  General: Cooperative, alert, well developed, in no acute distress. HEENT: Conjunctivae and lids unremarkable. Cardiovascular: Regular rhythm.  Lungs: Normal work of breathing. Neurologic: No focal deficits.    Lab Results  Component Value Date   CREATININE 0.91 03/23/2021   BUN 18 03/23/2021   NA 141 03/23/2021   K 4.2 03/23/2021   CL 106 03/23/2021   CO2 18 (L) 03/23/2021   Lab Results  Component Value Date   ALT 18 03/23/2021   AST 14 03/23/2021   ALKPHOS 74 03/23/2021   BILITOT 0.5 03/23/2021   Lab Results  Component Value Date   HGBA1C 5.8 (H) 03/23/2021   HGBA1C 5.7 (H) 10/13/2020   HGBA1C 5.8 (H) 05/27/2020   HGBA1C 5.9 (H) 02/04/2020   HGBA1C 5.5 10/23/2019   Lab Results  Component Value Date   INSULIN 17.5 03/23/2021   INSULIN 10.9 10/13/2020   INSULIN 11.4 05/27/2020   INSULIN 10.9 02/04/2020   INSULIN 18.2 10/23/2019   Lab Results  Component Value Date   TSH 3.060 07/02/2019   Lab Results  Component Value Date   CHOL 132 03/23/2021   HDL 48 03/23/2021   LDLCALC 66 03/23/2021   TRIG 94 03/23/2021   CHOLHDL 2.8 03/23/2021   Lab Results  Component Value Date   VD25OH 56.7 03/23/2021   VD25OH 43.2 10/13/2020   VD25OH 45.9 05/27/2020   Lab Results  Component Value Date   WBC 12.1 (H) 08/29/2019   HGB 13.6 08/29/2019   HCT 41.3 08/29/2019   MCV 91.0 08/29/2019   PLT 211 08/29/2019   Attestation Statements:   Reviewed by clinician on day of visit: allergies, medications, problem list, medical history, surgical history, family history, social history, and previous encounter notes.  I, 10/29/2019, CMA, am acting as Insurance claims handler for Energy manager, NP.  I have reviewed the above documentation for accuracy and completeness, and I agree with the above. -  Devinn Voshell d. Anasofia Micallef, NP-C

## 2021-04-28 ENCOUNTER — Ambulatory Visit (INDEPENDENT_AMBULATORY_CARE_PROVIDER_SITE_OTHER): Payer: BC Managed Care – PPO | Admitting: Adult Health

## 2021-05-06 ENCOUNTER — Other Ambulatory Visit: Payer: Self-pay

## 2021-05-06 ENCOUNTER — Ambulatory Visit (INDEPENDENT_AMBULATORY_CARE_PROVIDER_SITE_OTHER): Payer: BC Managed Care – PPO | Admitting: Adult Health

## 2021-05-06 ENCOUNTER — Encounter (INDEPENDENT_AMBULATORY_CARE_PROVIDER_SITE_OTHER): Payer: Self-pay | Admitting: Adult Health

## 2021-05-06 VITALS — BP 135/83 | HR 104 | Temp 98.0°F | Ht 67.0 in | Wt 218.0 lb

## 2021-05-06 DIAGNOSIS — Z9189 Other specified personal risk factors, not elsewhere classified: Secondary | ICD-10-CM | POA: Diagnosis not present

## 2021-05-06 DIAGNOSIS — Z6834 Body mass index (BMI) 34.0-34.9, adult: Secondary | ICD-10-CM | POA: Diagnosis not present

## 2021-05-06 DIAGNOSIS — R7303 Prediabetes: Secondary | ICD-10-CM | POA: Diagnosis not present

## 2021-05-06 MED ORDER — VICTOZA 18 MG/3ML ~~LOC~~ SOPN: 1.8000 mg | PEN_INJECTOR | Freq: Every day | SUBCUTANEOUS | 0 refills | Status: DC

## 2021-05-06 NOTE — Progress Notes (Signed)
Chief Complaint:   OBESITY Shelley Ryan is here to discuss her progress with her obesity treatment plan along with follow-up of her obesity related diagnoses. Shelley Ryan is on the Category 4 Plan and states she is following her eating plan approximately 80% of the time. Shelley Ryan states she is not exercising regularly at this time.  Today's visit was #: 65 Starting weight: 242 lbs Starting date: 07/02/2019 Today's weight: 218 lbs Today's date: 05/06/2021 Total lbs lost to date: 24 lbs Total lbs lost since last in-office visit: 5 lbs  Interim History:  We reviewed bioimpedance -  MM 125 lbs, FM 86 lbs.   MM > FM,  125 lbs > 86 lbs. Visceral adipose rating at goal - 10.  Shelley Ryan's 2023 goals are a weight of 175 pounds and BMI of 27.4.  Subjective:   1. Prediabetes A1c worsening despite max dose Victoza 1.8 mg. On 04/06/2021, started metformin 500 mg daily - she is tolerating well.  2. At risk for diabetes mellitus Shelley Ryan is at higher than average risk for developing diabetes due to worsening A1c.    Assessment/Plan:   1. Prediabetes Continue metformin. Refill Victoza 1.8 mg daily.  - Refill liraglutide (VICTOZA) 18 MG/3ML SOPN; Inject 1.8 mg into the skin daily.  Dispense: 9 mL; Refill: 0  2. At risk for diabetes mellitus Shelley Ryan was given approximately 15 minutes of diabetes education and counseling today. We discussed intensive lifestyle modifications today with an emphasis on weight loss as well as increasing exercise and decreasing simple carbohydrates in her diet. We also reviewed medication options with an emphasis on risk versus benefit of those discussed.   Repetitive spaced learning was employed today to elicit superior memory formation and behavioral change.  3. Current BMI 34.3  Shelley Ryan is currently in the action stage of change. As such, her goal is to continue with weight loss efforts. She has agreed to the Category 4 Plan.   Exercise goals:  Increase daily  walking.  Behavioral modification strategies: increasing lean protein intake, decreasing simple carbohydrates, meal planning and cooking strategies, keeping healthy foods in the home, better snacking choices, and planning for success.  Shelley Ryan has agreed to follow-up with our clinic in 3 weeks. She was informed of the importance of frequent follow-up visits to maximize her success with intensive lifestyle modifications for her multiple health conditions.   Objective:   Blood pressure 135/83, pulse (!) 104, temperature 98 F (36.7 C), height 5\' 7"  (1.702 m), weight 218 lb (98.9 kg), SpO2 99 %. Body mass index is 34.14 kg/m.  General: Cooperative, alert, well developed, in no acute distress. HEENT: Conjunctivae and lids unremarkable. Cardiovascular: Regular rhythm.  Lungs: Normal work of breathing. Neurologic: No focal deficits.   Lab Results  Component Value Date   CREATININE 0.91 03/23/2021   BUN 18 03/23/2021   NA 141 03/23/2021   K 4.2 03/23/2021   CL 106 03/23/2021   CO2 18 (L) 03/23/2021   Lab Results  Component Value Date   ALT 18 03/23/2021   AST 14 03/23/2021   ALKPHOS 74 03/23/2021   BILITOT 0.5 03/23/2021   Lab Results  Component Value Date   HGBA1C 5.8 (H) 03/23/2021   HGBA1C 5.7 (H) 10/13/2020   HGBA1C 5.8 (H) 05/27/2020   HGBA1C 5.9 (H) 02/04/2020   HGBA1C 5.5 10/23/2019   Lab Results  Component Value Date   INSULIN 17.5 03/23/2021   INSULIN 10.9 10/13/2020   INSULIN 11.4 05/27/2020   INSULIN 10.9 02/04/2020  INSULIN 18.2 10/23/2019   Lab Results  Component Value Date   TSH 3.060 07/02/2019   Lab Results  Component Value Date   CHOL 132 03/23/2021   HDL 48 03/23/2021   LDLCALC 66 03/23/2021   TRIG 94 03/23/2021   CHOLHDL 2.8 03/23/2021   Lab Results  Component Value Date   VD25OH 56.7 03/23/2021   VD25OH 43.2 10/13/2020   VD25OH 45.9 05/27/2020   Lab Results  Component Value Date   WBC 12.1 (H) 08/29/2019   HGB 13.6 08/29/2019   HCT  41.3 08/29/2019   MCV 91.0 08/29/2019   PLT 211 08/29/2019   Attestation Statements:   Reviewed by clinician on day of visit: allergies, medications, problem list, medical history, surgical history, family history, social history, and previous encounter notes.  I, Water quality scientist, CMA, am acting as Location manager for Mina Marble, NP.  I have reviewed the above documentation for accuracy and completeness, and I agree with the above. -  Bastion Bolger d. Dahlia Nifong, NP-C

## 2021-05-26 ENCOUNTER — Ambulatory Visit (INDEPENDENT_AMBULATORY_CARE_PROVIDER_SITE_OTHER): Payer: BC Managed Care – PPO | Admitting: Adult Health

## 2021-05-26 ENCOUNTER — Other Ambulatory Visit (INDEPENDENT_AMBULATORY_CARE_PROVIDER_SITE_OTHER): Payer: Self-pay | Admitting: Adult Health

## 2021-05-26 ENCOUNTER — Encounter (INDEPENDENT_AMBULATORY_CARE_PROVIDER_SITE_OTHER): Payer: Self-pay | Admitting: Adult Health

## 2021-05-26 ENCOUNTER — Other Ambulatory Visit: Payer: Self-pay

## 2021-05-26 VITALS — BP 133/81 | HR 97 | Temp 98.1°F | Ht 67.0 in | Wt 224.0 lb

## 2021-05-26 DIAGNOSIS — R0602 Shortness of breath: Secondary | ICD-10-CM | POA: Diagnosis not present

## 2021-05-26 DIAGNOSIS — Z9189 Other specified personal risk factors, not elsewhere classified: Secondary | ICD-10-CM

## 2021-05-26 DIAGNOSIS — E669 Obesity, unspecified: Secondary | ICD-10-CM | POA: Diagnosis not present

## 2021-05-26 DIAGNOSIS — R7303 Prediabetes: Secondary | ICD-10-CM | POA: Diagnosis not present

## 2021-05-26 DIAGNOSIS — Z6835 Body mass index (BMI) 35.0-35.9, adult: Secondary | ICD-10-CM | POA: Diagnosis not present

## 2021-05-26 MED ORDER — SEMAGLUTIDE (1 MG/DOSE) 4 MG/3ML ~~LOC~~ SOPN
1.0000 mg | PEN_INJECTOR | SUBCUTANEOUS | 0 refills | Status: DC
Start: 2021-05-26 — End: 2021-06-25

## 2021-05-26 MED ORDER — METFORMIN HCL 500 MG PO TABS: ORAL_TABLET | ORAL | 0 refills | Status: DC

## 2021-05-26 NOTE — Progress Notes (Signed)
Chief Complaint:   OBESITY Shelley Ryan is here to discuss her progress with her obesity treatment plan along with follow-up of her obesity related diagnoses. Shelley Ryan is on the Category 4 Plan and states she is following her eating plan approximately 90% of the time. Shelley Ryan states she is walking for 30 minutes 2 times per week.  Today's visit was #: 40 Starting weight: 242 lbs Starting date: 07/02/2019 Today's weight: 224 lbs Today's date: 05/26/2021 Total lbs lost to date: 18 lbs Total lbs lost since last in-office visit: 0  Interim History:  Shelley Ryan says the pharmacy did not refill Victoza - she has been without GLP-1 for >5 days.   Subjective:   1. Prediabetes Experiencing polyphagia and worsening A1c/insulin levels on max dose of Victoza. She denies family history of MTC or personal history of pancreatitis.  2. SOB (shortness of breath) She reports dyspnea with extreme exertion, denies chest pain.  3. At risk for diabetes mellitus Shelley Ryan is at higher than average risk for developing diabetes due to worsening A1c/insulin.   Assessment/Plan:   1. Prediabetes Discontinue Victoza 1.8. Start Ozempic 1 mg once weekly. Refill metformin 500 mg daily.  - Start Semaglutide, 1 MG/DOSE, 4 MG/3ML SOPN; Inject 1 mg as directed once a week.  Dispense: 3 mL; Refill: 0 - Refill metFORMIN (GLUCOPHAGE) 500 MG tablet; 1 tab at lunch  Dispense: 30 tablet; Refill: 0  2. SOB (shortness of breath) Check IC at next office visit.  3. At risk for diabetes mellitus Shelley Ryan was given approximately 15 minutes of diabetic education and counseling today. We discussed intensive lifestyle modifications today with an emphasis on weight loss as well as increasing exercise and decreasing simple carbohydrates in her diet. We also reviewed medication options with an emphasis on risk versus benefits of those discussed.  Repetitive spaced learning was employed today to elicit superior memory formation and behavioral  change.  4. Current BMI 35.2  Shelley Ryan is currently in the action stage of change. As such, her goal is to continue with weight loss efforts. She has agreed to the Category 4 Plan.   Check IC at next office visit.  Exercise goals:  As is.  Behavioral modification strategies: increasing lean protein intake, decreasing simple carbohydrates, meal planning and cooking strategies, keeping healthy foods in the home, and planning for success.  Shelley Ryan has agreed to follow-up with our clinic in 2-3 weeks, fasting/IC. She was informed of the importance of frequent follow-up visits to maximize her success with intensive lifestyle modifications for her multiple health conditions.   Objective:   Blood pressure 133/81, pulse 97, temperature 98.1 F (36.7 C), height 5\' 7"  (1.702 m), weight 224 lb (101.6 kg), SpO2 99 %. Body mass index is 35.08 kg/m.  General: Cooperative, alert, well developed, in no acute distress. HEENT: Conjunctivae and lids unremarkable. Cardiovascular: Regular rhythm.  Lungs: Normal work of breathing. Neurologic: No focal deficits.   Lab Results  Component Value Date   CREATININE 0.91 03/23/2021   BUN 18 03/23/2021   NA 141 03/23/2021   K 4.2 03/23/2021   CL 106 03/23/2021   CO2 18 (L) 03/23/2021   Lab Results  Component Value Date   ALT 18 03/23/2021   AST 14 03/23/2021   ALKPHOS 74 03/23/2021   BILITOT 0.5 03/23/2021   Lab Results  Component Value Date   HGBA1C 5.8 (H) 03/23/2021   HGBA1C 5.7 (H) 10/13/2020   HGBA1C 5.8 (H) 05/27/2020   HGBA1C 5.9 (H) 02/04/2020  HGBA1C 5.5 10/23/2019   Lab Results  Component Value Date   INSULIN 17.5 03/23/2021   INSULIN 10.9 10/13/2020   INSULIN 11.4 05/27/2020   INSULIN 10.9 02/04/2020   INSULIN 18.2 10/23/2019   Lab Results  Component Value Date   TSH 3.060 07/02/2019   Lab Results  Component Value Date   CHOL 132 03/23/2021   HDL 48 03/23/2021   LDLCALC 66 03/23/2021   TRIG 94 03/23/2021   CHOLHDL 2.8  03/23/2021   Lab Results  Component Value Date   VD25OH 56.7 03/23/2021   VD25OH 43.2 10/13/2020   VD25OH 45.9 05/27/2020   Lab Results  Component Value Date   WBC 12.1 (H) 08/29/2019   HGB 13.6 08/29/2019   HCT 41.3 08/29/2019   MCV 91.0 08/29/2019   PLT 211 08/29/2019   Attestation Statements:   Reviewed by clinician on day of visit: allergies, medications, problem list, medical history, surgical history, family history, social history, and previous encounter notes.  I, Insurance claims handler, CMA, am acting as Energy manager for William Hamburger, NP.  I have reviewed the above documentation for accuracy and completeness, and I agree with the above. - Danylah Holden d. Xyler Terpening, NP-C

## 2021-06-25 ENCOUNTER — Ambulatory Visit (INDEPENDENT_AMBULATORY_CARE_PROVIDER_SITE_OTHER): Payer: BC Managed Care – PPO | Admitting: Adult Health

## 2021-06-25 ENCOUNTER — Other Ambulatory Visit (INDEPENDENT_AMBULATORY_CARE_PROVIDER_SITE_OTHER): Payer: Self-pay | Admitting: Adult Health

## 2021-06-25 ENCOUNTER — Encounter (INDEPENDENT_AMBULATORY_CARE_PROVIDER_SITE_OTHER): Payer: Self-pay | Admitting: Adult Health

## 2021-06-25 ENCOUNTER — Other Ambulatory Visit: Payer: Self-pay

## 2021-06-25 VITALS — BP 131/83 | HR 95 | Temp 98.2°F | Ht 67.0 in | Wt 220.0 lb

## 2021-06-25 DIAGNOSIS — E669 Obesity, unspecified: Secondary | ICD-10-CM | POA: Diagnosis not present

## 2021-06-25 DIAGNOSIS — R7303 Prediabetes: Secondary | ICD-10-CM

## 2021-06-25 DIAGNOSIS — R0602 Shortness of breath: Secondary | ICD-10-CM

## 2021-06-25 DIAGNOSIS — E559 Vitamin D deficiency, unspecified: Secondary | ICD-10-CM | POA: Diagnosis not present

## 2021-06-25 DIAGNOSIS — Z6834 Body mass index (BMI) 34.0-34.9, adult: Secondary | ICD-10-CM

## 2021-06-25 DIAGNOSIS — Z9189 Other specified personal risk factors, not elsewhere classified: Secondary | ICD-10-CM

## 2021-06-25 MED ORDER — VITAMIN D (ERGOCALCIFEROL) 1.25 MG (50000 UNIT) PO CAPS: 50000.0000 [IU] | ORAL_CAPSULE | ORAL | 0 refills | Status: DC

## 2021-06-25 MED ORDER — SEMAGLUTIDE (1 MG/DOSE) 4 MG/3ML ~~LOC~~ SOPN: 1.0000 mg | PEN_INJECTOR | SUBCUTANEOUS | 0 refills | Status: DC

## 2021-06-25 MED ORDER — METFORMIN HCL 500 MG PO TABS: ORAL_TABLET | ORAL | 0 refills | Status: DC

## 2021-06-25 NOTE — Progress Notes (Signed)
? ? ? ?Chief Complaint:  ? ?OBESITY ?Shelley Ryan is here to discuss her progress with her obesity treatment plan along with follow-up of her obesity related diagnoses. Shelley Ryan is on the Category 4 Plan and states she is following her eating plan approximately 80% of the time. Shelley Ryan states she is walking for 30 minutes 5 times per week. ? ?Today's visit was #: 41 ?Starting weight: 242 lbs ?Starting date: 07/02/2019 ?Today's weight: 220 lbs ?Today's date: 06/25/2021 ?Total lbs lost to date: 22 lbs ?Total lbs lost since last in-office visit: 4 lbs ? ?Interim History:  ?03/04/2020 - RMR 1610 ?07/02/2019 - RMR 1447,  ?10/13/2020 - RMR 2265. ?RMR today 1800. ?Metabolism decreased by 465 calories. ? ?Of note:  Tubal ligation. ? ?Subjective:  ? ?1. SOB (shortness of breath) ?She reports shortness of breath with extreme exertion, denies CP. ?She denies tobacco/vape use. ? ?2. Prediabetes ?Experiencing polyphagia and worsening A1c/insulin levels on max dose of Victoza. ?She denies family history of MTC or personal history of pancreatitis. ?Victoza replaced with Ozempic 1mg  and continued on Metformin 500mg  QD at last OV-05/26/21 ? ?3. Vitamin D deficiency ?She is currently taking prescription ergocalciferol 50,000 IU each week. She denies nausea, vomiting or muscle weakness. ? ?4. At risk for nausea ?Sury is at risk for nausea due to taking Ozempic for prediabetes. ? ?Assessment/Plan:  ? ?1. SOB (shortness of breath) ?Check IC. ? ?2. Prediabetes ?Refill Ozempic 1 mg once weekly. ?Refill metformin 500 mg daily. ?Check labs. ? ?- Refill Semaglutide, 1 MG/DOSE, 4 MG/3ML SOPN; Inject 1 mg as directed once a week.  Dispense: 3 mL; Refill: 0 ?- Refill metFORMIN (GLUCOPHAGE) 500 MG tablet; 1 tab at lunch  Dispense: 30 tablet; Refill: 0 ? ?- Comprehensive metabolic panel ?- Hemoglobin A1c ?- Insulin, random ?- Vitamin B12 ? ?3. Vitamin D deficiency ?Refill ergocalciferol 50,000 IU once weekly. ?Check vitamin D level today. ? ?- Refill Vitamin D,  Ergocalciferol, (DRISDOL) 1.25 MG (50000 UNIT) CAPS capsule; Take 1 capsule (50,000 Units total) by mouth every 7 (seven) days.  Dispense: 12 capsule; Refill: 0 ?- VITAMIN D 25 Hydroxy (Vit-D Deficiency, Fractures) ? ?4. At risk for nausea ?05/28/21 was given approximately 15 minutes of nausea prevention counseling today. Shelley Ryan is at risk for nausea due to her new or current medication. She was encouraged to titrate her medication slowly, make sure to stay hydrated, eat smaller portions throughout the day, and avoid high fat meals.  ? ?5. Current BMI 34.6 ? ?Aurorah is currently in the action stage of change. As such, her goal is to continue with weight loss efforts. She has agreed to the Category 2 Plan +100 calories and keeping a food journal and adhering to recommended goals of 1200-1300 calories and 85-90 protein.  ? ?Exercise goals:  As is. ? ?Behavioral modification strategies: increasing lean protein intake, decreasing simple carbohydrates, meal planning and cooking strategies, keeping healthy foods in the home, planning for success, and keeping a strict food journal. ? ?Shelley Ryan has agreed to follow-up with our clinic in 3 weeks. She was informed of the importance of frequent follow-up visits to maximize her success with intensive lifestyle modifications for her multiple health conditions.  ? ?Shelley Ryan was informed we would discuss her lab results at her next visit unless there is a critical issue that needs to be addressed sooner. Shelley Ryan agreed to keep her next visit at the agreed upon time to discuss these results. ? ?Objective:  ? ?Blood pressure 131/83, pulse 95,  temperature 98.2 ?F (36.8 ?C), height 5\' 7"  (1.702 m), weight 220 lb (99.8 kg), SpO2 97 %. ?Body mass index is 34.46 kg/m?. ? ?General: Cooperative, alert, well developed, in no acute distress. ?HEENT: Conjunctivae and lids unremarkable. ?Cardiovascular: Regular rhythm.  ?Lungs: Normal work of breathing. ?Neurologic: No focal deficits.   ? ?Lab Results  ?Component Value Date  ? CREATININE 0.91 03/23/2021  ? BUN 18 03/23/2021  ? NA 141 03/23/2021  ? K 4.2 03/23/2021  ? CL 106 03/23/2021  ? CO2 18 (L) 03/23/2021  ? ?Lab Results  ?Component Value Date  ? ALT 18 03/23/2021  ? AST 14 03/23/2021  ? ALKPHOS 74 03/23/2021  ? BILITOT 0.5 03/23/2021  ? ?Lab Results  ?Component Value Date  ? HGBA1C 5.8 (H) 03/23/2021  ? HGBA1C 5.7 (H) 10/13/2020  ? HGBA1C 5.8 (H) 05/27/2020  ? HGBA1C 5.9 (H) 02/04/2020  ? HGBA1C 5.5 10/23/2019  ? ?Lab Results  ?Component Value Date  ? INSULIN 17.5 03/23/2021  ? INSULIN 10.9 10/13/2020  ? INSULIN 11.4 05/27/2020  ? INSULIN 10.9 02/04/2020  ? INSULIN 18.2 10/23/2019  ? ?Lab Results  ?Component Value Date  ? TSH 3.060 07/02/2019  ? ?Lab Results  ?Component Value Date  ? CHOL 132 03/23/2021  ? HDL 48 03/23/2021  ? LDLCALC 66 03/23/2021  ? TRIG 94 03/23/2021  ? CHOLHDL 2.8 03/23/2021  ? ?Lab Results  ?Component Value Date  ? VD25OH 56.7 03/23/2021  ? VD25OH 43.2 10/13/2020  ? VD25OH 45.9 05/27/2020  ? ?Lab Results  ?Component Value Date  ? WBC 12.1 (H) 08/29/2019  ? HGB 13.6 08/29/2019  ? HCT 41.3 08/29/2019  ? MCV 91.0 08/29/2019  ? PLT 211 08/29/2019  ? ?Attestation Statements:  ? ?Reviewed by clinician on day of visit: allergies, medications, problem list, medical history, surgical history, family history, social history, and previous encounter notes. ? ?I, 10/29/2019, CMA, am acting as Insurance claims handler for Energy manager, NP. ? ?I have reviewed the above documentation for accuracy and completeness, and I agree with the above. -  Nyron Mozer d. Pama Roskos, NP-C ?

## 2021-06-26 LAB — COMPREHENSIVE METABOLIC PANEL
ALT: 22 IU/L (ref 0–32)
AST: 20 IU/L (ref 0–40)
Albumin/Globulin Ratio: 1.9 (ref 1.2–2.2)
Albumin: 4.9 g/dL — ABNORMAL HIGH (ref 3.8–4.8)
Alkaline Phosphatase: 87 IU/L (ref 44–121)
BUN/Creatinine Ratio: 19 (ref 9–23)
BUN: 16 mg/dL (ref 6–24)
Bilirubin Total: 0.3 mg/dL (ref 0.0–1.2)
CO2: 19 mmol/L — ABNORMAL LOW (ref 20–29)
Calcium: 9.4 mg/dL (ref 8.7–10.2)
Chloride: 105 mmol/L (ref 96–106)
Creatinine, Ser: 0.86 mg/dL (ref 0.57–1.00)
Globulin, Total: 2.6 g/dL (ref 1.5–4.5)
Glucose: 95 mg/dL (ref 70–99)
Potassium: 4.6 mmol/L (ref 3.5–5.2)
Sodium: 141 mmol/L (ref 134–144)
Total Protein: 7.5 g/dL (ref 6.0–8.5)
eGFR: 84 mL/min/{1.73_m2} (ref >59)

## 2021-06-26 LAB — HEMOGLOBIN A1C
Est. average glucose Bld gHb Est-mCnc: 117 mg/dL
Hgb A1c MFr Bld: 5.7 % — ABNORMAL HIGH (ref 4.8–5.6)

## 2021-06-26 LAB — VITAMIN B12: Vitamin B-12: 625 pg/mL (ref 232–1245)

## 2021-06-26 LAB — INSULIN, RANDOM: INSULIN: 18.2 u[IU]/mL (ref 2.6–24.9)

## 2021-06-26 LAB — VITAMIN D 25 HYDROXY (VIT D DEFICIENCY, FRACTURES): Vit D, 25-Hydroxy: 55.2 ng/mL (ref 30.0–100.0)

## 2021-07-16 ENCOUNTER — Encounter (INDEPENDENT_AMBULATORY_CARE_PROVIDER_SITE_OTHER): Payer: Self-pay | Admitting: Adult Health

## 2021-07-16 ENCOUNTER — Other Ambulatory Visit: Payer: Self-pay

## 2021-07-16 ENCOUNTER — Ambulatory Visit (INDEPENDENT_AMBULATORY_CARE_PROVIDER_SITE_OTHER): Payer: BC Managed Care – PPO | Admitting: Adult Health

## 2021-07-16 VITALS — BP 123/84 | HR 85 | Temp 98.0°F | Ht 67.0 in | Wt 219.0 lb

## 2021-07-16 DIAGNOSIS — E669 Obesity, unspecified: Secondary | ICD-10-CM | POA: Diagnosis not present

## 2021-07-16 DIAGNOSIS — R7303 Prediabetes: Secondary | ICD-10-CM | POA: Diagnosis not present

## 2021-07-16 DIAGNOSIS — Z9189 Other specified personal risk factors, not elsewhere classified: Secondary | ICD-10-CM

## 2021-07-16 DIAGNOSIS — E782 Mixed hyperlipidemia: Secondary | ICD-10-CM

## 2021-07-16 DIAGNOSIS — E559 Vitamin D deficiency, unspecified: Secondary | ICD-10-CM

## 2021-07-16 DIAGNOSIS — Z6839 Body mass index (BMI) 39.0-39.9, adult: Secondary | ICD-10-CM

## 2021-07-16 MED ORDER — ROSUVASTATIN CALCIUM 5 MG PO TABS: ORAL_TABLET | ORAL | 0 refills | Status: DC

## 2021-07-16 MED ORDER — SEMAGLUTIDE (1 MG/DOSE) 4 MG/3ML ~~LOC~~ SOPN: 1.0000 mg | PEN_INJECTOR | SUBCUTANEOUS | 0 refills | Status: DC

## 2021-07-16 NOTE — Progress Notes (Signed)
? ? ? ?Chief Complaint:  ? ?OBESITY ?Shelley Ryan is here to discuss her progress with her obesity treatment plan along with follow-up of her obesity related diagnoses. [name] is on the Category 2 Plan +100 calories and keeping a food journal and adhering to recommended goals of 1200-1300 calories and 85-90 grams of protein and states she is following her eating plan approximately 80% of the time. [name] states she is walking for 20 minutes 3 times per week. ? ?Today's visit was #: 42 ?Starting weight: 242 lbs ?Starting date: 07/02/2019 ?Today's weight: 219 lbs ?Today's date: 07/16/2021 ?Total lbs lost to date: 23 lbs ?Total lbs lost since last in-office visit: 1 lb ? ?Interim History:  ?[name] has had an increase in migraine headache episodes - last week daily HA sx's. ?[name] infusion at [facility] yesterday - dosage 300 mg every 3 months. ?She denies HA at present. ? ?Subjective:  ? ?1. Mixed hyperlipidemia ?Discussed labs with patient today.  ?On 06/25/2021, CMP - LFTs normal. ?Crestor 5 mg daily - denies myalgias. ? ?2. Prediabetes ?Discussed labs with patient today.  ?On 06/25/2021, A1c 5.7 - down from 5.8 on 03/23/2021. ?BG 95, insulin level - 18.2 - slightly elevated. ?Victoza replaced with Ozempic 1 mg, continued on metfor;min 500 mg daily on 05/26/2021. ? ?3. Vitamin D deficiency ?Discussed labs with patient today.  ?On 06/25/2021, vitamin D level - 55.2 - stable. ?She is currently taking prescription ergocalciferol 50,000 IU each week. She denies nausea, vomiting or muscle weakness. ? ?4. At risk for heart disease ?[name] is at higher than average risk for cardiovascular disease due to hyperlipidemia, prediabetes, and obesity. ? ?Assessment/Plan:  ? ?1. Mixed hyperlipidemia ?Refill Crestor 5 mg daily. ? ?- Refill rosuvastatin (CRESTOR) 5 MG tablet; One tablet by mouth daily  Dispense: 90 tablet; Refill: 0 ? ?2. Prediabetes ?Refill Ozempic 1 mg once weekly. ? ?- Refill Semaglutide, 1 MG/DOSE, 4 MG/3ML SOPN; Inject 1 mg as  directed once a week.  Dispense: 3 mL; Refill: 0 ? ?3. Vitamin D deficiency ?Continue ergocalciferol 50,000 IU once weekly.  No need for refill. ? ?4. At risk for heart disease ?Shelley Ryan was given approximately 15 minutes of coronary artery disease prevention counseling today. She is 46 y.o. female and has risk factors for heart disease including obesity. We discussed intensive lifestyle modifications today with an emphasis on specific weight loss instructions and strategies. ? ?Repetitive spaced learning was employed today to elicit superior memory formation and behavioral change.  ? ?5. Current BMI 34.3 ? ?[name] is currently in the action stage of change. As such, her goal is to continue with weight loss efforts. She has agreed to keeping a food journal and adhering to recommended goals of 1200-1300 calories and 85-90 protein.  ? ?Check waist circumference at next office visit. ? ?Exercise goals:  As is. ? ?Behavioral modification strategies: increasing lean protein intake, decreasing simple carbohydrates, meal planning and cooking strategies, keeping healthy foods in the home, and planning for success. ? ?[name] has agreed to follow-up with our clinic in 2-3 weeks. She was informed of the importance of frequent follow-up visits to maximize her success with intensive lifestyle modifications for her multiple health conditions.  ? ?Objective:  ? ?Blood pressure 123/84, pulse 85, temperature 98 ?F (36.7 ?C), height 5\' 7"  (1.702 m), weight 219 lb (99.3 kg), SpO2 98 %. ?Body mass index is 34.3 kg/m?. ? ?General: Cooperative, alert, well developed, in no acute distress. ?HEENT: Conjunctivae and lids unremarkable. ?Cardiovascular: Regular rhythm.  ?  Lungs: Normal work of breathing. ?Neurologic: No focal deficits.  ? ?Lab Results  ?Component Value Date  ? CREATININE 0.86 06/25/2021  ? BUN 16 06/25/2021  ? NA 141 06/25/2021  ? K 4.6 06/25/2021  ? CL 105 06/25/2021  ? CO2 19 (L) 06/25/2021  ? ?Lab Results  ?Component Value Date   ? ALT 22 06/25/2021  ? AST 20 06/25/2021  ? ALKPHOS 87 06/25/2021  ? BILITOT 0.3 06/25/2021  ? ?Lab Results  ?Component Value Date  ? HGBA1C 5.7 (H) 06/25/2021  ? HGBA1C 5.8 (H) 03/23/2021  ? HGBA1C 5.7 (H) 10/13/2020  ? HGBA1C 5.8 (H) 05/27/2020  ? HGBA1C 5.9 (H) 02/04/2020  ? ?Lab Results  ?Component Value Date  ? INSULIN 18.2 06/25/2021  ? INSULIN 17.5 03/23/2021  ? INSULIN 10.9 10/13/2020  ? INSULIN 11.4 05/27/2020  ? INSULIN 10.9 02/04/2020  ? ?Lab Results  ?Component Value Date  ? TSH 3.060 07/02/2019  ? ?Lab Results  ?Component Value Date  ? CHOL 132 03/23/2021  ? HDL 48 03/23/2021  ? LDLCALC 66 03/23/2021  ? TRIG 94 03/23/2021  ? CHOLHDL 2.8 03/23/2021  ? ?Lab Results  ?Component Value Date  ? VD25OH 55.2 06/25/2021  ? VD25OH 56.7 03/23/2021  ? VD25OH 43.2 10/13/2020  ? ?Lab Results  ?Component Value Date  ? WBC 12.1 (H) 08/29/2019  ? HGB 13.6 08/29/2019  ? HCT 41.3 08/29/2019  ? MCV 91.0 08/29/2019  ? PLT 211 08/29/2019  ? ?Attestation Statements:  ? ?Reviewed by clinician on day of visit: allergies, medications, problem list, medical history, surgical history, family history, social history, and previous encounter notes. ? ?I, Insurance claims handler, CMA, am acting as Energy manager for William Hamburger, NP. ? ?I have reviewed the above documentation for accuracy and completeness, and I agree with the above. -  Kyon Bentler d. Jadis Pitter, NP-C ?

## 2021-07-29 ENCOUNTER — Ambulatory Visit (INDEPENDENT_AMBULATORY_CARE_PROVIDER_SITE_OTHER): Payer: BC Managed Care – PPO | Admitting: Adult Health

## 2021-07-29 ENCOUNTER — Other Ambulatory Visit (INDEPENDENT_AMBULATORY_CARE_PROVIDER_SITE_OTHER): Payer: Self-pay | Admitting: Adult Health

## 2021-07-29 ENCOUNTER — Encounter (INDEPENDENT_AMBULATORY_CARE_PROVIDER_SITE_OTHER): Payer: Self-pay | Admitting: Adult Health

## 2021-07-29 VITALS — BP 125/84 | HR 95 | Temp 98.1°F | Ht 67.0 in | Wt 218.0 lb

## 2021-07-29 DIAGNOSIS — E669 Obesity, unspecified: Secondary | ICD-10-CM

## 2021-07-29 DIAGNOSIS — R7303 Prediabetes: Secondary | ICD-10-CM

## 2021-07-29 DIAGNOSIS — R1013 Epigastric pain: Secondary | ICD-10-CM | POA: Diagnosis not present

## 2021-07-29 DIAGNOSIS — K311 Adult hypertrophic pyloric stenosis: Secondary | ICD-10-CM

## 2021-07-29 DIAGNOSIS — Z6834 Body mass index (BMI) 34.0-34.9, adult: Secondary | ICD-10-CM

## 2021-07-29 DIAGNOSIS — Z9189 Other specified personal risk factors, not elsewhere classified: Secondary | ICD-10-CM

## 2021-07-29 MED ORDER — METFORMIN HCL 500 MG PO TABS: ORAL_TABLET | ORAL | 0 refills | Status: DC

## 2021-07-30 ENCOUNTER — Other Ambulatory Visit (INDEPENDENT_AMBULATORY_CARE_PROVIDER_SITE_OTHER): Payer: Self-pay | Admitting: Adult Health

## 2021-07-30 ENCOUNTER — Encounter (INDEPENDENT_AMBULATORY_CARE_PROVIDER_SITE_OTHER): Payer: Self-pay | Admitting: Adult Health

## 2021-07-30 DIAGNOSIS — R7303 Prediabetes: Secondary | ICD-10-CM

## 2021-07-30 LAB — COMPREHENSIVE METABOLIC PANEL
ALT: 19 IU/L (ref 0–32)
AST: 16 IU/L (ref 0–40)
Albumin/Globulin Ratio: 1.8 (ref 1.2–2.2)
Albumin: 4.6 g/dL (ref 3.8–4.8)
Alkaline Phosphatase: 84 IU/L (ref 44–121)
BUN/Creatinine Ratio: 14 (ref 9–23)
BUN: 13 mg/dL (ref 6–24)
Bilirubin Total: 0.4 mg/dL (ref 0.0–1.2)
CO2: 21 mmol/L (ref 20–29)
Calcium: 9.6 mg/dL (ref 8.7–10.2)
Chloride: 105 mmol/L (ref 96–106)
Creatinine, Ser: 0.95 mg/dL (ref 0.57–1.00)
Globulin, Total: 2.5 g/dL (ref 1.5–4.5)
Glucose: 92 mg/dL (ref 70–99)
Potassium: 4.1 mmol/L (ref 3.5–5.2)
Sodium: 140 mmol/L (ref 134–144)
Total Protein: 7.1 g/dL (ref 6.0–8.5)
eGFR: 74 mL/min/{1.73_m2} (ref >59)

## 2021-07-30 LAB — LIPASE: Lipase: 35 U/L (ref 14–72)

## 2021-07-30 LAB — AMYLASE: Amylase: 48 U/L (ref 31–110)

## 2021-08-06 NOTE — Progress Notes (Signed)
? ? ? ?Chief Complaint:  ? ?OBESITY ?Shelley Ryan is here to discuss her progress with her obesity treatment plan along with follow-up of her obesity related diagnoses. [name] is on the Category 2 Plan and states she is following her eating plan approximately 90% of the time. [name] states she is walking for 30 minutes 2 times per week. ? ?Today's visit was #: 57 ?Starting weight: 242 lbs ?Starting date: 07/02/2019 ?Today's weight: 218 lbs ?Today's date: 07/29/2021 ?Total lbs lost to date: 24 lbs ?Total lbs lost since last in-office visit: 1 lb ? ?Interim History:  ?Charolotte injects Ozempic 1 mg on Sundays. ?Monday - abdominal pain, bloating, nausea without vomiting - first episode of GI side effects. ?She endorses regular BMs without hematochezia. ? ?Of note:  Per Neurology - recommended to reduce weight another 21 pounds in order to help decrease intracranial pressure. ? ?Subjective:  ? ?1. Prediabetes ?On 05/26/2021, Victoza 1.8 mg replaced with Ozempic 1 mg - Victoza ineffective. ?[name] injects Ozempic 1 mg on Sundays. ?Monday - abdominal pain, bloating, nausea without vomiting - first episode of GI side effects. ?She endorses regular BMs without hematochezia. ? ?2. Epigastric pain ?[name] injects Ozempic 1 mg on Sundays. ?Monday - abdominal pain, bloating, nausea without vomiting - first episode of GI side effects. ?She has hx of pyloric stenosis. ? ?3. At risk for deficient intake of food ?[name] is at risk for deficient food intake due to nausea without vomiting and taking Ozempic. ? ?Assessment/Plan:  ? ?1. Prediabetes ?Stop Ozempic. ?Refill and increase metformin 500 mg BID with meals. ? ?- Refill and increase metFORMIN (GLUCOPHAGE) 500 MG tablet; 1 tab twice daily with meals  Dispense: 60 tablet; Refill: 0 ? ?2. Epigastric pain ?Check labs- send pt MyChart message after labs result. ?Stop Ozempic. ? ?- Comprehensive metabolic panel ?- Lipase ?- Amylase ? ?3. At risk for deficient intake of food ?[name] was given  approximately 15 minutes of deficient intake of food prevention counseling today. [name] is at risk for eating too few calories based on current food recall. She was encouraged to focus on meeting caloric and protein goals according to her recommended meal plan.  ? ?4. Current BMI 34.2 ? ?[name] is currently in the action stage of change. As such, her goal is to continue with weight loss efforts. She has agreed to the Category 2 Plan.  ? ?Exercise goals:  As is. ? ?Behavioral modification strategies: increasing lean protein intake, decreasing simple carbohydrates, meal planning and cooking strategies, keeping healthy foods in the home, and planning for success. ? ?[name] has agreed to follow-up with our clinic in 2 weeks. She was informed of the importance of frequent follow-up visits to maximize her success with intensive lifestyle modifications for her multiple health conditions.  ? ?[name] was informed we would discuss her lab results at her next visit unless there is a critical issue that needs to be addressed sooner. [name] agreed to keep her next visit at the agreed upon time to discuss these results. ? ?Objective:  ? ?Blood pressure 125/84, pulse 95, temperature 98.1 ?F (36.7 ?C), height 5\' 7"  (1.702 m), weight 218 lb (98.9 kg), SpO2 96 %. ?Body mass index is 34.14 kg/m?. ? ?General: Cooperative, alert, well developed, in no acute distress. ?HEENT: Conjunctivae and lids unremarkable. ?Cardiovascular: Regular rhythm.  ?Lungs: Normal work of breathing. ?Neurologic: No focal deficits.  ? ?Lab Results  ?Component Value Date  ? CREATININE 0.95 07/29/2021  ? BUN 13 07/29/2021  ? NA 140  07/29/2021  ? K 4.1 07/29/2021  ? CL 105 07/29/2021  ? CO2 21 07/29/2021  ? ?Lab Results  ?Component Value Date  ? ALT 19 07/29/2021  ? AST 16 07/29/2021  ? ALKPHOS 84 07/29/2021  ? BILITOT 0.4 07/29/2021  ? ?Lab Results  ?Component Value Date  ? HGBA1C 5.7 (H) 06/25/2021  ? HGBA1C 5.8 (H) 03/23/2021  ? HGBA1C 5.7 (H) 10/13/2020  ? HGBA1C  5.8 (H) 05/27/2020  ? HGBA1C 5.9 (H) 02/04/2020  ? ?Lab Results  ?Component Value Date  ? INSULIN 18.2 06/25/2021  ? INSULIN 17.5 03/23/2021  ? INSULIN 10.9 10/13/2020  ? INSULIN 11.4 05/27/2020  ? INSULIN 10.9 02/04/2020  ? ?Lab Results  ?Component Value Date  ? TSH 3.060 07/02/2019  ? ?Lab Results  ?Component Value Date  ? CHOL 132 03/23/2021  ? HDL 48 03/23/2021  ? [place] 66 03/23/2021  ? TRIG 94 03/23/2021  ? CHOLHDL 2.8 03/23/2021  ? ?Lab Results  ?Component Value Date  ? VD25OH 55.2 06/25/2021  ? VD25OH 56.7 03/23/2021  ? VD25OH 43.2 10/13/2020  ? ?Lab Results  ?Component Value Date  ? WBC 12.1 (H) 08/29/2019  ? HGB 13.6 08/29/2019  ? HCT 41.3 08/29/2019  ? MCV 91.0 08/29/2019  ? PLT 211 08/29/2019  ? ?Attestation Statements:  ? ?Reviewed by clinician on day of visit: allergies, medications, problem list, medical history, surgical history, family history, social history, and previous encounter notes. ? ?I, Water quality scientist, CMA, am acting as Location manager for Mina Marble, NP. ? ?I have reviewed the above documentation for accuracy and completeness, and I agree with the above. -  Gildo Crisco d. [place], NP-C ?

## 2021-08-10 DIAGNOSIS — K311 Adult hypertrophic pyloric stenosis: Secondary | ICD-10-CM | POA: Insufficient documentation

## 2021-08-10 DIAGNOSIS — R1013 Epigastric pain: Secondary | ICD-10-CM | POA: Insufficient documentation

## 2021-08-11 ENCOUNTER — Ambulatory Visit (INDEPENDENT_AMBULATORY_CARE_PROVIDER_SITE_OTHER): Payer: BC Managed Care – PPO | Admitting: Physician Assistant

## 2021-08-13 ENCOUNTER — Ambulatory Visit (INDEPENDENT_AMBULATORY_CARE_PROVIDER_SITE_OTHER): Payer: BC Managed Care – PPO | Admitting: Adult Health

## 2021-08-13 ENCOUNTER — Encounter (INDEPENDENT_AMBULATORY_CARE_PROVIDER_SITE_OTHER): Payer: Self-pay | Admitting: Adult Health

## 2021-08-13 VITALS — BP 122/77 | HR 101 | Temp 98.1°F | Ht 67.0 in | Wt 219.0 lb

## 2021-08-13 DIAGNOSIS — Z6834 Body mass index (BMI) 34.0-34.9, adult: Secondary | ICD-10-CM | POA: Diagnosis not present

## 2021-08-13 DIAGNOSIS — R7303 Prediabetes: Secondary | ICD-10-CM

## 2021-08-13 DIAGNOSIS — E669 Obesity, unspecified: Secondary | ICD-10-CM

## 2021-08-25 NOTE — Progress Notes (Signed)
? ? ? ?Chief Complaint:  ? ?OBESITY ?Shelley Ryan is here to discuss her progress with her obesity treatment plan along with follow-up of her obesity related diagnoses. Shelley Ryan is on the Category 2 Plan + 100 calories and keeping a food journal and adhering to recommended goals of 1200-1300 calories and 85-90 grams of protein and states she is following her eating plan approximately 80% of the time. Shelley Ryan states she is walking 20-30 minutes 4 times per week. ? ?Today's visit was #: 44 ?Starting weight: 242 lbs ?Starting date: 07/02/2019 ?Today's weight: 219 lbs ?Today's date: 08/13/2021 ?Total lbs lost to date: 59 ?Total lbs lost since last in-office visit: 0 ? 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  ?She will often skip lunch or consume late in the afternoon. ?She reports that dinner time is usually at 9pm. ? 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 ? ?Bioimpedance results, reviewed with pt ?Muscle mass +1.2 lbs ?Adipose mass -0.6 lbs ?Water weight  +1.4 lbs. ? ?Subjective:  ? ?1. Prediabetes ?Epigastric pain and nausea after Ozempic 1 mg injections.   ?Shelley Ryan discontinued Ozempic on 07/29/2021.  ?07/29/21 Amylase and Lipase levels- WNL ?She remained on metformin 500 mg BID, and she denies polyphagia.  ? ?Assessment/Plan:  ? ?1. Prediabetes ?Remain off GLP-1, and continue metformin 500 mg BID with meals.  ? ?2. Current BMI 34.3 ?Shelley Ryan is currently in the action stage of change. As such, her goal is to continue with weight loss efforts. She has agreed to the Category 2 Plan.  ? ?Exercise goals: As is ? ?Behavioral modification strategies: increasing lean protein intake and decreasing simple carbohydrates, meal planning and cooking strategies, keeping healthy foods in the home and planning for success. ? ?Shelley Ryan has agreed to follow-up with our clinic in 2-3 weeks. She was informed of the  importance of frequent follow-up visits to maximize her success with intensive lifestyle modifications for her multiple health conditions.  ? ?Objective:  ? ?Blood pressure 122/77, pulse (!) 101, temperature 98.1 ?F (36.7 ?C), height 5\' 7"  (1.702 m), weight 219 lb (99.3 kg), SpO2 92 %. ?Body mass index is 34.3 kg/m?. ? ?General: Cooperative, alert, well developed, in no acute distress. ?HEENT: Conjunctivae and lids unremarkable. ?Cardiovascular: Regular rhythm.  ?Lungs: Normal work of breathing. ?Neurologic: No focal deficits.  ? ?Lab Results  ?Component Value Date  ? CREATININE 0.95 07/29/2021  ? BUN 13 07/29/2021  ? NA 140 07/29/2021  ? K 4.1 07/29/2021  ? CL 105 07/29/2021  ? CO2 21 07/29/2021  ? ?Lab Results  ?Component Value Date  ? ALT 19 07/29/2021  ? AST 16 07/29/2021  ? ALKPHOS 84 07/29/2021  ? BILITOT 0.4 07/29/2021  ? ?Lab Results  ?Component Value Date  ? HGBA1C 5.7 (H) 06/25/2021  ? HGBA1C 5.8 (H) 03/23/2021  ? HGBA1C 5.7 (H) 10/13/2020  ? HGBA1C 5.8 (H) 05/27/2020  ? HGBA1C 5.9 (H) 02/04/2020  ? ?Lab Results  ?Component Value Date  ? INSULIN 18.2 06/25/2021  ? INSULIN 17.5 03/23/2021  ? INSULIN 10.9 10/13/2020  ? INSULIN 11.4 05/27/2020  ? INSULIN 10.9 02/04/2020  ? ?Lab Results  ?Component Value Date  ? TSH 3.060 07/02/2019  ? ?Lab Results  ?Component Value Date  ? CHOL 132 03/23/2021  ? HDL 48 03/23/2021  ? LDLCALC 66 03/23/2021  ? TRIG 94 03/23/2021  ? CHOLHDL 2.8 03/23/2021  ? ?Lab Results  ?  Component Value Date  ? VD25OH 55.2 06/25/2021  ? VD25OH 56.7 03/23/2021  ? VD25OH 43.2 10/13/2020  ? ?Lab Results  ?Component Value Date  ? WBC 12.1 (H) 08/29/2019  ? HGB 13.6 08/29/2019  ? HCT 41.3 08/29/2019  ? MCV 91.0 08/29/2019  ? PLT 211 08/29/2019  ? ?No results found for: IRON, TIBC, FERRITIN ? ?Attestation Statements:  ? ?Reviewed by clinician on day of visit: allergies, medications, problem list, medical history, surgical history, family history, social history, and previous encounter notes. ? ?Time  spent on visit including pre-visit chart review and post-visit care and charting was 27. ? ?I, Janey Greaser, am acting as Energy manager for William Hamburger, NP. ? ?I have reviewed the above documentation for accuracy and completeness, and I agree with the above. -  Ed Rayson d. Alok Minshall, NP-C  ?

## 2021-09-03 ENCOUNTER — Other Ambulatory Visit (INDEPENDENT_AMBULATORY_CARE_PROVIDER_SITE_OTHER): Payer: Self-pay | Admitting: Adult Health

## 2021-09-03 ENCOUNTER — Encounter (INDEPENDENT_AMBULATORY_CARE_PROVIDER_SITE_OTHER): Payer: Self-pay | Admitting: Adult Health

## 2021-09-03 ENCOUNTER — Ambulatory Visit (INDEPENDENT_AMBULATORY_CARE_PROVIDER_SITE_OTHER): Payer: BC Managed Care – PPO | Admitting: Adult Health

## 2021-09-03 VITALS — BP 139/84 | HR 100 | Temp 98.2°F | Ht 67.0 in | Wt 220.0 lb

## 2021-09-03 DIAGNOSIS — R1013 Epigastric pain: Secondary | ICD-10-CM

## 2021-09-03 DIAGNOSIS — N6489 Other specified disorders of breast: Secondary | ICD-10-CM

## 2021-09-03 DIAGNOSIS — R7303 Prediabetes: Secondary | ICD-10-CM

## 2021-09-03 DIAGNOSIS — E669 Obesity, unspecified: Secondary | ICD-10-CM | POA: Diagnosis not present

## 2021-09-03 DIAGNOSIS — Z6834 Body mass index (BMI) 34.0-34.9, adult: Secondary | ICD-10-CM

## 2021-09-03 MED ORDER — METFORMIN HCL 500 MG PO TABS: ORAL_TABLET | ORAL | 0 refills | Status: DC

## 2021-09-09 DIAGNOSIS — N6489 Other specified disorders of breast: Secondary | ICD-10-CM | POA: Insufficient documentation

## 2021-09-09 NOTE — Progress Notes (Addendum)
Chief Complaint:   OBESITY Shelley Ryan is here to discuss her progress with her obesity treatment plan along with follow-up of her obesity related diagnoses. [name] is on the Category 2 Plan and states she is following her eating plan approximately 80% of the time. [name] states she is walking for 20 minutes 5 times per week. 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  Reviewed Bioimpedance with pt: Up 0.8 pound muscle mass  Down 0.2 pound adipose mass  Up 1 pound water weight  Subjective:   1. Prediabetes Previously on Victoza and Ozempic. Epigastric pain and nausea after Ozempic 1 mg injections.   Mariadelosang discontinued Ozempic on 07/29/2021. Now currently only on metformin 500 mg twice daily-breakfast/dinner-tolerating well. 07/29/2021 CMP GFR greater than 60. Discussed labs with patient today.  2. Epigastric pain 07/29/2021 amylase/lipase-both normal. She denies current GI upset.  Has been off of Ozempic for greater than 1 month. Discussed labs with patient today.  3.  Bilateral pendulous breasts Current cup size  38G. Positive for cervical/thoracic pain. She endorses intermittent upper extremity paresthesia.   Assessment/Plan:   1. Prediabetes Refill: - metFORMIN (GLUCOPHAGE) 500 MG tablet; 1 tab twice daily with meals  Dispense: 60 tablet; Refill: 0  2. Epigastric pain Remain off GLP-1 therapy.  3.  Bilateral pendulous breasts Future referral to plastics/Dr. Foster Simpson.  4. Current BMI 34.5 Goal weight 185 pounds.  Shelley Ryan is currently in the action stage of change. As such, her goal is to continue with weight loss efforts. She has agreed to the Category 2 Plan.   Exercise  goals: As is  Behavioral modification strategies: increasing lean protein intake, decreasing simple carbohydrates, meal planning and cooking strategies, keeping healthy foods in the home, and planning for success.  Shelley Ryan has agreed to follow-up with our clinic in 2-3 weeks. She was informed of the importance of frequent follow-up visits to maximize her success with intensive lifestyle modifications for her multiple health conditions.   Objective:   Blood pressure 139/84, pulse 100, temperature 98.2 F (36.8 C), height 5\' 7"  (1.702 m), weight 220 lb (99.8 kg), SpO2 100 %. Body mass index is 34.46 kg/m.  General: Cooperative, alert, well developed, in no acute distress. HEENT: Conjunctivae and lids unremarkable. Cardiovascular: Regular rhythm.  Lungs: Normal work of breathing. Neurologic: No focal deficits.   Lab Results  Component Value Date   CREATININE 0.95 07/29/2021   BUN 13 07/29/2021   NA 140 07/29/2021   K 4.1 07/29/2021   CL 105 07/29/2021   CO2 21 07/29/2021   Lab Results  Component Value Date   ALT 19 07/29/2021   AST 16 07/29/2021   ALKPHOS 84 07/29/2021   BILITOT 0.4 07/29/2021   Lab Results  Component Value Date   HGBA1C 5.7 (H) 06/25/2021   HGBA1C 5.8 (H) 03/23/2021   HGBA1C 5.7 (H) 10/13/2020   HGBA1C 5.8 (H) 05/27/2020   HGBA1C 5.9 (H) 02/04/2020   Lab Results  Component Value Date   INSULIN 18.2 06/25/2021   INSULIN 17.5 03/23/2021   INSULIN 10.9 10/13/2020   INSULIN 11.4 05/27/2020   INSULIN 10.9 02/04/2020   Lab Results  Component Value Date   TSH 3.060 07/02/2019   Lab Results  Component  Value Date   CHOL 132 03/23/2021   HDL 48 03/23/2021   LDLCALC 66 03/23/2021   TRIG 94 03/23/2021   CHOLHDL 2.8 03/23/2021   Lab Results  Component Value Date   VD25OH 55.2 06/25/2021   VD25OH 56.7 03/23/2021   VD25OH 43.2 10/13/2020   Lab Results  Component Value Date   WBC 12.1 (H) 08/29/2019   HGB 13.6 08/29/2019   HCT 41.3 08/29/2019    MCV 91.0 08/29/2019   PLT 211 08/29/2019   No results found for: IRON, TIBC, FERRITIN   Attestation Statements:   Reviewed by clinician on day of visit: allergies, medications, problem list, medical history, surgical history, family history, social history, and previous encounter notes.  I, Jesse Sans, FNP, am acting as Energy manager for William Hamburger, NP .  I have reviewed the above documentation for accuracy and completeness, and I agree with the above. -  Adia Crammer d. Diala Waxman, NP-C

## 2021-09-15 ENCOUNTER — Encounter (INDEPENDENT_AMBULATORY_CARE_PROVIDER_SITE_OTHER): Payer: Self-pay | Admitting: Adult Health

## 2021-09-15 ENCOUNTER — Ambulatory Visit (INDEPENDENT_AMBULATORY_CARE_PROVIDER_SITE_OTHER): Payer: BC Managed Care – PPO | Admitting: Adult Health

## 2021-09-15 VITALS — BP 126/78 | HR 85 | Temp 97.2°F | Ht 67.0 in | Wt 221.8 lb

## 2021-09-15 DIAGNOSIS — E669 Obesity, unspecified: Secondary | ICD-10-CM

## 2021-09-15 DIAGNOSIS — Z6834 Body mass index (BMI) 34.0-34.9, adult: Secondary | ICD-10-CM | POA: Diagnosis not present

## 2021-09-15 DIAGNOSIS — E782 Mixed hyperlipidemia: Secondary | ICD-10-CM

## 2021-09-17 NOTE — Progress Notes (Signed)
Chief Complaint:   OBESITY Shelley Ryan is here to discuss her progress with her obesity treatment plan along with follow-up of her obesity related diagnoses. Shelley Ryan is on the Category 2 Plan and states she is following her eating plan approximately 90% of the time. Shelley Ryan states she is walking for 20 minutes 4-5 times per week.  Today's visit was #: 46 Starting weight: 242 lbs Starting date: 07/02/2019 Today's weight: 221 lbs Today's date: 09/15/21 Total lbs lost to date: 21 lbs Total lbs lost since last in-office visit: +1  Interim History:  She is traveling to [place] next [employer].   She will be in the [place] for 5 days.   Will have access to breakfast buffet each a.m.   She will pack snacks for trip. Snacks- yogurt.  Subjective:   1. Mixed hyperlipidemia She is on Crestor 5 mg once daily-denies myalgias. She denies chest pain with exertion. She denies tobacco/vape use.  Assessment/Plan:   1. Mixed hyperlipidemia Remain well-hydrated, continue daily statin therapy.  2. Obesity, Current BMI 34.7 Shelley Ryan is currently in the action stage of change. As such, her goal is to continue with weight loss efforts. She has agreed to the Category 2 Plan.   Handouts: 100/200 snack lists.  Exercise goals: as is.  Behavioral modification strategies: increasing lean protein intake, decreasing simple carbohydrates, meal planning and cooking strategies, keeping healthy foods in the home, travel eating strategies, and planning for success.  Shelley Ryan has agreed to follow-up with our clinic in 3 weeks. She was informed of the importance of frequent follow-up visits to maximize her success with intensive lifestyle modifications for her multiple health conditions.   Objective:   Blood pressure 126/78, pulse 85, temperature (!) 97.2 F (36.2 C), height 5\' 7"  (1.702 m), weight 221 lb 12.8 oz (100.6 kg), SpO2 96 %. Body mass index is 34.74 kg/m.  General: Cooperative, alert, well  developed, in no acute distress. HEENT: Conjunctivae and lids unremarkable. Cardiovascular: Regular rhythm.  Lungs: Normal work of breathing. Neurologic: No focal deficits.   Lab Results  Component Value Date   CREATININE 0.95 07/29/2021   BUN 13 07/29/2021   NA 140 07/29/2021   K 4.1 07/29/2021   CL 105 07/29/2021   CO2 21 07/29/2021   Lab Results  Component Value Date   ALT 19 07/29/2021   AST 16 07/29/2021   ALKPHOS 84 07/29/2021   BILITOT 0.4 07/29/2021   Lab Results  Component Value Date   HGBA1C 5.7 (H) 06/25/2021   HGBA1C 5.8 (H) 03/23/2021   HGBA1C 5.7 (H) 10/13/2020   HGBA1C 5.8 (H) 05/27/2020   HGBA1C 5.9 (H) 02/04/2020   Lab Results  Component Value Date   INSULIN 18.2 06/25/2021   INSULIN 17.5 03/23/2021   INSULIN 10.9 10/13/2020   INSULIN 11.4 05/27/2020   INSULIN 10.9 02/04/2020   Lab Results  Component Value Date   TSH 3.060 07/02/2019   Lab Results  Component Value Date   CHOL 132 03/23/2021   HDL 48 03/23/2021   LDLCALC 66 03/23/2021   TRIG 94 03/23/2021   CHOLHDL 2.8 03/23/2021   Lab Results  Component Value Date   VD25OH 55.2 06/25/2021   VD25OH 56.7 03/23/2021   VD25OH 43.2 10/13/2020   Lab Results  Component Value Date   WBC 12.1 (H) 08/29/2019   HGB 13.6 08/29/2019   HCT 41.3 08/29/2019   MCV 91.0 08/29/2019   PLT 211 08/29/2019   No results found for: IRON,  TIBC, FERRITIN  Attestation Statements:   Reviewed by clinician on day of visit: allergies, medications, problem list, medical history, surgical history, family history, social history, and previous encounter notes.  Time spent on visit including pre-visit chart review and post-visit care and charting was 28 minutes.   I, Jesse Sans, FNP, am acting as Energy manager for William Hamburger, NP.  I have reviewed the above documentation for accuracy and completeness, and I agree with the above. -  Veronica Guerrant d. Aunesti Pellegrino, NP-C

## 2021-10-02 ENCOUNTER — Other Ambulatory Visit (INDEPENDENT_AMBULATORY_CARE_PROVIDER_SITE_OTHER): Payer: Self-pay | Admitting: Adult Health

## 2021-10-02 DIAGNOSIS — E782 Mixed hyperlipidemia: Secondary | ICD-10-CM

## 2021-10-02 DIAGNOSIS — R7303 Prediabetes: Secondary | ICD-10-CM

## 2021-10-06 ENCOUNTER — Encounter (INDEPENDENT_AMBULATORY_CARE_PROVIDER_SITE_OTHER): Payer: Self-pay | Admitting: Adult Health

## 2021-10-06 ENCOUNTER — Other Ambulatory Visit (INDEPENDENT_AMBULATORY_CARE_PROVIDER_SITE_OTHER): Payer: Self-pay | Admitting: Adult Health

## 2021-10-06 ENCOUNTER — Ambulatory Visit (INDEPENDENT_AMBULATORY_CARE_PROVIDER_SITE_OTHER): Payer: BC Managed Care – PPO | Admitting: Adult Health

## 2021-10-06 VITALS — BP 129/86 | HR 85 | Temp 98.0°F | Ht 67.0 in | Wt 226.0 lb

## 2021-10-06 DIAGNOSIS — Z9189 Other specified personal risk factors, not elsewhere classified: Secondary | ICD-10-CM

## 2021-10-06 DIAGNOSIS — R600 Localized edema: Secondary | ICD-10-CM | POA: Diagnosis not present

## 2021-10-06 DIAGNOSIS — Z7984 Long term (current) use of oral hypoglycemic drugs: Secondary | ICD-10-CM

## 2021-10-06 DIAGNOSIS — R7303 Prediabetes: Secondary | ICD-10-CM | POA: Diagnosis not present

## 2021-10-06 DIAGNOSIS — E782 Mixed hyperlipidemia: Secondary | ICD-10-CM

## 2021-10-06 DIAGNOSIS — E669 Obesity, unspecified: Secondary | ICD-10-CM | POA: Diagnosis not present

## 2021-10-06 DIAGNOSIS — Z6835 Body mass index (BMI) 35.0-35.9, adult: Secondary | ICD-10-CM

## 2021-10-06 MED ORDER — HYDROCHLOROTHIAZIDE 12.5 MG PO TABS: 12.5000 mg | ORAL_TABLET | Freq: Every day | ORAL | 0 refills | Status: DC

## 2021-10-06 MED ORDER — ROSUVASTATIN CALCIUM 5 MG PO TABS
ORAL_TABLET | ORAL | 0 refills | Status: DC
Start: 2021-10-06 — End: 2021-12-24

## 2021-10-06 MED ORDER — METFORMIN HCL 500 MG PO TABS: ORAL_TABLET | ORAL | 0 refills | Status: DC

## 2021-10-07 DIAGNOSIS — R600 Localized edema: Secondary | ICD-10-CM | POA: Insufficient documentation

## 2021-10-07 NOTE — Progress Notes (Signed)
Chief Complaint:   OBESITY Shelley Ryan is here to discuss her progress with her obesity treatment plan along with follow-up of her obesity related diagnoses. Shelley Ryan is on the Category 2 Plan and states she is following her eating plan approximately 70% of the time. Shelley Ryan states she is walking 10,000-20,000 steps per day.    Today's visit was #: 47 Starting weight: 242 lbs Starting date: 07/02/2019 Today's weight: 226 lbs Today's date: 10/06/2021 Total lbs lost to date: 16 Total lbs lost since last in-office visit: 0  Interim History:  Recent travel has made eating plan more of a challenge.   She was in [place] for 5 days, then home 4 days, then travelled [place] to [place] 4 days.   Flew home yesterday, endorses increased bilateral lower extremity edema. She denies dyspnea or cough.  Subjective:   1. Mixed hyperlipidemia On 07/29/2021 CMP-LFT within normal limits.   Last 2 lipid panels were within normal limits.   She is on rosuvastatin 5 mg daily, and she denies myalgias.  2. Pre-diabetes Victoza caused abdominal pain, therefore stopped GLP-1 and started metformin.   Currently on metformin 500 mg twice daily with meals.  3. Bilateral lower extremity edema She has frequent air travel of late. She endorses increased bilateral lower extremity edema. She denies dyspnea or cough.  4. At risk for heart disease Shelley Ryan is at higher than average risk for cardiovascular disease due to HLD and obesity.  Assessment/Plan:   1. Mixed hyperlipidemia We will refill rosuvastatin 5 mg once daily for 90 days, with no refills.  We will recheck labs at her next office visit.  - rosuvastatin (CRESTOR) 5 MG tablet; One tablet by mouth daily  Dispense: 90 tablet; Refill: 0  2. Pre-diabetes We will refill metformin 500 mg twice daily for 1 month.  We will recheck labs at her next office visit.  - metFORMIN (GLUCOPHAGE) 500 MG tablet; 1 tab twice daily with meals  Dispense: 60 tablet; Refill: 0  3.  Bilateral lower extremity edema Shelley Ryan will start hydrochlorothiazide 12.5 mg daily, with no refills.   7 DAY COURSE OF LOW DOSE DIURETIC TO reduce acute edema. Check blood pressure closely.   Hold diuretic if systolic blood pressure is <100.  - hydrochlorothiazide (HYDRODIURIL) 12.5 MG tablet; Take 1 tablet (12.5 mg total) by mouth daily.  Dispense: 7 tablet; Refill: 0  4. At risk for heart disease Shelley Ryan was given approximately 15 minutes of coronary artery disease prevention counseling today. She is 46 y.o. female and has risk factors for heart disease including obesity. We discussed intensive lifestyle modifications today with an emphasis on specific weight loss instructions and strategies.  Repetitive spaced learning was employed today to elicit superior memory formation and behavioral change.   5. Obesity, Current BMI 35.5 Shelley Ryan is currently in the action stage of change. As such, her goal is to continue with weight loss efforts. She has agreed to the Category 2 Plan + 100 calories.   We will recheck fasting labs at her next office visit.  Exercise goals: As is.  Behavioral modification strategies: increasing lean protein intake, decreasing simple carbohydrates, meal planning and cooking strategies, keeping healthy foods in the home, and planning for success.  Shelley Ryan has agreed to follow-up with our clinic in 4 weeks. She was informed of the importance of frequent follow-up visits to maximize her success with intensive lifestyle modifications for her multiple health conditions.   Objective:   Blood pressure 129/86, pulse 85, temperature  98 F (36.7 C), height 5\' 7"  (1.702 m), weight 226 lb (102.5 kg), SpO2 97 %. Body mass index is 35.4 kg/m.  General: Cooperative, alert, well developed, in no acute distress. HEENT: Conjunctivae and lids unremarkable. Cardiovascular: Regular rhythm.  Lungs: Normal work of breathing. Neurologic: No focal deficits.   Lab Results  Component Value  Date   CREATININE 0.95 07/29/2021   BUN 13 07/29/2021   NA 140 07/29/2021   K 4.1 07/29/2021   CL 105 07/29/2021   CO2 21 07/29/2021   Lab Results  Component Value Date   ALT 19 07/29/2021   AST 16 07/29/2021   ALKPHOS 84 07/29/2021   BILITOT 0.4 07/29/2021   Lab Results  Component Value Date   HGBA1C 5.7 (H) 06/25/2021   HGBA1C 5.8 (H) 03/23/2021   HGBA1C 5.7 (H) 10/13/2020   HGBA1C 5.8 (H) 05/27/2020   HGBA1C 5.9 (H) 02/04/2020   Lab Results  Component Value Date   INSULIN 18.2 06/25/2021   INSULIN 17.5 03/23/2021   INSULIN 10.9 10/13/2020   INSULIN 11.4 05/27/2020   INSULIN 10.9 02/04/2020   Lab Results  Component Value Date   TSH 3.060 07/02/2019   Lab Results  Component Value Date   CHOL 132 03/23/2021   HDL 48 03/23/2021   LDLCALC 66 03/23/2021   TRIG 94 03/23/2021   CHOLHDL 2.8 03/23/2021   Lab Results  Component Value Date   VD25OH 55.2 06/25/2021   VD25OH 56.7 03/23/2021   VD25OH 43.2 10/13/2020   Lab Results  Component Value Date   WBC 12.1 (H) 08/29/2019   HGB 13.6 08/29/2019   HCT 41.3 08/29/2019   MCV 91.0 08/29/2019   PLT 211 08/29/2019   No results found for: "IRON", "TIBC", "FERRITIN"  Attestation Statements:   Reviewed by clinician on day of visit: allergies, medications, problem list, medical history, surgical history, family history, social history, and previous encounter notes.   10/29/2019, am acting as transcriptionist for Trude Mcburney, NP.  I have reviewed the above documentation for accuracy and completeness, and I agree with the above. -  Keyri Salberg d. Briahna Pescador, NP-C

## 2021-11-02 ENCOUNTER — Encounter (INDEPENDENT_AMBULATORY_CARE_PROVIDER_SITE_OTHER): Payer: Self-pay | Admitting: Adult Health

## 2021-11-02 ENCOUNTER — Ambulatory Visit (INDEPENDENT_AMBULATORY_CARE_PROVIDER_SITE_OTHER): Payer: BC Managed Care – PPO | Admitting: Adult Health

## 2021-11-02 ENCOUNTER — Other Ambulatory Visit (INDEPENDENT_AMBULATORY_CARE_PROVIDER_SITE_OTHER): Payer: Self-pay | Admitting: Adult Health

## 2021-11-02 VITALS — BP 138/84 | HR 85 | Temp 98.0°F | Ht 67.0 in | Wt 224.0 lb

## 2021-11-02 DIAGNOSIS — E559 Vitamin D deficiency, unspecified: Secondary | ICD-10-CM

## 2021-11-02 DIAGNOSIS — R7303 Prediabetes: Secondary | ICD-10-CM

## 2021-11-02 DIAGNOSIS — I10 Essential (primary) hypertension: Secondary | ICD-10-CM | POA: Diagnosis not present

## 2021-11-02 DIAGNOSIS — Z6835 Body mass index (BMI) 35.0-35.9, adult: Secondary | ICD-10-CM

## 2021-11-02 DIAGNOSIS — E669 Obesity, unspecified: Secondary | ICD-10-CM

## 2021-11-02 DIAGNOSIS — R600 Localized edema: Secondary | ICD-10-CM

## 2021-11-02 DIAGNOSIS — Z9189 Other specified personal risk factors, not elsewhere classified: Secondary | ICD-10-CM

## 2021-11-02 MED ORDER — METFORMIN HCL 500 MG PO TABS: ORAL_TABLET | ORAL | 0 refills | Status: DC

## 2021-11-02 NOTE — Progress Notes (Addendum)
Chief Complaint:   OBESITY Shelley Ryan is here to discuss her progress with her obesity treatment plan along with follow-up of her obesity related diagnoses. Shelley Ryan is on the Category 2 Plan + 100 calories and states she is following her eating plan approximately 70% of the time. Shelley Ryan states she is walking for 20 minutes 4-5 times per week.  Today's visit was #: 48 Starting weight: 242 lbs Starting date: 07/02/2019 Today's weight: 224 lbs Today's date: 11/02/2021 Total lbs lost to date: 18 Total lbs lost since last in-office visit: 2  Interim History:  Shelley Ryan endorses polyphagia in the late afternoon and evenings.   Polyphagia is present around 1630, 2100 each day. She will choose high protein snacks, 200 cal with 20 grams of protein.   She is taking metformin 500 mg at lunch and dinner.  Subjective:   1. Pre-diabetes Ozempic caused abdominal pain, therefore stopped GLP-1 and started metformin.   Currently on metformin 500 mg twice daily with meals. Of note- tolerated Victoza 1.8mg  without SE.  2. Bilateral lower extremity edema At last OV she was experiencing bil lower extremity edema. She took 7-day course of hydrochlorothiazide 12.5 mg daily.   She was at 6.4 pounds of water weight in the last 4 weeks. Barrie denies chest pain or dyspnea.    3. Vitamin D deficiency Shelley Ryan is on Ergocalciferol, and she is tolerating it well.  4. Essential hypertension Shelley Ryan was previously on losartan, but it was discontinued due to hypotension.  5. At risk for diabetes mellitus Shelley Ryan is at higher than average risk for developing diabetes due to her insulin resistance and obesity.  Assessment/Plan:   1. Pre-diabetes We will check labs today.  Shelley Ryan will continue to take metformin 500 mg at lunch and at 1630 snack time, and we will refill for 1 month.   - metFORMIN (GLUCOPHAGE) 500 MG tablet; 1 tab at lunch and 1 tab at 1630 snack time  Dispense: 60 tablet; Refill: 0 - Hemoglobin A1c -  Insulin, random - Vitamin B12  2. Bilateral lower extremity edema We will check labs today, and we will follow-up at Shelley Ryan's nest visit.   3. Vitamin D deficiency We will check labs today, then MyChart the patient with results and we will refill Ergo as appropriate.  - VITAMIN D 25 Hydroxy (Vit-D Deficiency, Fractures)  4. Essential hypertension Monitor BP closely. - Comprehensive metabolic panel  5. At risk for diabetes mellitus Shelley Ryan was given approximately 15 minutes of diabetic education and counseling today. We discussed intensive lifestyle modifications today with an emphasis on weight loss as well as increasing exercise and decreasing simple carbohydrates in her diet. We also reviewed medication options with an emphasis on risk versus benefits of those discussed.  Repetitive spaced learning was employed today to elicit superior memory formation and behavioral change.  6. Obesity, Current BMI 35.1 Shelley Ryan is currently in the action stage of change. As such, her goal is to continue with weight loss efforts. She has agreed to the Category 2 Plan + 100 calories.   Exercise goals: As is.  Behavioral modification strategies: increasing lean protein intake, decreasing simple carbohydrates, meal planning and cooking strategies, keeping healthy foods in the home, and planning for success.  Shelley Ryan has agreed to follow-up with our clinic in 3 to 4 weeks. She was informed of the importance of frequent follow-up visits to maximize her success with intensive lifestyle modifications for her multiple health conditions.   Shelley Ryan was informed we would  discuss her lab results at her next visit unless there is a critical issue that needs to be addressed sooner. Shelley Ryan agreed to keep her next visit at the agreed upon time to discuss these results.  Objective:   Blood pressure 138/84, pulse 85, temperature 98 F (36.7 C), height 5\' 7"  (1.702 m), weight 224 lb (101.6 kg), SpO2 97 %. Body mass index is  35.08 kg/m.  General: Cooperative, alert, well developed, in no acute distress. HEENT: Conjunctivae and lids unremarkable. Cardiovascular: Regular rhythm.  Lungs: Normal work of breathing. Neurologic: No focal deficits.   Lab Results  Component Value Date   CREATININE 0.95 07/29/2021   BUN 13 07/29/2021   NA 140 07/29/2021   K 4.1 07/29/2021   CL 105 07/29/2021   CO2 21 07/29/2021   Lab Results  Component Value Date   ALT 19 07/29/2021   AST 16 07/29/2021   ALKPHOS 84 07/29/2021   BILITOT 0.4 07/29/2021   Lab Results  Component Value Date   HGBA1C 5.7 (H) 06/25/2021   HGBA1C 5.8 (H) 03/23/2021   HGBA1C 5.7 (H) 10/13/2020   HGBA1C 5.8 (H) 05/27/2020   HGBA1C 5.9 (H) 02/04/2020   Lab Results  Component Value Date   INSULIN 18.2 06/25/2021   INSULIN 17.5 03/23/2021   INSULIN 10.9 10/13/2020   INSULIN 11.4 05/27/2020   INSULIN 10.9 02/04/2020   Lab Results  Component Value Date   TSH 3.060 07/02/2019   Lab Results  Component Value Date   CHOL 132 03/23/2021   HDL 48 03/23/2021   LDLCALC 66 03/23/2021   TRIG 94 03/23/2021   CHOLHDL 2.8 03/23/2021   Lab Results  Component Value Date   VD25OH 55.2 06/25/2021   VD25OH 56.7 03/23/2021   VD25OH 43.2 10/13/2020   Lab Results  Component Value Date   WBC 12.1 (H) 08/29/2019   HGB 13.6 08/29/2019   HCT 41.3 08/29/2019   MCV 91.0 08/29/2019   PLT 211 08/29/2019   No results found for: "IRON", "TIBC", "FERRITIN"  Attestation Statements:   Reviewed by clinician on day of visit: allergies, medications, problem list, medical history, surgical history, family history, social history, and previous encounter notes.   10/29/2019, am acting as transcriptionist for Trude Mcburney, NP.  I have reviewed the above documentation for accuracy and completeness, and I agree with the above. -  Jaelle Campanile d. Phillis Thackeray, NP-C

## 2021-11-03 ENCOUNTER — Encounter (INDEPENDENT_AMBULATORY_CARE_PROVIDER_SITE_OTHER): Payer: Self-pay | Admitting: Adult Health

## 2021-11-03 ENCOUNTER — Other Ambulatory Visit (INDEPENDENT_AMBULATORY_CARE_PROVIDER_SITE_OTHER): Payer: Self-pay | Admitting: Adult Health

## 2021-11-03 DIAGNOSIS — E559 Vitamin D deficiency, unspecified: Secondary | ICD-10-CM

## 2021-11-03 LAB — VITAMIN B12: Vitamin B-12: 494 pg/mL (ref 232–1245)

## 2021-11-03 LAB — VITAMIN D 25 HYDROXY (VIT D DEFICIENCY, FRACTURES): Vit D, 25-Hydroxy: 48 ng/mL (ref 30.0–100.0)

## 2021-11-03 LAB — COMPREHENSIVE METABOLIC PANEL
ALT: 15 IU/L (ref 0–32)
AST: 19 IU/L (ref 0–40)
Albumin/Globulin Ratio: 1.5 (ref 1.2–2.2)
Albumin: 4.3 g/dL (ref 3.9–4.9)
Alkaline Phosphatase: 92 IU/L (ref 44–121)
BUN/Creatinine Ratio: 19 (ref 9–23)
BUN: 16 mg/dL (ref 6–24)
Bilirubin Total: 0.3 mg/dL (ref 0.0–1.2)
CO2: 19 mmol/L — ABNORMAL LOW (ref 20–29)
Calcium: 9.2 mg/dL (ref 8.7–10.2)
Chloride: 105 mmol/L (ref 96–106)
Creatinine, Ser: 0.85 mg/dL (ref 0.57–1.00)
Globulin, Total: 2.9 g/dL (ref 1.5–4.5)
Glucose: 93 mg/dL (ref 70–99)
Potassium: 4.1 mmol/L (ref 3.5–5.2)
Sodium: 139 mmol/L (ref 134–144)
Total Protein: 7.2 g/dL (ref 6.0–8.5)
eGFR: 85 mL/min/{1.73_m2} (ref >59)

## 2021-11-03 LAB — INSULIN, RANDOM: INSULIN: 9.4 u[IU]/mL (ref 2.6–24.9)

## 2021-11-03 LAB — HEMOGLOBIN A1C
Est. average glucose Bld gHb Est-mCnc: 126 mg/dL
Hgb A1c MFr Bld: 6 % — ABNORMAL HIGH (ref 4.8–5.6)

## 2021-11-03 MED ORDER — VITAMIN D (ERGOCALCIFEROL) 1.25 MG (50000 UNIT) PO CAPS: 50000.0000 [IU] | ORAL_CAPSULE | ORAL | 0 refills | Status: DC

## 2021-12-02 ENCOUNTER — Encounter (INDEPENDENT_AMBULATORY_CARE_PROVIDER_SITE_OTHER): Payer: Self-pay

## 2021-12-02 ENCOUNTER — Encounter (INDEPENDENT_AMBULATORY_CARE_PROVIDER_SITE_OTHER): Payer: Self-pay | Admitting: Adult Health

## 2021-12-02 ENCOUNTER — Other Ambulatory Visit: Payer: Self-pay | Admitting: Family Medicine

## 2021-12-02 ENCOUNTER — Ambulatory Visit (INDEPENDENT_AMBULATORY_CARE_PROVIDER_SITE_OTHER): Payer: BC Managed Care – PPO | Admitting: Adult Health

## 2021-12-02 VITALS — BP 124/84 | HR 93 | Temp 98.2°F | Ht 67.0 in | Wt 228.0 lb

## 2021-12-02 DIAGNOSIS — E669 Obesity, unspecified: Secondary | ICD-10-CM

## 2021-12-02 DIAGNOSIS — E559 Vitamin D deficiency, unspecified: Secondary | ICD-10-CM | POA: Diagnosis not present

## 2021-12-02 DIAGNOSIS — R7303 Prediabetes: Secondary | ICD-10-CM | POA: Diagnosis not present

## 2021-12-02 DIAGNOSIS — Z6835 Body mass index (BMI) 35.0-35.9, adult: Secondary | ICD-10-CM | POA: Diagnosis not present

## 2021-12-02 DIAGNOSIS — Z1231 Encounter for screening mammogram for malignant neoplasm of breast: Secondary | ICD-10-CM

## 2021-12-02 DIAGNOSIS — Z9189 Other specified personal risk factors, not elsewhere classified: Secondary | ICD-10-CM

## 2021-12-02 MED ORDER — INSULIN PEN NEEDLE 32G X 4 MM MISC: 1.0000 [IU] | Freq: Two times a day (BID) | 0 refills | Status: DC

## 2021-12-02 MED ORDER — SAXENDA 18 MG/3ML ~~LOC~~ SOPN: 0.6000 mg | PEN_INJECTOR | Freq: Every day | SUBCUTANEOUS | 0 refills | Status: DC

## 2021-12-07 ENCOUNTER — Encounter (INDEPENDENT_AMBULATORY_CARE_PROVIDER_SITE_OTHER): Payer: Self-pay

## 2021-12-07 ENCOUNTER — Telehealth (INDEPENDENT_AMBULATORY_CARE_PROVIDER_SITE_OTHER): Payer: Self-pay | Admitting: Adult Health

## 2021-12-07 NOTE — Telephone Encounter (Signed)
Shelley Ryan - Prior authorization approved for Saxenda. Effective: 12/03/2021 - 04/04/2022. Patient sent approval message via mychart.

## 2021-12-08 NOTE — Progress Notes (Signed)
Chief Complaint:   OBESITY Shelley Ryan is here to discuss her progress with her obesity treatment plan along with follow-up of her obesity related diagnoses. Shelley Ryan is on the Category 2 Plan +100 calories and states she is following her eating plan approximately 70% of the time. Shelley Ryan states she is walking 20 minutes 4 times per week.  Today's visit was #: 49 Starting weight: 242 lbs Starting date: 07/02/2019 Today's weight: 228 lbs Today's date: 12/02/2021 Total lbs lost to date: 14 lbs Total lbs lost since last in-office visit: 0  Interim History: D She recently travelled to [place] for a week long mission trip. Shelley Ryan consumed more CHO while in [place].   Subjective:   1. Vitamin D deficiency Labs discussed during visit today.  11/02/21 her Vit D level of 48.0. Shelley Ryan is on weekly Ergocalciferol.  She is also on over the counter daily multivitamin.   2. Prediabetes Labs discussed during visit today.  11/02/21 blood glucose 93, A1c 6.0, insulin 9.4, CMP GFR>60 and B12 494. [name] is currently on Metformin 500 mg twice with meals. She denies GI upset with Metformin therapy.  3. At risk for constipation Shelley Ryan is at increased risk for constipation due to inadequate water intake, changes in diet, and/or use of medications such as GLP1 agonists. Shelley Ryan denies hard, infrequent stools currently.   Assessment/Plan:   1. Vitamin D deficiency Rumi will continue with current Vit D supplementation.  2. Prediabetes Restart Saxenda 0.6 mg SubQ daily for 2 weeks, then increase 1.2 mg SubQ daily--hold at this dose. When on Saxenda 1.2 mg, decrease Metformin to 500 mg daily.  -Restart Liraglutide -Weight Management (SAXENDA) 18 MG/3ML SOPN; Inject 0.6 mg into the skin daily.  Dispense: 3 mL; Refill: 0  - Insulin Pen Needle 32G X 4 MM MISC; 1 Units by Does not apply route in the morning and at bedtime.  Dispense: 100 each; Refill: 0  3. At risk for constipation Shelley Ryan was given approximately  15 minutes of counseling today regarding prevention of constipation. She was encouraged to increase water and fiber intake.    4. Obesity, Current BMI 35.7 Shelley Ryan is currently in the action stage of change. As such, her goal is to continue with weight loss efforts. She has agreed to the Category 2 Plan +100.  Exercise goals: As is.  Behavioral modification strategies: increasing lean protein intake, decreasing simple carbohydrates, meal planning and cooking strategies, keeping healthy foods in the home, and planning for success.  Shelley Ryan has agreed to follow-up with our clinic in 4 weeks. She was informed of the importance of frequent follow-up visits to maximize her success with intensive lifestyle modifications for her multiple health conditions.   Objective:   Blood pressure 124/84, pulse 93, temperature 98.2 F (36.8 C), height 5\' 7"  (1.702 m), weight 228 lb (103.4 kg), SpO2 95 %. Body mass index is 35.71 kg/m.  General: Cooperative, alert, well developed, in no acute distress. HEENT: Conjunctivae and lids unremarkable. Cardiovascular: Regular rhythm.  Lungs: Normal work of breathing. Neurologic: No focal deficits.   Lab Results  Component Value Date   CREATININE 0.85 11/02/2021   BUN 16 11/02/2021   NA 139 11/02/2021   K 4.1 11/02/2021   CL 105 11/02/2021   CO2 19 (L) 11/02/2021   Lab Results  Component Value Date   ALT 15 11/02/2021   AST 19 11/02/2021   ALKPHOS 92 11/02/2021   BILITOT 0.3 11/02/2021   Lab Results  Component Value Date  HGBA1C 6.0 (H) 11/02/2021   HGBA1C 5.7 (H) 06/25/2021   HGBA1C 5.8 (H) 03/23/2021   HGBA1C 5.7 (H) 10/13/2020   HGBA1C 5.8 (H) 05/27/2020   Lab Results  Component Value Date   INSULIN 9.4 11/02/2021   INSULIN 18.2 06/25/2021   INSULIN 17.5 03/23/2021   INSULIN 10.9 10/13/2020   INSULIN 11.4 05/27/2020   Lab Results  Component Value Date   TSH 3.060 07/02/2019   Lab Results  Component Value Date   CHOL 132 03/23/2021    HDL 48 03/23/2021   LDLCALC 66 03/23/2021   TRIG 94 03/23/2021   CHOLHDL 2.8 03/23/2021   Lab Results  Component Value Date   VD25OH 48.0 11/02/2021   VD25OH 55.2 06/25/2021   VD25OH 56.7 03/23/2021   Lab Results  Component Value Date   WBC 12.1 (H) 08/29/2019   HGB 13.6 08/29/2019   HCT 41.3 08/29/2019   MCV 91.0 08/29/2019   PLT 211 08/29/2019   No results found for: "IRON", "TIBC", "FERRITIN"  Attestation Statements:   Reviewed by clinician on day of visit: allergies, medications, problem list, medical history, surgical history, family history, social history, and previous encounter notes.  I, Brendell Tyus, RMA, am acting as transcriptionist for William Hamburger, NP.  I have reviewed the above documentation for accuracy and completeness, and I agree with the above. -  Shyne Lehrke d. Tina Temme, NP-C

## 2021-12-15 ENCOUNTER — Encounter (INDEPENDENT_AMBULATORY_CARE_PROVIDER_SITE_OTHER): Payer: Self-pay | Admitting: Adult Health

## 2021-12-16 ENCOUNTER — Other Ambulatory Visit (INDEPENDENT_AMBULATORY_CARE_PROVIDER_SITE_OTHER): Payer: Self-pay | Admitting: Adult Health

## 2021-12-16 ENCOUNTER — Encounter (INDEPENDENT_AMBULATORY_CARE_PROVIDER_SITE_OTHER): Payer: Self-pay | Admitting: Adult Health

## 2021-12-16 DIAGNOSIS — R7303 Prediabetes: Secondary | ICD-10-CM

## 2021-12-22 ENCOUNTER — Ambulatory Visit: Payer: BC Managed Care – PPO

## 2021-12-24 ENCOUNTER — Ambulatory Visit (INDEPENDENT_AMBULATORY_CARE_PROVIDER_SITE_OTHER): Payer: BC Managed Care – PPO | Admitting: Adult Health

## 2021-12-24 ENCOUNTER — Other Ambulatory Visit (INDEPENDENT_AMBULATORY_CARE_PROVIDER_SITE_OTHER): Payer: Self-pay | Admitting: Adult Health

## 2021-12-24 ENCOUNTER — Encounter (INDEPENDENT_AMBULATORY_CARE_PROVIDER_SITE_OTHER): Payer: Self-pay | Admitting: Adult Health

## 2021-12-24 VITALS — BP 133/82 | HR 66 | Temp 97.8°F | Ht 67.0 in | Wt 228.0 lb

## 2021-12-24 DIAGNOSIS — E669 Obesity, unspecified: Secondary | ICD-10-CM | POA: Diagnosis not present

## 2021-12-24 DIAGNOSIS — Z6835 Body mass index (BMI) 35.0-35.9, adult: Secondary | ICD-10-CM | POA: Diagnosis not present

## 2021-12-24 DIAGNOSIS — E7849 Other hyperlipidemia: Secondary | ICD-10-CM

## 2021-12-24 DIAGNOSIS — R7303 Prediabetes: Secondary | ICD-10-CM

## 2021-12-24 DIAGNOSIS — E782 Mixed hyperlipidemia: Secondary | ICD-10-CM

## 2021-12-24 DIAGNOSIS — Z9189 Other specified personal risk factors, not elsewhere classified: Secondary | ICD-10-CM

## 2021-12-24 MED ORDER — ROSUVASTATIN CALCIUM 5 MG PO TABS: ORAL_TABLET | ORAL | 0 refills | Status: DC

## 2021-12-24 MED ORDER — METFORMIN HCL 500 MG PO TABS: ORAL_TABLET | ORAL | 0 refills | Status: DC

## 2021-12-27 NOTE — Progress Notes (Signed)
Chief Complaint:   OBESITY Fayola is here to discuss her progress with her obesity treatment plan along with follow-up of her obesity related diagnoses. Eily is on the Category 2 Plan +100 calories and states she is following her eating plan approximately 80% of the time. Renatha states she is walking, exercise class 10-45  minutes 1-3 times per week.  Today's visit was #: 50 Starting weight: 242 lbs Starting date: 07/02/2019 Today's weight: 228 lbs Today's date: 12/24/2021 Total lbs lost to date: 14 lbs Total lbs lost since last in-office visit: 0  Interim History:  12/02/2021-Prescription for Saxenda was provided at last office visit.    She has been unable to fill prescription due to national drug shortage.   Exercise:  Brisk walking 3 times weekly and she also instructs a geriatric strengthening class.   Subjective:   1. Other hyperlipidemia Currently taking Crestor 5 mg daily.  Denies myalgias.   2. Pre-diabetes Ms. Heinrichs is now taking metformin at lunch and at 1630 snack.  She is also consuming Dinner later. These tactics have all decreased polyphagia.   3. At risk for diabetes mellitus Dhruvi has a diagnosis of prediabetes based on her elevated HgA1c and was informed this puts her at greater risk of developing diabetes. She continues to work on diet and exercise to decrease her risk of diabetes. She denies nausea or hypoglycemia.  She is at risk for prediabetes and obesity.   Assessment/Plan:   1. Other hyperlipidemia Refill - rosuvastatin (CRESTOR) 5 MG tablet; One tablet by mouth daily  Dispense: 90 tablet; Refill: 0  2. Pre-diabetes Refill - metFORMIN (GLUCOPHAGE) 500 MG tablet; 1 tab at lunch and 1 tab at 1630 snack time  Dispense: 60 tablet; Refill: 0  3. At risk for diabetes mellitus Rosebud will continue to work on weight loss, exercise, and decreasing simple carbohydrates to help decrease the risk of diabetes.    4. Obesity, Current BMI 35.8 Handout:  Eating  out guide.   Ciarah is currently in the action stage of change. As such, her goal is to continue with weight loss efforts. She has agreed to the Category 2 Plan +100 calories.   Exercise goals:  As is.   Behavioral modification strategies: increasing lean protein intake, decreasing simple carbohydrates, meal planning and cooking strategies, keeping healthy foods in the home, and planning for success.  Neda has agreed to follow-up with our clinic in 4 weeks. She was informed of the importance of frequent follow-up visits to maximize her success with intensive lifestyle modifications for her multiple health conditions.   Objective:   Blood pressure 133/82, pulse 66, temperature 97.8 F (36.6 C), height 5\' 7"  (1.702 m), weight 228 lb (103.4 kg), SpO2 96 %. Body mass index is 35.71 kg/m.  General: Cooperative, alert, well developed, in no acute distress. HEENT: Conjunctivae and lids unremarkable. Cardiovascular: Regular rhythm.  Lungs: Normal work of breathing. Neurologic: No focal deficits.   Lab Results  Component Value Date   CREATININE 0.85 11/02/2021   BUN 16 11/02/2021   NA 139 11/02/2021   K 4.1 11/02/2021   CL 105 11/02/2021   CO2 19 (L) 11/02/2021   Lab Results  Component Value Date   ALT 15 11/02/2021   AST 19 11/02/2021   ALKPHOS 92 11/02/2021   BILITOT 0.3 11/02/2021   Lab Results  Component Value Date   HGBA1C 6.0 (H) 11/02/2021   HGBA1C 5.7 (H) 06/25/2021   HGBA1C 5.8 (H) 03/23/2021  HGBA1C 5.7 (H) 10/13/2020   HGBA1C 5.8 (H) 05/27/2020   Lab Results  Component Value Date   INSULIN 9.4 11/02/2021   INSULIN 18.2 06/25/2021   INSULIN 17.5 03/23/2021   INSULIN 10.9 10/13/2020   INSULIN 11.4 05/27/2020   Lab Results  Component Value Date   TSH 3.060 07/02/2019   Lab Results  Component Value Date   CHOL 132 03/23/2021   HDL 48 03/23/2021   LDLCALC 66 03/23/2021   TRIG 94 03/23/2021   CHOLHDL 2.8 03/23/2021   Lab Results  Component Value Date    VD25OH 48.0 11/02/2021   VD25OH 55.2 06/25/2021   VD25OH 56.7 03/23/2021   Lab Results  Component Value Date   WBC 12.1 (H) 08/29/2019   HGB 13.6 08/29/2019   HCT 41.3 08/29/2019   MCV 91.0 08/29/2019   PLT 211 08/29/2019   No results found for: "IRON", "TIBC", "FERRITIN"  Attestation Statements:   Reviewed by clinician on day of visit: allergies, medications, problem list, medical history, surgical history, family history, social history, and previous encounter notes.  I, Malcolm Metro, RMA, am acting as Energy manager for William Hamburger, NP.  I have reviewed the above documentation for accuracy and completeness, and I agree with the above. -  Chiara Coltrin d. Jissel Slavens, NP-C

## 2022-01-05 ENCOUNTER — Ambulatory Visit
Admission: RE | Admit: 2022-01-05 | Discharge: 2022-01-05 | Disposition: A | Discharge status: Home / Self Care | Payer: BC Managed Care – PPO | Source: Ambulatory Visit | Attending: Family Medicine | Admitting: Family Medicine

## 2022-01-05 DIAGNOSIS — Z1231 Encounter for screening mammogram for malignant neoplasm of breast: Secondary | ICD-10-CM

## 2022-01-21 ENCOUNTER — Ambulatory Visit (INDEPENDENT_AMBULATORY_CARE_PROVIDER_SITE_OTHER): Payer: BC Managed Care – PPO | Admitting: Adult Health

## 2022-01-21 ENCOUNTER — Encounter (INDEPENDENT_AMBULATORY_CARE_PROVIDER_SITE_OTHER): Payer: Self-pay | Admitting: Adult Health

## 2022-01-21 ENCOUNTER — Other Ambulatory Visit (INDEPENDENT_AMBULATORY_CARE_PROVIDER_SITE_OTHER): Payer: Self-pay | Admitting: Adult Health

## 2022-01-21 VITALS — BP 147/79 | HR 75 | Temp 98.2°F | Ht 67.0 in | Wt 231.0 lb

## 2022-01-21 DIAGNOSIS — Z9189 Other specified personal risk factors, not elsewhere classified: Secondary | ICD-10-CM

## 2022-01-21 DIAGNOSIS — Z6836 Body mass index (BMI) 36.0-36.9, adult: Secondary | ICD-10-CM

## 2022-01-21 DIAGNOSIS — R7303 Prediabetes: Secondary | ICD-10-CM | POA: Diagnosis not present

## 2022-01-21 DIAGNOSIS — I10 Essential (primary) hypertension: Secondary | ICD-10-CM | POA: Diagnosis not present

## 2022-01-21 DIAGNOSIS — E669 Obesity, unspecified: Secondary | ICD-10-CM | POA: Diagnosis not present

## 2022-01-21 DIAGNOSIS — G4733 Obstructive sleep apnea (adult) (pediatric): Secondary | ICD-10-CM | POA: Diagnosis not present

## 2022-01-21 MED ORDER — METFORMIN HCL 500 MG PO TABS: ORAL_TABLET | ORAL | 0 refills | Status: DC

## 2022-01-21 MED ORDER — LISINOPRIL 5 MG PO TABS: 5.0000 mg | ORAL_TABLET | Freq: Every day | ORAL | 0 refills | Status: DC

## 2022-01-22 NOTE — Progress Notes (Unsigned)
Chief Complaint:   OBESITY Shelley Ryan is here to discuss her progress with her obesity treatment plan along with follow-up of her obesity related diagnoses. [name] is on the Category 2 Plan + 100 calories and states she is following her eating plan approximately 90% of the time. [name] states she is not exercising.   Today's visit was #: 51 Starting weight: 242 lbs Starting date: 07/02/2019 Today's weight: 231 lbs Today's date: 01/20/2022 Total lbs lost to date: 11 lbs Total lbs lost since last in-office visit: +3 lbs  Interim History: She has been waking up with leg cramps, 0300 last 3 weeks.  She has had a recent increase in weight, positive for snoring.    Subjective:   1. Essential hypertension She was previously on lisinopril, discontinued with blood pressure trending down and developed symptoms of hypotension.  She denies acute cardiac ***  2. Pre-diabetes A1c *** Unable to find supply of Saxenda.  She has remained on metformin 500 mg.    3. OSA (obstructive sleep apnea) 3 weeks ago her husband has told her that her snoring had worsened significantly. He now sleeps in another room.  She denies excessive daytime drowsiness.  Has has S.S. in the past, positive for OSA, treated with CPAP.  Last CPAP use >1 year.    4. At risk for heart disease Shelley Ryan is at higher than average risk for cardiovascular disease due to obesity, hypertension, hyperlipidemia.   Assessment/Plan:   1. Essential hypertension Restart - lisinopril (ZESTRIL) 5 MG tablet; Take 1 tablet (5 mg total) by mouth daily.  Dispense: 30 tablet; Refill: 0  2. Pre-diabetes Refill - metFORMIN (GLUCOPHAGE) 500 MG tablet; 1 tab at lunch and 1 tab at 1630 snack time  Dispense: 60 tablet; Refill: 0  3. OSA (obstructive sleep apnea) Refer to [facility].  She is positive for hypertension and weight gain.  - Nocturnal polysomnography (NPSG); Future  4. At risk for heart disease Shelley Ryan was given approximately 15  minutes of coronary artery disease prevention counseling today. She is 46 y.o. female and has risk factors for heart disease including obesity. We discussed intensive lifestyle modifications today with an emphasis on specific weight loss instructions and strategies.  Repetitive spaced learning was employed today to elicit superior memory formation and behavioral change.    5. Obesity, Current BMI 36.2 Check IC at next visit. Pt aware to arrive 30 minutes early.   Shelley Ryan is currently in the action stage of change. As such, her goal is to continue with weight loss efforts. She has agreed to keeping a food journal and adhering to recommended goals of 1200-1300 calories and 90-100 protein.   Exercise goals:  As is.   Behavioral modification strategies: increasing lean protein intake, decreasing simple carbohydrates, keeping healthy foods in the home, ways to avoid boredom eating, and planning for success.  Shelley Ryan has agreed to follow-up with our clinic in 2-3 weeks. She was informed of the importance of frequent follow-up visits to maximize her success with intensive lifestyle modifications for her multiple health conditions.   Objective:   Blood pressure (!) 147/79, pulse 75, temperature 98.2 F (36.8 C), height 5\' 7"  (1.702 m), weight 231 lb (104.8 kg), SpO2 97 %. Body mass index is 36.18 kg/m.  General: Cooperative, alert, well developed, in no acute distress. HEENT: Conjunctivae and lids unremarkable. Cardiovascular: Regular rhythm.  Lungs: Normal work of breathing. Neurologic: No focal deficits.   Lab Results  Component Value Date  CREATININE 0.85 11/02/2021   BUN 16 11/02/2021   NA 139 11/02/2021   K 4.1 11/02/2021   CL 105 11/02/2021   CO2 19 (L) 11/02/2021   Lab Results  Component Value Date   ALT 15 11/02/2021   AST 19 11/02/2021   ALKPHOS 92 11/02/2021   BILITOT 0.3 11/02/2021   Lab Results  Component Value Date   HGBA1C 6.0 (H) 11/02/2021   HGBA1C 5.7 (H)  06/25/2021   HGBA1C 5.8 (H) 03/23/2021   HGBA1C 5.7 (H) 10/13/2020   HGBA1C 5.8 (H) 05/27/2020   Lab Results  Component Value Date   INSULIN 9.4 11/02/2021   INSULIN 18.2 06/25/2021   INSULIN 17.5 03/23/2021   INSULIN 10.9 10/13/2020   INSULIN 11.4 05/27/2020   Lab Results  Component Value Date   TSH 3.060 07/02/2019   Lab Results  Component Value Date   CHOL 132 03/23/2021   HDL 48 03/23/2021   LDLCALC 66 03/23/2021   TRIG 94 03/23/2021   CHOLHDL 2.8 03/23/2021   Lab Results  Component Value Date   VD25OH 48.0 11/02/2021   VD25OH 55.2 06/25/2021   VD25OH 56.7 03/23/2021   Lab Results  Component Value Date   WBC 12.1 (H) 08/29/2019   HGB 13.6 08/29/2019   HCT 41.3 08/29/2019   MCV 91.0 08/29/2019   PLT 211 08/29/2019   No results found for: "IRON", "TIBC", "FERRITIN"  Attestation Statements:   Reviewed by clinician on day of visit: allergies, medications, problem list, medical history, surgical history, family history, social history, and previous encounter notes.  I, Davy Pique, RMA, am acting as Location manager for Mina Marble, NP.  I have reviewed the above documentation for accuracy and completeness, and I agree with the above. -  ***

## 2022-02-01 ENCOUNTER — Other Ambulatory Visit (HOSPITAL_COMMUNITY): Payer: Self-pay

## 2022-02-01 ENCOUNTER — Ambulatory Visit (INDEPENDENT_AMBULATORY_CARE_PROVIDER_SITE_OTHER): Payer: BC Managed Care – PPO | Admitting: Adult Health

## 2022-02-01 ENCOUNTER — Encounter (INDEPENDENT_AMBULATORY_CARE_PROVIDER_SITE_OTHER): Payer: Self-pay | Admitting: Adult Health

## 2022-02-01 VITALS — BP 125/84 | HR 78 | Temp 98.0°F | Ht 67.0 in | Wt 230.0 lb

## 2022-02-01 DIAGNOSIS — R7303 Prediabetes: Secondary | ICD-10-CM

## 2022-02-01 DIAGNOSIS — E559 Vitamin D deficiency, unspecified: Secondary | ICD-10-CM | POA: Diagnosis not present

## 2022-02-01 DIAGNOSIS — I10 Essential (primary) hypertension: Secondary | ICD-10-CM | POA: Diagnosis not present

## 2022-02-01 DIAGNOSIS — Z6839 Body mass index (BMI) 39.0-39.9, adult: Secondary | ICD-10-CM

## 2022-02-01 DIAGNOSIS — Z6836 Body mass index (BMI) 36.0-36.9, adult: Secondary | ICD-10-CM

## 2022-02-01 DIAGNOSIS — E669 Obesity, unspecified: Secondary | ICD-10-CM

## 2022-02-01 MED ORDER — LISINOPRIL 5 MG PO TABS: 5.0000 mg | ORAL_TABLET | Freq: Every day | ORAL | 0 refills | Status: DC

## 2022-02-01 MED ORDER — VITAMIN D (ERGOCALCIFEROL) 1.25 MG (50000 UNIT) PO CAPS: 50000.0000 [IU] | ORAL_CAPSULE | ORAL | 0 refills | Status: DC

## 2022-02-01 MED ORDER — METFORMIN HCL 500 MG PO TABS: ORAL_TABLET | ORAL | 0 refills | Status: DC

## 2022-02-01 MED ORDER — SAXENDA 18 MG/3ML ~~LOC~~ SOPN: 0.6000 mg | PEN_INJECTOR | Freq: Every day | SUBCUTANEOUS | 0 refills | Status: DC

## 2022-02-01 MED ORDER — SAXENDA 18 MG/3ML ~~LOC~~ SOPN
0.6000 mg | PEN_INJECTOR | Freq: Every day | SUBCUTANEOUS | 0 refills | Status: DC
  Filled 2022-02-01: qty 3, 30d supply, fill #0

## 2022-02-02 ENCOUNTER — Other Ambulatory Visit (HOSPITAL_COMMUNITY): Payer: Self-pay

## 2022-02-08 NOTE — Progress Notes (Unsigned)
Chief Complaint:   OBESITY Shelley Ryan is here to discuss her progress with her obesity treatment plan along with follow-up of her obesity related diagnoses. Shelley Ryan is on keeping a food journal and adhering to recommended goals of 1200-1300 calories and 90-100 protein and states she is following her eating plan approximately 90% of the time. Shelley Ryan states she is walking and exercise classes 10-60 minutes 1-3 times per week.  Today's visit was #: 52 Starting weight: 242 lbs Starting date: 07/02/2019 Today's weight: 230 lbs Today's date: 02/01/2022 Total lbs lost to date: 12 lbs Total lbs lost since last in-office visit: 1 lb  Interim History: We were unable to obtain IC at office visit today.  Bioimpedance reflects muscle mass +1 lb, adipose mass -2.4 lbs.   Subjective:   1. Essential hypertension Restarted lisinopril 5 mg at last office visit due to increased blood pressure.  Blood pressure at goal today.  Home readings SBP 110-115, DBP 70  2. Vitamin D deficiency 11/02/2021, Vitamin D level 48.0.  3. Prediabetes A1c *** Still unable to find Saxenda 0.6 mg daily.  She has remained on metformin 500 mg BID.     Assessment/Plan:   1. Essential hypertension Refill - lisinopril (ZESTRIL) 5 MG tablet; Take 1 tablet (5 mg total) by mouth daily.  Dispense: 90 tablet; Refill: 0  2. Vitamin D deficiency Refill - Vitamin D, Ergocalciferol, (DRISDOL) 1.25 MG (50000 UNIT) CAPS capsule; Take 1 capsule (50,000 Units total) by mouth every 7 (seven) days.  Dispense: 12 capsule; Refill: 0  3. Prediabetes Refill - metFORMIN (GLUCOPHAGE) 500 MG tablet; 1 tab at lunch and 1 tab at 1630 snack time  Dispense: 60 tablet; Refill: 0  Refill - Liraglutide -Weight Management (SAXENDA) 18 MG/3ML SOPN; Inject 0.6 mg into the skin daily for one week, then increase to 1.2mg  and hold at this dose.  Dispense: 3 mL; Refill: 0  4. Obesity, Current BMI 36.0 Check IC at next office visit.  Pt is aware to arrive  30 minutes prior to office visit appointment time.  Refill - Liraglutide -Weight Management (SAXENDA) 18 MG/3ML SOPN; Inject 0.6 mg into the skin daily for one week, then increase to 1.2mg  and hold at this dose.  Dispense: 3 mL; Refill: 0  Shelley Ryan is currently in the action stage of change. As such, her goal is to continue with weight loss efforts. She has agreed to the Category 2 Plan +100 calories.   Exercise goals:  As is.   Behavioral modification strategies: increasing lean protein intake, decreasing simple carbohydrates, meal planning and cooking strategies, keeping healthy foods in the home, and planning for success.  Shelley Ryan has agreed to follow-up with our clinic in 2 weeks. She was informed of the importance of frequent follow-up visits to maximize her success with intensive lifestyle modifications for her multiple health conditions.   Objective:   Blood pressure 125/84, pulse 78, temperature 98 F (36.7 C), height 5\' 7"  (1.702 m), weight 230 lb (104.3 kg), SpO2 98 %. Body mass index is 36.02 kg/m.  General: Cooperative, alert, well developed, in no acute distress. HEENT: Conjunctivae and lids unremarkable. Cardiovascular: Regular rhythm.  Lungs: Normal work of breathing. Neurologic: No focal deficits.   Lab Results  Component Value Date   CREATININE 0.85 11/02/2021   BUN 16 11/02/2021   NA 139 11/02/2021   K 4.1 11/02/2021   CL 105 11/02/2021   CO2 19 (L) 11/02/2021   Lab Results  Component Value Date  ALT 15 11/02/2021   AST 19 11/02/2021   ALKPHOS 92 11/02/2021   BILITOT 0.3 11/02/2021   Lab Results  Component Value Date   HGBA1C 6.0 (H) 11/02/2021   HGBA1C 5.7 (H) 06/25/2021   HGBA1C 5.8 (H) 03/23/2021   HGBA1C 5.7 (H) 10/13/2020   HGBA1C 5.8 (H) 05/27/2020   Lab Results  Component Value Date   INSULIN 9.4 11/02/2021   INSULIN 18.2 06/25/2021   INSULIN 17.5 03/23/2021   INSULIN 10.9 10/13/2020   INSULIN 11.4 05/27/2020   Lab Results  Component  Value Date   TSH 3.060 07/02/2019   Lab Results  Component Value Date   CHOL 132 03/23/2021   HDL 48 03/23/2021   LDLCALC 66 03/23/2021   TRIG 94 03/23/2021   CHOLHDL 2.8 03/23/2021   Lab Results  Component Value Date   VD25OH 48.0 11/02/2021   VD25OH 55.2 06/25/2021   VD25OH 56.7 03/23/2021   Lab Results  Component Value Date   WBC 12.1 (H) 08/29/2019   HGB 13.6 08/29/2019   HCT 41.3 08/29/2019   MCV 91.0 08/29/2019   PLT 211 08/29/2019   No results found for: "IRON", "TIBC", "FERRITIN"  Attestation Statements:   Reviewed by clinician on day of visit: allergies, medications, problem list, medical history, surgical history, family history, social history, and previous encounter notes.  I, Davy Pique, RMA, am acting as Location manager for Mina Marble, NP.  I have reviewed the above documentation for accuracy and completeness, and I agree with the above. -  ***

## 2022-02-18 ENCOUNTER — Other Ambulatory Visit (INDEPENDENT_AMBULATORY_CARE_PROVIDER_SITE_OTHER): Payer: Self-pay | Admitting: Adult Health

## 2022-02-18 ENCOUNTER — Telehealth (INDEPENDENT_AMBULATORY_CARE_PROVIDER_SITE_OTHER): Payer: Self-pay | Admitting: Adult Health

## 2022-02-18 ENCOUNTER — Encounter (INDEPENDENT_AMBULATORY_CARE_PROVIDER_SITE_OTHER): Payer: Self-pay | Admitting: Adult Health

## 2022-02-18 ENCOUNTER — Other Ambulatory Visit (HOSPITAL_COMMUNITY): Payer: Self-pay

## 2022-02-18 ENCOUNTER — Ambulatory Visit (INDEPENDENT_AMBULATORY_CARE_PROVIDER_SITE_OTHER): Payer: BC Managed Care – PPO | Admitting: Adult Health

## 2022-02-18 VITALS — BP 113/76 | HR 87 | Temp 98.2°F | Ht 67.0 in | Wt 228.0 lb

## 2022-02-18 DIAGNOSIS — R0602 Shortness of breath: Secondary | ICD-10-CM

## 2022-02-18 DIAGNOSIS — R7303 Prediabetes: Secondary | ICD-10-CM

## 2022-02-18 DIAGNOSIS — E669 Obesity, unspecified: Secondary | ICD-10-CM

## 2022-02-18 DIAGNOSIS — Z6835 Body mass index (BMI) 35.0-35.9, adult: Secondary | ICD-10-CM

## 2022-02-18 DIAGNOSIS — E782 Mixed hyperlipidemia: Secondary | ICD-10-CM

## 2022-02-18 DIAGNOSIS — M791 Myalgia, unspecified site: Secondary | ICD-10-CM

## 2022-02-18 DIAGNOSIS — E559 Vitamin D deficiency, unspecified: Secondary | ICD-10-CM

## 2022-02-18 MED ORDER — METFORMIN HCL 500 MG PO TABS: ORAL_TABLET | ORAL | 0 refills | Status: DC

## 2022-02-18 MED ORDER — SAXENDA 18 MG/3ML ~~LOC~~ SOPN
1.2000 mg | PEN_INJECTOR | Freq: Every day | SUBCUTANEOUS | 0 refills | Status: DC
  Filled 2022-02-18 – 2022-02-23 (×2): qty 6, 30d supply, fill #0

## 2022-02-18 NOTE — Telephone Encounter (Signed)
Patient Shelley Ryan daughter Shelley Ryan has a charge that needs to be taking out on 10/12. It is titled as ADMSVCS $25.00. Also, ShelleyPolice would like to speak with a supervisor about her daughter coming to a visit and they turned her around because she was two minutes late before the 5 mins grace period.

## 2022-02-20 LAB — LIPID PANEL
Chol/HDL Ratio: 4 ratio (ref 0.0–4.4)
Cholesterol, Total: 192 mg/dL (ref 100–199)
HDL: 48 mg/dL (ref >39)
LDL Chol Calc (NIH): 120 mg/dL — ABNORMAL HIGH (ref 0–99)
Triglycerides: 133 mg/dL (ref 0–149)
VLDL Cholesterol Cal: 24 mg/dL (ref 5–40)

## 2022-02-20 LAB — COMPREHENSIVE METABOLIC PANEL
ALT: 20 IU/L (ref 0–32)
AST: 17 IU/L (ref 0–40)
Albumin/Globulin Ratio: 1.6 (ref 1.2–2.2)
Albumin: 4.5 g/dL (ref 3.9–4.9)
Alkaline Phosphatase: 89 IU/L (ref 44–121)
BUN/Creatinine Ratio: 24 — ABNORMAL HIGH (ref 9–23)
BUN: 20 mg/dL (ref 6–24)
Bilirubin Total: 0.3 mg/dL (ref 0.0–1.2)
CO2: 20 mmol/L (ref 20–29)
Calcium: 9.7 mg/dL (ref 8.7–10.2)
Chloride: 104 mmol/L (ref 96–106)
Creatinine, Ser: 0.85 mg/dL (ref 0.57–1.00)
Globulin, Total: 2.8 g/dL (ref 1.5–4.5)
Glucose: 83 mg/dL (ref 70–99)
Potassium: 4.6 mmol/L (ref 3.5–5.2)
Sodium: 138 mmol/L (ref 134–144)
Total Protein: 7.3 g/dL (ref 6.0–8.5)
eGFR: 84 mL/min/{1.73_m2} (ref >59)

## 2022-02-20 LAB — VITAMIN B12: Vitamin B-12: 664 pg/mL (ref 232–1245)

## 2022-02-20 LAB — VITAMIN D 25 HYDROXY (VIT D DEFICIENCY, FRACTURES): Vit D, 25-Hydroxy: 46.3 ng/mL (ref 30.0–100.0)

## 2022-02-20 LAB — HEMOGLOBIN A1C
Est. average glucose Bld gHb Est-mCnc: 126 mg/dL
Hgb A1c MFr Bld: 6 % — ABNORMAL HIGH (ref 4.8–5.6)

## 2022-02-20 LAB — INSULIN, RANDOM: INSULIN: 15.8 u[IU]/mL (ref 2.6–24.9)

## 2022-02-22 DIAGNOSIS — M791 Myalgia, unspecified site: Secondary | ICD-10-CM | POA: Insufficient documentation

## 2022-02-22 NOTE — Progress Notes (Unsigned)
Chief Complaint:   OBESITY Shelley Ryan is here to discuss her progress with her obesity treatment plan along with follow-up of her obesity related diagnoses. Shelley Ryan is on the Category 2 Plan+100 calories and states she is following her eating plan approximately 90% of the time. Shelley Ryan states she is classes and walks 15-60 minutes 3-4 times per week.  Today's visit was #: 39 Starting weight: 242 lbs Starting date: 07/02/2019 Today's weight: 228 lbs Today's date: 02/18/2022 Total lbs lost to date: 14 lbs Total lbs lost since last in-office visit: 2 lbs  Interim History: Metabolism has decreased by a marginal 72 calories.  She started Saxenda *** 06/25/2021 RMR 1800 02/18/2022 RMR 1728 Subjective:   1. SOB (shortness of breath) on exertion She endorses dyspnea with extreme exertion.  She denies chest pain with exertion.   2. Prediabetes Recently started Saxenda, titrated up to 1.2 mg ***  3. Myalgia Diffuse myalgia, she is taking daily Crestor 5 mg daily, 02/13/2022. All myalgias stopped.Crestor history, 06/09/2020 she started on Crestor 10 mg once weekly ***     4. Mixed hyperlipidemia ***  5. Vitamin D deficiency She is currently taking prescription vitamin D 50,000 IU each week. She denies nausea, vomiting or muscle weakness.  ***  Assessment/Plan:   1. SOB (shortness of breath) on exertion Check IC today.   2. Prediabetes Check labs today.   - metFORMIN (GLUCOPHAGE) 500 MG tablet; 1 tab daily  Dispense: 60 tablet; Refill: 0 - Hemoglobin A1c - Insulin, random - Vitamin B12  3. Myalgia Decrease Crestor 5 mg to three times per week.   Check labs today.   - Comprehensive metabolic panel  4. Mixed hyperlipidemia Check labs today.   - Lipid panel  5. Vitamin D deficiency Check labs today.   - VITAMIN D 25 Hydroxy (Vit-D Deficiency, Fractures)  6. Obesity, Current BMI 35.7 Handout:  Category 2 meal plan with grocery list.  Thanksgiving strategies, holiday  recipes  Refill - Liraglutide -Weight Management (SAXENDA) 18 MG/3ML SOPN; Inject 1.2 mg into the skin daily.  Dispense: 6 mL; Refill: 0  Shelley Ryan is currently in the action stage of change. As such, her goal is to continue with weight loss efforts. She has agreed to the Category 2 Plan.   Exercise goals:  As is.   Behavioral modification strategies: increasing lean protein intake, decreasing simple carbohydrates, meal planning and cooking strategies, keeping healthy foods in the home, and planning for success.  Shelley Ryan has agreed to follow-up with our clinic in 4 weeks. She was informed of the importance of frequent follow-up visits to maximize her success with intensive lifestyle modifications for her multiple health conditions.   Shelley Ryan was informed we would discuss her lab results at her next visit unless there is a critical issue that needs to be addressed sooner. Shelley Ryan agreed to keep her next visit at the agreed upon time to discuss these results.  Objective:   Blood pressure 113/76, pulse 87, temperature 98.2 F (36.8 C), height 5\' 7"  (1.702 m), weight 228 lb (103.4 kg), SpO2 98 %. Body mass index is 35.71 kg/m.  General: Cooperative, alert, well developed, in no acute distress. HEENT: Conjunctivae and lids unremarkable. Cardiovascular: Regular rhythm.  Lungs: Normal work of breathing. Neurologic: No focal deficits.   Lab Results  Component Value Date   CREATININE 0.85 02/18/2022   BUN 20 02/18/2022   NA 138 02/18/2022   K 4.6 02/18/2022   CL 104 02/18/2022   CO2 20  02/18/2022   Lab Results  Component Value Date   ALT 20 02/18/2022   AST 17 02/18/2022   ALKPHOS 89 02/18/2022   BILITOT 0.3 02/18/2022   Lab Results  Component Value Date   HGBA1C 6.0 (H) 02/18/2022   HGBA1C 6.0 (H) 11/02/2021   HGBA1C 5.7 (H) 06/25/2021   HGBA1C 5.8 (H) 03/23/2021   HGBA1C 5.7 (H) 10/13/2020   Lab Results  Component Value Date   INSULIN 15.8 02/18/2022   INSULIN 9.4 11/02/2021    INSULIN 18.2 06/25/2021   INSULIN 17.5 03/23/2021   INSULIN 10.9 10/13/2020   Lab Results  Component Value Date   TSH 3.060 07/02/2019   Lab Results  Component Value Date   CHOL 192 02/18/2022   HDL 48 02/18/2022   LDLCALC 120 (H) 02/18/2022   TRIG 133 02/18/2022   CHOLHDL 4.0 02/18/2022   Lab Results  Component Value Date   VD25OH 46.3 02/18/2022   VD25OH 48.0 11/02/2021   VD25OH 55.2 06/25/2021   Lab Results  Component Value Date   WBC 12.1 (H) 08/29/2019   HGB 13.6 08/29/2019   HCT 41.3 08/29/2019   MCV 91.0 08/29/2019   PLT 211 08/29/2019   No results found for: "IRON", "TIBC", "FERRITIN"  Attestation Statements:   Reviewed by clinician on day of visit: allergies, medications, problem list, medical history, surgical history, family history, social history, and previous encounter notes.  I, Davy Pique, RMA, am acting as Location manager for Mina Marble, NP.  I have reviewed the above documentation for accuracy and completeness, and I agree with the above. -  ***

## 2022-02-23 ENCOUNTER — Other Ambulatory Visit (HOSPITAL_COMMUNITY): Payer: Self-pay

## 2022-02-24 ENCOUNTER — Other Ambulatory Visit (HOSPITAL_COMMUNITY): Payer: Self-pay

## 2022-03-01 NOTE — Telephone Encounter (Signed)
I have sent an email to Charge correction to void and refund $25.00.  I have also scheduled patient and daughter at [facility] due to location being closer.

## 2022-03-02 ENCOUNTER — Other Ambulatory Visit (HOSPITAL_COMMUNITY): Payer: Self-pay

## 2022-03-02 ENCOUNTER — Encounter (HOSPITAL_COMMUNITY): Payer: Self-pay

## 2022-03-11 ENCOUNTER — Other Ambulatory Visit (HOSPITAL_COMMUNITY): Payer: Self-pay

## 2022-03-11 ENCOUNTER — Encounter: Payer: Self-pay | Admitting: Nurse Practitioner

## 2022-03-11 ENCOUNTER — Telehealth: Payer: Self-pay

## 2022-03-11 ENCOUNTER — Ambulatory Visit: Payer: BC Managed Care – PPO | Admitting: Nurse Practitioner

## 2022-03-11 VITALS — BP 113/81 | HR 89 | Temp 98.3°F | Ht 67.0 in | Wt 224.0 lb

## 2022-03-11 DIAGNOSIS — E669 Obesity, unspecified: Secondary | ICD-10-CM

## 2022-03-11 DIAGNOSIS — R7303 Prediabetes: Secondary | ICD-10-CM

## 2022-03-11 DIAGNOSIS — Z6835 Body mass index (BMI) 35.0-35.9, adult: Secondary | ICD-10-CM

## 2022-03-11 DIAGNOSIS — E782 Mixed hyperlipidemia: Secondary | ICD-10-CM | POA: Diagnosis not present

## 2022-03-11 MED ORDER — SAXENDA 18 MG/3ML ~~LOC~~ SOPN
1.2000 mg | PEN_INJECTOR | Freq: Every day | SUBCUTANEOUS | 0 refills | Status: DC
  Filled 2022-03-11 – 2022-03-27 (×2): qty 6, 30d supply, fill #0

## 2022-03-11 NOTE — Telephone Encounter (Signed)
Notified patient per Shelley Ryan that patient needs to follow-up with her primary care provider on regard to the injectable Cholesterol medication. Patient verbalized understanding and stated she will them to make an appointment.

## 2022-03-16 NOTE — Progress Notes (Signed)
Chief Complaint:   OBESITY Shelley Ryan is here to discuss her progress with her obesity treatment plan along with follow-up of her obesity related diagnoses. David is on the Category 2 Plan and states she is following her eating plan approximately 0% of the time. 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  Today's visit was #: 53 Starting weight: 242 lbs Starting date: 07/02/2019 Today's weight: 224 lbs Today's date: 03/11/2022 Total lbs lost to date: 18 lbs Total lbs lost since last in-office visit: 6  Interim History: Shelley Ryan has done well with weight loss since her last visit. She is taking Saxenda 1.2 mg. Denies any side effects.She has tried Phentermine; Victoza and Ozempic in the past. Was diagnosed with PCOS at age 49 yr old. Her highest weight 259 lbs and lowest weight was 170 lbs. She is exercising 5 days a week. Calories 1300, protein: 91 grams. Notes hunger especially in the afternoon.  Subjective:   1. Mixed hyperlipidemia Labs discussed during visit today. Shelley Ryan is taking Crestor 5 mg 3 days per week. Struggling with myalgias. Has stopped Crestor in the past and myalgias subsided. Since restarting myalgias returned.  2. Prediabetes Labs discussed during visit today. Taking Metformin 500 mg and Saxenda 1.2 mg. Denies any side effects. Reports hunger in afternoon.  Assessment/Plan:   1. Mixed hyperlipidemia Stop Crestor. Follow up with PCP to discuss additional options.  Cardiovascular risk and specific lipid/LDL goals reviewed.  We discussed several lifestyle modifications today and Shelley Ryan will continue to work on diet, exercise and weight loss efforts. Orders and follow up as documented in patient record.   Counseling Intensive lifestyle modifications are the first line treatment for this issue. Dietary changes: Increase soluble fiber. Decrease simple carbohydrates. Exercise changes: Moderate to vigorous-intensity aerobic activity 150  minutes per week if tolerated. Lipid-lowering medications: see documented in medical record.   2. Prediabetes Will refill Saxenda 1.2 mg SQ daily.  Side effects discussed.    Shelley Ryan will continue to work on weight loss, exercise, and decreasing simple carbohydrates to help decrease the risk of diabetes.    3. Obesity, Current BMI 35.1 We will refill Saxenda 1.2 mg SQ once a day for 1 month with 0 refills. Side effects discussed.   -Refill Liraglutide -Weight Management (SAXENDA) 18 MG/3ML SOPN; Inject 1.2 mg into the skin daily.  Dispense: 6 mL; Refill: 0  Shelley Ryan is currently in the action stage of change. As such, her goal is to continue with weight loss efforts. She has agreed to the Category 2 Plan and keeping a food journal and adhering to recommended goals of 1300-1357 calories and 100 grams of protein.   Exercise goals: As is.  Behavioral modification strategies: no skipping meals, meal planning and cooking strategies, and holiday eating strategies .  Shelley Ryan has agreed to follow-up with our clinic in 3 weeks. She was informed of the importance of frequent follow-up visits to maximize her success with intensive lifestyle modifications for her multiple health conditions.   Objective:   Blood pressure 113/81, pulse 89, temperature 98.3 F (36.8 C), temperature source Oral, height 5\' 7"  (1.702 m), weight 224 lb (101.6 kg), SpO2 96 %. Body mass index is 35.08 kg/m.  General: Cooperative, alert, well developed, in no acute distress. HEENT: Conjunctivae and lids unremarkable. Cardiovascular: Regular rhythm.  Lungs: Normal work of breathing. Neurologic: No focal deficits.   Lab Results  Component Value Date   CREATININE 0.85 02/18/2022   BUN 20  02/18/2022   NA 138 02/18/2022   K 4.6 02/18/2022   CL 104 02/18/2022   CO2 20 02/18/2022   Lab Results  Component Value Date   ALT 20 02/18/2022   AST 17 02/18/2022   ALKPHOS 89 02/18/2022   BILITOT 0.3 02/18/2022   Lab Results   Component Value Date   HGBA1C 6.0 (H) 02/18/2022   HGBA1C 6.0 (H) 11/02/2021   HGBA1C 5.7 (H) 06/25/2021   HGBA1C 5.8 (H) 03/23/2021   HGBA1C 5.7 (H) 10/13/2020   Lab Results  Component Value Date   INSULIN 15.8 02/18/2022   INSULIN 9.4 11/02/2021   INSULIN 18.2 06/25/2021   INSULIN 17.5 03/23/2021   INSULIN 10.9 10/13/2020   Lab Results  Component Value Date   TSH 3.060 07/02/2019   Lab Results  Component Value Date   CHOL 192 02/18/2022   HDL 48 02/18/2022   LDLCALC 120 (H) 02/18/2022   TRIG 133 02/18/2022   CHOLHDL 4.0 02/18/2022   Lab Results  Component Value Date   VD25OH 46.3 02/18/2022   VD25OH 48.0 11/02/2021   VD25OH 55.2 06/25/2021   Lab Results  Component Value Date   WBC 12.1 (H) 08/29/2019   HGB 13.6 08/29/2019   HCT 41.3 08/29/2019   MCV 91.0 08/29/2019   PLT 211 08/29/2019   No results found for: "IRON", "TIBC", "FERRITIN"  Attestation Statements:   Reviewed by clinician on day of visit: allergies, medications, problem list, medical history, surgical history, family history, social history, and previous encounter notes.  I, Brendell Tyus, RMA, am acting as transcriptionist for Irene Limbo, FNP.  I have reviewed the above documentation for accuracy and completeness, and I agree with the above. Irene Limbo, FNP

## 2022-03-25 ENCOUNTER — Telehealth: Payer: Self-pay

## 2022-03-25 NOTE — Telephone Encounter (Signed)
This request has received a Cancelled outcome.  This may mean either your patient does not have active coverage with this plan, this authorization was processed as a duplicate request, or an authorization was not needed for this medication. Pt has been contacted.

## 2022-03-27 ENCOUNTER — Other Ambulatory Visit (HOSPITAL_COMMUNITY): Payer: Self-pay

## 2022-03-29 ENCOUNTER — Ambulatory Visit (INDEPENDENT_AMBULATORY_CARE_PROVIDER_SITE_OTHER): Payer: BC Managed Care – PPO | Admitting: Adult Health

## 2022-04-05 ENCOUNTER — Encounter: Payer: Self-pay | Admitting: Nurse Practitioner

## 2022-04-05 ENCOUNTER — Ambulatory Visit: Payer: BC Managed Care – PPO | Admitting: Nurse Practitioner

## 2022-04-05 ENCOUNTER — Other Ambulatory Visit (HOSPITAL_COMMUNITY): Payer: Self-pay

## 2022-04-05 VITALS — BP 102/69 | HR 83 | Temp 98.2°F | Ht 67.0 in | Wt 227.0 lb

## 2022-04-05 DIAGNOSIS — E782 Mixed hyperlipidemia: Secondary | ICD-10-CM | POA: Diagnosis not present

## 2022-04-05 DIAGNOSIS — Z6835 Body mass index (BMI) 35.0-35.9, adult: Secondary | ICD-10-CM | POA: Diagnosis not present

## 2022-04-05 DIAGNOSIS — E669 Obesity, unspecified: Secondary | ICD-10-CM | POA: Diagnosis not present

## 2022-04-05 MED ORDER — SAXENDA 18 MG/3ML ~~LOC~~ SOPN
3.0000 mg | PEN_INJECTOR | Freq: Every day | SUBCUTANEOUS | 0 refills | Status: DC
  Filled 2022-04-05: qty 15, 30d supply, fill #0

## 2022-04-20 NOTE — Progress Notes (Unsigned)
Chief Complaint:   OBESITY Shelley Ryan is here to discuss her progress with her obesity treatment plan along with follow-up of her obesity related diagnoses. Mercede is on the Category 2 Plan and states she is following her eating plan approximately 75% of the time. 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  Today's visit was #: 54 Starting weight: 242 lbs Starting date: 07/02/2019 Today's weight: 227 lbs Today's date: 04/05/2022 Total lbs lost to date: 15 lbs Total lbs lost since last in-office visit: 0  Interim History: Mylani is taking Saxenda 1.2 mg. Denies any side effects.Has been *** busy and has been as active due to the end of the ***. Calories: 1500, Protein: 90-100 grams. Has been over on calories due to holidays. Not drinking enough water. Going to [employer] next week.  Subjective:   1. Mixed hyperlipidemia Marsela stopped Crestor due to side effects. Has not discussed with PCP.  Assessment/Plan:   1. Mixed hyperlipidemia Need to follow up with PCP.  2. Obesity, Current BMI 35.6 We will increase/refill Saxenda 3 mg SQ once daily for 1 month with 0 refills. Side effects discussed.   -Increase/refill Liraglutide -Weight Management (SAXENDA) 18 MG/3ML SOPN; Inject 3 mg into the skin daily.  Dispense: 15 mL; Refill: 0  Krystena is currently in the action stage of change. As such, her goal is to continue with weight loss efforts. She has agreed to keeping a food journal and adhering to recommended goals of 1300 calories and 90+ grams of protein.   Exercise goals: All adults should avoid inactivity. Some physical activity is better than none, and adults who participate in any amount of physical activity gain some health benefits.  Behavioral modification strategies: increasing lean protein intake, increasing water intake, and holiday eating strategies .  Farran has agreed to follow-up with our clinic in 3 weeks. She was informed of the importance of frequent follow-up  visits to maximize her success with intensive lifestyle modifications for her multiple health conditions.   Objective:   Blood pressure 102/69, pulse 83, temperature 98.2 F (36.8 C), temperature source Oral, height 5\' 7"  (1.702 m), weight 227 lb (103 kg), SpO2 97 %. Body mass index is 35.55 kg/m.  General: Cooperative, alert, well developed, in no acute distress. HEENT: Conjunctivae and lids unremarkable. Cardiovascular: Regular rhythm.  Lungs: Normal work of breathing. Neurologic: No focal deficits.   Lab Results  Component Value Date   CREATININE 0.85 02/18/2022   BUN 20 02/18/2022   NA 138 02/18/2022   K 4.6 02/18/2022   CL 104 02/18/2022   CO2 20 02/18/2022   Lab Results  Component Value Date   ALT 20 02/18/2022   AST 17 02/18/2022   ALKPHOS 89 02/18/2022   BILITOT 0.3 02/18/2022   Lab Results  Component Value Date   HGBA1C 6.0 (H) 02/18/2022   HGBA1C 6.0 (H) 11/02/2021   HGBA1C 5.7 (H) 06/25/2021   HGBA1C 5.8 (H) 03/23/2021   HGBA1C 5.7 (H) 10/13/2020   Lab Results  Component Value Date   INSULIN 15.8 02/18/2022   INSULIN 9.4 11/02/2021   INSULIN 18.2 06/25/2021   INSULIN 17.5 03/23/2021   INSULIN 10.9 10/13/2020   Lab Results  Component Value Date   TSH 3.060 07/02/2019   Lab Results  Component Value Date   CHOL 192 02/18/2022   HDL 48 02/18/2022   LDLCALC 120 (H) 02/18/2022   TRIG 133 02/18/2022   CHOLHDL 4.0 02/18/2022   Lab Results  Component Value Date   VD25OH 46.3 02/18/2022   VD25OH 48.0 11/02/2021   VD25OH 55.2 06/25/2021   Lab Results  Component Value Date   WBC 12.1 (H) 08/29/2019   HGB 13.6 08/29/2019   HCT 41.3 08/29/2019   MCV 91.0 08/29/2019   PLT 211 08/29/2019   No results found for: "IRON", "TIBC", "FERRITIN"  Attestation Statements:   Reviewed by clinician on day of visit: allergies, medications, problem list, medical history, surgical history, family history, social history, and previous encounter notes.  I,  Brendell Tyus, RMA, am acting as transcriptionist for Everardo Pacific, FNP.  I have reviewed the above documentation for accuracy and completeness, and I agree with the above. -  ***

## 2022-04-27 ENCOUNTER — Other Ambulatory Visit (HOSPITAL_COMMUNITY): Payer: Self-pay

## 2022-04-27 ENCOUNTER — Encounter: Payer: Self-pay | Admitting: Bariatrics

## 2022-04-27 ENCOUNTER — Ambulatory Visit: Payer: BC Managed Care – PPO | Admitting: Bariatrics

## 2022-04-27 VITALS — BP 122/80 | HR 77 | Temp 98.3°F | Ht 67.0 in | Wt 226.0 lb

## 2022-04-27 DIAGNOSIS — Z6835 Body mass index (BMI) 35.0-35.9, adult: Secondary | ICD-10-CM | POA: Diagnosis not present

## 2022-04-27 DIAGNOSIS — Z6839 Body mass index (BMI) 39.0-39.9, adult: Secondary | ICD-10-CM

## 2022-04-27 DIAGNOSIS — E669 Obesity, unspecified: Secondary | ICD-10-CM | POA: Diagnosis not present

## 2022-04-27 DIAGNOSIS — R7303 Prediabetes: Secondary | ICD-10-CM | POA: Diagnosis not present

## 2022-04-27 DIAGNOSIS — I10 Essential (primary) hypertension: Secondary | ICD-10-CM | POA: Diagnosis not present

## 2022-04-27 MED ORDER — LISINOPRIL 5 MG PO TABS: 5.0000 mg | ORAL_TABLET | Freq: Every day | ORAL | 0 refills | Status: DC

## 2022-04-27 MED ORDER — SAXENDA 18 MG/3ML ~~LOC~~ SOPN
3.0000 mg | PEN_INJECTOR | Freq: Every day | SUBCUTANEOUS | 0 refills | Status: DC
  Filled 2022-04-27: qty 15, 30d supply, fill #0

## 2022-05-10 NOTE — Progress Notes (Unsigned)
Chief Complaint:   OBESITY Shelley Ryan is here to discuss her progress with her obesity treatment plan along with follow-up of her obesity related diagnoses. Shelley Ryan is on the Category 2 Plan and states she is following her eating plan approximately 80% of the time. Shelley Ryan states she is walking for 15 minutes 5 times per week.  Today's visit was #: 25 Starting weight: 56 Starting date: 07/02/19 Today's weight: 226 lbs Today's date: 04/27/22 Total lbs lost to date: 16 Total lbs lost since last in-office visit: -1  Interim History: She is down 1 pound over the holidays.  She has been cooking at home.  She wants to get to goal weight.  Goal weight 180-185 pounds.  Subjective:   1. Essential hypertension Controlled.  2. Prediabetes Taking Saxenda for weight loss and metformin.  Assessment/Plan:   1. Essential hypertension Refill: - lisinopril (ZESTRIL) 5 MG tablet; Take 1 tablet (5 mg total) by mouth daily.  Dispense: 90 tablet; Refill: 0  2. Prediabetes 1. Continue metformin. 2. Minimize all carbohydrates (sugar and starches). 3.  Increase water (will use carbohydrates)  3. Obesity, Current BMI 35.4 1.  Meal planning 2.  Intentional eating 3. Refill- Liraglutide -Weight Management (SAXENDA) 18 MG/3ML SOPN; Inject 3 mg into the skin daily.  Dispense: 15 mL; Refill: 0 (1.8 mg into the skin) 4.  Make snacks harder to access.  Shelley Ryan is currently in the action stage of change. As such, her goal is to continue with weight loss efforts. She has agreed to the Category 2 Plan and keeping a food journal and adhering to recommended goals of 1300 calories and 90+ grams protein daily.  Exercise goals: Walk more and will walk during the day- 10 minutes.  Behavioral modification strategies: increasing lean protein intake, decreasing simple carbohydrates, increasing vegetables, increasing water intake, decreasing eating out, no skipping meals, meal planning and cooking strategies, keeping  healthy foods in the home, and planning for success.  Shelley Ryan has agreed to follow-up with our clinic in 4 weeks. She was informed of the importance of frequent follow-up visits to maximize her success with intensive lifestyle modifications for her multiple health conditions.    Objective:   Blood pressure 122/80, pulse 77, temperature 98.3 F (36.8 C), height 5\' 7"  (1.702 m), weight 226 lb (102.5 kg), SpO2 97 %. Body mass index is 35.4 kg/m.  General: Cooperative, alert, well developed, in no acute distress. HEENT: Conjunctivae and lids unremarkable. Cardiovascular: Regular rhythm.  Lungs: Normal work of breathing. Neurologic: No focal deficits.   Lab Results  Component Value Date   CREATININE 0.85 02/18/2022   BUN 20 02/18/2022   NA 138 02/18/2022   K 4.6 02/18/2022   CL 104 02/18/2022   CO2 20 02/18/2022   Lab Results  Component Value Date   ALT 20 02/18/2022   AST 17 02/18/2022   ALKPHOS 89 02/18/2022   BILITOT 0.3 02/18/2022   Lab Results  Component Value Date   HGBA1C 6.0 (H) 02/18/2022   HGBA1C 6.0 (H) 11/02/2021   HGBA1C 5.7 (H) 06/25/2021   HGBA1C 5.8 (H) 03/23/2021   HGBA1C 5.7 (H) 10/13/2020   Lab Results  Component Value Date   INSULIN 15.8 02/18/2022   INSULIN 9.4 11/02/2021   INSULIN 18.2 06/25/2021   INSULIN 17.5 03/23/2021   INSULIN 10.9 10/13/2020   Lab Results  Component Value Date   TSH 3.060 07/02/2019   Lab Results  Component Value Date   CHOL 192 02/18/2022  HDL 48 02/18/2022   LDLCALC 120 (H) 02/18/2022   TRIG 133 02/18/2022   CHOLHDL 4.0 02/18/2022   Lab Results  Component Value Date   VD25OH 46.3 02/18/2022   VD25OH 48.0 11/02/2021   VD25OH 55.2 06/25/2021   Lab Results  Component Value Date   WBC 12.1 (H) 08/29/2019   HGB 13.6 08/29/2019   HCT 41.3 08/29/2019   MCV 91.0 08/29/2019   PLT 211 08/29/2019   No results found for: "IRON", "TIBC", "FERRITIN"   Attestation Statements:   Reviewed by clinician on day of  visit: allergies, medications, problem list, medical history, surgical history, family history, social history, and previous encounter notes.  I, Dawn Whitmire, FNP-C, am acting as transcriptionist for Dr. Jearld Lesch.  I have reviewed the above documentation for accuracy and completeness, and I agree with the above. Jearld Lesch, DO

## 2022-05-24 ENCOUNTER — Ambulatory Visit: Payer: BC Managed Care – PPO | Admitting: Nurse Practitioner

## 2022-05-24 ENCOUNTER — Encounter: Payer: Self-pay | Admitting: Nurse Practitioner

## 2022-05-24 VITALS — BP 108/74 | HR 87 | Temp 98.2°F | Ht 67.0 in | Wt 224.0 lb

## 2022-05-24 DIAGNOSIS — R7303 Prediabetes: Secondary | ICD-10-CM | POA: Diagnosis not present

## 2022-05-24 DIAGNOSIS — E782 Mixed hyperlipidemia: Secondary | ICD-10-CM

## 2022-05-24 DIAGNOSIS — E669 Obesity, unspecified: Secondary | ICD-10-CM

## 2022-05-24 DIAGNOSIS — Z6835 Body mass index (BMI) 35.0-35.9, adult: Secondary | ICD-10-CM

## 2022-05-24 DIAGNOSIS — G4733 Obstructive sleep apnea (adult) (pediatric): Secondary | ICD-10-CM | POA: Diagnosis not present

## 2022-05-24 NOTE — Patient Instructions (Signed)

## 2022-05-26 LAB — COMPREHENSIVE METABOLIC PANEL
ALT: 13 IU/L (ref 0–32)
AST: 12 IU/L (ref 0–40)
Albumin/Globulin Ratio: 1.8 (ref 1.2–2.2)
Albumin: 4.4 g/dL (ref 3.9–4.9)
Alkaline Phosphatase: 73 IU/L (ref 44–121)
BUN/Creatinine Ratio: 15 (ref 9–23)
BUN: 14 mg/dL (ref 6–24)
Bilirubin Total: 0.4 mg/dL (ref 0.0–1.2)
CO2: 17 mmol/L — ABNORMAL LOW (ref 20–29)
Calcium: 9.5 mg/dL (ref 8.7–10.2)
Chloride: 105 mmol/L (ref 96–106)
Creatinine, Ser: 0.92 mg/dL (ref 0.57–1.00)
Globulin, Total: 2.5 g/dL (ref 1.5–4.5)
Glucose: 91 mg/dL (ref 70–99)
Potassium: 4.5 mmol/L (ref 3.5–5.2)
Sodium: 139 mmol/L (ref 134–144)
Total Protein: 6.9 g/dL (ref 6.0–8.5)
eGFR: 77 mL/min/{1.73_m2} (ref >59)

## 2022-05-26 LAB — INSULIN, RANDOM: INSULIN: 13.4 u[IU]/mL (ref 2.6–24.9)

## 2022-05-26 LAB — HEMOGLOBIN A1C
Est. average glucose Bld gHb Est-mCnc: 123 mg/dL
Hgb A1c MFr Bld: 5.9 % — ABNORMAL HIGH (ref 4.8–5.6)

## 2022-05-27 NOTE — Progress Notes (Signed)
Chief Complaint:   OBESITY Shelley Ryan is here to discuss her progress with her obesity treatment plan along with follow-up of her obesity related diagnoses. Shelley Ryan is on the Category 2 Plan and states she is following her eating plan approximately 90% of the time. Shelley Ryan states she is walking/exercise class 30 minutes 2 times per week.  Today's visit was #: 49 Starting weight: 242 lbs Starting date: 07/02/2019 Today's weight: 224 lbs Today's date: 05/24/2022 Total lbs lost to date: 18 lbs Total lbs lost since last in-office visit: 2  Interim History: Shelley Ryan has been on Saxenda x 1 month due to shortage.  Since stopping Saxenda, polyphagia has increased.  Admits to eat more protein.  Her highest weight was 269 pounds.  Has tried phentermine in the past along with several weight loss programs.  Subjective:   1. Prediabetes Taking Metformin 500 mg twice a day.  Increased dose when unable to obtain Saxenda.  Denies side effects.  2. OSA (obstructive sleep apnea) Currently using CPAP.  In the process of getting fitted for a dental device.  3. Mixed hyperlipidemia Recently started on Zetia by her PCP.  Denies side effects  Assessment/Plan:   1. Prediabetes We will obtain labs today.  Continue Metformin 500 mg twice a day.  Side effects discussed  - Comprehensive metabolic panel - Hemoglobin A1c - Insulin, random  2. OSA (obstructive sleep apnea)  Continue CPAP nightly.  3. Mixed hyperlipidemia   Continue to follow-up with PCP.  Continue medications as directed.    4. BMI 35.0-35.9,adult Shelley Ryan is currently in the action stage of change. As such, her goal is to continue with weight loss efforts. She has agreed to the Category 2 Plan.   Exercise goals: As is.  Behavioral modification strategies: increasing lean protein intake, increasing vegetables, and increasing water intake.  Shelley Ryan has agreed to follow-up with our clinic in 4 weeks. She was informed of the importance of  frequent follow-up visits to maximize her success with intensive lifestyle modifications for her multiple health conditions.   Shelley Ryan was informed we would discuss her lab results at her next visit unless there is a critical issue that needs to be addressed sooner. Shelley Ryan agreed to keep her next visit at the agreed upon time to discuss these results.  Objective:   Blood pressure 108/74, pulse 87, temperature 98.2 F (36.8 C), height 5\' 7"  (1.702 m), weight 224 lb (101.6 kg), last menstrual period 05/23/2022, SpO2 95 %. Body mass index is 35.08 kg/m.  General: Cooperative, alert, well developed, in no acute distress. HEENT: Conjunctivae and lids unremarkable. Cardiovascular: Regular rhythm.  Lungs: Normal work of breathing. Neurologic: No focal deficits.   Lab Results  Component Value Date   CREATININE 0.92 05/24/2022   BUN 14 05/24/2022   NA 139 05/24/2022   K 4.5 05/24/2022   CL 105 05/24/2022   CO2 17 (L) 05/24/2022   Lab Results  Component Value Date   ALT 13 05/24/2022   AST 12 05/24/2022   ALKPHOS 73 05/24/2022   BILITOT 0.4 05/24/2022   Lab Results  Component Value Date   HGBA1C 5.9 (H) 05/24/2022   HGBA1C 6.0 (H) 02/18/2022   HGBA1C 6.0 (H) 11/02/2021   HGBA1C 5.7 (H) 06/25/2021   HGBA1C 5.8 (H) 03/23/2021   Lab Results  Component Value Date   INSULIN 13.4 05/24/2022   INSULIN 15.8 02/18/2022   INSULIN 9.4 11/02/2021   INSULIN 18.2 06/25/2021   INSULIN 17.5 03/23/2021  Lab Results  Component Value Date   TSH 3.060 07/02/2019   Lab Results  Component Value Date   CHOL 192 02/18/2022   HDL 48 02/18/2022   LDLCALC 120 (H) 02/18/2022   TRIG 133 02/18/2022   CHOLHDL 4.0 02/18/2022   Lab Results  Component Value Date   VD25OH 46.3 02/18/2022   VD25OH 48.0 11/02/2021   VD25OH 55.2 06/25/2021   Lab Results  Component Value Date   WBC 12.1 (H) 08/29/2019   HGB 13.6 08/29/2019   HCT 41.3 08/29/2019   MCV 91.0 08/29/2019   PLT 211 08/29/2019   No  results found for: "IRON", "TIBC", "FERRITIN" Attestation Statements:   Reviewed by clinician on day of visit: allergies, medications, problem list, medical history, surgical history, family history, social history, and previous encounter notes.  I, Brendell Tyus, RMA, am acting as transcriptionist for Everardo Pacific, FNP.  I have reviewed the above documentation for accuracy and completeness, and I agree with the above. Everardo Pacific, FNP

## 2022-05-31 ENCOUNTER — Other Ambulatory Visit (HOSPITAL_COMMUNITY): Payer: Self-pay

## 2022-05-31 ENCOUNTER — Encounter: Payer: Self-pay | Admitting: Nurse Practitioner

## 2022-06-01 ENCOUNTER — Telehealth: Payer: Self-pay

## 2022-06-01 ENCOUNTER — Telehealth (INDEPENDENT_AMBULATORY_CARE_PROVIDER_SITE_OTHER): Payer: Self-pay | Admitting: Nurse Practitioner

## 2022-06-01 ENCOUNTER — Other Ambulatory Visit (HOSPITAL_COMMUNITY): Payer: Self-pay

## 2022-06-01 NOTE — Telephone Encounter (Signed)
PA submitted through [employer] for Saxenda. Awaiting insurance determination. Key: [id number]

## 2022-06-01 NOTE — Telephone Encounter (Signed)
Elmyra Ricks from [employer] [employer] called wanting to ask some questions of the medical staff regarding patient. Please call [facility] at [phone]. Reference: PA # [id number].     [facility].

## 2022-06-01 NOTE — Telephone Encounter (Signed)
Attempted to call back. No answer and sent to VM. Will try back.

## 2022-06-02 NOTE — Telephone Encounter (Signed)
Spoke with [facility]. They are going to fax an additional form that needs filled out for [place].

## 2022-06-14 ENCOUNTER — Encounter: Payer: Self-pay | Admitting: Nurse Practitioner

## 2022-06-14 ENCOUNTER — Other Ambulatory Visit (HOSPITAL_COMMUNITY): Payer: Self-pay

## 2022-06-14 ENCOUNTER — Ambulatory Visit
Admission: EM | Admit: 2022-06-14 | Discharge: 2022-06-14 | Disposition: A | Discharge status: Home / Self Care | Payer: BC Managed Care – PPO

## 2022-06-14 ENCOUNTER — Encounter: Payer: Self-pay | Admitting: Emergency Medicine

## 2022-06-14 ENCOUNTER — Ambulatory Visit (INDEPENDENT_AMBULATORY_CARE_PROVIDER_SITE_OTHER): Payer: BC Managed Care – PPO | Admitting: Nurse Practitioner

## 2022-06-14 ENCOUNTER — Telehealth: Payer: Self-pay

## 2022-06-14 VITALS — BP 112/76 | HR 86 | Temp 98.3°F | Ht 67.0 in | Wt 226.0 lb

## 2022-06-14 DIAGNOSIS — I10 Essential (primary) hypertension: Secondary | ICD-10-CM | POA: Diagnosis not present

## 2022-06-14 DIAGNOSIS — Z6835 Body mass index (BMI) 35.0-35.9, adult: Secondary | ICD-10-CM

## 2022-06-14 DIAGNOSIS — E669 Obesity, unspecified: Secondary | ICD-10-CM

## 2022-06-14 DIAGNOSIS — Z8673 Personal history of transient ischemic attack (TIA), and cerebral infarction without residual deficits: Secondary | ICD-10-CM

## 2022-06-14 DIAGNOSIS — R7303 Prediabetes: Secondary | ICD-10-CM

## 2022-06-14 DIAGNOSIS — E782 Mixed hyperlipidemia: Secondary | ICD-10-CM

## 2022-06-14 DIAGNOSIS — Z86718 Personal history of other venous thrombosis and embolism: Secondary | ICD-10-CM

## 2022-06-14 DIAGNOSIS — E559 Vitamin D deficiency, unspecified: Secondary | ICD-10-CM | POA: Diagnosis not present

## 2022-06-14 DIAGNOSIS — M79661 Pain in right lower leg: Secondary | ICD-10-CM

## 2022-06-14 MED ORDER — VITAMIN D (ERGOCALCIFEROL) 1.25 MG (50000 UNIT) PO CAPS: 50000.0000 [IU] | ORAL_CAPSULE | ORAL | 0 refills | Status: DC

## 2022-06-14 MED ORDER — WEGOVY 0.25 MG/0.5ML ~~LOC~~ SOAJ
0.2500 mg | SUBCUTANEOUS | 0 refills | Status: DC
  Filled 2022-06-14: qty 2, 28d supply, fill #0

## 2022-06-14 NOTE — ED Triage Notes (Signed)
Patient c/o right lower calf pain x 1 day since getting off of an airplane yesterday.  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  Leg is sore and unable to straighten.

## 2022-06-14 NOTE — Telephone Encounter (Signed)
Received fax from [facility].  Shelley Ryan has been approved from 06/14/22-01/13/23.

## 2022-06-14 NOTE — Progress Notes (Signed)
Office: [phone]  /  Fax: [facility] Height: 5' 7"$  (1.702 m) Weight: 226 lb (102.5 kg) Temp: 98.3 F (36.8 C) Pulse Rate: 86 BP: 112/76 SpO2: 96 % Fasting: Yes Today's Visit #: 57 Weight at Last VIsit: 224lb Weight Lost Since Last Visit: 0lb  Body Fat %: 42.1 % Fat Mass (lbs): 95.4 lbs Muscle Mass (lbs): 124.8 lbs Total Body Water (lbs): 83.2 lbs Visceral Fat Rating : 11 Starting Date: 07/02/19 Starting Weight: 242lb Total Weight Loss (lbs): 16 lb (7.258 kg)    HPI  Chief Complaint: OBESITY  Shelley Ryan is here to discuss her progress with her obesity treatment plan. She is on the the Category 2 Plan and states she is following her eating plan approximately 50 % of the time. She states she is exercising varying minutes 7 days per week.   Interval History:  Since last office visit she gained 2 lbs since her last visit.  She hasn't been taking Saxenda due to  PA.  She is struggling with hunger but not as much cravings.  She was in [place] for the past week and wasn't able to follow the meal plan. She is struggling with left calf pain and plans to go to ER or urgent care.     Pharmacotherapy for weight loss: she is not currently taking medications for medical weight loss.    Previous pharmacotherapy for medical weight loss:   she has tried Phentermine and Saxenda in the past for medical weight loss.  She stopped Phentermine due to not being effective.  She hasn't been taking Saxenda due to shortage and PA.    Pharmacotherapy for pre diabetes:  she is currently taking Metformin 575m BID.  Denies side effects.   Last A1c was 5.9 and insulin was 13.4  Lab Results  Component Value Date   HGBA1C 5.9 (H) 05/24/2022   HGBA1C 6.0 (H) 02/18/2022   HGBA1C 6.0 (H) 11/02/2021   Lab Results  Component Value Date   LDLCALC 120 (H) 02/18/2022   CREATININE 0.92 05/24/2022     Hypertension Hypertension stable.   Medication(s): Taking Lisinopril 53m   Denies chest pain, palpitations and SOB.  BP Readings from Last 3 Encounters:  06/14/22 112/76  05/24/22 108/74  04/27/22 122/80   Lab Results  Component Value Date   CREATININE 0.92 05/24/2022   CREATININE 0.85 02/18/2022   CREATININE 0.85 11/02/2021    Hyperlipidemia Medication(s): Taking Zetioa 1068m Denies side effects.    Lab Results  Component Value Date   CHOL 192 02/18/2022   HDL 48 02/18/2022   LDLCALC 120 (H) 02/18/2022   TRIG 133 02/18/2022   CHOLHDL 4.0 02/18/2022   Lab Results  Component Value Date   ALT 13 05/24/2022   AST 12 05/24/2022   ALKPHOS 73 05/24/2022   BILITOT 0.4 05/24/2022   The ASCVD Risk score (Arnett DK, et al., 2019) failed to calculate for the following reasons:   The patient has a prior MI or stroke diagnosis   Vit D deficiency  she is taking Vit D 50,000 IU weekly.  Denies side effects.  Denies nausea, vomiting or muscle weakness.    Lab Results  Component Value Date   VD25OH 46.3 02/18/2022   VD25OH 48.0 11/02/2021   VD25OH 55.2 06/25/2021      PHYSICAL EXAM:  Blood pressure 112/76, pulse 86, temperature 98.3 F (36.8 C), height 5' 7"$  (1.702 m), weight 226 lb (102.5 kg), last  menstrual period 05/23/2022, SpO2 96 %. Body mass index is 35.4 kg/m.  General: She is overweight, cooperative, alert, well developed, and in no acute distress. PSYCH: Has normal mood, affect and thought process.   Extremities: No edema.  Neurologic: No gross sensory or motor deficits. No tremors or fasciculations noted.    DIAGNOSTIC DATA REVIEWED:  BMET    Component Value Date/Time   NA 139 05/24/2022 0821   K 4.5 05/24/2022 0821   CL 105 05/24/2022 0821   CO2 17 (L) 05/24/2022 0821   GLUCOSE 91 05/24/2022 0821   GLUCOSE 96 08/29/2019 0549   BUN 14 05/24/2022 0821   CREATININE 0.92 05/24/2022 0821   CALCIUM 9.5 05/24/2022 0821   GFRNONAA 84 05/27/2020 0938   GFRAA 96 05/27/2020 0938   Lab  Results  Component Value Date   HGBA1C 5.9 (H) 05/24/2022   HGBA1C 6.1 (H) 06/19/2019   Lab Results  Component Value Date   INSULIN 13.4 05/24/2022   INSULIN 13.7 07/02/2019   Lab Results  Component Value Date   TSH 3.060 07/02/2019   CBC    Component Value Date/Time   WBC 12.1 (H) 08/29/2019 0549   RBC 4.54 08/29/2019 0549   HGB 13.6 08/29/2019 0549   HGB 15.3 06/19/2019 1149   HCT 41.3 08/29/2019 0549   HCT 44.0 06/19/2019 1149   PLT 211 08/29/2019 0549   PLT 271 06/19/2019 1149   MCV 91.0 08/29/2019 0549   MCV 87 06/19/2019 1149   MCH 30.0 08/29/2019 0549   MCHC 32.9 08/29/2019 0549   RDW 13.7 08/29/2019 0549   RDW 13.0 06/19/2019 1149   Iron Studies No results found for: "IRON", "TIBC", "FERRITIN", "IRONPCTSAT" Lipid Panel     Component Value Date/Time   CHOL 192 02/18/2022 1132   TRIG 133 02/18/2022 1132   HDL 48 02/18/2022 1132   CHOLHDL 4.0 02/18/2022 1132   LDLCALC 120 (H) 02/18/2022 1132   Hepatic Function Panel     Component Value Date/Time   PROT 6.9 05/24/2022 0821   ALBUMIN 4.4 05/24/2022 0821   AST 12 05/24/2022 0821   ALT 13 05/24/2022 0821   ALKPHOS 73 05/24/2022 0821   BILITOT 0.4 05/24/2022 0821      Component Value Date/Time   TSH 3.060 07/02/2019 1158   Nutritional Lab Results  Component Value Date   VD25OH 46.3 02/18/2022   VD25OH 48.0 11/02/2021   VD25OH 55.2 06/25/2021     ASSESSMENT AND PLAN  TREATMENT PLAN FOR OBESITY:  Recommended Dietary Goals  Tamiia is currently in the action stage of change. As such, her goal is to continue weight management plan. She has agreed to the Category 2 Plan.  Behavioral Intervention  We discussed the following Behavioral Modification Strategies today: increasing lean protein intake, decreasing simple carbohydrates , increasing vegetables, increase water intake, work on meal planning and easy cooking plans, and think about ways to increase physical activity.  Additional resources  provided today: NA  Recommended Physical Activity Goals  Joliene has been advised to work up to 150 minutes of moderate intensity aerobic activity a week and strengthening exercises 2-3 times per week for cardiovascular health, weight loss maintenance and preservation of muscle mass.   She has agreed to increase physical activity in their day and reduce sedentary time (increase NEAT).  and continue physical activity as is.    Pharmacotherapy We discussed various medication options to help Izora Gala with her weight loss efforts and we both agreed to start [facility] 0.85m.  Side effects discussed.  ASSOCIATED CONDITIONS ADDRESSED TODAY  Action/Plan  Vitamin D deficiency -     Vitamin D (Ergocalciferol); Take 1 capsule (50,000 Units total) by mouth every 7 (seven) days.  Dispense: 12 capsule; Refill: 0  Prediabetes Continue Metformin 500 mg BID.  Side effects discussed.    Nelani will continue to work on weight loss, exercise, and decreasing simple carbohydrates to help decrease the risk of diabetes.    Essential hypertension Continue to follow up with PCP. Continue medications as directed.   Raylan is working on healthy weight loss and exercise to improve blood pressure control. We will watch for signs of hypotension as she continues her lifestyle modifications.   Mixed hyperlipidemia Continue to follow up with PCP. Continue medications as directed.  Cardiovascular risk and specific lipid/LDL goals reviewed.  We discussed several lifestyle modifications today and Malky will continue to work on diet, exercise and weight loss efforts. Orders and follow up as documented in patient record.   Counseling Intensive lifestyle modifications are the first line treatment for this issue. Dietary changes: Increase soluble fiber. Decrease simple carbohydrates. Exercise changes: Moderate to vigorous-intensity aerobic activity 150 minutes per week if tolerated. Lipid-lowering medications: see documented in  medical record.   Generalized obesity -     Start [phone]; Inject 0.25 mg into the skin once a week.  Dispense: 2 mL; Refill: 0. Side effects discussed.   BMI 35.0-35.9,adult -     [id number]; Inject 0.25 mg into the skin once a week.  Dispense: 2 mL; Refill: 0      Return in about 4 weeks (around 07/12/2022).Marland Kitchen She was informed of the importance of frequent follow up visits to maximize her success with intensive lifestyle modifications for her multiple health conditions.   ATTESTASTION STATEMENTS:  Reviewed by clinician on day of visit: allergies, medications, problem list, medical history, surgical history, family history, social history, and previous encounter notes.      Ailene Rud. Lativia Velie FNP-C

## 2022-06-14 NOTE — Telephone Encounter (Signed)
PA form and notes faxed to [facility].

## 2022-06-14 NOTE — ED Notes (Signed)
Patient is being discharged from the Urgent Care and sent to the Emergency Department via private vehicle . Per Racheal Patches patient is in need of higher level of care due to further evaluation. Patient is aware and verbalizes understanding of plan of care.  Vitals:   06/14/22 0835  BP: 114/68  Pulse: 87  Resp: 18  Temp: 98.3 F (36.8 C)  SpO2: 97%

## 2022-06-14 NOTE — ED Provider Notes (Signed)
Patient complains of right calf pain that began yesterday.  Patient states she flew on an airplane yesterday.  Patient reports a history of DVT and stroke in 2002.  Patient states her leg is sore and she is unable to straighten out.  Patient was redirected to the emergency room now for further evaluation and treatment.   Lynden Oxford Scales, [place] 06/14/22 [phone]

## 2022-06-15 ENCOUNTER — Other Ambulatory Visit (HOSPITAL_COMMUNITY): Payer: Self-pay

## 2022-06-15 MED ORDER — WEGOVY 0.25 MG/0.5ML ~~LOC~~ SOAJ: 0.2500 mg | SUBCUTANEOUS | 0 refills | Status: DC

## 2022-07-13 ENCOUNTER — Ambulatory Visit: Payer: BC Managed Care – PPO | Admitting: Nurse Practitioner

## 2022-07-13 ENCOUNTER — Encounter: Payer: Self-pay | Admitting: Nurse Practitioner

## 2022-07-13 VITALS — BP 112/79 | HR 77 | Temp 97.9°F | Ht 67.0 in | Wt 221.0 lb

## 2022-07-13 DIAGNOSIS — E559 Vitamin D deficiency, unspecified: Secondary | ICD-10-CM | POA: Diagnosis not present

## 2022-07-13 DIAGNOSIS — Z6834 Body mass index (BMI) 34.0-34.9, adult: Secondary | ICD-10-CM

## 2022-07-13 DIAGNOSIS — I10 Essential (primary) hypertension: Secondary | ICD-10-CM | POA: Diagnosis not present

## 2022-07-13 DIAGNOSIS — E669 Obesity, unspecified: Secondary | ICD-10-CM | POA: Diagnosis not present

## 2022-07-13 MED ORDER — WEGOVY 0.5 MG/0.5ML ~~LOC~~ SOAJ: 0.5000 mg | SUBCUTANEOUS | 0 refills | Status: DC

## 2022-07-13 MED ORDER — LISINOPRIL 5 MG PO TABS: 5.0000 mg | ORAL_TABLET | Freq: Every day | ORAL | 0 refills | Status: DC

## 2022-07-13 NOTE — Progress Notes (Signed)
Office: [phone]  /  Fax: [phone]  WEIGHT SUMMARY AND BIOMETRICS  Weight Lost Since Last Visit: 5lb  No data recorded  Vitals Temp: 97.9 F (36.6 C) BP: 112/79 Pulse Rate: 77 SpO2: 99 %   Anthropometric Measurements Height: 5\' 7"  (1.702 m) Weight: 221 lb (100.2 kg) BMI (Calculated): 34.61 Weight at Last Visit: 226lb Weight Lost Since Last Visit: 5lb Starting Weight: 242lb Total Weight Loss (lbs): 21 lb (9.526 kg)   Body Composition  Body Fat %: 41 % Fat Mass (lbs): 90.6 lbs Muscle Mass (lbs): 123.8 lbs Total Body Water (lbs): 80.2 lbs Visceral Fat Rating : 10   Other Clinical Data Today's Visit #: 38 Starting Date: 07/02/19   HPI  Chief Complaint: OBESITY  Shelley Ryan is here to discuss her progress with her obesity treatment plan. She is on the the Category 2 Plan and states she is following her eating plan approximately 80 % of the time. She states she is exercising 0 minutes 0 days per week.   Interval History:  Since last office visit she has lost 5 pounds.  She is aiming to eat more protein.  She has been eating more yogurt, chicken, salmon, bone broth, etc.     Pharmacotherapy for weight loss: She is currently taking Wegovy 0.25 mg for medical weight loss.  Denies side effects.  She is doing well and feeling well.  Goal weight:  185 lbs.  Her highest weight was 259 lbs.  She is scheduled for a colonoscopy this week.    Previous pharmacotherapy for medical weight loss:  She has tried Phentermine and Saxenda in the past for medical weight loss.  She stopped Phentermine due to not being effective.  She hasn't been taking Saxenda due to shortage and PA.    Bariatric surgery:  Patient never had bariatric surgery.  Hypertension Hypertension well controlled.  Medication(s): Lisinopril 5mg .  Denies side effects.   Denies chest pain, palpitations and SOB.  BP Readings from Last 3 Encounters:  07/13/22 112/79  06/14/22 114/68  06/14/22 112/76   Lab  Results  Component Value Date   CREATININE 0.92 05/24/2022   CREATININE 0.85 02/18/2022   CREATININE 0.85 11/02/2021    Vit D deficiency  She is taking Vit D 50,000 IU weekly.  Denies side effects.  Denies nausea, vomiting or muscle weakness.    Lab Results  Component Value Date   VD25OH 46.3 02/18/2022   VD25OH 48.0 11/02/2021   VD25OH 55.2 06/25/2021    PHYSICAL EXAM:  Blood pressure 112/79, pulse 77, temperature 97.9 F (36.6 C), height 5\' 7"  (1.702 m), weight 221 lb (100.2 kg), last menstrual period 07/11/2022, SpO2 99 %. Body mass index is 34.61 kg/m.  General: She is overweight, cooperative, alert, well developed, and in no acute distress. PSYCH: Has normal mood, affect and thought process.   Extremities: No edema.  Neurologic: No gross sensory or motor deficits. No tremors or fasciculations noted.    DIAGNOSTIC DATA REVIEWED:  BMET    Component Value Date/Time   NA 139 05/24/2022 0821   K 4.5 05/24/2022 0821   CL 105 05/24/2022 0821   CO2 17 (L) 05/24/2022 0821   GLUCOSE 91 05/24/2022 0821   GLUCOSE 96 08/29/2019 0549   BUN 14 05/24/2022 0821   CREATININE 0.92 05/24/2022 0821   CALCIUM 9.5 05/24/2022 0821   GFRNONAA 84 05/27/2020 0938   GFRAA 96 05/27/2020 0938   Lab Results  Component Value Date   HGBA1C 5.9 (H) 05/24/2022  HGBA1C 6.1 (H) 06/19/2019   Lab Results  Component Value Date   INSULIN 13.4 05/24/2022   INSULIN 13.7 07/02/2019   Lab Results  Component Value Date   TSH 3.060 07/02/2019   CBC    Component Value Date/Time   WBC 12.1 (H) 08/29/2019 0549   RBC 4.54 08/29/2019 0549   HGB 13.6 08/29/2019 0549   HGB 15.3 06/19/2019 1149   HCT 41.3 08/29/2019 0549   HCT 44.0 06/19/2019 1149   PLT 211 08/29/2019 0549   PLT 271 06/19/2019 1149   MCV 91.0 08/29/2019 0549   MCV 87 06/19/2019 1149   MCH 30.0 08/29/2019 0549   MCHC 32.9 08/29/2019 0549   RDW 13.7 08/29/2019 0549   RDW 13.0 06/19/2019 1149   Iron Studies No results found  for: "IRON", "TIBC", "FERRITIN", "IRONPCTSAT" Lipid Panel     Component Value Date/Time   CHOL 192 02/18/2022 1132   TRIG 133 02/18/2022 1132   HDL 48 02/18/2022 1132   CHOLHDL 4.0 02/18/2022 1132   LDLCALC 120 (H) 02/18/2022 1132   Hepatic Function Panel     Component Value Date/Time   PROT 6.9 05/24/2022 0821   ALBUMIN 4.4 05/24/2022 0821   AST 12 05/24/2022 0821   ALT 13 05/24/2022 0821   ALKPHOS 73 05/24/2022 0821   BILITOT 0.4 05/24/2022 0821      Component Value Date/Time   TSH 3.060 07/02/2019 1158   Nutritional Lab Results  Component Value Date   VD25OH 46.3 02/18/2022   VD25OH 48.0 11/02/2021   VD25OH 55.2 06/25/2021     ASSESSMENT AND PLAN  TREATMENT PLAN FOR OBESITY:  Recommended Dietary Goals  Shelley Ryan is currently in the action stage of change. As such, her goal is to continue weight management plan. She has agreed to the Category 2 Plan.  Behavioral Intervention  We discussed the following Behavioral Modification Strategies today: increasing lean protein intake, decreasing simple carbohydrates , increasing vegetables, avoiding skipping meals, increasing water intake, and work on meal planning and easy cooking plans.  Additional resources provided today: NA  Recommended Physical Activity Goals  Shelley Ryan has been advised to work up to 150 minutes of moderate intensity aerobic activity a week and strengthening exercises 2-3 times per week for cardiovascular health, weight loss maintenance and preservation of muscle mass.   She has agreed to Think about ways to increase physical activity and Work on scheduling and tracking physical activity.    Pharmacotherapy We discussed various medication options to help Shelley Ryan with her weight loss efforts and we both agreed to increase Wegovy to 0.5mg -3 month supply.  ASSOCIATED CONDITIONS ADDRESSED TODAY  Action/Plan  Essential hypertension -     Lisinopril; Take 1 tablet (5 mg total) by mouth daily.  Dispense:  90 tablet; Refill: 0 Will continue to monitor  Vitamin D deficiency Continue Vit D as directed.   Low Vitamin D level contributes to fatigue and are associated with obesity, breast, and colon cancer. She agrees to continue to take prescription Vitamin D @50 ,000 IU every week and will follow-up for routine testing of Vitamin D, at least 2-3 times per year to avoid over-replacement.   Generalized obesity -     Increase Wegovy; Inject 0.5 mg into the skin once a week.  Dispense: 6 mL; Refill: 0.  Side effects discussed.   BMI 34.0-34.9,adult -     [id number]; Inject 0.5 mg into the skin once a week.  Dispense: 6 mL; Refill: 0  Return in about 4 weeks (around 08/10/2022).Marland Kitchen She was informed of the importance of frequent follow up visits to maximize her success with intensive lifestyle modifications for her multiple health conditions.   ATTESTASTION STATEMENTS:  Reviewed by clinician on day of visit: allergies, medications, problem list, medical history, surgical history, family history, social history, and previous encounter notes.    Ailene Rud. Saran Laviolette FNP-C

## 2022-07-20 ENCOUNTER — Other Ambulatory Visit (HOSPITAL_COMMUNITY): Payer: Self-pay

## 2022-07-20 ENCOUNTER — Other Ambulatory Visit: Payer: Self-pay | Admitting: Nurse Practitioner

## 2022-07-20 ENCOUNTER — Encounter: Payer: Self-pay | Admitting: Nurse Practitioner

## 2022-07-20 DIAGNOSIS — E669 Obesity, unspecified: Secondary | ICD-10-CM

## 2022-07-20 DIAGNOSIS — Z6834 Body mass index (BMI) 34.0-34.9, adult: Secondary | ICD-10-CM

## 2022-07-20 MED ORDER — WEGOVY 0.5 MG/0.5ML ~~LOC~~ SOAJ
0.5000 mg | SUBCUTANEOUS | 0 refills | Status: DC
  Filled 2022-07-20 – 2022-07-26 (×4): qty 2, 28d supply, fill #0

## 2022-07-26 ENCOUNTER — Other Ambulatory Visit (HOSPITAL_COMMUNITY): Payer: Self-pay

## 2022-07-26 ENCOUNTER — Other Ambulatory Visit: Payer: Self-pay

## 2022-08-02 ENCOUNTER — Encounter: Payer: Self-pay | Admitting: Nurse Practitioner

## 2022-08-02 ENCOUNTER — Ambulatory Visit: Payer: BC Managed Care – PPO | Admitting: Nurse Practitioner

## 2022-08-02 ENCOUNTER — Other Ambulatory Visit (HOSPITAL_COMMUNITY): Payer: Self-pay

## 2022-08-02 VITALS — BP 118/80 | HR 100 | Temp 98.1°F | Ht 67.0 in | Wt 221.0 lb

## 2022-08-02 DIAGNOSIS — I639 Cerebral infarction, unspecified: Secondary | ICD-10-CM

## 2022-08-02 DIAGNOSIS — E669 Obesity, unspecified: Secondary | ICD-10-CM

## 2022-08-02 DIAGNOSIS — I10 Essential (primary) hypertension: Secondary | ICD-10-CM

## 2022-08-02 DIAGNOSIS — R7303 Prediabetes: Secondary | ICD-10-CM

## 2022-08-02 DIAGNOSIS — G4733 Obstructive sleep apnea (adult) (pediatric): Secondary | ICD-10-CM

## 2022-08-02 DIAGNOSIS — E782 Mixed hyperlipidemia: Secondary | ICD-10-CM

## 2022-08-02 DIAGNOSIS — Z6834 Body mass index (BMI) 34.0-34.9, adult: Secondary | ICD-10-CM

## 2022-08-02 MED ORDER — WEGOVY 1 MG/0.5ML ~~LOC~~ SOAJ
1.0000 mg | SUBCUTANEOUS | 0 refills | Status: DC
  Filled 2022-08-02: qty 2, 28d supply, fill #0

## 2022-08-02 NOTE — Progress Notes (Signed)
Office: [phone]  /  Fax: [phone]  WEIGHT SUMMARY AND BIOMETRICS  Weight Lost Since Last Visit: 0 lb  No data recorded  Vitals Temp: 98.1 F (36.7 C) BP: 118/80 Pulse Rate: 100 SpO2: 96 %   Anthropometric Measurements Height: 5\' 7"  (1.702 m) Weight: 221 lb (100.2 kg) BMI (Calculated): 34.61 Weight at Last Visit: 221 lb Weight Lost Since Last Visit: 0 lb Starting Weight: 242 lb Total Weight Loss (lbs): 21 lb (9.526 kg)   Body Composition  Body Fat %: 40.7 % Fat Mass (lbs): 90.2 lbs Muscle Mass (lbs): 125 lbs Total Body Water (lbs): 82.2 lbs Visceral Fat Rating : 10   Other Clinical Data Fasting: Yes Labs: No Today's Visit #: 39 Starting Date: 07/02/19     HPI  Chief Complaint: OBESITY  Maddy is here to discuss her progress with her obesity treatment plan. She is on the Cat 2 plan and states she is following her eating plan approximately 80 % of the time. She states she is exercising 60 minutes 2 days per week.   Interval History:  Since last office visit she has maintained her weight.  Goal weight 185 lbs.     Pharmacotherapy for weight loss: She is currently taking Wegovy 1mg  (last dose was last Monday) for medical weight loss.  Denies side effects.  She does have Wegovy 0.25mg  3 doses and 0.5mg  4 doses at home.  Plans to take 0.5mg  first and then will take 0.25mg  after she finishes the 0.5mg .    Previous pharmacotherapy for medical weight loss:  She has tried Phentermine and Saxenda in the past for medical weight loss.  She stopped Phentermine due to not being effective.  She hasn't been taking Saxenda due to shortage and PA.    Bariatric surgery:  never had bariatric surgery.     Prediabetes Last A1c was 5.9  Medication(s): Wegovy 1mg  and Meformin 500mg  BID.  Denies side effects.  Struggles with polyphagia and cravings when not taking Metformin and Wegovy.    Lab Results  Component Value Date   HGBA1C 5.9 (H) 05/24/2022   HGBA1C 6.0  (H) 02/18/2022   HGBA1C 6.0 (H) 11/02/2021   HGBA1C 5.7 (H) 06/25/2021   HGBA1C 5.8 (H) 03/23/2021   Lab Results  Component Value Date   INSULIN 13.4 05/24/2022   INSULIN 15.8 02/18/2022   INSULIN 9.4 11/02/2021   INSULIN 18.2 06/25/2021   INSULIN 17.5 03/23/2021    Hypertension Hypertension BP looks good today.  History of CVA.   Medication(s): Lisinopril Denies chest pain, palpitations and SOB.  BP Readings from Last 3 Encounters:  08/02/22 118/80  07/13/22 112/79  06/14/22 114/68   Lab Results  Component Value Date   CREATININE 0.92 05/24/2022   CREATININE 0.85 02/18/2022   CREATININE 0.85 11/02/2021    Obstructive Sleep Apnea Rosa has a diagnosis of sleep apnea. She reports that she is not using a CPAP regularly.  She is getting fitted for mouth guard/device.   Hyperlipidemia Medication(s): Not currently on meds due to history of side effects.  Cardiovascular risk factors: dyslipidemia, hypertension, and obesity (BMI >= 30 kg/m2)  Lab Results  Component Value Date   CHOL 192 02/18/2022   HDL 48 02/18/2022   LDLCALC 120 (H) 02/18/2022   TRIG 133 02/18/2022   CHOLHDL 4.0 02/18/2022   Lab Results  Component Value Date   ALT 13 05/24/2022   AST 12 05/24/2022   ALKPHOS 73 05/24/2022   BILITOT 0.4 05/24/2022  The ASCVD Risk score (Arnett DK, et al., 2019) failed to calculate for the following reasons:   The patient has a prior MI or stroke diagnosis     PHYSICAL EXAM:  Blood pressure 118/80, pulse 100, temperature 98.1 F (36.7 C), height 5\' 7"  (1.702 m), weight 221 lb (100.2 kg), last menstrual period 07/11/2022, SpO2 96 %. Body mass index is 34.61 kg/m.  General: She is overweight, cooperative, alert, well developed, and in no acute distress. PSYCH: Has normal mood, affect and thought process.   Extremities: No edema.  Neurologic: No gross sensory or motor deficits. No tremors or fasciculations noted.    DIAGNOSTIC DATA REVIEWED:  BMET     Component Value Date/Time   NA 139 05/24/2022 0821   K 4.5 05/24/2022 0821   CL 105 05/24/2022 0821   CO2 17 (L) 05/24/2022 0821   GLUCOSE 91 05/24/2022 0821   GLUCOSE 96 08/29/2019 0549   BUN 14 05/24/2022 0821   CREATININE 0.92 05/24/2022 0821   CALCIUM 9.5 05/24/2022 0821   GFRNONAA 84 05/27/2020 0938   GFRAA 96 05/27/2020 0938   Lab Results  Component Value Date   HGBA1C 5.9 (H) 05/24/2022   HGBA1C 6.1 (H) 06/19/2019   Lab Results  Component Value Date   INSULIN 13.4 05/24/2022   INSULIN 13.7 07/02/2019   Lab Results  Component Value Date   TSH 3.060 07/02/2019   CBC    Component Value Date/Time   WBC 12.1 (H) 08/29/2019 0549   RBC 4.54 08/29/2019 0549   HGB 13.6 08/29/2019 0549   HGB 15.3 06/19/2019 1149   HCT 41.3 08/29/2019 0549   HCT 44.0 06/19/2019 1149   PLT 211 08/29/2019 0549   PLT 271 06/19/2019 1149   MCV 91.0 08/29/2019 0549   MCV 87 06/19/2019 1149   MCH 30.0 08/29/2019 0549   MCHC 32.9 08/29/2019 0549   RDW 13.7 08/29/2019 0549   RDW 13.0 06/19/2019 1149   Iron Studies No results found for: "IRON", "TIBC", "FERRITIN", "IRONPCTSAT" Lipid Panel     Component Value Date/Time   CHOL 192 02/18/2022 1132   TRIG 133 02/18/2022 1132   HDL 48 02/18/2022 1132   CHOLHDL 4.0 02/18/2022 1132   LDLCALC 120 (H) 02/18/2022 1132   Hepatic Function Panel     Component Value Date/Time   PROT 6.9 05/24/2022 0821   ALBUMIN 4.4 05/24/2022 0821   AST 12 05/24/2022 0821   ALT 13 05/24/2022 0821   ALKPHOS 73 05/24/2022 0821   BILITOT 0.4 05/24/2022 0821      Component Value Date/Time   TSH 3.060 07/02/2019 1158   Nutritional Lab Results  Component Value Date   VD25OH 46.3 02/18/2022   VD25OH 48.0 11/02/2021   VD25OH 55.2 06/25/2021     ASSESSMENT AND PLAN  TREATMENT PLAN FOR OBESITY:  Recommended Dietary Goals  Harriett Sineancy is currently in the action stage of change. As such, her goal is to continue weight management plan. She has agreed to the  Category 2 Plan.  Behavioral Intervention  We discussed the following Behavioral Modification Strategies today: increasing lean protein intake, increasing vegetables, avoiding skipping meals, increasing water intake, and work on meal planning and preparation.  Additional resources provided today: NA  Recommended Physical Activity Goals  Harriett Sineancy has been advised to work up to 150 minutes of moderate intensity aerobic activity a week and strengthening exercises 2-3 times per week for cardiovascular health, weight loss maintenance and preservation of muscle mass.   She has agreed  to Think about ways to increase physical activity and Work on scheduling and tracking physical activity.    Pharmacotherapy We discussed various medication options to help Harriett Sine with her weight loss efforts and we both agreed to continue [facility]  She would benefit from continuing Wegovy due to history of CVA, HTN, HLD, OSA and pre diabetes.  See SELECT study.    ASSOCIATED CONDITIONS ADDRESSED TODAY  Action/Plan  Prediabetes -     Would benefit from continuing Wegovy due to co morbidities. Wegovy; Inject 1 mg into the skin once a week.  Dispense: 2 mL; Refill: 0  Essential hypertension -     [id number]; Inject 1 mg into the skin once a week.  Dispense: 2 mL; Refill: 0  Continue   Cerebrovascular accident (CVA), unspecified mechanism -     WEXHBZ; Inject 1 mg into the skin once a week.  Dispense: 2 mL; Refill: 0  Continue follow-up with PCP and neurology.  OSA (obstructive sleep apnea) -     JIRCVE; Inject 1 mg into the skin once a week.  Dispense: 2 mL; Refill: 0  Patient to keep appointments for evaluation for oral device.  Mixed hyperlipidemia -     [id number]; Inject 1 mg into the skin once a week.  Dispense: 2 mL; Refill: 0  Continue follow-up with PCP.  Continue working on dietary changes, exercise and weight loss.  Generalized obesity -     [id number]; Inject 1 mg into the skin once a week.  Dispense: 2  mL; Refill: 0  BMI 34.0-34.9,adult -     LFYBOF; Inject 1 mg into the skin once a week.  Dispense: 2 mL; Refill: 0         Return in about 3 weeks (around 08/23/2022).Marland Kitchen She was informed of the importance of frequent follow up visits to maximize her success with intensive lifestyle modifications for her multiple health conditions.   ATTESTASTION STATEMENTS:  Reviewed by clinician on day of visit: allergies, medications, problem list, medical history, surgical history, family history, social history, and previous encounter notes.     Theodis Sato. Timberly Yott FNP-C

## 2022-08-10 ENCOUNTER — Other Ambulatory Visit (HOSPITAL_COMMUNITY): Payer: Self-pay

## 2022-08-24 ENCOUNTER — Encounter: Payer: Self-pay | Admitting: Nurse Practitioner

## 2022-08-24 ENCOUNTER — Other Ambulatory Visit: Payer: Self-pay | Admitting: Nurse Practitioner

## 2022-08-24 ENCOUNTER — Ambulatory Visit: Payer: BC Managed Care – PPO | Admitting: Nurse Practitioner

## 2022-08-24 VITALS — BP 114/85 | HR 77 | Temp 98.1°F | Ht 67.0 in | Wt 223.0 lb

## 2022-08-24 DIAGNOSIS — R7303 Prediabetes: Secondary | ICD-10-CM | POA: Diagnosis not present

## 2022-08-24 DIAGNOSIS — G4733 Obstructive sleep apnea (adult) (pediatric): Secondary | ICD-10-CM

## 2022-08-24 DIAGNOSIS — E669 Obesity, unspecified: Secondary | ICD-10-CM | POA: Diagnosis not present

## 2022-08-24 DIAGNOSIS — Z6834 Body mass index (BMI) 34.0-34.9, adult: Secondary | ICD-10-CM

## 2022-08-24 DIAGNOSIS — Z7984 Long term (current) use of oral hypoglycemic drugs: Secondary | ICD-10-CM

## 2022-08-24 MED ORDER — METFORMIN HCL 500 MG PO TABS: ORAL_TABLET | ORAL | 0 refills | Status: DC

## 2022-08-24 NOTE — Progress Notes (Signed)
Office: [phone]  /  Fax: [phone]  WEIGHT SUMMARY AND BIOMETRICS  No data recorded Weight Gained Since Last Visit: 2lb   Vitals Temp: 98.1 F (36.7 C) BP: 114/85 Pulse Rate: 77 SpO2: 96 %   Anthropometric Measurements Height: 5\' 7"  (1.702 m) Weight: 223 lb (101.2 kg) BMI (Calculated): 34.92 Weight at Last Visit: 221lb Weight Gained Since Last Visit: 2lb Starting Weight: 242lb Total Weight Loss (lbs): 19 lb (8.618 kg)   Body Composition  Body Fat %: 40.4 % Fat Mass (lbs): 90.2 lbs Muscle Mass (lbs): 126.4 lbs Total Body Water (lbs): 82 lbs Visceral Fat Rating : 10   Other Clinical Data Fasting: Yes Labs: No Today's Visit #: 60 Starting Date: 07/02/19     HPI  Chief Complaint: OBESITY  Shelley Ryan is here to discuss her progress with her obesity treatment plan. She is on the the Category 2 Plan and states she is following her eating plan approximately 50 % of the time. She states she is exercising 0 minutes 0 days per week.   Interval History:  Since last office visit she has gained 2 pounds.  She is struggling with having time for herself.  She is working 65+ hours weekly.  She is aiming to eat more protein.  She has been eating out more but has been trying to focus on protein and vegetables.  She is drinking more water and has been trying to walk around campus.     Pharmacotherapy for weight loss: She is currently taking Wegovy 0.25mg  for medical weight loss. She also has some Wegovy 0.5mg  at home.  Denies side effects.    Previous pharmacotherapy for medical weight loss:  She has tried Phentermine, Reginal Lutes and Saxenda in the past for medical weight loss. She stopped Phentermine due to not being effective. She wasn't able to continue GLP-1 due to cost.    Bariatric surgery:  Patient has not had bariatric surgery.    Obstructive Sleep Apnea Shelley Ryan has a diagnosis of sleep apnea. She reports that she is using a dental device over the past 1-2 weeks and  is doing well. She hasn't been able to wear her CPAP.  She takes it off nightly.  Since starting the dental device, she is sleeping better and denies snoring or apneic pauses. Her quality of life is better since since starting the dental device.   Prediabetes Last A1c was 5.9  Medication(s): Metformin 500mg  BID.  Denies side effects.   Reports polyphagia and cravings especially since having to decrease her Wegovy dose.    Lab Results  Component Value Date   HGBA1C 5.9 (H) 05/24/2022   HGBA1C 6.0 (H) 02/18/2022   HGBA1C 6.0 (H) 11/02/2021   HGBA1C 5.7 (H) 06/25/2021   HGBA1C 5.8 (H) 03/23/2021   Lab Results  Component Value Date   INSULIN 13.4 05/24/2022   INSULIN 15.8 02/18/2022   INSULIN 9.4 11/02/2021   INSULIN 18.2 06/25/2021   INSULIN 17.5 03/23/2021    PHYSICAL EXAM:  Blood pressure 114/85, pulse 77, temperature 98.1 F (36.7 C), height 5\' 7"  (1.702 m), weight 223 lb (101.2 kg), SpO2 96 %. Body mass index is 34.93 kg/m.  General: She is overweight, cooperative, alert, well developed, and in no acute distress. PSYCH: Has normal mood, affect and thought process.   Extremities: No edema.  Neurologic: No gross sensory or motor deficits. No tremors or fasciculations noted.    DIAGNOSTIC DATA REVIEWED:  BMET    Component Value Date/Time   NA  139 05/24/2022 0821   K 4.5 05/24/2022 0821   CL 105 05/24/2022 0821   CO2 17 (L) 05/24/2022 0821   GLUCOSE 91 05/24/2022 0821   GLUCOSE 96 08/29/2019 0549   BUN 14 05/24/2022 0821   CREATININE 0.92 05/24/2022 0821   CALCIUM 9.5 05/24/2022 0821   GFRNONAA 84 05/27/2020 0938   GFRAA 96 05/27/2020 0938   Lab Results  Component Value Date   HGBA1C 5.9 (H) 05/24/2022   HGBA1C 6.1 (H) 06/19/2019   Lab Results  Component Value Date   INSULIN 13.4 05/24/2022   INSULIN 13.7 07/02/2019   Lab Results  Component Value Date   TSH 3.060 07/02/2019   CBC    Component Value Date/Time   WBC 12.1 (H) 08/29/2019 0549   RBC 4.54  08/29/2019 0549   HGB 13.6 08/29/2019 0549   HGB 15.3 06/19/2019 1149   HCT 41.3 08/29/2019 0549   HCT 44.0 06/19/2019 1149   PLT 211 08/29/2019 0549   PLT 271 06/19/2019 1149   MCV 91.0 08/29/2019 0549   MCV 87 06/19/2019 1149   MCH 30.0 08/29/2019 0549   MCHC 32.9 08/29/2019 0549   RDW 13.7 08/29/2019 0549   RDW 13.0 06/19/2019 1149   Iron Studies No results found for: "IRON", "TIBC", "FERRITIN", "IRONPCTSAT" Lipid Panel     Component Value Date/Time   CHOL 192 02/18/2022 1132   TRIG 133 02/18/2022 1132   HDL 48 02/18/2022 1132   CHOLHDL 4.0 02/18/2022 1132   LDLCALC 120 (H) 02/18/2022 1132   Hepatic Function Panel     Component Value Date/Time   PROT 6.9 05/24/2022 0821   ALBUMIN 4.4 05/24/2022 0821   AST 12 05/24/2022 0821   ALT 13 05/24/2022 0821   ALKPHOS 73 05/24/2022 0821   BILITOT 0.4 05/24/2022 0821      Component Value Date/Time   TSH 3.060 07/02/2019 1158   Nutritional Lab Results  Component Value Date   VD25OH 46.3 02/18/2022   VD25OH 48.0 11/02/2021   VD25OH 55.2 06/25/2021     ASSESSMENT AND PLAN  TREATMENT PLAN FOR OBESITY:  Recommended Dietary Goals  Shelley Ryan is currently in the action stage of change. As such, her goal is to continue weight management plan. She has agreed to the Category 2 Plan.  Behavioral Intervention  We discussed the following Behavioral Modification Strategies today: increasing lean protein intake, decreasing simple carbohydrates , increasing vegetables, increasing lower glycemic fruits, increasing fiber rich foods, avoiding skipping meals, increasing water intake, continue to practice mindfulness when eating, and planning for success.  Additional resources provided today: NA  Recommended Physical Activity Goals  Shelley Ryan has been advised to work up to 150 minutes of moderate intensity aerobic activity a week and strengthening exercises 2-3 times per week for cardiovascular health, weight loss maintenance and  preservation of muscle mass.   She has agreed to Think about ways to increase physical activity, Increase physical activity in their day and reduce sedentary time (increase NEAT)., and Work on scheduling and tracking physical activity.    Pharmacotherapy We discussed various medication options to help Shelley Ryan with her weight loss efforts and we both agreed to continue the current doses of Wegovy that she has at home. She would benefit from continuing Wegovy due to history of CVA, HTN, HLD, OSA and pre diabetes.  See SELECT study.    Avoid Phentermine and Qsymia due to Htn and history of CVA.  She is also taking Zonegran.  She had side effects to topamax.  Avoid Orlistat due to Vit D def  ASSOCIATED CONDITIONS ADDRESSED TODAY  Action/Plan  OSA (obstructive sleep apnea) Continue dental device as directed   Prediabetes Shelley Ryan will continue to work on weight loss, exercise, and decreasing simple carbohydrates to help decrease the risk of diabetes.    Refill Metformin 500mg  BID.  Side effects discussed.    Generalized obesity  BMI 34.0-34.9,adult         Return in about 4 weeks (around 09/21/2022).Marland Kitchen She was informed of the importance of frequent follow up visits to maximize her success with intensive lifestyle modifications for her multiple health conditions.   ATTESTASTION STATEMENTS:  Reviewed by clinician on day of visit: allergies, medications, problem list, medical history, surgical history, family history, social history, and previous encounter notes.     Shelley Ryan. Derin Matthes FNP-C

## 2022-10-07 ENCOUNTER — Telehealth: Payer: Self-pay

## 2022-10-07 ENCOUNTER — Encounter: Payer: Self-pay | Admitting: Nurse Practitioner

## 2022-10-07 ENCOUNTER — Ambulatory Visit (INDEPENDENT_AMBULATORY_CARE_PROVIDER_SITE_OTHER): Payer: BC Managed Care – PPO | Admitting: Nurse Practitioner

## 2022-10-07 VITALS — BP 117/78 | HR 85 | Temp 98.1°F | Ht 67.0 in | Wt 226.0 lb

## 2022-10-07 DIAGNOSIS — R7303 Prediabetes: Secondary | ICD-10-CM | POA: Diagnosis not present

## 2022-10-07 DIAGNOSIS — E559 Vitamin D deficiency, unspecified: Secondary | ICD-10-CM

## 2022-10-07 DIAGNOSIS — Z79899 Other long term (current) drug therapy: Secondary | ICD-10-CM

## 2022-10-07 DIAGNOSIS — I10 Essential (primary) hypertension: Secondary | ICD-10-CM

## 2022-10-07 DIAGNOSIS — Z6835 Body mass index (BMI) 35.0-35.9, adult: Secondary | ICD-10-CM

## 2022-10-07 DIAGNOSIS — E782 Mixed hyperlipidemia: Secondary | ICD-10-CM

## 2022-10-07 DIAGNOSIS — E669 Obesity, unspecified: Secondary | ICD-10-CM

## 2022-10-07 MED ORDER — LISINOPRIL 5 MG PO TABS
5.0000 mg | ORAL_TABLET | Freq: Every day | ORAL | 0 refills | Status: DC
Start: 2022-10-07 — End: 2023-01-10

## 2022-10-07 MED ORDER — VITAMIN D (ERGOCALCIFEROL) 1.25 MG (50000 UNIT) PO CAPS
50000.0000 [IU] | ORAL_CAPSULE | ORAL | 0 refills | Status: DC
Start: 2022-10-07 — End: 2022-12-14

## 2022-10-07 MED ORDER — CONTRAVE 8-90 MG PO TB12
ORAL_TABLET | ORAL | 0 refills | Status: DC
Start: 2022-10-07 — End: 2022-11-04

## 2022-10-07 NOTE — Addendum Note (Signed)
Addended by: Irene Limbo on: 10/07/2022 10:35 AM   Modules accepted: Orders

## 2022-10-07 NOTE — Telephone Encounter (Signed)
PA submitted through Cover My Meds for Contrave. Awaiting insurance determination. Key: [id number]

## 2022-10-07 NOTE — Progress Notes (Addendum)
Office: [phone]  /  Fax: [phone]  WEIGHT SUMMARY AND BIOMETRICS  No data recorded Weight Gained Since Last Visit: 3lb   Vitals Temp: 98.1 F (36.7 C) BP: 117/78 Pulse Rate: 85 SpO2: 96 %   Anthropometric Measurements Height: 5\' 7"  (1.702 m) Weight: 226 lb (102.5 kg) BMI (Calculated): 35.39 Weight at Last Visit: 223lb Weight Gained Since Last Visit: 3lb Starting Weight: 242lb Total Weight Loss (lbs): 16 lb (7.258 kg)   Body Composition  Body Fat %: 40.5 % Fat Mass (lbs): 91.8 lbs Muscle Mass (lbs): 128 lbs Total Body Water (lbs): 83.2 lbs Visceral Fat Rating : 10   Other Clinical Data Fasting: Yes Labs: No Today's Visit #: 6 Starting Date: 07/02/19     HPI  Chief Complaint: OBESITY  Shelley Ryan is here to discuss her progress with her obesity treatment plan. She is on the the Category 2 Plan and states she is following her eating plan approximately 50 % of the time. She states she is exercising 0 minutes 0 days per week.   Interval History:  Since last office visit she has gained 3 pounds.  She was on vacation for the past 2 weeks.     Pharmacotherapy for weight loss: She is currently taking Wegovy 0.25mg  for medical weight loss (has 2 doses left at home).  Denies side effects.  She is unable to continue taking Wegovy due to cost.  Previous pharmacotherapy for medical weight loss:  She has tried Phentermine, GNFAOZ and Saxenda in the past for medical weight loss. She stopped Phentermine due to not being effective. She wasn't able to continue GLP-1 due to cost.    Bariatric surgery:  Has not had bariatric surgery.     Prediabetes Last A1c was 5.9  Medication(s): Metformin 500mg  BID.  Denies side effects Polyphagia:Yes Lab Results  Component Value Date   HGBA1C 5.9 (H) 05/24/2022   HGBA1C 6.0 (H) 02/18/2022   HGBA1C 6.0 (H) 11/02/2021   HGBA1C 5.7 (H) 06/25/2021   HGBA1C 5.8 (H) 03/23/2021   Lab Results  Component Value Date   INSULIN  13.4 05/24/2022   INSULIN 15.8 02/18/2022   INSULIN 9.4 11/02/2021   INSULIN 18.2 06/25/2021   INSULIN 17.5 03/23/2021   Hypertension Hypertension well controlled.  Medication(s): Lisinopril 5mg .  Denies side effects. Denies chest pain, palpitations and SOB.  BP Readings from Last 3 Encounters:  10/07/22 117/78  08/24/22 114/85  08/02/22 118/80   Lab Results  Component Value Date   CREATININE 0.92 05/24/2022   CREATININE 0.85 02/18/2022   CREATININE 0.85 11/02/2021    Vit D deficiency  She is taking Vit D 50,000 IU weekly.  Denies side effects.  Denies nausea, vomiting or muscle weakness.    Lab Results  Component Value Date   VD25OH 46.3 02/18/2022   VD25OH 48.0 11/02/2021   VD25OH 55.2 06/25/2021    Hyperlipidemia Labs obtained today.   Lab Results  Component Value Date   CHOL 192 02/18/2022   HDL 48 02/18/2022   LDLCALC 120 (H) 02/18/2022   TRIG 133 02/18/2022   CHOLHDL 4.0 02/18/2022   Lab Results  Component Value Date   ALT 13 05/24/2022   AST 12 05/24/2022   ALKPHOS 73 05/24/2022   BILITOT 0.4 05/24/2022   The ASCVD Risk score (Arnett DK, et al., 2019) failed to calculate for the following reasons:   The patient has a prior MI or stroke diagnosis  PHYSICAL EXAM:  Blood pressure 117/78, pulse 85, temperature 98.1  F (36.7 C), height 5\' 7"  (1.702 m), weight 226 lb (102.5 kg), SpO2 96 %. Body mass index is 35.4 kg/m.  General: She is overweight, cooperative, alert, well developed, and in no acute distress. PSYCH: Has normal mood, affect and thought process.   Extremities: No edema.  Neurologic: No gross sensory or motor deficits. No tremors or fasciculations noted.    DIAGNOSTIC DATA REVIEWED:  BMET    Component Value Date/Time   NA 139 05/24/2022 0821   K 4.5 05/24/2022 0821   CL 105 05/24/2022 0821   CO2 17 (L) 05/24/2022 0821   GLUCOSE 91 05/24/2022 0821   GLUCOSE 96 08/29/2019 0549   BUN 14 05/24/2022 0821   CREATININE 0.92 05/24/2022  0821   CALCIUM 9.5 05/24/2022 0821   GFRNONAA 84 05/27/2020 0938   GFRAA 96 05/27/2020 0938   Lab Results  Component Value Date   HGBA1C 5.9 (H) 05/24/2022   HGBA1C 6.1 (H) 06/19/2019   Lab Results  Component Value Date   INSULIN 13.4 05/24/2022   INSULIN 13.7 07/02/2019   Lab Results  Component Value Date   TSH 3.060 07/02/2019   CBC    Component Value Date/Time   WBC 12.1 (H) 08/29/2019 0549   RBC 4.54 08/29/2019 0549   HGB 13.6 08/29/2019 0549   HGB 15.3 06/19/2019 1149   HCT 41.3 08/29/2019 0549   HCT 44.0 06/19/2019 1149   PLT 211 08/29/2019 0549   PLT 271 06/19/2019 1149   MCV 91.0 08/29/2019 0549   MCV 87 06/19/2019 1149   MCH 30.0 08/29/2019 0549   MCHC 32.9 08/29/2019 0549   RDW 13.7 08/29/2019 0549   RDW 13.0 06/19/2019 1149   Iron Studies No results found for: "IRON", "TIBC", "FERRITIN", "IRONPCTSAT" Lipid Panel     Component Value Date/Time   CHOL 192 02/18/2022 1132   TRIG 133 02/18/2022 1132   HDL 48 02/18/2022 1132   CHOLHDL 4.0 02/18/2022 1132   LDLCALC 120 (H) 02/18/2022 1132   Hepatic Function Panel     Component Value Date/Time   PROT 6.9 05/24/2022 0821   ALBUMIN 4.4 05/24/2022 0821   AST 12 05/24/2022 0821   ALT 13 05/24/2022 0821   ALKPHOS 73 05/24/2022 0821   BILITOT 0.4 05/24/2022 0821      Component Value Date/Time   TSH 3.060 07/02/2019 1158   Nutritional Lab Results  Component Value Date   VD25OH 46.3 02/18/2022   VD25OH 48.0 11/02/2021   VD25OH 55.2 06/25/2021     ASSESSMENT AND PLAN  TREATMENT PLAN FOR OBESITY:  Recommended Dietary Goals  Seham is currently in the action stage of change. As such, her goal is to continue weight management plan. She has agreed to the Category 2 Plan.  Behavioral Intervention  We discussed the following Behavioral Modification Strategies today: increasing lean protein intake, decreasing simple carbohydrates , increasing vegetables, increasing lower glycemic fruits, increasing  water intake, work on meal planning and preparation, keeping healthy foods at home, continue to practice mindfulness when eating, and planning for success.  Additional resources provided today: NA  Recommended Physical Activity Goals  Tesslyn has been advised to work up to 150 minutes of moderate intensity aerobic activity a week and strengthening exercises 2-3 times per week for cardiovascular health, weight loss maintenance and preservation of muscle mass.   She has agreed to Think about ways to increase daily physical activity and overcoming barriers to exercise and Increase physical activity in their day and reduce sedentary time (increase  NEAT).   Pharmacotherapy We discussed various medication options to help Harriett Sine with her weight loss efforts and we both agreed to start Contrave.  Side effects discussed.   She would benefit from continuing Wegovy due to history of CVA, HTN, HLD, OSA and pre diabetes.  See SELECT study.     Avoid Phentermine and Qsymia due to HTN and history of CVA.  She is also taking Zonegran.  She had side effects to topamax.    Avoid Orlistat due to Vit D def  ASSOCIATED CONDITIONS ADDRESSED TODAY  Action/Plan  Vitamin D deficiency -     Vitamin D (Ergocalciferol); Take 1 capsule (50,000 Units total) by mouth every 7 (seven) days.  Dispense: 12 capsule; Refill: 0 -     VITAMIN D 25 Hydroxy (Vit-D Deficiency, Fractures) -     Comprehensive metabolic panel -     CBC with Differential/Platelet  Pre-diabetes -     Hemoglobin A1c -     Comprehensive metabolic panel -     Insulin, random -     CBC with Differential/Platelet  Tressy will continue to work on weight loss, exercise, and decreasing simple carbohydrates to help decrease the risk of diabetes.    Essential hypertension -     Lisinopril; Take 1 tablet (5 mg total) by mouth daily.  Dispense: 90 tablet; Refill: 0 -     Comprehensive metabolic panel -     CBC with Differential/Platelet  Mixed  hyperlipidemia -     Lipid Panel With LDL/HDL Ratio -     CBC with Differential/Platelet  Cardiovascular risk and specific lipid/LDL goals reviewed.  We discussed several lifestyle modifications today and Taziah will continue to work on diet, exercise and weight loss efforts. Orders and follow up as documented in patient record.   Counseling Intensive lifestyle modifications are the first line treatment for this issue. Dietary changes: Increase soluble fiber. Decrease simple carbohydrates. Exercise changes: Moderate to vigorous-intensity aerobic activity 150 minutes per week if tolerated. Lipid-lowering medications: see documented in medical record.   Medication management -     Comprehensive metabolic panel -     Vitamin B12 -     CBC with Differential/Platelet  Generalized obesity Start Contrave as directed.  Side effects discussed. Avoid alcohol and narcotics while taking.  -     Comprehensive metabolic panel -     CBC with Differential/Platelet  BMI 35.0-35.9,adult -     Comprehensive metabolic panel -     CBC with Differential/Platelet         Return in about 3 weeks (around 10/28/2022).Marland Kitchen She was informed of the importance of frequent follow up visits to maximize her success with intensive lifestyle modifications for her multiple health conditions.   ATTESTASTION STATEMENTS:  Reviewed by clinician on day of visit: allergies, medications, problem list, medical history, surgical history, family history, social history, and previous encounter notes.     Theodis Sato. Bellina Tokarczyk FNP-C

## 2022-10-07 NOTE — Patient Instructions (Signed)

## 2022-10-08 LAB — CBC WITH DIFFERENTIAL/PLATELET
Basophils Absolute: 0 10*3/uL (ref 0.0–0.2)
Basos: 0 %
EOS (ABSOLUTE): 0.1 10*3/uL (ref 0.0–0.4)
Eos: 1 %
Hematocrit: 42 % (ref 34.0–46.6)
Hemoglobin: 14.3 g/dL (ref 11.1–15.9)
Immature Grans (Abs): 0 10*3/uL (ref 0.0–0.1)
Immature Granulocytes: 0 %
Lymphocytes Absolute: 2 10*3/uL (ref 0.7–3.1)
Lymphs: 22 %
MCH: 30.5 pg (ref 26.6–33.0)
MCHC: 34 g/dL (ref 31.5–35.7)
MCV: 90 fL (ref 79–97)
Monocytes Absolute: 0.5 10*3/uL (ref 0.1–0.9)
Monocytes: 6 %
Neutrophils Absolute: 6.4 10*3/uL (ref 1.4–7.0)
Neutrophils: 71 %
Platelets: 247 10*3/uL (ref 150–450)
RBC: 4.69 x10E6/uL (ref 3.77–5.28)
RDW: 12.6 % (ref 11.7–15.4)
WBC: 9.1 10*3/uL (ref 3.4–10.8)

## 2022-10-08 LAB — HEMOGLOBIN A1C
Est. average glucose Bld gHb Est-mCnc: 123 mg/dL
Hgb A1c MFr Bld: 5.9 % — ABNORMAL HIGH (ref 4.8–5.6)

## 2022-10-08 LAB — COMPREHENSIVE METABOLIC PANEL
ALT: 16 IU/L (ref 0–32)
AST: 17 IU/L (ref 0–40)
Albumin/Globulin Ratio: 1.7
Albumin: 4.4 g/dL (ref 3.9–4.9)
Alkaline Phosphatase: 71 IU/L (ref 44–121)
BUN/Creatinine Ratio: 17 (ref 9–23)
BUN: 16 mg/dL (ref 6–24)
Bilirubin Total: 0.3 mg/dL (ref 0.0–1.2)
CO2: 18 mmol/L — ABNORMAL LOW (ref 20–29)
Calcium: 9.3 mg/dL (ref 8.7–10.2)
Chloride: 108 mmol/L — ABNORMAL HIGH (ref 96–106)
Creatinine, Ser: 0.93 mg/dL (ref 0.57–1.00)
Globulin, Total: 2.6 g/dL (ref 1.5–4.5)
Glucose: 108 mg/dL — ABNORMAL HIGH (ref 70–99)
Potassium: 4.7 mmol/L (ref 3.5–5.2)
Sodium: 141 mmol/L (ref 134–144)
Total Protein: 7 g/dL (ref 6.0–8.5)
eGFR: 76 mL/min/{1.73_m2} (ref >59)

## 2022-10-08 LAB — VITAMIN B12: Vitamin B-12: 717 pg/mL (ref 232–1245)

## 2022-10-08 LAB — INSULIN, RANDOM: INSULIN: 18 u[IU]/mL (ref 2.6–24.9)

## 2022-10-08 LAB — LIPID PANEL WITH LDL/HDL RATIO
Cholesterol, Total: 201 mg/dL — ABNORMAL HIGH (ref 100–199)
HDL: 51 mg/dL (ref >39)
LDL Chol Calc (NIH): 131 mg/dL — ABNORMAL HIGH (ref 0–99)
LDL/HDL Ratio: 2.6 ratio (ref 0.0–3.2)
Triglycerides: 103 mg/dL (ref 0–149)
VLDL Cholesterol Cal: 19 mg/dL (ref 5–40)

## 2022-10-08 LAB — VITAMIN D 25 HYDROXY (VIT D DEFICIENCY, FRACTURES): Vit D, 25-Hydroxy: 49.1 ng/mL (ref 30.0–100.0)

## 2022-10-11 NOTE — Telephone Encounter (Signed)
Received fax from [facility]. Contrave approved 10/07/22-10/07/23.

## 2022-11-04 ENCOUNTER — Ambulatory Visit: Payer: BC Managed Care – PPO | Admitting: Nurse Practitioner

## 2022-11-04 ENCOUNTER — Encounter: Payer: Self-pay | Admitting: Nurse Practitioner

## 2022-11-04 VITALS — BP 111/77 | HR 85 | Temp 98.1°F | Ht 67.0 in | Wt 225.0 lb

## 2022-11-04 DIAGNOSIS — Z6835 Body mass index (BMI) 35.0-35.9, adult: Secondary | ICD-10-CM | POA: Diagnosis not present

## 2022-11-04 DIAGNOSIS — R7303 Prediabetes: Secondary | ICD-10-CM

## 2022-11-04 DIAGNOSIS — E669 Obesity, unspecified: Secondary | ICD-10-CM | POA: Diagnosis not present

## 2022-11-04 DIAGNOSIS — E782 Mixed hyperlipidemia: Secondary | ICD-10-CM | POA: Diagnosis not present

## 2022-11-04 MED ORDER — CONTRAVE 8-90 MG PO TB12
ORAL_TABLET | ORAL | 0 refills | Status: DC
Start: 2022-11-04 — End: 2022-12-14

## 2022-11-04 MED ORDER — METFORMIN HCL 500 MG PO TABS
ORAL_TABLET | ORAL | 0 refills | Status: DC
Start: 2022-11-04 — End: 2023-02-07

## 2022-11-04 NOTE — Progress Notes (Signed)
Office: [phone]  /  Fax: [phone]  WEIGHT SUMMARY AND BIOMETRICS  Weight Lost Since Last Visit: 1lb  Weight Gained Since Last Visit: 0lb   Vitals Temp: 98.1 F (36.7 C) BP: 111/77 Pulse Rate: 85 SpO2: 98 %   Anthropometric Measurements Height: 5\' 7"  (1.702 m) Weight: 225 lb (102.1 kg) BMI (Calculated): 35.23 Weight at Last Visit: 226lb Weight Lost Since Last Visit: 1lb Weight Gained Since Last Visit: 0lb Starting Weight: 242lb Total Weight Loss (lbs): 17 lb (7.711 kg)   Body Composition  Body Fat %: 40.8 % Fat Mass (lbs): 92 lbs Muscle Mass (lbs): 126.8 lbs Total Body Water (lbs): 84.4 lbs Visceral Fat Rating : 10   Other Clinical Data Fasting: No Labs: No Today's Visit #: 58 Starting Date: 07/02/19     HPI  Chief Complaint: OBESITY  Shelley Ryan is here to discuss her progress with her obesity treatment plan. She is on the the Category 2 Plan and states she is following her eating plan approximately 80 % of the time. She states she is exercising 60 minutes 1 days per week.   Interval History:  Since last office visit she has lost 1 pound.  She is trying to focus on protein intake.  She is struggling with an ear infection.  She is currently taking Augmentin.  Prior to that she was treated with zithromax, cortisporin and polytrim.  She is drinking water and G2.   She hasn't been able to exercise due to not feeling well.     Pharmacotherapy for weight loss: She is currently taking Contrave  2am 1 po for medical weight loss (increased this week). She didn't want to increase the dose at time because of decreased appetite.  Denies side effects.  (Received fax from [facility]. Contrave approved 10/07/22-10/07/23.)   Previous pharmacotherapy for medical weight loss:  Wegovy, Phentermine and Saxenda.  Unable to continue GLP-1s due to cost/coverage.  She stopped Phentermine due to not being effective.   Bariatric surgery:  Patient has not had bariatric  surgery.    Hyperlipidemia Medication(s): not currently on meds-stopped Zetia due to side effects of leg cramps/myalgias.  FH:  mother and father  Lab Results  Component Value Date   CHOL 201 (H) 10/07/2022   HDL 51 10/07/2022   LDLCALC 131 (H) 10/07/2022   TRIG 103 10/07/2022   CHOLHDL 4.0 02/18/2022   Lab Results  Component Value Date   ALT 16 10/07/2022   AST 17 10/07/2022   ALKPHOS 71 10/07/2022   BILITOT 0.3 10/07/2022   The ASCVD Risk score (Arnett DK, et al., 2019) failed to calculate for the following reasons:   The patient has a prior MI or stroke diagnosis   Prediabetes Last A1c was 5.9  Medication(s): Metformin 500mg  BID.  Denies side effects.  Polyphagia:No Lab Results  Component Value Date   HGBA1C 5.9 (H) 10/07/2022   HGBA1C 5.9 (H) 05/24/2022   HGBA1C 6.0 (H) 02/18/2022   HGBA1C 6.0 (H) 11/02/2021   HGBA1C 5.7 (H) 06/25/2021   Lab Results  Component Value Date   INSULIN 18.0 10/07/2022   INSULIN 13.4 05/24/2022   INSULIN 15.8 02/18/2022   INSULIN 9.4 11/02/2021   INSULIN 18.2 06/25/2021   PHYSICAL EXAM:  Blood pressure 111/77, pulse 85, temperature 98.1 F (36.7 C), height 5\' 7"  (1.702 m), weight 225 lb (102.1 kg), SpO2 98%. Body mass index is 35.24 kg/m.  General: She is overweight, cooperative, alert, well developed, and in no acute distress. PSYCH:  Has normal mood, affect and thought process.   Extremities: No edema.  Neurologic: No gross sensory or motor deficits. No tremors or fasciculations noted.    DIAGNOSTIC DATA REVIEWED:  BMET    Component Value Date/Time   NA 141 10/07/2022 0805   K 4.7 10/07/2022 0805   CL 108 (H) 10/07/2022 0805   CO2 18 (L) 10/07/2022 0805   GLUCOSE 108 (H) 10/07/2022 0805   GLUCOSE 96 08/29/2019 0549   BUN 16 10/07/2022 0805   CREATININE 0.93 10/07/2022 0805   CALCIUM 9.3 10/07/2022 0805   GFRNONAA 84 05/27/2020 0938   GFRAA 96 05/27/2020 0938   Lab Results  Component Value Date   HGBA1C 5.9 (H)  10/07/2022   HGBA1C 6.1 (H) 06/19/2019   Lab Results  Component Value Date   INSULIN 18.0 10/07/2022   INSULIN 13.7 07/02/2019   Lab Results  Component Value Date   TSH 3.060 07/02/2019   CBC    Component Value Date/Time   WBC 9.1 10/07/2022 0805   WBC 12.1 (H) 08/29/2019 0549   RBC 4.69 10/07/2022 0805   RBC 4.54 08/29/2019 0549   HGB 14.3 10/07/2022 0805   HCT 42.0 10/07/2022 0805   PLT 247 10/07/2022 0805   MCV 90 10/07/2022 0805   MCH 30.5 10/07/2022 0805   MCH 30.0 08/29/2019 0549   MCHC 34.0 10/07/2022 0805   MCHC 32.9 08/29/2019 0549   RDW 12.6 10/07/2022 0805   Iron Studies No results found for: "IRON", "TIBC", "FERRITIN", "IRONPCTSAT" Lipid Panel     Component Value Date/Time   CHOL 201 (H) 10/07/2022 0805   TRIG 103 10/07/2022 0805   HDL 51 10/07/2022 0805   CHOLHDL 4.0 02/18/2022 1132   LDLCALC 131 (H) 10/07/2022 0805   Hepatic Function Panel     Component Value Date/Time   PROT 7.0 10/07/2022 0805   ALBUMIN 4.4 10/07/2022 0805   AST 17 10/07/2022 0805   ALT 16 10/07/2022 0805   ALKPHOS 71 10/07/2022 0805   BILITOT 0.3 10/07/2022 0805      Component Value Date/Time   TSH 3.060 07/02/2019 1158   Nutritional Lab Results  Component Value Date   VD25OH 49.1 10/07/2022   VD25OH 46.3 02/18/2022   VD25OH 48.0 11/02/2021     ASSESSMENT AND PLAN  TREATMENT PLAN FOR OBESITY:  Recommended Dietary Goals  Shelley Ryan is currently in the action stage of change. As such, her goal is to continue weight management plan. She has agreed to the Category 2 Plan.  Behavioral Intervention  We discussed the following Behavioral Modification Strategies today: increasing lean protein intake, decreasing simple carbohydrates , increasing vegetables, increasing lower glycemic fruits, increasing water intake, work on tracking and journaling calories using tracking application, reading food labels , keeping healthy foods at home, continue to practice mindfulness when  eating, and planning for success.  Additional resources provided today: NA  Recommended Physical Activity Goals  Shelley Ryan has been advised to work up to 150 minutes of moderate intensity aerobic activity a week and strengthening exercises 2-3 times per week for cardiovascular health, weight loss maintenance and preservation of muscle mass.   She has agreed to Continue current level of physical activity , Think about ways to increase daily physical activity and overcoming barriers to exercise, and Increase physical activity in their day and reduce sedentary time (increase NEAT).   Pharmacotherapy We discussed various medication options to help Shelley Ryan with her weight loss efforts and we both agreed to continue Contrave and increase  as tolerated.  She would benefit from taking Wegovy due to history of CVA, HTN, HLD, OSA and pre diabetes.  See SELECT study.     Avoid Phentermine and Qsymia due to HTN and history of CVA.  She is also taking Zonegran.  She had side effects to topamax.     Avoid Orlistat due to Vit D def    ASSOCIATED CONDITIONS ADDRESSED TODAY  Action/Plan  Mixed hyperlipidemia Cardiovascular risk and specific lipid/LDL goals reviewed.  We discussed several lifestyle modifications today and Shelley Ryan will continue to work on diet, exercise and weight loss efforts. Orders and follow up as documented in patient record.   Counseling Intensive lifestyle modifications are the first line treatment for this issue. Dietary changes: Increase soluble fiber. Decrease simple carbohydrates. Exercise changes: Moderate to vigorous-intensity aerobic activity 150 minutes per week if tolerated. Lipid-lowering medications: see documented in medical record.   To discuss with PCP  Prediabetes -     metFORMIN HCl; TAKE 1 TABLET BY MOUTH TWICE DAILY  Dispense: 180 tablet; Refill: 0  Shelley Ryan will continue to work on weight loss, exercise, and decreasing simple carbohydrates to help decrease the risk  of diabetes.    Generalized obesity -     Contrave; Take 2 po BID daily. .  Dispense: 120 tablet; Refill: 0  Will continue 2am and 1 pm until next visit.   BMI 35.0-35.9,adult -     Contrave; Take 2 po BID daily. .  Dispense: 120 tablet; Refill: 0      Labs reviewed in chart from 10/07/22 with patient   Return in about 4 weeks (around 12/02/2022).Marland Kitchen She was informed of the importance of frequent follow up visits to maximize her success with intensive lifestyle modifications for her multiple health conditions.   ATTESTASTION STATEMENTS:  Reviewed by clinician on day of visit: allergies, medications, problem list, medical history, surgical history, family history, social history, and previous encounter notes.     Theodis Sato. Shelley Behlke FNP-C

## 2022-12-14 ENCOUNTER — Encounter: Payer: Self-pay | Admitting: Nurse Practitioner

## 2022-12-14 ENCOUNTER — Ambulatory Visit: Payer: BC Managed Care – PPO | Admitting: Nurse Practitioner

## 2022-12-14 VITALS — BP 139/81 | HR 88 | Temp 98.1°F | Ht 67.0 in | Wt 226.0 lb

## 2022-12-14 DIAGNOSIS — E669 Obesity, unspecified: Secondary | ICD-10-CM

## 2022-12-14 DIAGNOSIS — Z6835 Body mass index (BMI) 35.0-35.9, adult: Secondary | ICD-10-CM

## 2022-12-14 DIAGNOSIS — G4733 Obstructive sleep apnea (adult) (pediatric): Secondary | ICD-10-CM

## 2022-12-14 DIAGNOSIS — E559 Vitamin D deficiency, unspecified: Secondary | ICD-10-CM | POA: Diagnosis not present

## 2022-12-14 DIAGNOSIS — R7303 Prediabetes: Secondary | ICD-10-CM

## 2022-12-14 DIAGNOSIS — I10 Essential (primary) hypertension: Secondary | ICD-10-CM

## 2022-12-14 MED ORDER — VITAMIN D (ERGOCALCIFEROL) 1.25 MG (50000 UNIT) PO CAPS
50000.0000 [IU] | ORAL_CAPSULE | ORAL | 0 refills | Status: DC
Start: 2022-12-14 — End: 2023-06-07

## 2022-12-14 MED ORDER — LIRAGLUTIDE 18 MG/3ML ~~LOC~~ SOPN: PEN_INJECTOR | SUBCUTANEOUS | 0 refills | Status: DC

## 2022-12-14 MED ORDER — CONTRAVE 8-90 MG PO TB12
ORAL_TABLET | ORAL | 0 refills | Status: DC
Start: 2022-12-14 — End: 2023-01-10

## 2022-12-14 NOTE — Patient Instructions (Signed)

## 2022-12-14 NOTE — Progress Notes (Signed)
Office: [phone]  /  Fax: [phone]  WEIGHT SUMMARY AND BIOMETRICS  Weight Lost Since Last Visit: 0lb  Weight Gained Since Last Visit: 1lb   Vitals Temp: 98.1 F (36.7 C) BP: 139/81 Pulse Rate: 88 SpO2: 95 %   Anthropometric Measurements Height: 5\' 7"  (1.702 m) Weight: 226 lb (102.5 kg) BMI (Calculated): 35.39 Weight at Last Visit: 225lb Weight Lost Since Last Visit: 0lb Weight Gained Since Last Visit: 1lb Starting Weight: 242lb Total Weight Loss (lbs): 16 lb (7.258 kg)   Body Composition  Body Fat %: 41.1 % Fat Mass (lbs): 93.2 lbs Muscle Mass (lbs): 126.6 lbs Total Body Water (lbs): 83.2 lbs Visceral Fat Rating : 11   Other Clinical Data Fasting: Yes Labs: No Today's Visit #: 19 Starting Date: 07/02/19     HPI  Chief Complaint: OBESITY  Shelley Ryan is here to discuss her progress with her obesity treatment plan. She is on the the Category 2 Plan and states she is following her eating plan approximately 80 % of the time. She states she is exercising 0 minutes 0 days per week.   Interval History:  Since last office visit she has gained 1 pound.  She is drinking water daily.  Denies sugary drinks.       Pharmacotherapy for weight loss: She is currently taking Contrave 2 po BID for medical weight loss.  Denies side effects.  Can't tell a difference in appetite since increasing the dose-reports polyphagia, does help with cravings.    Previous pharmacotherapy for medical weight loss:  Wegovy, Phentermine and Saxenda.  Unable to continue GLP-1s due to cost/coverage.  She stopped Phentermine due to not being effective.   Bariatric surgery:  Patient has not had bariatric surgery.    Prediabetes Last A1c was 5.9  Medication(s): Metformin 500mg  BID.  Denies side effects Polyphagia:Yes Lab Results  Component Value Date   HGBA1C 5.9 (H) 10/07/2022   HGBA1C 5.9 (H) 05/24/2022   HGBA1C 6.0 (H) 02/18/2022   HGBA1C 6.0 (H) 11/02/2021   HGBA1C 5.7 (H)  06/25/2021   Lab Results  Component Value Date   INSULIN 18.0 10/07/2022   INSULIN 13.4 05/24/2022   INSULIN 15.8 02/18/2022   INSULIN 9.4 11/02/2021   INSULIN 18.2 06/25/2021   Obstructive Sleep Apnea Shelley Ryan has a diagnosis of sleep apnea. She reports that she is using oral device regularly.  Hypertension Hypertension stable.  She has a history of CVA. Medication(s): Lisinopril 5mg .  Denies side effects Denies chest pain, palpitations and SOB.  BP Readings from Last 3 Encounters:  12/14/22 139/81  11/04/22 111/77  10/07/22 117/78   Lab Results  Component Value Date   CREATININE 0.93 10/07/2022   CREATININE 0.92 05/24/2022   CREATININE 0.85 02/18/2022       Vit D deficiency  She is taking Vit D 50,000 IU weekly.  Denies side effects.  Denies nausea, vomiting or muscle weakness.    Lab Results  Component Value Date   VD25OH 49.1 10/07/2022   VD25OH 46.3 02/18/2022   VD25OH 48.0 11/02/2021    PHYSICAL EXAM:  Blood pressure 139/81, pulse 88, temperature 98.1 F (36.7 C), height 5\' 7"  (1.702 m), weight 226 lb (102.5 kg), SpO2 95%. Body mass index is 35.4 kg/m.  General: She is overweight, cooperative, alert, well developed, and in no acute distress. PSYCH: Has normal mood, affect and thought process.   Extremities: No edema.  Neurologic: No gross sensory or motor deficits. No tremors or fasciculations noted.  DIAGNOSTIC DATA REVIEWED:  BMET    Component Value Date/Time   NA 141 10/07/2022 0805   K 4.7 10/07/2022 0805   CL 108 (H) 10/07/2022 0805   CO2 18 (L) 10/07/2022 0805   GLUCOSE 108 (H) 10/07/2022 0805   GLUCOSE 96 08/29/2019 0549   BUN 16 10/07/2022 0805   CREATININE 0.93 10/07/2022 0805   CALCIUM 9.3 10/07/2022 0805   GFRNONAA 84 05/27/2020 0938   GFRAA 96 05/27/2020 0938   Lab Results  Component Value Date   HGBA1C 5.9 (H) 10/07/2022   HGBA1C 6.1 (H) 06/19/2019   Lab Results  Component Value Date   INSULIN 18.0 10/07/2022   INSULIN 13.7  07/02/2019   Lab Results  Component Value Date   TSH 3.060 07/02/2019   CBC    Component Value Date/Time   WBC 9.1 10/07/2022 0805   WBC 12.1 (H) 08/29/2019 0549   RBC 4.69 10/07/2022 0805   RBC 4.54 08/29/2019 0549   HGB 14.3 10/07/2022 0805   HCT 42.0 10/07/2022 0805   PLT 247 10/07/2022 0805   MCV 90 10/07/2022 0805   MCH 30.5 10/07/2022 0805   MCH 30.0 08/29/2019 0549   MCHC 34.0 10/07/2022 0805   MCHC 32.9 08/29/2019 0549   RDW 12.6 10/07/2022 0805   Iron Studies No results found for: "IRON", "TIBC", "FERRITIN", "IRONPCTSAT" Lipid Panel     Component Value Date/Time   CHOL 201 (H) 10/07/2022 0805   TRIG 103 10/07/2022 0805   HDL 51 10/07/2022 0805   CHOLHDL 4.0 02/18/2022 1132   LDLCALC 131 (H) 10/07/2022 0805   Hepatic Function Panel     Component Value Date/Time   PROT 7.0 10/07/2022 0805   ALBUMIN 4.4 10/07/2022 0805   AST 17 10/07/2022 0805   ALT 16 10/07/2022 0805   ALKPHOS 71 10/07/2022 0805   BILITOT 0.3 10/07/2022 0805      Component Value Date/Time   TSH 3.060 07/02/2019 1158   Nutritional Lab Results  Component Value Date   VD25OH 49.1 10/07/2022   VD25OH 46.3 02/18/2022   VD25OH 48.0 11/02/2021     ASSESSMENT AND PLAN  TREATMENT PLAN FOR OBESITY:  Recommended Dietary Goals  Shelley Ryan is currently in the action stage of change. As such, her goal is to continue weight management plan. She has agreed to the Category 2 Plan.  I've recommended tracking and will review at next visit.    Behavioral Intervention  We discussed the following Behavioral Modification Strategies today: increasing lean protein intake, decreasing simple carbohydrates , increasing vegetables, increasing lower glycemic fruits, increasing water intake, reading food labels , keeping healthy foods at home, continue to practice mindfulness when eating, and planning for success.  Additional resources provided today: NA  Recommended Physical Activity Goals  Shelley Ryan has  been advised to work up to 150 minutes of moderate intensity aerobic activity a week and strengthening exercises 2-3 times per week for cardiovascular health, weight loss maintenance and preservation of muscle mass.   She has agreed to Think about ways to increase daily physical activity and overcoming barriers to exercise and Increase physical activity in their day and reduce sedentary time (increase NEAT).   Pharmacotherapy We discussed various medication options to help Shelley Ryan with her weight loss efforts and we both agreed to start Victoza off label for weight loss.  She will titrate down on Contrave as she titrates up on on Victoza.  Side effects discussed.   Contraindications:  Pancreatitis (active gallstones) Medullary thyroid cancer High  triglycerides (>500)-will need labs prior to starting Multiple Endocrine Neoplasia syndrome type 2 (MEN 2) Trying to get pregnant Breastfeeding Use with caution with taking insulin or sulfonylureas (will need to monitor blood sugars for hypoglycemia)  ASSOCIATED CONDITIONS ADDRESSED TODAY  Action/Plan  Prediabetes Continue Metformin as directed  Shelley Ryan will continue to work on weight loss, exercise, and decreasing simple carbohydrates to help decrease the risk of diabetes.    OSA (obstructive sleep apnea) Continue oral device nightly  Essential hypertension Continue Lisinopril as directed  Generalized obesity -     Liraglutide; Take 0.6mg  SQ daily x 1 week, then take  1.2mg  SQ daily x 1 week and then take 1.8mg  SQ daily.  Dispense: 9 mL; Refill: 0 -     Contrave; Take 2 po BID daily. .  Dispense: 120 tablet; Refill: 0  BMI 35.0-35.9,adult -     Liraglutide; Take 0.6mg  SQ daily x 1 week, then take  1.2mg  SQ daily x 1 week and then take 1.8mg  SQ daily.  Dispense: 9 mL; Refill: 0 -     Contrave; Take 2 po BID daily. .  Dispense: 120 tablet; Refill: 0  Vitamin D deficiency -     Vitamin D (Ergocalciferol); Take 1 capsule (50,000 Units  total) by mouth every 7 (seven) days.  Dispense: 12 capsule; Refill: 0       Return in about 4 weeks (around 01/11/2023).Marland Kitchen She was informed of the importance of frequent follow up visits to maximize her success with intensive lifestyle modifications for her multiple health conditions.   ATTESTASTION STATEMENTS:  Reviewed by clinician on day of visit: allergies, medications, problem list, medical history, surgical history, family history, social history, and previous encounter notes.     Shelley Ryan. Shelley Amey FNP-C

## 2023-01-07 ENCOUNTER — Other Ambulatory Visit: Payer: Self-pay | Admitting: Family Medicine

## 2023-01-07 DIAGNOSIS — Z1231 Encounter for screening mammogram for malignant neoplasm of breast: Secondary | ICD-10-CM

## 2023-01-10 ENCOUNTER — Encounter: Payer: Self-pay | Admitting: Nurse Practitioner

## 2023-01-10 ENCOUNTER — Ambulatory Visit: Payer: BC Managed Care – PPO | Admitting: Nurse Practitioner

## 2023-01-10 VITALS — BP 137/82 | HR 83 | Temp 98.0°F | Ht 67.0 in | Wt 223.0 lb

## 2023-01-10 DIAGNOSIS — Z6834 Body mass index (BMI) 34.0-34.9, adult: Secondary | ICD-10-CM

## 2023-01-10 DIAGNOSIS — E669 Obesity, unspecified: Secondary | ICD-10-CM

## 2023-01-10 DIAGNOSIS — I10 Essential (primary) hypertension: Secondary | ICD-10-CM

## 2023-01-10 MED ORDER — LISINOPRIL 5 MG PO TABS
5.0000 mg | ORAL_TABLET | Freq: Every day | ORAL | 0 refills | Status: DC
Start: 2023-01-10 — End: 2023-06-07

## 2023-01-10 NOTE — Progress Notes (Signed)
Office: [phone]  /  Fax: [phone]  WEIGHT SUMMARY AND BIOMETRICS  Weight Lost Since Last Visit: 3lb  Weight Gained Since Last Visit: 0lb   Vitals Temp: 98 F (36.7 C) BP: 137/82 Pulse Rate: 83 SpO2: 95 %   Anthropometric Measurements Height: 5\' 7"  (1.702 m) Weight: 223 lb (101.2 kg) BMI (Calculated): 34.92 Weight at Last Visit: 226lb Weight Lost Since Last Visit: 3lb Weight Gained Since Last Visit: 0lb Starting Weight: 242lb Total Weight Loss (lbs): 19 lb (8.618 kg)   Body Composition  Body Fat %: 41.3 % Fat Mass (lbs): 92.2 lbs Muscle Mass (lbs): 124.6 lbs Total Body Water (lbs): 83.4 lbs Visceral Fat Rating : 11   Other Clinical Data Fasting: No Labs: No Today's Visit #: 66 Starting Date: 07/02/19     HPI  Chief Complaint: OBESITY  Shelley Ryan is here to discuss her progress with her obesity treatment plan. She is on the the Category 2 Plan and states she is following her eating plan approximately 90 % of the time. She states she is exercising 0 minutes 0 days per week. Patient does walk at least 5,000 steps daily at her job.   Interval History:  Since last office visit she has lost 3 pounds.  She is averaging around 1450 calories and 80+ grams of protein daily.   She saw Dr. Barnetta Chapel for evaluation for bariatric surgery on 01/03/23. She is scheduled with their RD and their therapist for surgical clearance.     Pharmacotherapy for weight loss: She is currently taking Contrave 2 po BID for medical weight loss.  Denies side effects.    Previous pharmacotherapy for medical weight loss:  Wegovy, Phentermine and Saxenda. Unable to continue GLP-1s due to cost/coverage. She stopped Phentermine due to not being effective.   Bariatric surgery:  Patient has not had bariatric surgery  Hypertension Hypertension stable.  Medication(s): lisinopril 5mg  Denies chest pain, palpitations and SOB.  BP Readings from Last 3 Encounters:  01/10/23 137/82  12/14/22  139/81  11/04/22 111/77   Lab Results  Component Value Date   CREATININE 0.93 10/07/2022   CREATININE 0.92 05/24/2022   CREATININE 0.85 02/18/2022      PHYSICAL EXAM:  Blood pressure 137/82, pulse 83, temperature 98 F (36.7 C), height 5\' 7"  (1.702 m), weight 223 lb (101.2 kg), SpO2 95%. Body mass index is 34.93 kg/m.  General: She is overweight, cooperative, alert, well developed, and in no acute distress. PSYCH: Has normal mood, affect and thought process.   Extremities: No edema.  Neurologic: No gross sensory or motor deficits. No tremors or fasciculations noted.    DIAGNOSTIC DATA REVIEWED:  BMET    Component Value Date/Time   NA 141 10/07/2022 0805   K 4.7 10/07/2022 0805   CL 108 (H) 10/07/2022 0805   CO2 18 (L) 10/07/2022 0805   GLUCOSE 108 (H) 10/07/2022 0805   GLUCOSE 96 08/29/2019 0549   BUN 16 10/07/2022 0805   CREATININE 0.93 10/07/2022 0805   CALCIUM 9.3 10/07/2022 0805   GFRNONAA 84 05/27/2020 0938   GFRAA 96 05/27/2020 0938   Lab Results  Component Value Date   HGBA1C 5.9 (H) 10/07/2022   HGBA1C 6.1 (H) 06/19/2019   Lab Results  Component Value Date   INSULIN 18.0 10/07/2022   INSULIN 13.7 07/02/2019   Lab Results  Component Value Date   TSH 3.060 07/02/2019   CBC    Component Value Date/Time   WBC 9.1 10/07/2022 0805   WBC 12.1 (  H) 08/29/2019 0549   RBC 4.69 10/07/2022 0805   RBC 4.54 08/29/2019 0549   HGB 14.3 10/07/2022 0805   HCT 42.0 10/07/2022 0805   PLT 247 10/07/2022 0805   MCV 90 10/07/2022 0805   MCH 30.5 10/07/2022 0805   MCH 30.0 08/29/2019 0549   MCHC 34.0 10/07/2022 0805   MCHC 32.9 08/29/2019 0549   RDW 12.6 10/07/2022 0805   Iron Studies No results found for: "IRON", "TIBC", "FERRITIN", "IRONPCTSAT" Lipid Panel     Component Value Date/Time   CHOL 201 (H) 10/07/2022 0805   TRIG 103 10/07/2022 0805   HDL 51 10/07/2022 0805   CHOLHDL 4.0 02/18/2022 1132   LDLCALC 131 (H) 10/07/2022 0805   Hepatic Function  Panel     Component Value Date/Time   PROT 7.0 10/07/2022 0805   ALBUMIN 4.4 10/07/2022 0805   AST 17 10/07/2022 0805   ALT 16 10/07/2022 0805   ALKPHOS 71 10/07/2022 0805   BILITOT 0.3 10/07/2022 0805      Component Value Date/Time   TSH 3.060 07/02/2019 1158   Nutritional Lab Results  Component Value Date   VD25OH 49.1 10/07/2022   VD25OH 46.3 02/18/2022   VD25OH 48.0 11/02/2021     ASSESSMENT AND PLAN  TREATMENT PLAN FOR OBESITY:  Recommended Dietary Goals  Shelley Ryan is currently in the action stage of change. As such, her goal is to continue weight management plan. She has agreed to the Category 2 Plan.  Behavioral Intervention  We discussed the following Behavioral Modification Strategies today: increasing lean protein intake, decreasing simple carbohydrates , increasing vegetables, increasing lower glycemic fruits, increasing water intake, work on meal planning and preparation, reading food labels , keeping healthy foods at home, continue to practice mindfulness when eating, and planning for success.  Additional resources provided today: NA  Recommended Physical Activity Goals  Shelley Ryan has been advised to work up to 150 minutes of moderate intensity aerobic activity a week and strengthening exercises 2-3 times per week for cardiovascular health, weight loss maintenance and preservation of muscle mass.   She has agreed to Think about ways to increase daily physical activity and overcoming barriers to exercise and Increase physical activity in their day and reduce sedentary time (increase NEAT).   Pharmacotherapy We discussed various medication options to help Shelley Ryan with her weight loss efforts and we both agreed to stop Contrave.  ASSOCIATED CONDITIONS ADDRESSED TODAY  Action/Plan  Essential hypertension -     Lisinopril; Take 1 tablet (5 mg total) by mouth daily.  Dispense: 90 tablet; Refill: 0.  Side effects discussed.  Will continue to monitor.   Generalized  obesity  BMI 34.0-34.9,adult         Return in about 4 weeks (around 02/07/2023).Marland Kitchen She was informed of the importance of frequent follow up visits to maximize her success with intensive lifestyle modifications for her multiple health conditions.   ATTESTASTION STATEMENTS:  Reviewed by clinician on day of visit: allergies, medications, problem list, medical history, surgical history, family history, social history, and previous encounter notes.     Theodis Sato. Chrissie Dacquisto FNP-C

## 2023-01-24 ENCOUNTER — Ambulatory Visit: Payer: BC Managed Care – PPO

## 2023-02-07 ENCOUNTER — Ambulatory Visit: Payer: BC Managed Care – PPO | Admitting: Nurse Practitioner

## 2023-02-07 ENCOUNTER — Encounter: Payer: Self-pay | Admitting: Nurse Practitioner

## 2023-02-07 VITALS — BP 133/82 | HR 83 | Temp 98.2°F | Wt 226.0 lb

## 2023-02-07 DIAGNOSIS — Z6835 Body mass index (BMI) 35.0-35.9, adult: Secondary | ICD-10-CM | POA: Diagnosis not present

## 2023-02-07 DIAGNOSIS — E669 Obesity, unspecified: Secondary | ICD-10-CM

## 2023-02-07 DIAGNOSIS — I10 Essential (primary) hypertension: Secondary | ICD-10-CM

## 2023-02-07 DIAGNOSIS — R7303 Prediabetes: Secondary | ICD-10-CM | POA: Diagnosis not present

## 2023-02-07 MED ORDER — METFORMIN HCL 500 MG PO TABS
ORAL_TABLET | ORAL | 0 refills | Status: DC
Start: 2023-02-07 — End: 2023-06-07

## 2023-02-07 NOTE — Progress Notes (Signed)
Office: [phone]  /  Fax: [phone]  WEIGHT SUMMARY AND BIOMETRICS  Weight Lost Since Last Visit: 0lb  Weight Gained Since Last Visit: 3lb   Vitals Temp: 98.2 F (36.8 C) BP: 133/82 Pulse Rate: 83 SpO2: 96 %   Anthropometric Measurements Weight: 226 lb (102.5 kg) BMI (Calculated): 35.39 Weight at Last Visit: 223lb Weight Lost Since Last Visit: 0lb Weight Gained Since Last Visit: 3lb Starting Weight: 242lb Total Weight Loss (lbs): 16 lb (7.258 kg)   Body Composition  Body Fat %: 40.7 % Fat Mass (lbs): 92.4 lbs Muscle Mass (lbs): 127.6 lbs Total Body Water (lbs): 83 lbs Visceral Fat Rating : 11   Other Clinical Data Fasting: Yes Labs: No Today's Visit #: 65 Starting Date: 07/02/19     HPI  Chief Complaint: OBESITY  Shelley Ryan is here to discuss her progress with her obesity treatment plan. She is on the the Category 2 Plan and states she is following her eating plan approximately 50 % of the time. She states she is exercising 0 minutes 0 days per week.   Interval History:  Since last office visit she has gained 3 pounds.  She is drinking water, 1 soda (has reduced) and coffee daily.  She has been more active and has been walking more. She went to exercising counseling on 02/01/23.  Plans to join a gym and start working out with a Systems analyst.     Pharmacotherapy for weight loss: She is not currently taking medications  for medical weight loss.   Previous pharmacotherapy for medical weight loss:  Wegovy, Phentermine, Contrave and Saxenda. Unable to continue GLP-1s due to cost/coverage. She stopped Phentermine due to not being effective.   Bariatric surgery:  Patient saw Dr. Barnetta Chapel last on 01/03/23 for bariatric surgery.  She has been cleared by nutrition and has appt with behavorial health on 02/15/23.   She is going on a cruise in January and is planning to have surgery in February.   Hypertension Hypertension stable.  Medication(s): Lisinopril  5mg .  Denies side effects.   Denies chest pain, palpitations and SOB.  BP Readings from Last 3 Encounters:  02/07/23 133/82  01/10/23 137/82  12/14/22 139/81   Lab Results  Component Value Date   CREATININE 0.93 10/07/2022   CREATININE 0.92 05/24/2022   CREATININE 0.85 02/18/2022    Prediabetes Last A1c was 5.9  Medication(s): Metformin 500mg  BID.  Denies side effects.  Polyphagia:Yes Lab Results  Component Value Date   HGBA1C 5.9 (H) 10/07/2022   HGBA1C 5.9 (H) 05/24/2022   HGBA1C 6.0 (H) 02/18/2022   HGBA1C 6.0 (H) 11/02/2021   HGBA1C 5.7 (H) 06/25/2021   Lab Results  Component Value Date   INSULIN 18.0 10/07/2022   INSULIN 13.4 05/24/2022   INSULIN 15.8 02/18/2022   INSULIN 9.4 11/02/2021   INSULIN 18.2 06/25/2021    PHYSICAL EXAM:  Blood pressure 133/82, pulse 83, temperature 98.2 F (36.8 C), weight 226 lb (102.5 kg), SpO2 96%. Body mass index is 35.4 kg/m.  General: She is overweight, cooperative, alert, well developed, and in no acute distress. PSYCH: Has normal mood, affect and thought process.   Extremities: No edema.  Neurologic: No gross sensory or motor deficits. No tremors or fasciculations noted.    DIAGNOSTIC DATA REVIEWED:  BMET    Component Value Date/Time   NA 141 10/07/2022 0805   K 4.7 10/07/2022 0805   CL 108 (H) 10/07/2022 0805   CO2 18 (L) 10/07/2022 0805   GLUCOSE  108 (H) 10/07/2022 0805   GLUCOSE 96 08/29/2019 0549   BUN 16 10/07/2022 0805   CREATININE 0.93 10/07/2022 0805   CALCIUM 9.3 10/07/2022 0805   GFRNONAA 84 05/27/2020 0938   GFRAA 96 05/27/2020 0938   Lab Results  Component Value Date   HGBA1C 5.9 (H) 10/07/2022   HGBA1C 6.1 (H) 06/19/2019   Lab Results  Component Value Date   INSULIN 18.0 10/07/2022   INSULIN 13.7 07/02/2019   Lab Results  Component Value Date   TSH 3.060 07/02/2019   CBC    Component Value Date/Time   WBC 9.1 10/07/2022 0805   WBC 12.1 (H) 08/29/2019 0549   RBC 4.69 10/07/2022 0805    RBC 4.54 08/29/2019 0549   HGB 14.3 10/07/2022 0805   HCT 42.0 10/07/2022 0805   PLT 247 10/07/2022 0805   MCV 90 10/07/2022 0805   MCH 30.5 10/07/2022 0805   MCH 30.0 08/29/2019 0549   MCHC 34.0 10/07/2022 0805   MCHC 32.9 08/29/2019 0549   RDW 12.6 10/07/2022 0805   Iron Studies No results found for: "IRON", "TIBC", "FERRITIN", "IRONPCTSAT" Lipid Panel     Component Value Date/Time   CHOL 201 (H) 10/07/2022 0805   TRIG 103 10/07/2022 0805   HDL 51 10/07/2022 0805   CHOLHDL 4.0 02/18/2022 1132   LDLCALC 131 (H) 10/07/2022 0805   Hepatic Function Panel     Component Value Date/Time   PROT 7.0 10/07/2022 0805   ALBUMIN 4.4 10/07/2022 0805   AST 17 10/07/2022 0805   ALT 16 10/07/2022 0805   ALKPHOS 71 10/07/2022 0805   BILITOT 0.3 10/07/2022 0805      Component Value Date/Time   TSH 3.060 07/02/2019 1158   Nutritional Lab Results  Component Value Date   VD25OH 49.1 10/07/2022   VD25OH 46.3 02/18/2022   VD25OH 48.0 11/02/2021     ASSESSMENT AND PLAN  TREATMENT PLAN FOR OBESITY:  Recommended Dietary Goals  Carnisha is currently in the action stage of change. As such, her goal is to continue weight management plan. She has agreed to the Category 2 Plan.  Behavioral Intervention  We discussed the following Behavioral Modification Strategies today: increasing lean protein intake to established goals, increasing water intake , work on meal planning and preparation, reading food labels , keeping healthy foods at home, and continue to work on maintaining a reduced calorie state, getting the recommended amount of protein, incorporating whole foods, making healthy choices, staying well hydrated and practicing mindfulness when eating..  Additional resources provided today: NA  Recommended Physical Activity Goals  Trinty has been advised to work up to 150 minutes of moderate intensity aerobic activity a week and strengthening exercises 2-3 times per week for  cardiovascular health, weight loss maintenance and preservation of muscle mass.   She has agreed to Think about enjoyable ways to increase daily physical activity and overcoming barriers to exercise and Increase physical activity in their day and reduce sedentary time (increase NEAT).     ASSOCIATED CONDITIONS ADDRESSED TODAY  Action/Plan  Essential hypertension Continue to follow up with PCP and neurology.  Continue meds as directed  Prediabetes -     metFORMIN HCl; TAKE 1 TABLET BY MOUTH TWICE DAILY  Dispense: 180 tablet; Refill: 0  [place] will continue to work on weight loss, exercise, and decreasing simple carbohydrates to help decrease the risk of diabetes.    Generalized obesity  BMI 35.0-35.9,adult         Return in about  4 weeks (around 03/07/2023).Marland Kitchen She was informed of the importance of frequent follow up visits to maximize her success with intensive lifestyle modifications for her multiple health conditions.   ATTESTASTION STATEMENTS:  Reviewed by clinician on day of visit: allergies, medications, problem list, medical history, surgical history, family history, social history, and previous encounter notes.     Theodis Sato. Jaquitta Dupriest FNP-C

## 2023-02-07 NOTE — Patient Instructions (Signed)

## 2023-02-22 ENCOUNTER — Ambulatory Visit: Payer: BC Managed Care – PPO

## 2023-03-04 ENCOUNTER — Other Ambulatory Visit: Payer: Self-pay | Admitting: Medical Genetics

## 2023-03-04 DIAGNOSIS — Z006 Encounter for examination for normal comparison and control in clinical research program: Secondary | ICD-10-CM

## 2023-03-10 ENCOUNTER — Ambulatory Visit: Payer: BC Managed Care – PPO | Admitting: Nurse Practitioner

## 2023-03-10 ENCOUNTER — Encounter: Payer: Self-pay | Admitting: Nurse Practitioner

## 2023-03-10 VITALS — BP 108/75 | HR 77 | Temp 98.0°F | Ht 67.0 in | Wt 228.0 lb

## 2023-03-10 DIAGNOSIS — K219 Gastro-esophageal reflux disease without esophagitis: Secondary | ICD-10-CM

## 2023-03-10 DIAGNOSIS — Z6835 Body mass index (BMI) 35.0-35.9, adult: Secondary | ICD-10-CM | POA: Diagnosis not present

## 2023-03-10 DIAGNOSIS — E669 Obesity, unspecified: Secondary | ICD-10-CM | POA: Diagnosis not present

## 2023-03-10 NOTE — Progress Notes (Signed)
Office: [phone]  /  Fax: [phone]  WEIGHT SUMMARY AND BIOMETRICS  Weight Lost Since Last Visit: 0lb  Weight Gained Since Last Visit: 2lb   Vitals Temp: 98 F (36.7 C) BP: 108/75 Pulse Rate: 77 SpO2: 95 %   Anthropometric Measurements Height: 5\' 7"  (1.702 m) Weight: 228 lb (103.4 kg) BMI (Calculated): 35.7 Weight at Last Visit: 226lb Weight Lost Since Last Visit: 0lb Weight Gained Since Last Visit: 2lb Starting Weight: 242lb Total Weight Loss (lbs): 14 lb (6.35 kg)   Body Composition  Body Fat %: 40.5 % Fat Mass (lbs): 92.4 lbs Muscle Mass (lbs): 129 lbs Total Body Water (lbs): 83.6 lbs Visceral Fat Rating : 11   Other Clinical Data Fasting: Yes Labs: No Today's Visit #: 35 Starting Date: 07/02/19     HPI  Chief Complaint: OBESITY  Shelley Ryan is here to discuss her progress with her obesity treatment plan. She is on the the Category 2 Plan and states she is following her eating plan approximately 80 % of the time. She states she is exercising 50 minutes 3-4 days per week.   Interval History:  Since last office visit she has gained 2 pounds.  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    Pharmacotherapy for weight loss: She is not currently taking medications  for medical weight loss.    Previous pharmacotherapy for medical weight loss:   Wegovy, Phentermine, Contrave and Saxenda.  Unable to continue GLP-1s due to cost/coverage.  She stopped Phentermine due to not being effective.    Bariatric surgery:  Patient saw Dr. Barnetta Chapel last on 01/03/23 for bariatric surgery.  She has been cleared by nutrition and behavorial health.  She had some concerns after her upper GI on 02/17/23.  Struggling with GERD.  Has appt with GI in March.    She is going on a cruise in January and is planning to have surgery in February.    PHYSICAL EXAM:  Blood pressure 108/75, pulse 77, temperature 98 F (36.7  C), height 5\' 7"  (1.702 m), weight 228 lb (103.4 kg), SpO2 95%. Body mass index is 35.71 kg/m.  General: She is overweight, cooperative, alert, well developed, and in no acute distress. PSYCH: Has normal mood, affect and thought process.   Extremities: No edema.  Neurologic: No gross sensory or motor deficits. No tremors or fasciculations noted.    DIAGNOSTIC DATA REVIEWED:  BMET    Component Value Date/Time   NA 141 10/07/2022 0805   K 4.7 10/07/2022 0805   CL 108 (H) 10/07/2022 0805   CO2 18 (L) 10/07/2022 0805   GLUCOSE 108 (H) 10/07/2022 0805   GLUCOSE 96 08/29/2019 0549   BUN 16 10/07/2022 0805   CREATININE 0.93 10/07/2022 0805   CALCIUM 9.3 10/07/2022 0805   GFRNONAA 84 05/27/2020 0938   GFRAA 96 05/27/2020 0938   Lab Results  Component Value Date   HGBA1C 5.9 (H) 10/07/2022   HGBA1C 6.1 (H) 06/19/2019   Lab Results  Component Value Date   INSULIN 18.0 10/07/2022   INSULIN 13.7 07/02/2019   Lab Results  Component Value Date   TSH 3.060 07/02/2019   CBC    Component Value Date/Time   WBC 9.1 10/07/2022 0805   WBC 12.1 (H) 08/29/2019 0549   RBC 4.69 10/07/2022 0805   RBC 4.54 08/29/2019 0549   HGB 14.3 10/07/2022 0805   HCT 42.0 10/07/2022 0805  PLT 247 10/07/2022 0805   MCV 90 10/07/2022 0805   MCH 30.5 10/07/2022 0805   MCH 30.0 08/29/2019 0549   MCHC 34.0 10/07/2022 0805   MCHC 32.9 08/29/2019 0549   RDW 12.6 10/07/2022 0805   Iron Studies No results found for: "IRON", "TIBC", "FERRITIN", "IRONPCTSAT" Lipid Panel     Component Value Date/Time   CHOL 201 (H) 10/07/2022 0805   TRIG 103 10/07/2022 0805   HDL 51 10/07/2022 0805   CHOLHDL 4.0 02/18/2022 1132   LDLCALC 131 (H) 10/07/2022 0805   Hepatic Function Panel     Component Value Date/Time   PROT 7.0 10/07/2022 0805   ALBUMIN 4.4 10/07/2022 0805   AST 17 10/07/2022 0805   ALT 16 10/07/2022 0805   ALKPHOS 71 10/07/2022 0805   BILITOT 0.3 10/07/2022 0805      Component Value  Date/Time   TSH 3.060 07/02/2019 1158   Nutritional Lab Results  Component Value Date   VD25OH 49.1 10/07/2022   VD25OH 46.3 02/18/2022   VD25OH 48.0 11/02/2021     ASSESSMENT AND PLAN  TREATMENT PLAN FOR OBESITY:  Recommended Dietary Goals  Shelley Ryan is currently in the action stage of change. As such, her goal is to continue weight management plan. She has agreed to the Category 2 Plan.  Behavioral Intervention  We discussed the following Behavioral Modification Strategies today: increasing lean protein intake to established goals, increasing water intake , reading food labels , keeping healthy foods at home, and continue to work on maintaining a reduced calorie state, getting the recommended amount of protein, incorporating whole foods, making healthy choices, staying well hydrated and practicing mindfulness when eating..  Additional resources provided today: NA  Recommended Physical Activity Goals  Shelley Ryan has been advised to work up to 150 minutes of moderate intensity aerobic activity a week and strengthening exercises 2-3 times per week for cardiovascular health, weight loss maintenance and preservation of muscle mass.   She has agreed to Continue current level of physical activity , Increase the intensity, frequency or duration of strengthening exercises , and Increase the intensity, frequency or duration of aerobic exercises     ASSOCIATED CONDITIONS ADDRESSED TODAY  Action/Plan  Gastroesophageal reflux disease, unspecified whether esophagitis present Keep appt with GI.  To call and see if she can get a sooner appt.   To call and sched appt with surgeon.   Generalized obesity  BMI 35.0-35.9,adult         Return in about 4 weeks (around 04/07/2023).Marland Kitchen She was informed of the importance of frequent follow up visits to maximize her success with intensive lifestyle modifications for her multiple health conditions.   ATTESTASTION STATEMENTS:  Reviewed by clinician  on day of visit: allergies, medications, problem list, medical history, surgical history, family history, social history, and previous encounter notes.   Time spent on visit including pre-visit chart review and post-visit care and charting was 30 minutes.    Theodis Sato. Sukhraj Esquivias FNP-C

## 2023-03-21 ENCOUNTER — Ambulatory Visit
Admission: RE | Admit: 2023-03-21 | Discharge: 2023-03-21 | Disposition: A | Discharge status: Home / Self Care | Payer: BC Managed Care – PPO | Source: Ambulatory Visit | Attending: Family Medicine | Admitting: Family Medicine

## 2023-03-21 DIAGNOSIS — Z1231 Encounter for screening mammogram for malignant neoplasm of breast: Secondary | ICD-10-CM

## 2023-04-11 ENCOUNTER — Other Ambulatory Visit (HOSPITAL_COMMUNITY)
Admission: RE | Admit: 2023-04-11 | Discharge: 2023-04-11 | Disposition: A | Discharge status: Home / Self Care | Payer: Self-pay | Source: Ambulatory Visit | Attending: Medical Genetics | Admitting: Medical Genetics

## 2023-04-11 DIAGNOSIS — Z006 Encounter for examination for normal comparison and control in clinical research program: Secondary | ICD-10-CM

## 2023-04-13 ENCOUNTER — Ambulatory Visit: Payer: BC Managed Care – PPO | Admitting: Nurse Practitioner

## 2023-04-13 ENCOUNTER — Encounter: Payer: Self-pay | Admitting: Nurse Practitioner

## 2023-04-13 VITALS — BP 136/80 | HR 86 | Temp 98.0°F | Ht 67.0 in | Wt 229.0 lb

## 2023-04-13 DIAGNOSIS — K219 Gastro-esophageal reflux disease without esophagitis: Secondary | ICD-10-CM

## 2023-04-13 DIAGNOSIS — Z6835 Body mass index (BMI) 35.0-35.9, adult: Secondary | ICD-10-CM

## 2023-04-13 DIAGNOSIS — E669 Obesity, unspecified: Secondary | ICD-10-CM

## 2023-04-13 NOTE — Progress Notes (Signed)
Office: [phone]  /  Fax: [phone]  WEIGHT SUMMARY AND BIOMETRICS  Weight Lost Since Last Visit: 0lb  Weight Gained Since Last Visit: 1lb   Vitals Temp: 98 F (36.7 C) BP: 136/80 Pulse Rate: 86 SpO2: 98 %   Anthropometric Measurements Height: 5\' 7"  (1.702 m) Weight: 229 lb (103.9 kg) BMI (Calculated): 35.86 Weight at Last Visit: 228lb Weight Lost Since Last Visit: 0lb Weight Gained Since Last Visit: 1lb Starting Weight: 242lb Total Weight Loss (lbs): 13 lb (5.897 kg)   Body Composition  Body Fat %: 40.6 % Fat Mass (lbs): 93 lbs Muscle Mass (lbs): 129.2 lbs Total Body Water (lbs): 82.2 lbs Visceral Fat Rating : 11   Other Clinical Data Fasting: No Labs: No Today's Visit #: 40 Starting Date: 07/02/19     HPI  Chief Complaint: OBESITY  Shelley Ryan is here to discuss her progress with her obesity treatment plan. She is on the the Category 2 Plan and states she is following her eating plan approximately 90 % of the time. She states she is exercising 45-60 minutes 3 days per week.   Interval History:  Since last office visit she has gained 1 pound.  She is not skipping meals (eating small meals) and is eating a protein with each meal.  Still struggles with GERD and nausea after eating.  Has appt with GI in March. She is drinking water with flavoring and a soda daily.  She is doing cardio and resistance training 3 days per week.  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    She is going on a cruise in January.    Pharmacotherapy for weight loss: She is not currently taking medications  for medical weight loss.    Previous pharmacotherapy for medical weight loss:   Wegovy, Phentermine, Contrave and Saxenda.  Unable to continue GLP-1s due to cost/coverage.  She stopped Phentermine due to not being effective.    Bariatric surgery:  Patient saw Dr. Barnetta Chapel last on 01/03/23 for bariatric surgery.  She has been cleared by nutrition and behavorial  health. She had exercise counseling on 02/01/23.   She had some concerns after her upper GI on 02/17/23.  Struggling with GERD and nausea.  Has appt with GI in March.     PHYSICAL EXAM:  Blood pressure 136/80, pulse 86, temperature 98 F (36.7 C), height 5\' 7"  (1.702 m), weight 229 lb (103.9 kg), SpO2 98%. Body mass index is 35.87 kg/m.  General: She is overweight, cooperative, alert, well developed, and in no acute distress. PSYCH: Has normal mood, affect and thought process.   Extremities: No edema.  Neurologic: No gross sensory or motor deficits. No tremors or fasciculations noted.    DIAGNOSTIC DATA REVIEWED:  BMET    Component Value Date/Time   NA 141 10/07/2022 0805   K 4.7 10/07/2022 0805   CL 108 (H) 10/07/2022 0805   CO2 18 (L) 10/07/2022 0805   GLUCOSE 108 (H) 10/07/2022 0805   GLUCOSE 96 08/29/2019 0549   BUN 16 10/07/2022 0805   CREATININE 0.93 10/07/2022 0805   CALCIUM 9.3 10/07/2022 0805   GFRNONAA 84 05/27/2020 0938   GFRAA 96 05/27/2020 0938   Lab Results  Component Value Date   HGBA1C 5.9 (H) 10/07/2022   HGBA1C 6.1 (H) 06/19/2019   Lab Results  Component Value Date   INSULIN 18.0 10/07/2022   INSULIN 13.7 07/02/2019   Lab Results  Component Value Date   TSH 3.060 07/02/2019  CBC    Component Value Date/Time   WBC 9.1 10/07/2022 0805   WBC 12.1 (H) 08/29/2019 0549   RBC 4.69 10/07/2022 0805   RBC 4.54 08/29/2019 0549   HGB 14.3 10/07/2022 0805   HCT 42.0 10/07/2022 0805   PLT 247 10/07/2022 0805   MCV 90 10/07/2022 0805   MCH 30.5 10/07/2022 0805   MCH 30.0 08/29/2019 0549   MCHC 34.0 10/07/2022 0805   MCHC 32.9 08/29/2019 0549   RDW 12.6 10/07/2022 0805   Iron Studies No results found for: "IRON", "TIBC", "FERRITIN", "IRONPCTSAT" Lipid Panel     Component Value Date/Time   CHOL 201 (H) 10/07/2022 0805   TRIG 103 10/07/2022 0805   HDL 51 10/07/2022 0805   CHOLHDL 4.0 02/18/2022 1132   LDLCALC 131 (H) 10/07/2022 0805   Hepatic  Function Panel     Component Value Date/Time   PROT 7.0 10/07/2022 0805   ALBUMIN 4.4 10/07/2022 0805   AST 17 10/07/2022 0805   ALT 16 10/07/2022 0805   ALKPHOS 71 10/07/2022 0805   BILITOT 0.3 10/07/2022 0805      Component Value Date/Time   TSH 3.060 07/02/2019 1158   Nutritional Lab Results  Component Value Date   VD25OH 49.1 10/07/2022   VD25OH 46.3 02/18/2022   VD25OH 48.0 11/02/2021     ASSESSMENT AND PLAN  TREATMENT PLAN FOR OBESITY:  Recommended Dietary Goals  Shelley Ryan is currently in the action stage of change. As such, her goal is to continue weight management plan. She has agreed to the Category 2 Plan.  Behavioral Intervention  We discussed the following Behavioral Modification Strategies today: celebration eating strategies and continue to work on maintaining a reduced calorie state, getting the recommended amount of protein, incorporating whole foods, making healthy choices, staying well hydrated and practicing mindfulness when eating..  Additional resources provided today: NA  Recommended Physical Activity Goals  Shelley Ryan has been advised to work up to 150 minutes of moderate intensity aerobic activity a week and strengthening exercises 2-3 times per week for cardiovascular health, weight loss maintenance and preservation of muscle mass.   She has agreed to Continue current level of physical activity     ASSOCIATED CONDITIONS ADDRESSED TODAY  Action/Plan  Gastroesophageal reflux disease, unspecified whether esophagitis present Keep appt with GI We called GI to see if she could be seen sooner and put on cancellation list .  Generalized obesity  BMI 35.0-35.9,adult    Unable to take GLP-1s due to cost.  However due to her GERD and nausea, I don't think a GLP-1 is a good option for her.     Return in about 6 weeks (around 05/25/2023).Marland Kitchen She was informed of the importance of frequent follow up visits to maximize her success with intensive lifestyle  modifications for her multiple health conditions.   ATTESTASTION STATEMENTS:  Reviewed by clinician on day of visit: allergies, medications, problem list, medical history, surgical history, family history, social history, and previous encounter notes.   Time spent on visit including pre-visit chart review and post-visit care and charting was 30 minutes.    Theodis Sato. Nai Dasch FNP-C

## 2023-04-22 ENCOUNTER — Encounter: Payer: Self-pay | Admitting: Nurse Practitioner

## 2023-04-25 LAB — GENECONNECT MOLECULAR SCREEN: Genetic Analysis Overall Interpretation: NEGATIVE

## 2023-05-19 ENCOUNTER — Encounter: Payer: Self-pay | Admitting: Nurse Practitioner

## 2023-05-19 ENCOUNTER — Ambulatory Visit: Payer: 59 | Admitting: Nurse Practitioner

## 2023-05-19 VITALS — BP 121/77 | HR 83 | Temp 98.0°F | Ht 67.0 in | Wt 229.0 lb

## 2023-05-19 DIAGNOSIS — R7303 Prediabetes: Secondary | ICD-10-CM

## 2023-05-19 DIAGNOSIS — K219 Gastro-esophageal reflux disease without esophagitis: Secondary | ICD-10-CM | POA: Diagnosis not present

## 2023-05-19 DIAGNOSIS — E669 Obesity, unspecified: Secondary | ICD-10-CM | POA: Diagnosis not present

## 2023-05-19 DIAGNOSIS — Z6835 Body mass index (BMI) 35.0-35.9, adult: Secondary | ICD-10-CM

## 2023-05-19 NOTE — Progress Notes (Signed)
Office: [phone]  /  Fax: [phone]  WEIGHT SUMMARY AND BIOMETRICS  Weight Lost Since Last Visit: 0lb  Weight Gained Since Last Visit: 0lb   Vitals Temp: 98 F (36.7 C) BP: 121/77 Pulse Rate: 83 SpO2: 98 %   Anthropometric Measurements Height: 5\' 7"  (1.702 m) Weight: 229 lb (103.9 kg) BMI (Calculated): 35.86 Weight at Last Visit: 229lb Weight Lost Since Last Visit: 0lb Weight Gained Since Last Visit: 0lb Starting Weight: 242lb Total Weight Loss (lbs): 13 lb (5.897 kg)   Body Composition  Body Fat %: 42.1 % Fat Mass (lbs): 96.6 lbs Muscle Mass (lbs): 126.2 lbs Total Body Water (lbs): 83.6 lbs Visceral Fat Rating : 11   Other Clinical Data Fasting: Yes Labs: No Today's Visit #: 33 Starting Date: 07/02/19     HPI  Chief Complaint: OBESITY  Windee is here to discuss her progress with her obesity treatment plan. She is on the the Category 2 Plan and states she is following her eating plan approximately 90 % of the time. She states she is exercising 60 minutes 3 days per week-cardio and resistance training.   Interval History:  Since last office visit she has maintained her weight.  She is aiming to eat more protein. Goal is 30grams of protein for breakfast.  She is drinking coffee with collage powder.  She snacks on yogurt, protein bars, nuts, fruit.  For lunch, she eats a frozen dinner or if busy at work she will drink a protein shake with a protein bar or leftovers.  For dinner, protein and vegetable. She is drinking water daily.  Drinks occ diet soda.    She has struggled with her weight since she was 48 yo after being diagnosed with PCOS.  She has tried numerous weight loss plans including Weight Watcher, HCG, low carb, Atkins, calorie counting-tracking, etc.  She has been coming here for follow ups since 07/02/19.  Her overall goal is to become healthier and improve her co morbidities.    She is leaving for a cruise this week.    Pharmacotherapy for  weight loss: She is not currently taking medications  for medical weight loss.    Previous pharmacotherapy for medical weight loss:   Wegovy, Phentermine, Contrave and Saxenda-didn't have worsening of GERD with taking GLP-1s Unable to continue GLP-1s due to cost/coverage.  She stopped Phentermine due to not being effective.    Bariatric surgery:   Currently bariatric surgery is on hold until after EGD.   Patient saw Dr. Barnetta Chapel last on 01/03/23 for bariatric surgery.  She has been cleared by nutrition and behavorial health. She had exercise counseling on 02/01/23.      GERD She saw GI since her last visit and is scheduled for EGD Feb 10th. She was started on Protonix 40mg .  Still struggling with GERD.     Prediabetes Last A1c was 5.9  Medication(s): Metformin 500mg  BID.  Denies side effects  Polyphagia:Yes Lab Results  Component Value Date   HGBA1C 5.9 (H) 10/07/2022   HGBA1C 5.9 (H) 05/24/2022   HGBA1C 6.0 (H) 02/18/2022   HGBA1C 6.0 (H) 11/02/2021   HGBA1C 5.7 (H) 06/25/2021   Lab Results  Component Value Date   INSULIN 18.0 10/07/2022   INSULIN 13.4 05/24/2022   INSULIN 15.8 02/18/2022   INSULIN 9.4 11/02/2021   INSULIN 18.2 06/25/2021    PHYSICAL EXAM:  Blood pressure 121/77, pulse 83, temperature 98 F (36.7 C), height 5\' 7"  (1.702 m), weight 229 lb (103.9 kg),  SpO2 98%. Body mass index is 35.87 kg/m.  General: She is overweight, cooperative, alert, well developed, and in no acute distress. PSYCH: Has normal mood, affect and thought process.   Extremities: No edema.  Neurologic: No gross sensory or motor deficits. No tremors or fasciculations noted.    DIAGNOSTIC DATA REVIEWED:  BMET    Component Value Date/Time   NA 141 10/07/2022 0805   K 4.7 10/07/2022 0805   CL 108 (H) 10/07/2022 0805   CO2 18 (L) 10/07/2022 0805   GLUCOSE 108 (H) 10/07/2022 0805   GLUCOSE 96 08/29/2019 0549   BUN 16 10/07/2022 0805   CREATININE 0.93 10/07/2022 0805   CALCIUM 9.3  10/07/2022 0805   GFRNONAA 84 05/27/2020 0938   GFRAA 96 05/27/2020 0938   Lab Results  Component Value Date   HGBA1C 5.9 (H) 10/07/2022   HGBA1C 6.1 (H) 06/19/2019   Lab Results  Component Value Date   INSULIN 18.0 10/07/2022   INSULIN 13.7 07/02/2019   Lab Results  Component Value Date   TSH 3.060 07/02/2019   CBC    Component Value Date/Time   WBC 9.1 10/07/2022 0805   WBC 12.1 (H) 08/29/2019 0549   RBC 4.69 10/07/2022 0805   RBC 4.54 08/29/2019 0549   HGB 14.3 10/07/2022 0805   HCT 42.0 10/07/2022 0805   PLT 247 10/07/2022 0805   MCV 90 10/07/2022 0805   MCH 30.5 10/07/2022 0805   MCH 30.0 08/29/2019 0549   MCHC 34.0 10/07/2022 0805   MCHC 32.9 08/29/2019 0549   RDW 12.6 10/07/2022 0805   Iron Studies No results found for: "IRON", "TIBC", "FERRITIN", "IRONPCTSAT" Lipid Panel     Component Value Date/Time   CHOL 201 (H) 10/07/2022 0805   TRIG 103 10/07/2022 0805   HDL 51 10/07/2022 0805   CHOLHDL 4.0 02/18/2022 1132   LDLCALC 131 (H) 10/07/2022 0805   Hepatic Function Panel     Component Value Date/Time   PROT 7.0 10/07/2022 0805   ALBUMIN 4.4 10/07/2022 0805   AST 17 10/07/2022 0805   ALT 16 10/07/2022 0805   ALKPHOS 71 10/07/2022 0805   BILITOT 0.3 10/07/2022 0805      Component Value Date/Time   TSH 3.060 07/02/2019 1158   Nutritional Lab Results  Component Value Date   VD25OH 49.1 10/07/2022   VD25OH 46.3 02/18/2022   VD25OH 48.0 11/02/2021     ASSESSMENT AND PLAN  TREATMENT PLAN FOR OBESITY:  Recommended Dietary Goals  Jaklynn is currently in the action stage of change. As such, her goal is to continue weight management plan. She has agreed to the Category 2 Plan.  Behavioral Intervention  We discussed the following Behavioral Modification Strategies today: increasing lean protein intake to established goals, decreasing simple carbohydrates , increasing vegetables, increasing fiber rich foods, increasing water intake , work on meal  planning and preparation, reading food labels , keeping healthy foods at home, continue to practice mindfulness when eating, planning for success, and continue to work on maintaining a reduced calorie state, getting the recommended amount of protein, incorporating whole foods, making healthy choices, staying well hydrated and practicing mindfulness when eating..  Additional resources provided today: NA  Recommended Physical Activity Goals  Henlee has been advised to work up to 150 minutes of moderate intensity aerobic activity a week and strengthening exercises 2-3 times per week for cardiovascular health, weight loss maintenance and preservation of muscle mass.   She has agreed to Think about enjoyable ways to  increase daily physical activity and overcoming barriers to exercise, Increase physical activity in their day and reduce sedentary time (increase NEAT)., Increase the intensity, frequency or duration of strengthening exercises , and Increase the intensity, frequency or duration of aerobic exercises     Pharmacotherapy We discussed various medication options to help Harriett Sine with her weight loss efforts and we both agreed to consider her options.  Will consider GLP-1 based upon GI recommendations.  Would need to monitor for worsening of GERD symptoms.    ASSOCIATED CONDITIONS ADDRESSED TODAY  Action/Plan  Gastroesophageal reflux disease, unspecified whether esophagitis present Keep appt with GI and EGD  Prediabetes Dianely will continue to work on weight loss, exercise, and decreasing simple carbohydrates to help decrease the risk of diabetes.    Generalized obesity Will consider GLP-1 based upon GI recommendations.   BMI 35.0-35.9,adult         Return in about 3 weeks (around 06/09/2023).Marland Kitchen She was informed of the importance of frequent follow up visits to maximize her success with intensive lifestyle modifications for her multiple health conditions.   ATTESTASTION  STATEMENTS:  Reviewed by clinician on day of visit: allergies, medications, problem list, medical history, surgical history, family history, social history, and previous encounter notes.   Time spent on visit including pre-visit chart review and post-visit care and charting was 30 minutes.    Theodis Sato. Denzil Mceachron FNP-C

## 2023-06-07 ENCOUNTER — Ambulatory Visit: Payer: 59 | Admitting: Nurse Practitioner

## 2023-06-07 VITALS — BP 135/89 | HR 83 | Temp 98.2°F | Ht 67.0 in | Wt 228.0 lb

## 2023-06-07 DIAGNOSIS — R7303 Prediabetes: Secondary | ICD-10-CM | POA: Diagnosis not present

## 2023-06-07 DIAGNOSIS — I10 Essential (primary) hypertension: Secondary | ICD-10-CM | POA: Diagnosis not present

## 2023-06-07 DIAGNOSIS — K219 Gastro-esophageal reflux disease without esophagitis: Secondary | ICD-10-CM | POA: Diagnosis not present

## 2023-06-07 DIAGNOSIS — Z6835 Body mass index (BMI) 35.0-35.9, adult: Secondary | ICD-10-CM

## 2023-06-07 DIAGNOSIS — E559 Vitamin D deficiency, unspecified: Secondary | ICD-10-CM

## 2023-06-07 DIAGNOSIS — E669 Obesity, unspecified: Secondary | ICD-10-CM

## 2023-06-07 MED ORDER — LISINOPRIL 5 MG PO TABS
5.0000 mg | ORAL_TABLET | Freq: Every day | ORAL | 0 refills | Status: DC
Start: 2023-06-07 — End: 2023-09-07

## 2023-06-07 MED ORDER — METFORMIN HCL 500 MG PO TABS: ORAL_TABLET | ORAL | 0 refills | Status: DC

## 2023-06-07 MED ORDER — VITAMIN D (ERGOCALCIFEROL) 1.25 MG (50000 UNIT) PO CAPS: 50000.0000 [IU] | ORAL_CAPSULE | ORAL | 0 refills | Status: DC

## 2023-06-07 NOTE — Progress Notes (Signed)
Office: [phone]  /  Fax: [phone]  WEIGHT SUMMARY AND BIOMETRICS  Weight Lost Since Last Visit: 1lb  Weight Gained Since Last Visit: 0lb   Vitals Temp: 98.2 F (36.8 C) BP: 135/89 Pulse Rate: 83 SpO2: 97 %   Anthropometric Measurements Height: 5\' 7"  (1.702 m) Weight: 228 lb (103.4 kg) BMI (Calculated): 35.7 Weight at Last Visit: 229lb Weight Lost Since Last Visit: 1lb Weight Gained Since Last Visit: 0lb Starting Weight: 242lb Total Weight Loss (lbs): 14 lb (6.35 kg)   Body Composition  Body Fat %: 40.3 % Fat Mass (lbs): 92 lbs Muscle Mass (lbs): 129.4 lbs Total Body Water (lbs): 82.8 lbs Visceral Fat Rating : 11   Other Clinical Data Fasting: No Labs: No Today's Visit #: 31 Starting Date: 07/02/19     HPI  Chief Complaint: OBESITY  Letisha is here to discuss her progress with her obesity treatment plan. She is on the the Category 2 Plan and states she is following her eating plan approximately 80 % of the time. She states she is exercising 60 minutes 3-4 days per week.   Interval History:  Since last office visit she has lost 1 pound.  She is averaging around 1300 calories, <125 carbs, 60 grams fat and around 100-110 grams of protein. She finds eating more protein in the am has helped with her hunger during the day.  She went on a cruise since her last visit.  She reports that she was very active on the cruise. She is drinking coffee with collagen and water daily.  Denies sugary drinks.     No up coming celebrations Traveling this weekend to [place]    Pharmacotherapy for weight loss: She is not currently taking medications  for medical weight loss.    Previous pharmacotherapy for medical weight loss:   Wegovy, Phentermine, Contrave and Saxenda-didn't have worsening of GERD with taking GLP-1s Unable to continue GLP-1s due to cost/coverage.  She stopped Phentermine due to not being effective.    Bariatric surgery:   Currently bariatric  surgery is on hold until after EGD.  Plans to reach out to Dr. Barnetta Chapel since having EGD yesterday.   Patient saw Dr. Barnetta Chapel last on 01/03/23 for bariatric surgery.  She has been cleared by nutrition and behavorial health. She had exercise counseling on 02/01/23.      GERD She saw GI since her last visit and had an EGD Feb 10th. She is taking Protonix 40mg  daily.     Hypertension Hypertension stable.  Medication(s): Lisinopril 5mg .  Denies side effects.  Denies chest pain, palpitations and SOB.  BP Readings from Last 3 Encounters:  06/07/23 135/89  05/19/23 121/77  04/13/23 136/80   Lab Results  Component Value Date   CREATININE 0.93 10/07/2022   CREATININE 0.92 05/24/2022   CREATININE 0.85 02/18/2022     Prediabetes Last A1c was 5.9  Medication(s): Metformin 500mg  BID. Denies side effects  Polyphagia:Yes Lab Results  Component Value Date   HGBA1C 5.9 (H) 10/07/2022   HGBA1C 5.9 (H) 05/24/2022   HGBA1C 6.0 (H) 02/18/2022   HGBA1C 6.0 (H) 11/02/2021   HGBA1C 5.7 (H) 06/25/2021   Lab Results  Component Value Date   INSULIN 18.0 10/07/2022   INSULIN 13.4 05/24/2022   INSULIN 15.8 02/18/2022   INSULIN 9.4 11/02/2021   INSULIN 18.2 06/25/2021   Vit D deficiency  She is taking Vit D 50,000 IU weekly.  Denies side effects.  Denies nausea, vomiting or muscle weakness.  Lab Results  Component Value Date   VD25OH 49.1 10/07/2022   VD25OH 46.3 02/18/2022   VD25OH 48.0 11/02/2021     PHYSICAL EXAM:  Blood pressure 135/89, pulse 83, temperature 98.2 F (36.8 C), height 5\' 7"  (1.702 m), weight 228 lb (103.4 kg), SpO2 97%. Body mass index is 35.71 kg/m.  General: She is overweight, cooperative, alert, well developed, and in no acute distress. PSYCH: Has normal mood, affect and thought process.   Extremities: No edema.  Neurologic: No gross sensory or motor deficits. No tremors or fasciculations noted.    DIAGNOSTIC DATA REVIEWED:  BMET    Component Value Date/Time    NA 141 10/07/2022 0805   K 4.7 10/07/2022 0805   CL 108 (H) 10/07/2022 0805   CO2 18 (L) 10/07/2022 0805   GLUCOSE 108 (H) 10/07/2022 0805   GLUCOSE 96 08/29/2019 0549   BUN 16 10/07/2022 0805   CREATININE 0.93 10/07/2022 0805   CALCIUM 9.3 10/07/2022 0805   GFRNONAA 84 05/27/2020 0938   GFRAA 96 05/27/2020 0938   Lab Results  Component Value Date   HGBA1C 5.9 (H) 10/07/2022   HGBA1C 6.1 (H) 06/19/2019   Lab Results  Component Value Date   INSULIN 18.0 10/07/2022   INSULIN 13.7 07/02/2019   Lab Results  Component Value Date   TSH 3.060 07/02/2019   CBC    Component Value Date/Time   WBC 9.1 10/07/2022 0805   WBC 12.1 (H) 08/29/2019 0549   RBC 4.69 10/07/2022 0805   RBC 4.54 08/29/2019 0549   HGB 14.3 10/07/2022 0805   HCT 42.0 10/07/2022 0805   PLT 247 10/07/2022 0805   MCV 90 10/07/2022 0805   MCH 30.5 10/07/2022 0805   MCH 30.0 08/29/2019 0549   MCHC 34.0 10/07/2022 0805   MCHC 32.9 08/29/2019 0549   RDW 12.6 10/07/2022 0805   Iron Studies No results found for: "IRON", "TIBC", "FERRITIN", "IRONPCTSAT" Lipid Panel     Component Value Date/Time   CHOL 201 (H) 10/07/2022 0805   TRIG 103 10/07/2022 0805   HDL 51 10/07/2022 0805   CHOLHDL 4.0 02/18/2022 1132   LDLCALC 131 (H) 10/07/2022 0805   Hepatic Function Panel     Component Value Date/Time   PROT 7.0 10/07/2022 0805   ALBUMIN 4.4 10/07/2022 0805   AST 17 10/07/2022 0805   ALT 16 10/07/2022 0805   ALKPHOS 71 10/07/2022 0805   BILITOT 0.3 10/07/2022 0805      Component Value Date/Time   TSH 3.060 07/02/2019 1158   Nutritional Lab Results  Component Value Date   VD25OH 49.1 10/07/2022   VD25OH 46.3 02/18/2022   VD25OH 48.0 11/02/2021     ASSESSMENT AND PLAN  TREATMENT PLAN FOR OBESITY:  Recommended Dietary Goals  Helen is currently in the action stage of change. As such, her goal is to continue weight management plan. She has agreed to the Category 2 Plan.  Behavioral  Intervention  We discussed the following Behavioral Modification Strategies today: increasing lean protein intake to established goals, decreasing simple carbohydrates , increasing vegetables, increasing water intake , work on tracking and journaling calories using tracking application, continue to work on implementation of reduced calorie nutritional plan, continue to practice mindfulness when eating, planning for success, and continue to work on maintaining a reduced calorie state, getting the recommended amount of protein, incorporating whole foods, making healthy choices, staying well hydrated and practicing mindfulness when eating..  Additional resources provided today: NA  Recommended Physical Activity  Goals  Vivianna has been advised to work up to 150 minutes of moderate intensity aerobic activity a week and strengthening exercises 2-3 times per week for cardiovascular health, weight loss maintenance and preservation of muscle mass.   She has agreed to Continue current level of physical activity , Increase the intensity, frequency or duration of strengthening exercises , and Increase the intensity, frequency or duration of aerobic exercises     Pharmacotherapy We discussed various medication options to help Harriett Sine with her weight loss efforts and we both agreed to consider her options. To discuss GLP-1s with GI.  Would like a clearance from GI prior to starting.  ASSOCIATED CONDITIONS ADDRESSED TODAY  Action/Plan  Gastroesophageal reflux disease, unspecified whether esophagitis present Continue to follow-up with GI.  Essential hypertension -     Lisinopril; Take 1 tablet (5 mg total) by mouth daily.  Dispense: 90 tablet; Refill: 0. Side effects discussed.   Prediabetes -     metFORMIN HCl; TAKE 1 TABLET BY MOUTH TWICE DAILY  Dispense: 180 tablet; Refill: 0.  Side effects discussed  Continue working on dietary changes, exercise and weight loss.  Vitamin D deficiency -     Vitamin D  (Ergocalciferol); Take 1 capsule (50,000 Units total) by mouth every 7 (seven) days.  Dispense: 12 capsule; Refill: 0  Generalized obesity  BMI 35.0-35.9,adult     Currently doesn't feel at this time that she is going to proceed with surgery due to her GERD.   She is scheduled for CPE with labs in April   Bio impedence showed a decrease in body fat % and increase in muscle mass    Return in about 4 weeks (around 07/05/2023).Marland Kitchen She was informed of the importance of frequent follow up visits to maximize her success with intensive lifestyle modifications for her multiple health conditions.   ATTESTASTION STATEMENTS:  Reviewed by clinician on day of visit: allergies, medications, problem list, medical history, surgical history, family history, social history, and previous encounter notes.    Theodis Sato. Taft Worthing FNP-C

## 2023-07-04 ENCOUNTER — Encounter: Payer: Self-pay | Admitting: Nurse Practitioner

## 2023-07-04 ENCOUNTER — Ambulatory Visit: Payer: 59 | Admitting: Nurse Practitioner

## 2023-07-04 VITALS — BP 112/70 | HR 60 | Temp 97.9°F | Ht 67.0 in | Wt 231.0 lb

## 2023-07-04 DIAGNOSIS — E66812 Obesity, class 2: Secondary | ICD-10-CM

## 2023-07-04 DIAGNOSIS — M6289 Other specified disorders of muscle: Secondary | ICD-10-CM

## 2023-07-04 DIAGNOSIS — N393 Stress incontinence (female) (male): Secondary | ICD-10-CM | POA: Diagnosis not present

## 2023-07-04 DIAGNOSIS — R7303 Prediabetes: Secondary | ICD-10-CM

## 2023-07-04 DIAGNOSIS — Z6836 Body mass index (BMI) 36.0-36.9, adult: Secondary | ICD-10-CM

## 2023-07-04 NOTE — Progress Notes (Signed)
 Office: [phone]  /  Fax: [phone]  WEIGHT SUMMARY AND BIOMETRICS  Weight Lost Since Last Visit: 0  Weight Gained Since Last Visit: 3 lb   Vitals Temp: 97.9 F (36.6 C) BP: 112/70 Pulse Rate: 60 SpO2: 97 %   Anthropometric Measurements Height: 5\' 7"  (1.702 m) Weight: 231 lb (104.8 kg) BMI (Calculated): 36.17 Weight at Last Visit: 228 lb Weight Lost Since Last Visit: 0 Weight Gained Since Last Visit: 3 lb Starting Weight: 242 lb Total Weight Loss (lbs): 11 lb (4.99 kg) Peak Weight: 259 lb   Body Composition  Body Fat %: 42.2 % Fat Mass (lbs): 97.6 lbs Muscle Mass (lbs): 127 lbs Total Body Water (lbs): 85.8 lbs Visceral Fat Rating : 11   Other Clinical Data Fasting: yes Labs: no Today's Visit #: 70 Starting Date: 07/02/19     HPI  Chief Complaint: OBESITY  Shelley Ryan is here to discuss her progress with her obesity treatment plan. She is on the the Category 2 Plan and states she is following her eating plan approximately 70 % of the time. She states she is exercising, she runs an exercise class for 60 minutes 1 day per week.   Interval History:  Since last office visit she has gained 3 pounds.  She is not able to proceed with bariatric surgery at this time due to abnormal EGD results on 06/06/23. Discussed findings with GI and was started on Carafate.  She has struggled with staying on a 1200 calorie meal plan due to polyphagia and cravings some sweets. Felt she did the best with Wegovy in the past but unable to take due to cost.     Pharmacotherapy for weight loss: She is not currently taking medications  for medical weight loss.    Previous pharmacotherapy for medical weight loss:   Wegovy, Phentermine, Contrave (didn't feel it was beneficial) and Saxenda-didn't have worsening of GERD with taking GLP-1s Unable to continue GLP-1s due to cost/coverage.  She stopped Phentermine due to not being effective.    Bariatric surgery:   Not able to proceed  with bariatric surgery at this time.    Incontinence  Has done pelvic floor PT and has been paying out of pocket. Worse with coughing, sneezing, exercising, ext.  History of waking up at night due to incontinence.  She states she can't do core exercises because her abd muscles will spasm.  She went to PT and felt it was beneficial.  Is requesting referral to someone in network to help with cost.     Prediabetes Last A1c was 5.9  Medication(s): Metformin 500mg  BID.  Denies side effects. (Started in 2020) Polyphagia:Yes Lab Results  Component Value Date   HGBA1C 5.9 (H) 10/07/2022   HGBA1C 5.9 (H) 05/24/2022   HGBA1C 6.0 (H) 02/18/2022   HGBA1C 6.0 (H) 11/02/2021   HGBA1C 5.7 (H) 06/25/2021   Lab Results  Component Value Date   INSULIN 18.0 10/07/2022   INSULIN 13.4 05/24/2022   INSULIN 15.8 02/18/2022   INSULIN 9.4 11/02/2021   INSULIN 18.2 06/25/2021    PHYSICAL EXAM:  Blood pressure 112/70, pulse 60, temperature 97.9 F (36.6 C), height 5\' 7"  (1.702 m), weight 231 lb (104.8 kg), SpO2 97%. Body mass index is 36.18 kg/m.  General: She is overweight, cooperative, alert, well developed, and in no acute distress. PSYCH: Has normal mood, affect and thought process.   Extremities: No edema.  Neurologic: No gross sensory or motor deficits. No tremors or fasciculations noted.  DIAGNOSTIC DATA REVIEWED:  BMET    Component Value Date/Time   NA 141 10/07/2022 0805   K 4.7 10/07/2022 0805   CL 108 (H) 10/07/2022 0805   CO2 18 (L) 10/07/2022 0805   GLUCOSE 108 (H) 10/07/2022 0805   GLUCOSE 96 08/29/2019 0549   BUN 16 10/07/2022 0805   CREATININE 0.93 10/07/2022 0805   CALCIUM 9.3 10/07/2022 0805   GFRNONAA 84 05/27/2020 0938   GFRAA 96 05/27/2020 0938   Lab Results  Component Value Date   HGBA1C 5.9 (H) 10/07/2022   HGBA1C 6.1 (H) 06/19/2019   Lab Results  Component Value Date   INSULIN 18.0 10/07/2022   INSULIN 13.7 07/02/2019   Lab Results  Component Value  Date   TSH 3.060 07/02/2019   CBC    Component Value Date/Time   WBC 9.1 10/07/2022 0805   WBC 12.1 (H) 08/29/2019 0549   RBC 4.69 10/07/2022 0805   RBC 4.54 08/29/2019 0549   HGB 14.3 10/07/2022 0805   HCT 42.0 10/07/2022 0805   PLT 247 10/07/2022 0805   MCV 90 10/07/2022 0805   MCH 30.5 10/07/2022 0805   MCH 30.0 08/29/2019 0549   MCHC 34.0 10/07/2022 0805   MCHC 32.9 08/29/2019 0549   RDW 12.6 10/07/2022 0805   Iron Studies No results found for: "IRON", "TIBC", "FERRITIN", "IRONPCTSAT" Lipid Panel     Component Value Date/Time   CHOL 201 (H) 10/07/2022 0805   TRIG 103 10/07/2022 0805   HDL 51 10/07/2022 0805   CHOLHDL 4.0 02/18/2022 1132   LDLCALC 131 (H) 10/07/2022 0805   Hepatic Function Panel     Component Value Date/Time   PROT 7.0 10/07/2022 0805   ALBUMIN 4.4 10/07/2022 0805   AST 17 10/07/2022 0805   ALT 16 10/07/2022 0805   ALKPHOS 71 10/07/2022 0805   BILITOT 0.3 10/07/2022 0805      Component Value Date/Time   TSH 3.060 07/02/2019 1158   Nutritional Lab Results  Component Value Date   VD25OH 49.1 10/07/2022   VD25OH 46.3 02/18/2022   VD25OH 48.0 11/02/2021     ASSESSMENT AND PLAN  TREATMENT PLAN FOR OBESITY:  Recommended Dietary Goals  Shelley Ryan is currently in the action stage of change. As such, her goal is to continue weight management plan. She has agreed to track and will review macros at next visit-fat, carbs and protein. To weigh and measure food.    Behavioral Intervention  We discussed the following Behavioral Modification Strategies today: increasing lean protein intake to established goals, decreasing simple carbohydrates , increasing vegetables, increasing fiber rich foods, work on tracking and journaling calories using tracking application, reading food labels , keeping healthy foods at home, continue to work on implementation of reduced calorie nutritional plan, continue to practice mindfulness when eating, planning for success,  and continue to work on maintaining a reduced calorie state, getting the recommended amount of protein, incorporating whole foods, making healthy choices, staying well hydrated and practicing mindfulness when eating..  Additional resources provided today: NA  Recommended Physical Activity Goals  Shelley Ryan has been advised to work up to 150 minutes of moderate intensity aerobic activity a week and strengthening exercises 2-3 times per week for cardiovascular health, weight loss maintenance and preservation of muscle mass.   She has agreed to refer to PT   Pharmacotherapy We discussed various medication options to help Shelley Ryan with her weight loss efforts and we both agreed to consider her options.  Discussed GLP-1 but would be  paying out of pocket.    Avoid Qsymia due to taking Zonegran.    ASSOCIATED CONDITIONS ADDRESSED TODAY  Action/Plan  Stress incontinence of urine -     Ambulatory referral to Physical Therapy  Pelvic floor dysfunction -     Ambulatory referral to Physical Therapy  Prediabetes Continue Metformin 500mg  BID.    Class 2 severe obesity due to excess calories with serious comorbidity and body mass index (BMI) of 36.0 to 36.9 in adult [facility])   She is scheduled for CPE with labs in April   Options discussed today: Referral to RD-to track and will review macros at next visit or with RD-will let me know if she would like to proceed with referral.   GLP-1s-needs to discuss cost with her husband   Return in about 4 weeks (around 08/01/2023).Marland Kitchen She was informed of the importance of frequent follow up visits to maximize her success with intensive lifestyle modifications for her multiple health conditions.   ATTESTASTION STATEMENTS:  Reviewed by clinician on day of visit: allergies, medications, problem list, medical history, surgical history, family history, social history, and previous encounter notes.     Theodis Sato. Noha Milberger FNP-C

## 2023-07-07 ENCOUNTER — Other Ambulatory Visit: Payer: Self-pay

## 2023-07-07 ENCOUNTER — Encounter: Payer: Self-pay | Admitting: Physical Therapy

## 2023-07-07 ENCOUNTER — Ambulatory Visit: Attending: Nurse Practitioner | Admitting: Physical Therapy

## 2023-07-07 DIAGNOSIS — M6289 Other specified disorders of muscle: Secondary | ICD-10-CM | POA: Diagnosis present

## 2023-07-07 DIAGNOSIS — R293 Abnormal posture: Secondary | ICD-10-CM | POA: Diagnosis present

## 2023-07-07 DIAGNOSIS — N393 Stress incontinence (female) (male): Secondary | ICD-10-CM | POA: Diagnosis present

## 2023-07-07 DIAGNOSIS — M6281 Muscle weakness (generalized): Secondary | ICD-10-CM

## 2023-07-07 DIAGNOSIS — R279 Unspecified lack of coordination: Secondary | ICD-10-CM

## 2023-07-07 NOTE — Therapy (Signed)
 OUTPATIENT PHYSICAL THERAPY FEMALE PELVIC EVALUATION   Patient Name: Shelley Ryan MRN: [id number] DOB:06/19/1975, 48 y.o., female Today's Date: 07/07/2023  END OF SESSION:  PT End of Session - 07/07/23 1655     Visit Number 1    Number of Visits 8    Authorization Type [employer]    PT Start Time 0330    PT Stop Time 0411    PT Time Calculation (min) 41 min    Activity Tolerance Patient tolerated treatment well    Behavior During Therapy WFL for tasks assessed/performed             Past Medical History:  Diagnosis Date   Anemia    Bilateral swelling of feet    BRCA1 negative 10/08/2013   CVA (cerebral infarction) 2002   Most likely from Hormonal Tx   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   Gallbladder problem    Gastric outlet obstruction    Gastroparesis    H/O blood clots    Heartburn    Hiatal hernia    Hyperlipidemia    Hypertension    IIH (idiopathic intracranial hypertension)    Inflammatory arthritis    Joint pain    Low vitamin D level    2013  17   OSA (obstructive sleep apnea)    PCOS (polycystic ovarian syndrome)    stroke on OCP hormonal therapy   Prediabetes    Pylorospasm    Stroke (cerebrum) (HCC)    Vitamin D deficiency    Past Surgical History:  Procedure Laterality Date   APPENDECTOMY     BREAST BIOPSY     CHOLECYSTECTOMY N/A 10/09/2013   Procedure: LAPAROSCOPIC CHOLECYSTECTOMY WITH INTRAOPERATIVE CHOLANGIOGRAM;  Surgeon: Valarie Merino, MD;  Location: [facility];  Service: General;  Laterality: N/A;   COLON SURGERY     KNEE ARTHROSCOPY Bilateral    PYLOROPLASTY     RIGHT OOPHORECTOMY     SALPINGECTOMY Bilateral    TYMPANOSTOMY TUBE PLACEMENT     x 2   WISDOM TOOTH EXTRACTION     Patient Active Problem List   Diagnosis Date Noted   Generalized obesity 06/14/2022   Myalgia 02/22/2022   Bilateral lower extremity edema 10/07/2021   Bilateral pendulous breasts 09/09/2021    Pyloric stenosis in adult 08/10/2021   Epigastric pain 08/10/2021   Bronchitis 05/15/2020   SOB (shortness of breath) 03/04/2020   OSA (obstructive sleep apnea) 03/04/2020   Mixed hyperlipidemia 02/04/2020   Migraine with aura and without status migrainosus, not intractable 01/23/2020   Vitamin D deficiency 10/23/2019   Prediabetes 10/09/2019   Headache 08/29/2019   Essential hypertension 08/29/2019   Esophageal hiatal hernia 06/20/2019   Stroke (HCC) 06/20/2019   Glaucoma suspect of both eyes 01/05/2019   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 01/05/2019   Myopia with astigmatism, bilateral 01/05/2019   Posterior vitreous detachment of both eyes 01/05/2019   Blepharitis of both upper and lower eyelid 01/05/2019   Family history of malignant hyperthermia 05/17/2016   GERD (gastroesophageal reflux disease) 02/25/2016   Chronic cholecystitis with calculus 10/30/2013   Acute cholecystitis 10/10/2013   Status post laparoscopic cholecystectomy June 2015 10/09/2013   Morbid obesity (HCC) 10/08/2013   PCOS (polycystic ovarian syndrome)     PCP: Sigmund Hazel, MD   REFERRING PROVIDER: Irene Limbo, FNP   REFERRING DIAG: N39.3 (ICD-10-CM) - Stress incontinence of urine M62.89 (ICD-10-CM) - Pelvic floor  dysfunction  THERAPY DIAG:  Muscle weakness (generalized)  Unspecified lack of coordination  Abnormal posture  Stress incontinence of urine  Pelvic floor dysfunction  Rationale for Evaluation and Treatment: Rehabilitation  ONSET DATE: 2025  SUBJECTIVE:                                                                                                                                                                                           SUBJECTIVE STATEMENT: Patient reports that she experiences stress urinary incontinence - coughing/laughing/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 She was having more urge incontinence in the past, but this is no  longer present (she has attended pelvic PT before for this). She reports abdominal wall cramping with any core strengthening activities.  Fluid intake: 1 caffeine drink 2x/day, 2 40 oz water bottles daily   PAIN:  Are you having pain? No NPRS scale: 0/10  PRECAUTIONS: None  RED FLAGS: None   WEIGHT BEARING RESTRICTIONS: No  FALLS:  Has patient fallen in last 6 months? No  OCCUPATION: physical therapist   ACTIVITY LEVEL : exercise classes, weightlifting   PLOF: Independent with basic ADLs  PATIENT GOALS: decrease urinary leakage   PERTINENT HISTORY:  Stroke at 79. Hypertonic on left side, has a hard time relaxing pelvic floor   Bowel: no issues to report with bowel movements or bowel habits   URINATION: Pain with urination: No Fully empty bladder: Yes:   Stream: Strong Urgency: Yes  Frequency: within normal limits  Leakage: Coughing, Sneezing, Laughing, Exercise, and Lifting Pads: No  INTERCOURSE:  Ability to have vaginal penetration Yes  Pain with intercourse: none DrynessNo Climax: yes Marinoff Scale: 0/3  PREGNANCY: Vaginal deliveries 1 Tearing Yes: 4th   PROLAPSE: None   OBJECTIVE:  Note: Objective measures were completed at Evaluation unless otherwise noted.  PATIENT SURVEYS:   PFIQ-7: 10  COGNITION: Overall cognitive status: Within functional limits for tasks assessed     SENSATION: Light touch: Appears intact  LUMBAR SPECIAL TESTS:  Single leg stance test: Positive  FUNCTIONAL TESTS:  Squat: within normal limits with slight leakage after 3 squats   GAIT: Assistive device utilized: None Comments: mild trendelenburg gait pattern with ambulation   POSTURE: rounded shoulders, forward head, and increased thoracic kyphosis   LUMBARAROM/PROM: within functional limits   A/PROM A/PROM  eval  Flexion   Extension   Right lateral flexion   Left lateral flexion   Right rotation   Left rotation    (Blank rows = not tested)  LOWER  EXTREMITY ROM: within functional limits   Active ROM Right eval Left eval  Hip flexion  Hip extension    Hip abduction    Hip adduction    Hip internal rotation    Hip external rotation    Knee flexion    Knee extension    Ankle dorsiflexion    Ankle plantarflexion    Ankle inversion    Ankle eversion     (Blank rows = not tested)  LOWER EXTREMITY MMT: 4/5 bilateral hips and knees grossly  MMT Right eval Left eval  Hip flexion    Hip extension    Hip abduction    Hip adduction    Hip internal rotation    Hip external rotation    Knee flexion    Knee extension    Ankle dorsiflexion    Ankle plantarflexion    Ankle inversion    Ankle eversion     (Blank rows = not tested) PALPATION:   General: adductor and hip flexor overactivity left>right   Pelvic Alignment: within normal limits   Abdominal: upper chest breathing, abdominal bracing at rest, decreased lower rib excursion                 External Perineal Exam: minimal dryness noted                              Internal Pelvic Floor: general weakness noted throughout, no pain with palpation of superficial or deep pelvic floor musculature, general lack of coordination noted with inhalation/exhalation   Patient confirms identification and approves PT to assess internal pelvic floor and treatment Yes No emotional/communication barriers or cognitive limitation. Patient is motivated to learn. Patient understands and agrees with treatment goals and plan. PT explains patient will be examined in standing, sitting, and lying down to see how their muscles and joints work. When they are ready, they will be asked to remove their underwear so PT can examine their perineum. The patient is also given the option of providing their own chaperone as one is not provided in our facility. The patient also has the right and is explained the right to defer or refuse any part of the evaluation or treatment including the internal exam. With  the patient's consent, PT will use one gloved finger to gently assess the muscles of the pelvic floor, seeing how well it contracts and relaxes and if there is muscle symmetry. After, the patient will get dressed and PT and patient will discuss exam findings and plan of care. PT and patient discuss plan of care, schedule, attendance policy and HEP activities.  PELVIC MMT:   MMT eval  Vaginal 3/5, 10 quick flicks, 10 second hold   Internal Anal Sphincter   External Anal Sphincter   Puborectalis   Diastasis Recti   (Blank rows = not tested)        TONE: Within normal limits - patient tends to hold onto pelvic tension once it is created. Has a difficult time relaxing.   PROLAPSE: N/A   TODAY'S TREATMENT:  DATE:   EVAL 07/07/23: Examination completed, findings reviewed, pt educated on POC, HEP, and self care. Pt motivated to participate in PT and agreeable to attempt recommendations.   Neuro re-ed:  Hooklying diaphragmatic breathing + pelvic floor lengthening/shortening with inhalation/exhalation 2x10  Hooklying pelvic floor quick flicks + diaphragmatic breathing 2x15 Self care: Pelvic floor relative anatomy, connection between the diaphragm and pelvic floor, intraabdominal pressure management with the pelvic floor, pelvic floor active range of motion education and how breathing affects this.   PATIENT EDUCATION:  Education details: Pelvic floor relative anatomy, connection between the diaphragm and pelvic floor, intraabdominal pressure management with the pelvic floor, pelvic floor active range of motion education and how breathing affects this.  Person educated: Patient Education method: Explanation, Demonstration, Tactile cues, Verbal cues, and Handouts Education comprehension: verbalized understanding, returned demonstration, verbal cues required, tactile cues  required, and needs further education  HOME EXERCISE PROGRAM: Access Code: [email or web address] URL: [email or web address] Date: 07/07/2023 Prepared by: Earna Coder  Exercises - Supine Pelvic Floor Contraction  - 1 x daily - 7 x weekly - 2 sets - 10 reps - Quick Flick Pelvic Floor Contractions in Hooklying  - 1 x daily - 7 x weekly - 2 sets - 10 reps  ASSESSMENT:  CLINICAL IMPRESSION: Patient is a 48 y.o. female  who was seen today for physical therapy evaluation and treatment for stress urinary incontinence. She has seen a pelvic PT before for urge incontinence, which has improved. Since then, she has been experiencing stress urinary incontinence occasionally with sneezing/coughing/lifting. Internal examination revealed general weakness noted throughout, no pain with palpation of superficial or deep pelvic floor musculature, general lack of coordination noted with inhalation/exhalation. Patient had no pain or leakage following today's session. Pt would benefit from additional PT to further address deficits.    OBJECTIVE IMPAIRMENTS: decreased coordination, decreased endurance, decreased mobility, decreased ROM, and decreased strength.   ACTIVITY LIMITATIONS: continence  PARTICIPATION LIMITATIONS:  N/A  PERSONAL FACTORS: Past/current experiences and Time since onset of injury/illness/exacerbation are also affecting patient's functional outcome.   REHAB POTENTIAL: Good  CLINICAL DECISION MAKING: Stable/uncomplicated  EVALUATION COMPLEXITY: Low   GOALS: Goals reviewed with patient? Yes  SHORT TERM GOALS: Target date: 08/04/2023  Pt will be independent with HEP.  Baseline: Goal status: INITIAL  2.  Pt will be independent with diaphragmatic breathing and down training activities in order to improve pelvic floor relaxation. Baseline:  Goal status: INITIAL  3.  Pt will be independent with the knack, urge suppression technique, and double voiding in order to improve bladder  habits and decrease urinary incontinence.   Baseline:  Goal status: INITIAL  LONG TERM GOALS: Target date: 01/07/2024  Pt will be independent with advanced HEP.  Baseline:  Goal status: INITIAL  2.  Pt to demonstrate improved coordination of pelvic floor and breathing mechanics with 10# squat with appropriate synergistic patterns to decrease pain and leakage at least 75% of the time.   Baseline:  Goal status: INITIAL  3.  Pt will demonstrate normal pelvic floor muscle tone and A/ROM, able to achieve 4/5 strength with contractions and 10 sec endurance, in order to provide appropriate lumbopelvic support in functional activities.   Baseline:  Goal status: INITIAL  PLAN:  PT FREQUENCY: 1-2x/week  PT DURATION: 8 weeks  PLANNED INTERVENTIONS: 97110-Therapeutic exercises, 97530- Therapeutic activity, 97112- Neuromuscular re-education, 97535- Self Care, [id number]- Manual therapy, Taping, Dry Needling, Joint mobilization, Spinal mobilization, Scar mobilization, Cryotherapy, and Moist heat  PLAN FOR NEXT SESSION: continued  pelvic floor AROM, assess internally to see if pt is able to relax   Omar Person, PT 07/07/2023, 5:20 PM

## 2023-08-02 ENCOUNTER — Ambulatory Visit: Attending: Nurse Practitioner | Admitting: Physical Therapy

## 2023-08-02 DIAGNOSIS — M6281 Muscle weakness (generalized): Secondary | ICD-10-CM | POA: Insufficient documentation

## 2023-08-02 DIAGNOSIS — R279 Unspecified lack of coordination: Secondary | ICD-10-CM | POA: Diagnosis present

## 2023-08-02 DIAGNOSIS — R293 Abnormal posture: Secondary | ICD-10-CM | POA: Insufficient documentation

## 2023-08-02 NOTE — Therapy (Signed)
 OUTPATIENT PHYSICAL THERAPY FEMALE PELVIC TREATMENT   Patient Name: Shelley Ryan MRN: [id number] DOB:02-06-1976, 48 y.o., female Today's Date: 08/02/2023  END OF SESSION:  PT End of Session - 08/02/23 0838     Visit Number 2    Number of Visits 8    Authorization Type [facility]    PT Start Time [id number]    PT Stop Time 0803    PT Time Calculation (min) 25 min    Activity Tolerance Patient tolerated treatment well    Behavior During Therapy [facility] for tasks assessed/performed              Past Medical History:  Diagnosis Date   Anemia    Bilateral swelling of feet    BRCA1 negative 10/08/2013   CVA (cerebral infarction) 2002   Most likely from Hormonal Tx   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   Gallbladder problem    Gastric outlet obstruction    Gastroparesis    H/O blood clots    Heartburn    Hiatal hernia    Hyperlipidemia    Hypertension    IIH (idiopathic intracranial hypertension)    Inflammatory arthritis    Joint pain    Low vitamin D level    2013  17   OSA (obstructive sleep apnea)    PCOS (polycystic ovarian syndrome)    stroke on OCP hormonal therapy   Prediabetes    Pylorospasm    Stroke (cerebrum) (HCC)    Vitamin D deficiency    Past Surgical History:  Procedure Laterality Date   APPENDECTOMY     BREAST BIOPSY     CHOLECYSTECTOMY N/A 10/09/2013   Procedure: LAPAROSCOPIC CHOLECYSTECTOMY WITH INTRAOPERATIVE CHOLANGIOGRAM;  Surgeon: Valarie Merino, MD;  Location: [facility];  Service: General;  Laterality: N/A;   COLON SURGERY     KNEE ARTHROSCOPY Bilateral    PYLOROPLASTY     RIGHT OOPHORECTOMY     SALPINGECTOMY Bilateral    TYMPANOSTOMY TUBE PLACEMENT     x 2   WISDOM TOOTH EXTRACTION     Patient Active Problem List   Diagnosis Date Noted   Generalized obesity 06/14/2022   Myalgia 02/22/2022   Bilateral lower extremity edema 10/07/2021   Bilateral pendulous breasts 09/09/2021    Pyloric stenosis in adult 08/10/2021   Epigastric pain 08/10/2021   Bronchitis 05/15/2020   SOB (shortness of breath) 03/04/2020   OSA (obstructive sleep apnea) 03/04/2020   Mixed hyperlipidemia 02/04/2020   Migraine with aura and without status migrainosus, not intractable 01/23/2020   Vitamin D deficiency 10/23/2019   Prediabetes 10/09/2019   Headache 08/29/2019   Essential hypertension 08/29/2019   Esophageal hiatal hernia 06/20/2019   Stroke (HCC) 06/20/2019   Glaucoma suspect of both eyes 01/05/2019   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 01/05/2019   Myopia with astigmatism, bilateral 01/05/2019   Posterior vitreous detachment of both eyes 01/05/2019   Blepharitis of both upper and lower eyelid 01/05/2019   Family history of malignant hyperthermia 05/17/2016   GERD (gastroesophageal reflux disease) 02/25/2016   Chronic cholecystitis with calculus 10/30/2013   Acute cholecystitis 10/10/2013   Status post laparoscopic cholecystectomy June 2015 10/09/2013   Morbid obesity (HCC) 10/08/2013   PCOS (polycystic ovarian syndrome)     PCP: Sigmund Hazel, MD   REFERRING PROVIDER: Irene Limbo, FNP   REFERRING DIAG: N39.3 (ICD-10-CM) - Stress incontinence of urine M62.89 (ICD-10-CM) - Pelvic  floor dysfunction  THERAPY DIAG:  Muscle weakness (generalized)  Unspecified lack of coordination  Abnormal posture  Rationale for Evaluation and Treatment: Rehabilitation  ONSET DATE: 2025  SUBJECTIVE:                                                                                                                                                                                           SUBJECTIVE STATEMENT: Patient reports since starting her exercises, She is having more instances of nocturia - her whole underwear is soaked. No changes in stress incontinence. She has been noticing a difficulty in relaxing her pelvic floor at home.  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 She was having more urge incontinence in the past, but this is no longer present (she has attended pelvic PT before for this). She reports abdominal wall cramping with any core strengthening activities.  Fluid intake: 1 caffeine drink 2x/day, 2 40 oz water bottles daily   PAIN:  Are you having pain? No NPRS scale: 0/10  PRECAUTIONS: None  RED FLAGS: None   WEIGHT BEARING RESTRICTIONS: No  FALLS:  Has patient fallen in last 6 months? No  OCCUPATION: physical therapist   ACTIVITY LEVEL : exercise classes, weightlifting   PLOF: Independent with basic ADLs  PATIENT GOALS: decrease urinary leakage   PERTINENT HISTORY:  Stroke at 1. Hypertonic on left side, has a hard time relaxing pelvic floor   Bowel: no issues to report with bowel movements or bowel habits   URINATION: Pain with urination: No Fully empty bladder: Yes:   Stream: Strong Urgency: Yes  Frequency: within normal limits  Leakage: Coughing, Sneezing, Laughing, Exercise, and Lifting Pads: No  INTERCOURSE:  Ability to have vaginal penetration Yes  Pain with intercourse: none DrynessNo Climax: yes Marinoff Scale: 0/3  PREGNANCY: Vaginal deliveries 1 Tearing Yes: 4th   PROLAPSE: None   OBJECTIVE:  Note: Objective measures were completed at Evaluation unless otherwise noted.  PATIENT SURVEYS:   PFIQ-7: 10  COGNITION: Overall cognitive status: Within functional limits for tasks assessed     SENSATION: Light touch: Appears intact  LUMBAR SPECIAL TESTS:  Single leg stance test: Positive  FUNCTIONAL TESTS:  Squat: within normal limits with slight leakage after 3 squats   GAIT: Assistive device utilized: None Comments: mild trendelenburg gait pattern with ambulation   POSTURE: rounded shoulders, forward head, and increased thoracic kyphosis   LUMBARAROM/PROM: within functional  limits   A/PROM A/PROM  eval  Flexion   Extension   Right lateral flexion   Left lateral flexion   Right  rotation   Left rotation    (Blank rows = not tested)  LOWER EXTREMITY ROM: within functional limits   Active ROM Right eval Left eval  Hip flexion    Hip extension    Hip abduction    Hip adduction    Hip internal rotation    Hip external rotation    Knee flexion    Knee extension    Ankle dorsiflexion    Ankle plantarflexion    Ankle inversion    Ankle eversion     (Blank rows = not tested)  LOWER EXTREMITY MMT: 4/5 bilateral hips and knees grossly  MMT Right eval Left eval  Hip flexion    Hip extension    Hip abduction    Hip adduction    Hip internal rotation    Hip external rotation    Knee flexion    Knee extension    Ankle dorsiflexion    Ankle plantarflexion    Ankle inversion    Ankle eversion     (Blank rows = not tested) PALPATION:   General: adductor and hip flexor overactivity left>right   Pelvic Alignment: within normal limits   Abdominal: upper chest breathing, abdominal bracing at rest, decreased lower rib excursion                 External Perineal Exam: minimal dryness noted                              Internal Pelvic Floor: general weakness noted throughout, no pain with palpation of superficial or deep pelvic floor musculature, general lack of coordination noted with inhalation/exhalation   Patient confirms identification and approves PT to assess internal pelvic floor and treatment Yes No emotional/communication barriers or cognitive limitation. Patient is motivated to learn. Patient understands and agrees with treatment goals and plan. PT explains patient will be examined in standing, sitting, and lying down to see how their muscles and joints work. When they are ready, they will be asked to remove their underwear so PT can examine their perineum. The patient is also given the option of providing their own chaperone as one is not  provided in our facility. The patient also has the right and is explained the right to defer or refuse any part of the evaluation or treatment including the internal exam. With the patient's consent, PT will use one gloved finger to gently assess the muscles of the pelvic floor, seeing how well it contracts and relaxes and if there is muscle symmetry. After, the patient will get dressed and PT and patient will discuss exam findings and plan of care. PT and patient discuss plan of care, schedule, attendance policy and HEP activities.  PELVIC MMT:   MMT eval  Vaginal 3/5, 10 quick flicks, 10 second hold   Internal Anal Sphincter   External Anal Sphincter   Puborectalis   Diastasis Recti   (Blank rows = not tested)        TONE: Within normal limits - patient tends to hold onto pelvic tension once it is created. Has a difficult time relaxing.   PROLAPSE: N/A   TODAY'S TREATMENT:  DATE:   EVAL 07/07/23: Examination completed, findings reviewed, pt educated on POC, HEP, and self care. Pt motivated to participate in PT and agreeable to attempt recommendations.   Neuro re-ed:  Hooklying diaphragmatic breathing + pelvic floor lengthening/shortening with inhalation/exhalation 2x10  Hooklying pelvic floor quick flicks + diaphragmatic breathing 2x15 Self care: Pelvic floor relative anatomy, connection between the diaphragm and pelvic floor, intraabdominal pressure management with the pelvic floor, pelvic floor active range of motion education and how breathing affects this.   08/02/23:  Neuro re-ed:  Hooklying diaphragmatic breathing + pelvic floor lengthening/shortening with inhalation/exhalation 2x10  Hooklying pelvic floor quick flicks + diaphragmatic breathing 2x15 Manual therapy  Internal vaginal pelvic floor muscle release to decrease overall tone and muscle tension -  primarily deep bilateral obturator internus musculature and puborectalis.  PATIENT EDUCATION:  Education details: Pelvic floor relative anatomy, connection between the diaphragm and pelvic floor, intraabdominal pressure management with the pelvic floor, pelvic floor active range of motion education and how breathing affects this.  Person educated: Patient Education method: Explanation, Demonstration, Tactile cues, Verbal cues, and Handouts Education comprehension: verbalized understanding, returned demonstration, verbal cues required, tactile cues required, and needs further education  HOME EXERCISE PROGRAM: Access Code: [email or web address] URL: [email or web address] Date: 07/07/2023 Prepared by: Earna Coder  Exercises - Supine Pelvic Floor Contraction  - 1 x daily - 7 x weekly - 2 sets - 10 reps - Quick Flick Pelvic Floor Contractions in Hooklying  - 1 x daily - 7 x weekly - 2 sets - 10 reps  ASSESSMENT:  CLINICAL IMPRESSION: Patient is a 48 y.o. female  who was seen today for physical therapy treatment for stress urinary incontinence. Patient's urge incontinence had been persistent and she reports difficulty relaxing her pelvic floor. Internal manual therapy performed today to decrease overall pelvic floor tension secondary to increased muscle tone in the pelvic floor. With internal release, bilateral obturator internus muscles relaxed accordingly and patient could feel this tension decrease. Patient encouraged to continue diaphragmatic breathing practices to decrease pelvic floor tension and improve overall active range of motion of the pelvic floor. Patient had no pain or leakage following today's session. Pt would benefit from additional PT to further address deficits.    OBJECTIVE IMPAIRMENTS: decreased coordination, decreased endurance, decreased mobility, decreased ROM, and decreased strength.   ACTIVITY LIMITATIONS: continence  PARTICIPATION LIMITATIONS:  N/A  PERSONAL  FACTORS: Past/current experiences and Time since onset of injury/illness/exacerbation are also affecting patient's functional outcome.   REHAB POTENTIAL: Good  CLINICAL DECISION MAKING: Stable/uncomplicated  EVALUATION COMPLEXITY: Low   GOALS: Goals reviewed with patient? Yes  SHORT TERM GOALS: Target date: 08/04/2023  Pt will be independent with HEP.  Baseline: Goal status: INITIAL  2.  Pt will be independent with diaphragmatic breathing and down training activities in order to improve pelvic floor relaxation. Baseline:  Goal status: INITIAL  3.  Pt will be independent with the knack, urge suppression technique, and double voiding in order to improve bladder habits and decrease urinary incontinence.   Baseline:  Goal status: INITIAL  LONG TERM GOALS: Target date: 01/07/2024  Pt will be independent with advanced HEP.  Baseline:  Goal status: INITIAL  2.  Pt to demonstrate improved coordination of pelvic floor and breathing mechanics with 10# squat with appropriate synergistic patterns to decrease pain and leakage at least 75% of the time.   Baseline:  Goal status: INITIAL  3.  Pt will demonstrate normal pelvic floor muscle tone and A/ROM, able to achieve  4/5 strength with contractions and 10 sec endurance, in order to provide appropriate lumbopelvic support in functional activities.   Baseline:  Goal status: INITIAL  PLAN:  PT FREQUENCY: 1-2x/week  PT DURATION: 8 weeks  PLANNED INTERVENTIONS: 97110-Therapeutic exercises, 97530- Therapeutic activity, 97112- Neuromuscular re-education, 97535- Self Care, 95188- Manual therapy, Taping, Dry Needling, Joint mobilization, Spinal mobilization, Scar mobilization, Cryotherapy, and Moist heat  PLAN FOR NEXT SESSION: internal to decrease pelvic floor tension at rest, WAND discussion, introduce gentle core and hip strengthening, dry needle adductors/quads/glutes  Omar Person, PT 08/02/2023, 8:42 AM

## 2023-08-08 ENCOUNTER — Encounter: Payer: Self-pay | Admitting: Nurse Practitioner

## 2023-08-08 ENCOUNTER — Ambulatory Visit: Admitting: Nurse Practitioner

## 2023-08-08 VITALS — BP 130/72 | HR 73 | Temp 98.3°F | Ht 67.0 in | Wt 231.0 lb

## 2023-08-08 DIAGNOSIS — Z6836 Body mass index (BMI) 36.0-36.9, adult: Secondary | ICD-10-CM | POA: Diagnosis not present

## 2023-08-08 DIAGNOSIS — R632 Polyphagia: Secondary | ICD-10-CM

## 2023-08-08 DIAGNOSIS — E66812 Obesity, class 2: Secondary | ICD-10-CM | POA: Diagnosis not present

## 2023-08-08 MED ORDER — TIRZEPATIDE-WEIGHT MANAGEMENT 2.5 MG/0.5ML ~~LOC~~ SOLN: 2.5000 mg | SUBCUTANEOUS | 0 refills | Status: DC

## 2023-08-08 NOTE — Progress Notes (Signed)
 Office: [phone]  /  Fax: [phone]  WEIGHT SUMMARY AND BIOMETRICS  Weight Lost Since Last Visit: 0lb  Weight Gained Since Last Visit: 0lb   Vitals Temp: 98.3 F (36.8 C) BP: 130/72 Pulse Rate: 73 SpO2: 97 %   Anthropometric Measurements Height: 5\' 7"  (1.702 m) Weight: 231 lb (104.8 kg) BMI (Calculated): 36.17 Weight at Last Visit: 231lb Weight Lost Since Last Visit: 0lb Weight Gained Since Last Visit: 0lb Starting Weight: 242lb Total Weight Loss (lbs): 11 lb (4.99 kg)   Body Composition  Body Fat %: 41.5 % Fat Mass (lbs): 96 lbs Muscle Mass (lbs): 128.4 lbs Total Body Water (lbs): 83.4 lbs Visceral Fat Rating : 11   Other Clinical Data Fasting: Yes Labs: No Today's Visit #: 87 Starting Date: 07/02/19     HPI  Chief Complaint: OBESITY  Shelley Ryan is here to discuss her progress with her obesity treatment plan. She is on the the Category 2 Plan and states she is following her eating plan approximately 90 % of the time. 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    Interval History:  Since last office visit she has maintained her weight.  She is averaging around 1500 calories and 80-90 grams of protein daily.  She is drinking water and protein daily.  She is struggling with polyphagia and snacking if she eats < 1500 calories.  She has been struggling with more headaches and is scheduled for a MRI on May 2nd.  She is drinking G2 and water.    Pharmacotherapy for weight loss: She is not currently taking medications  for medical weight loss.    Previous pharmacotherapy for medical weight loss:   Reginal Lutes (denied side effects), Phentermine, Contrave (didn't feel it was beneficial) and Saxenda-didn't have worsening of GERD with taking GLP-1s Unable to continue GLP-1s due to cost/coverage.  She stopped Phentermine due to not being effective.   Bariatric surgery:   Not  able to proceed with bariatric surgery at this time.     PHYSICAL EXAM:  Blood pressure 130/72, pulse 73, temperature 98.3 F (36.8 C), height 5\' 7"  (1.702 m), weight 231 lb (104.8 kg), SpO2 97%. Body mass index is 36.18 kg/m.  General: She is overweight, cooperative, alert, well developed, and in no acute distress. PSYCH: Has normal mood, affect and thought process.   Extremities: No edema.  Neurologic: No gross sensory or motor deficits. No tremors or fasciculations noted.    DIAGNOSTIC DATA REVIEWED:  BMET    Component Value Date/Time   NA 141 10/07/2022 0805   K 4.7 10/07/2022 0805   CL 108 (H) 10/07/2022 0805   CO2 18 (L) 10/07/2022 0805   GLUCOSE 108 (H) 10/07/2022 0805   GLUCOSE 96 08/29/2019 0549   BUN 16 10/07/2022 0805   CREATININE 0.93 10/07/2022 0805   CALCIUM 9.3 10/07/2022 0805   GFRNONAA 84 05/27/2020 0938   GFRAA 96 05/27/2020 0938   Lab Results  Component Value Date   HGBA1C 5.9 (H) 10/07/2022   HGBA1C 6.1 (H) 06/19/2019   Lab Results  Component Value Date   INSULIN 18.0 10/07/2022   INSULIN 13.7 07/02/2019   Lab Results  Component Value Date   TSH 3.060 07/02/2019   CBC    Component Value Date/Time   WBC 9.1 10/07/2022 0805   WBC 12.1 (H) 08/29/2019 0549   RBC 4.69 10/07/2022 0805   RBC 4.54 08/29/2019 0549  HGB 14.3 10/07/2022 0805   HCT 42.0 10/07/2022 0805   PLT 247 10/07/2022 0805   MCV 90 10/07/2022 0805   MCH 30.5 10/07/2022 0805   MCH 30.0 08/29/2019 0549   MCHC 34.0 10/07/2022 0805   MCHC 32.9 08/29/2019 0549   RDW 12.6 10/07/2022 0805   Iron Studies No results found for: "IRON", "TIBC", "FERRITIN", "IRONPCTSAT" Lipid Panel     Component Value Date/Time   CHOL 201 (H) 10/07/2022 0805   TRIG 103 10/07/2022 0805   HDL 51 10/07/2022 0805   CHOLHDL 4.0 02/18/2022 1132   LDLCALC 131 (H) 10/07/2022 0805   Hepatic Function Panel     Component Value Date/Time   PROT 7.0 10/07/2022 0805   ALBUMIN 4.4 10/07/2022 0805    AST 17 10/07/2022 0805   ALT 16 10/07/2022 0805   ALKPHOS 71 10/07/2022 0805   BILITOT 0.3 10/07/2022 0805      Component Value Date/Time   TSH 3.060 07/02/2019 1158   Nutritional Lab Results  Component Value Date   VD25OH 49.1 10/07/2022   VD25OH 46.3 02/18/2022   VD25OH 48.0 11/02/2021     ASSESSMENT AND PLAN  TREATMENT PLAN FOR OBESITY:  Recommended Dietary Goals  Shelley Ryan is currently in the action stage of change. As such, her goal is to continue weight management plan. She has agreed to the Category 2 Plan.  Behavioral Intervention  We discussed the following Behavioral Modification Strategies today: increasing lean protein intake to established goals, decreasing simple carbohydrates , increasing vegetables, increasing water intake , work on meal planning and preparation, work on tracking and journaling calories using tracking application, reading food labels , keeping healthy foods at home, and continue to work on maintaining a reduced calorie state, getting the recommended amount of protein, incorporating whole foods, making healthy choices, staying well hydrated and practicing mindfulness when eating..  Additional resources provided today: NA  Recommended Physical Activity Goals  Shelley Ryan has been advised to work up to 150 minutes of moderate intensity aerobic activity a week and strengthening exercises 2-3 times per week for cardiovascular health, weight loss maintenance and preservation of muscle mass.   She has agreed to Think about enjoyable ways to increase daily physical activity and overcoming barriers to exercise, Increase physical activity in their day and reduce sedentary time (increase NEAT)., Increase the intensity, frequency or duration of strengthening exercises , and Increase the intensity, frequency or duration of aerobic exercises     Pharmacotherapy We discussed various medication options to help Shelley Ryan with her weight loss efforts and we both agreed to  start Zepbound 2.5mg . Side effects discussed.  To discuss with ophthalmology prior to starting.    Avoid Qsymia due to taking Zonegran.    Contraindications:  Pancreatitis (active gallstones) Medullary thyroid cancer High triglycerides (>500)-will need labs prior to starting Multiple Endocrine Neoplasia syndrome type 2 (MEN 2) Trying to get pregnant Breastfeeding Use with caution with taking insulin or sulfonylureas (will need to monitor blood sugars for hypoglycemia)  ASSOCIATED CONDITIONS ADDRESSED TODAY  Action/Plan  Polyphagia -     Tirzepatide-Weight Management; Inject 2.5 mg into the skin once a week.  Dispense: 2 mL; Refill: 0  Class 2 severe obesity due to excess calories with serious comorbidity and body mass index (BMI) of 36.0 to 36.9 in adult [facility]) -     Tirzepatide-Weight Management; Inject 2.5 mg into the skin once a week.  Dispense: 2 mL; Refill: 0     Saw PCP April 2nd for follow  up and labs: TSH 1.58 CMP wnl  A1c 5.9 Vit D 41    Return in about 4 weeks (around 09/05/2023).Shelley Ryan She was informed of the importance of frequent follow up visits to maximize her success with intensive lifestyle modifications for her multiple health conditions.   ATTESTASTION STATEMENTS:  Reviewed by clinician on day of visit: allergies, medications, problem list, medical history, surgical history, family history, social history, and previous encounter notes.     Crist Dominion. Aissatou Fronczak FNP-C

## 2023-08-08 NOTE — Patient Instructions (Signed)

## 2023-08-09 ENCOUNTER — Ambulatory Visit: Admitting: Physical Therapy

## 2023-08-09 DIAGNOSIS — R293 Abnormal posture: Secondary | ICD-10-CM

## 2023-08-09 DIAGNOSIS — M6281 Muscle weakness (generalized): Secondary | ICD-10-CM | POA: Diagnosis not present

## 2023-08-09 DIAGNOSIS — R279 Unspecified lack of coordination: Secondary | ICD-10-CM

## 2023-08-09 NOTE — Therapy (Signed)
 OUTPATIENT PHYSICAL THERAPY FEMALE PELVIC TREATMENT   Patient Name: Shelley Ryan MRN: [id number] DOB:1975/09/26, 48 y.o., female Today's Date: 08/09/2023  END OF SESSION:  PT End of Session - 08/09/23 0758     Visit Number 3    Number of Visits 8    Authorization Type [employer]    PT Start Time [phone]    PT Stop Time 0800    PT Time Calculation (min) 24 min    Activity Tolerance Patient tolerated treatment well    Behavior During Therapy [facility] for tasks assessed/performed               Past Medical History:  Diagnosis Date   Anemia    Bilateral swelling of feet    BRCA1 negative 10/08/2013   CVA (cerebral infarction) 2002   Most likely from Hormonal Tx   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   Gallbladder problem    Gastric outlet obstruction    Gastroparesis    H/O blood clots    Heartburn    Hiatal hernia    Hyperlipidemia    Hypertension    IIH (idiopathic intracranial hypertension)    Inflammatory arthritis    Joint pain    Low vitamin D level    2013  17   OSA (obstructive sleep apnea)    PCOS (polycystic ovarian syndrome)    stroke on OCP hormonal therapy   Prediabetes    Pylorospasm    Stroke (cerebrum) (HCC)    Vitamin D deficiency    Past Surgical History:  Procedure Laterality Date   APPENDECTOMY     BREAST BIOPSY     CHOLECYSTECTOMY N/A 10/09/2013   Procedure: LAPAROSCOPIC CHOLECYSTECTOMY WITH INTRAOPERATIVE CHOLANGIOGRAM;  Surgeon: Azucena Bollard, MD;  Location: [facility];  Service: General;  Laterality: N/A;   COLON SURGERY     KNEE ARTHROSCOPY Bilateral    PYLOROPLASTY     RIGHT OOPHORECTOMY     SALPINGECTOMY Bilateral    TYMPANOSTOMY TUBE PLACEMENT     x 2   WISDOM TOOTH EXTRACTION     Patient Active Problem List   Diagnosis Date Noted   Generalized obesity 06/14/2022   Myalgia 02/22/2022   Bilateral lower extremity edema 10/07/2021   Bilateral pendulous breasts  09/09/2021   Pyloric stenosis in adult 08/10/2021   Epigastric pain 08/10/2021   Bronchitis 05/15/2020   SOB (shortness of breath) 03/04/2020   OSA (obstructive sleep apnea) 03/04/2020   Mixed hyperlipidemia 02/04/2020   Migraine with aura and without status migrainosus, not intractable 01/23/2020   Vitamin D deficiency 10/23/2019   Prediabetes 10/09/2019   Headache 08/29/2019   Essential hypertension 08/29/2019   Esophageal hiatal hernia 06/20/2019   Stroke (HCC) 06/20/2019   Glaucoma suspect of both eyes 01/05/2019   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 01/05/2019   Myopia with astigmatism, bilateral 01/05/2019   Posterior vitreous detachment of both eyes 01/05/2019   Blepharitis of both upper and lower eyelid 01/05/2019   Family history of malignant hyperthermia 05/17/2016   GERD (gastroesophageal reflux disease) 02/25/2016   Chronic cholecystitis with calculus 10/30/2013   Acute cholecystitis 10/10/2013   Status post laparoscopic cholecystectomy June 2015 10/09/2013   Morbid obesity (HCC) 10/08/2013   PCOS (polycystic ovarian syndrome)     PCP: Perley Bradley, MD   REFERRING PROVIDER: Helane Lloyd, FNP   REFERRING DIAG: N39.3 (ICD-10-CM) - Stress incontinence of urine M62.89 (ICD-10-CM) -  Pelvic floor dysfunction  THERAPY DIAG:  Unspecified lack of coordination  Muscle weakness (generalized)  Abnormal posture  Rationale for Evaluation and Treatment: Rehabilitation  ONSET DATE: 2025  SUBJECTIVE:                                                                                                                                                                                           SUBJECTIVE STATEMENT: Patient saw her neurologist Friday and she has to have another MRI - she was told her ICP might be increased due to higher tone - this is scheduled for May 2nd. She was given a muscle relaxer that did not help with any symptoms, it just made her drowsy. She  reports less wetness at night, less urge incontinence in general. Stress incontinence has been minimal. No pelvic pain to report or changes in bowel movements. She is still experiencing increased tone on the left side of the body.  She felt some relief from internal vaginal release treatment last session.   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 She was having more urge incontinence in the past, but this is no longer present (she has attended pelvic PT before for this). She reports abdominal wall cramping with any core strengthening activities.  Fluid intake: 1 caffeine drink 2x/day, 2 40 oz water bottles daily   PAIN:  Are you having pain? No NPRS scale: 0/10  PRECAUTIONS: None  RED FLAGS: None   WEIGHT BEARING RESTRICTIONS: No  FALLS:  Has patient fallen in last 6 months? No  OCCUPATION: physical therapist   ACTIVITY LEVEL : exercise classes, weightlifting   PLOF: Independent with basic ADLs  PATIENT GOALS: decrease urinary leakage   PERTINENT HISTORY:  Stroke at 15. Hypertonic on left side, has a hard time relaxing pelvic floor   Bowel: no issues to report with bowel movements or bowel habits   URINATION: Pain with urination: No Fully empty bladder: Yes:   Stream: Strong Urgency: Yes  Frequency: within normal limits  Leakage: Coughing, Sneezing, Laughing, Exercise, and Lifting Pads: No  INTERCOURSE:  Ability to have vaginal penetration Yes  Pain with intercourse: none DrynessNo Climax: yes Marinoff Scale: 0/3  PREGNANCY: Vaginal deliveries 1 Tearing Yes: 4th   PROLAPSE: None   OBJECTIVE:  Note: Objective measures were completed at Evaluation unless otherwise noted.  PATIENT SURVEYS:   PFIQ-7: 10  COGNITION: Overall cognitive status: Within functional limits for tasks assessed     SENSATION: Light touch: Appears intact  LUMBAR SPECIAL  TESTS:  Single leg stance test: Positive  FUNCTIONAL TESTS:  Squat: within normal limits with slight leakage after 3 squats   GAIT: Assistive device utilized: None Comments: mild trendelenburg gait pattern with ambulation   POSTURE: rounded shoulders, forward head, and increased thoracic kyphosis   LUMBARAROM/PROM: within functional limits   A/PROM A/PROM  eval  Flexion   Extension   Right lateral flexion   Left lateral flexion   Right rotation   Left rotation    (Blank rows = not tested)  LOWER EXTREMITY ROM: within functional limits   Active ROM Right eval Left eval  Hip flexion    Hip extension    Hip abduction    Hip adduction    Hip internal rotation    Hip external rotation    Knee flexion    Knee extension    Ankle dorsiflexion    Ankle plantarflexion    Ankle inversion    Ankle eversion     (Blank rows = not tested)  LOWER EXTREMITY MMT: 4/5 bilateral hips and knees grossly  MMT Right eval Left eval  Hip flexion    Hip extension    Hip abduction    Hip adduction    Hip internal rotation    Hip external rotation    Knee flexion    Knee extension    Ankle dorsiflexion    Ankle plantarflexion    Ankle inversion    Ankle eversion     (Blank rows = not tested) PALPATION:   General: adductor and hip flexor overactivity left>right   Pelvic Alignment: within normal limits   Abdominal: upper chest breathing, abdominal bracing at rest, decreased lower rib excursion                 External Perineal Exam: minimal dryness noted                              Internal Pelvic Floor: general weakness noted throughout, no pain with palpation of superficial or deep pelvic floor musculature, general lack of coordination noted with inhalation/exhalation   Patient confirms identification and approves PT to assess internal pelvic floor and treatment Yes No emotional/communication barriers or cognitive limitation. Patient is motivated to learn. Patient  understands and agrees with treatment goals and plan. PT explains patient will be examined in standing, sitting, and lying down to see how their muscles and joints work. When they are ready, they will be asked to remove their underwear so PT can examine their perineum. The patient is also given the option of providing their own chaperone as one is not provided in our facility. The patient also has the right and is explained the right to defer or refuse any part of the evaluation or treatment including the internal exam. With the patient's consent, PT will use one gloved finger to gently assess the muscles of the pelvic floor, seeing how well it contracts and relaxes and if there is muscle symmetry. After, the patient will get dressed and PT and patient will discuss exam findings and plan of care. PT and patient discuss plan of care, schedule, attendance policy and HEP activities.  PELVIC MMT:   MMT eval  Vaginal 3/5, 10 quick flicks, 10 second hold   Internal Anal Sphincter   External Anal Sphincter   Puborectalis   Diastasis Recti   (Blank rows = not tested)        TONE: Within normal limits - patient tends to hold onto pelvic tension once it is  created. Has a difficult time relaxing.   PROLAPSE: N/A   TODAY'S TREATMENT:                                                                                                                              DATE:   EVAL 07/07/23: Examination completed, findings reviewed, pt educated on POC, HEP, and self care. Pt motivated to participate in PT and agreeable to attempt recommendations.   Neuro re-ed:  Hooklying diaphragmatic breathing + pelvic floor lengthening/shortening with inhalation/exhalation 2x10  Hooklying pelvic floor quick flicks + diaphragmatic breathing 2x15 Self care: Pelvic floor relative anatomy, connection between the diaphragm and pelvic floor, intraabdominal pressure management with the pelvic floor, pelvic floor active range of motion  education and how breathing affects this.   08/02/23:  Neuro re-ed:  Hooklying diaphragmatic breathing + pelvic floor lengthening/shortening with inhalation/exhalation 2x10  Hooklying pelvic floor quick flicks + diaphragmatic breathing 2x15 Manual therapy  Internal vaginal pelvic floor muscle release to decrease overall tone and muscle tension - primarily deep bilateral obturator internus musculature and puborectalis.  08/09/23:  Neuro re-ed:  Hooklying diaphragmatic breathing + pelvic floor lengthening/shortening with inhalation/exhalation 2x10  Hooklying pelvic floor quick flicks + diaphragmatic breathing 2x15 Manual therapy  Internal vaginal pelvic floor muscle release to decrease overall tone and muscle tension - primarily deep bilateral obturator internus musculature and puborectalis. External adductor/quadricep insertion soft tissue mobilization to decrease increased tone/muscle tension at pelvis   PATIENT EDUCATION:  Education details: Pelvic floor relative anatomy, connection between the diaphragm and pelvic floor, intraabdominal pressure management with the pelvic floor, pelvic floor active range of motion education and how breathing affects this.  Person educated: Patient Education method: Explanation, Demonstration, Tactile cues, Verbal cues, and Handouts Education comprehension: verbalized understanding, returned demonstration, verbal cues required, tactile cues required, and needs further education  HOME EXERCISE PROGRAM: Access Code: [email or web address] URL: [email or web address] Date: 07/07/2023 Prepared by: Robbin Chill  Exercises - Supine Pelvic Floor Contraction  - 1 x daily - 7 x weekly - 2 sets - 10 reps - Quick Flick Pelvic Floor Contractions in Hooklying  - 1 x daily - 7 x weekly - 2 sets - 10 reps  ASSESSMENT:  CLINICAL IMPRESSION: Patient is a 48 y.o. female  who was seen today for physical therapy treatment for stress urinary incontinence. Patient's urge  incontinence has improved since last visit, and she is waking with wet underwear less frequently. Internal manual therapy performed today to decrease overall pelvic floor tension secondary to increased muscle tone in the pelvic floor. With internal release, bilateral obturator internus muscles relaxed accordingly and patient could feel this tension decrease. Patient demonstrates increased posterior pelvic floor muscle tension bilaterally today. Patient encouraged to continue diaphragmatic breathing practices to decrease pelvic floor tension and improve overall active range of motion of the pelvic floor. Discussion of wand with patient went very well and patient plans to purchase one. Patient had no pain or leakage following  today's session. Pt would benefit from additional PT to further address deficits.    OBJECTIVE IMPAIRMENTS: decreased coordination, decreased endurance, decreased mobility, decreased ROM, and decreased strength.   ACTIVITY LIMITATIONS: continence  PARTICIPATION LIMITATIONS:  N/A  PERSONAL FACTORS: Past/current experiences and Time since onset of injury/illness/exacerbation are also affecting patient's functional outcome.   REHAB POTENTIAL: Good  CLINICAL DECISION MAKING: Stable/uncomplicated  EVALUATION COMPLEXITY: Low   GOALS: Goals reviewed with patient? Yes  SHORT TERM GOALS: Target date: 08/04/2023  Pt will be independent with HEP.  Baseline: Goal status: INITIAL  2.  Pt will be independent with diaphragmatic breathing and down training activities in order to improve pelvic floor relaxation. Baseline:  Goal status: INITIAL  3.  Pt will be independent with the knack, urge suppression technique, and double voiding in order to improve bladder habits and decrease urinary incontinence.   Baseline:  Goal status: INITIAL  LONG TERM GOALS: Target date: 01/07/2024  Pt will be independent with advanced HEP.  Baseline:  Goal status: INITIAL  2.  Pt to demonstrate  improved coordination of pelvic floor and breathing mechanics with 10# squat with appropriate synergistic patterns to decrease pain and leakage at least 75% of the time.   Baseline:  Goal status: INITIAL  3.  Pt will demonstrate normal pelvic floor muscle tone and A/ROM, able to achieve 4/5 strength with contractions and 10 sec endurance, in order to provide appropriate lumbopelvic support in functional activities.   Baseline:  Goal status: INITIAL  PLAN:  PT FREQUENCY: 1-2x/week  PT DURATION: 8 weeks  PLANNED INTERVENTIONS: 97110-Therapeutic exercises, 97530- Therapeutic activity, 97112- Neuromuscular re-education, 97535- Self Care, 96045- Manual therapy, Taping, Dry Needling, Joint mobilization, Spinal mobilization, Scar mobilization, Cryotherapy, and Moist heat  PLAN FOR NEXT SESSION: internal to decrease pelvic floor tension at rest, WAND discussion, introduce gentle core and hip strengthening, dry needle adductors/quads/glutes  Marni Sins, PT 08/09/2023, 7:59 AM

## 2023-08-17 ENCOUNTER — Ambulatory Visit: Admitting: Physical Therapy

## 2023-08-17 DIAGNOSIS — R293 Abnormal posture: Secondary | ICD-10-CM

## 2023-08-17 DIAGNOSIS — M6281 Muscle weakness (generalized): Secondary | ICD-10-CM | POA: Diagnosis not present

## 2023-08-17 DIAGNOSIS — R279 Unspecified lack of coordination: Secondary | ICD-10-CM

## 2023-08-17 NOTE — Therapy (Signed)
 OUTPATIENT PHYSICAL THERAPY FEMALE PELVIC TREATMENT   Patient Name: Shelley Ryan MRN: [id number] DOB:Dec 30, 1975, 48 y.o., female Today's Date: 08/17/2023  END OF SESSION:  PT End of Session - 08/17/23 0821     Visit Number 4    Number of Visits 8    Authorization Type [facility]    PT Start Time [phone]    PT Stop Time 0845    PT Time Calculation (min) 35 min    Activity Tolerance Patient tolerated treatment well    Behavior During Therapy [facility] for tasks assessed/performed                Past Medical History:  Diagnosis Date   Anemia    Bilateral swelling of feet    BRCA1 negative 10/08/2013   CVA (cerebral infarction) 2002   Most likely from Hormonal Tx   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   Gallbladder problem    Gastric outlet obstruction    Gastroparesis    H/O blood clots    Heartburn    Hiatal hernia    Hyperlipidemia    Hypertension    IIH (idiopathic intracranial hypertension)    Inflammatory arthritis    Joint pain    Low vitamin D  level    2013  17   OSA (obstructive sleep apnea)    PCOS (polycystic ovarian syndrome)    stroke on OCP hormonal therapy   Prediabetes    Pylorospasm    Stroke (cerebrum) (HCC)    Vitamin D  deficiency    Past Surgical History:  Procedure Laterality Date   APPENDECTOMY     BREAST BIOPSY     CHOLECYSTECTOMY N/A 10/09/2013   Procedure: LAPAROSCOPIC CHOLECYSTECTOMY WITH INTRAOPERATIVE CHOLANGIOGRAM;  Surgeon: Azucena Bollard, MD;  Location: [facility];  Service: General;  Laterality: N/A;   COLON SURGERY     KNEE ARTHROSCOPY Bilateral    PYLOROPLASTY     RIGHT OOPHORECTOMY     SALPINGECTOMY Bilateral    TYMPANOSTOMY TUBE PLACEMENT     x 2   WISDOM TOOTH EXTRACTION     Patient Active Problem List   Diagnosis Date Noted   Generalized obesity 06/14/2022   Myalgia 02/22/2022   Bilateral lower extremity edema 10/07/2021   Bilateral pendulous breasts  09/09/2021   Pyloric stenosis in adult 08/10/2021   Epigastric pain 08/10/2021   Bronchitis 05/15/2020   SOB (shortness of breath) 03/04/2020   OSA (obstructive sleep apnea) 03/04/2020   Mixed hyperlipidemia 02/04/2020   Migraine with aura and without status migrainosus, not intractable 01/23/2020   Vitamin D  deficiency 10/23/2019   Prediabetes 10/09/2019   Headache 08/29/2019   Essential hypertension 08/29/2019   Esophageal hiatal hernia 06/20/2019   Stroke (HCC) 06/20/2019   Glaucoma suspect of both eyes 01/05/2019   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 01/05/2019   Myopia with astigmatism, bilateral 01/05/2019   Posterior vitreous detachment of both eyes 01/05/2019   Blepharitis of both upper and lower eyelid 01/05/2019   Family history of malignant hyperthermia 05/17/2016   GERD (gastroesophageal reflux disease) 02/25/2016   Chronic cholecystitis with calculus 10/30/2013   Acute cholecystitis 10/10/2013   Status post laparoscopic cholecystectomy June 2015 10/09/2013   Morbid obesity (HCC) 10/08/2013   PCOS (polycystic ovarian syndrome)     PCP: Perley Bradley, MD   REFERRING PROVIDER: Helane Lloyd, FNP   REFERRING DIAG: N39.3 (ICD-10-CM) - Stress incontinence of urine M62.89 (ICD-10-CM) -  Pelvic floor dysfunction  THERAPY DIAG:  Unspecified lack of coordination  Muscle weakness (generalized)  Abnormal posture  Rationale for Evaluation and Treatment: Rehabilitation  ONSET DATE: 2025  SUBJECTIVE:                                                                                                                                                                                           SUBJECTIVE STATEMENT: Patient arrives 10 minutes late to PT session today. He is still waiting on her MRI. She reports less wetness at night, less urge incontinence in general. Stress incontinence has been minimal - exercise will induce the leakage. Transitional movements cause  her to leak: meaning, sit to stands and supine to sit transitions will cause her to leak. A series of coughs or sneezes causes her to leak also. No pelvic pain to report or changes in bowel movements. She is still experiencing increased tone on the left side of the body. She ordered a wand and is waiting on it to arrive.   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 She was having more urge incontinence in the past, but this is no longer present (she has attended pelvic PT before for this). She reports abdominal wall cramping with any core strengthening activities.  Fluid intake: 1 caffeine drink 2x/day, 2 40 oz water bottles daily   PAIN:  Are you having pain? No NPRS scale: 0/10  PRECAUTIONS: None  RED FLAGS: None   WEIGHT BEARING RESTRICTIONS: No  FALLS:  Has patient fallen in last 6 months? No  OCCUPATION: physical therapist   ACTIVITY LEVEL : exercise classes, weightlifting   PLOF: Independent with basic ADLs  PATIENT GOALS: decrease urinary leakage   PERTINENT HISTORY:  Stroke at 18. Hypertonic on left side, has a hard time relaxing pelvic floor   Bowel: no issues to report with bowel movements or bowel habits   URINATION: Pain with urination: No Fully empty bladder: Yes:   Stream: Strong Urgency: Yes  Frequency: within normal limits  Leakage: Coughing, Sneezing, Laughing, Exercise, and Lifting Pads: No  INTERCOURSE:  Ability to have vaginal penetration Yes  Pain with intercourse: none DrynessNo Climax: yes Marinoff Scale: 0/3  PREGNANCY: Vaginal deliveries 1 Tearing Yes: 4th   PROLAPSE: None   OBJECTIVE:  Note: Objective measures were completed at Evaluation unless otherwise noted.  PATIENT SURVEYS:   PFIQ-7: 10  COGNITION: Overall cognitive status: Within functional limits for tasks assessed     SENSATION: Light touch: Appears  intact  LUMBAR SPECIAL TESTS:  Single leg stance test: Positive  FUNCTIONAL TESTS:  Squat: within normal limits with slight leakage after 3 squats   GAIT: Assistive device utilized: None Comments: mild trendelenburg gait pattern with ambulation   POSTURE: rounded shoulders, forward head, and increased thoracic kyphosis   LUMBARAROM/PROM: within functional limits   A/PROM A/PROM  eval  Flexion   Extension   Right lateral flexion   Left lateral flexion   Right rotation   Left rotation    (Blank rows = not tested)  LOWER EXTREMITY ROM: within functional limits   Active ROM Right eval Left eval  Hip flexion    Hip extension    Hip abduction    Hip adduction    Hip internal rotation    Hip external rotation    Knee flexion    Knee extension    Ankle dorsiflexion    Ankle plantarflexion    Ankle inversion    Ankle eversion     (Blank rows = not tested)  LOWER EXTREMITY MMT: 4/5 bilateral hips and knees grossly  MMT Right eval Left eval  Hip flexion    Hip extension    Hip abduction    Hip adduction    Hip internal rotation    Hip external rotation    Knee flexion    Knee extension    Ankle dorsiflexion    Ankle plantarflexion    Ankle inversion    Ankle eversion     (Blank rows = not tested) PALPATION:   General: adductor and hip flexor overactivity left>right   Pelvic Alignment: within normal limits   Abdominal: upper chest breathing, abdominal bracing at rest, decreased lower rib excursion                 External Perineal Exam: minimal dryness noted                              Internal Pelvic Floor: general weakness noted throughout, no pain with palpation of superficial or deep pelvic floor musculature, general lack of coordination noted with inhalation/exhalation   Patient confirms identification and approves PT to assess internal pelvic floor and treatment Yes No emotional/communication barriers or cognitive limitation. Patient is motivated  to learn. Patient understands and agrees with treatment goals and plan. PT explains patient will be examined in standing, sitting, and lying down to see how their muscles and joints work. When they are ready, they will be asked to remove their underwear so PT can examine their perineum. The patient is also given the option of providing their own chaperone as one is not provided in our facility. The patient also has the right and is explained the right to defer or refuse any part of the evaluation or treatment including the internal exam. With the patient's consent, PT will use one gloved finger to gently assess the muscles of the pelvic floor, seeing how well it contracts and relaxes and if there is muscle symmetry. After, the patient will get dressed and PT and patient will discuss exam findings and plan of care. PT and patient discuss plan of care, schedule, attendance policy and HEP activities.  PELVIC MMT:   MMT eval  Vaginal 3/5, 10 quick flicks, 10 second hold   Internal Anal Sphincter   External Anal Sphincter   Puborectalis   Diastasis Recti   (Blank rows = not tested)        TONE: Within normal limits - patient tends to hold onto pelvic tension  once it is created. Has a difficult time relaxing.   PROLAPSE: N/A   TODAY'S TREATMENT:                                                                                                                              DATE:   08/02/23:  Neuro re-ed:  Hooklying diaphragmatic breathing + pelvic floor lengthening/shortening with inhalation/exhalation 2x10  Hooklying pelvic floor quick flicks + diaphragmatic breathing 2x15 Manual therapy  Internal vaginal pelvic floor muscle release to decrease overall tone and muscle tension - primarily deep bilateral obturator internus musculature and puborectalis.  08/09/23:  Neuro re-ed:  Hooklying diaphragmatic breathing + pelvic floor lengthening/shortening with inhalation/exhalation 2x10  Hooklying pelvic  floor quick flicks + diaphragmatic breathing 2x15 Manual therapy  Internal vaginal pelvic floor muscle release to decrease overall tone and muscle tension - primarily deep bilateral obturator internus musculature and puborectalis. External adductor/quadricep insertion soft tissue mobilization to decrease increased tone/muscle tension at pelvis   08/17/23:  Neuro re-ed:  Hooklying diaphragmatic breathing + pelvic floor lengthening/shortening with inhalation/exhalation 2x10  Hooklying pelvic floor quick flicks + diaphragmatic breathing 2x15 Manual therapy  Internal vaginal pelvic floor muscle release to decrease overall tone and muscle tension - primarily deep bilateral obturator internus musculature and puborectalis. External adductor/quadricep insertion soft tissue mobilization to decrease increased tone/muscle tension at pelvis  Abdominal STM focused on left lower quadrant to decrease abdominal tone   PATIENT EDUCATION:  Education details: Pelvic floor relative anatomy, connection between the diaphragm and pelvic floor, intraabdominal pressure management with the pelvic floor, pelvic floor active range of motion education and how breathing affects this.  Person educated: Patient Education method: Explanation, Demonstration, Tactile cues, Verbal cues, and Handouts Education comprehension: verbalized understanding, returned demonstration, verbal cues required, tactile cues required, and needs further education  HOME EXERCISE PROGRAM: Access Code: [email or web address] URL: [email or web address] Date: 07/07/2023 Prepared by: Robbin Chill  Exercises - Supine Pelvic Floor Contraction  - 1 x daily - 7 x weekly - 2 sets - 10 reps - Quick Flick Pelvic Floor Contractions in Hooklying  - 1 x daily - 7 x weekly - 2 sets - 10 reps  ASSESSMENT:  CLINICAL IMPRESSION: Patient is a 48 y.o. female  who was seen today for physical therapy treatment for stress urinary incontinence. Patient's urge  incontinence has improved since last visit, she has not had any instances of urge incontinence. She is still experiencing stress incontinence with repetitive sneezing/coughing and repetitive transfers. Internal manual therapy performed today to decrease overall pelvic floor tension secondary to increased muscle tone in the pelvic floor. With internal release, bilateral obturator internus muscles relaxed accordingly and patient could feel this tension decrease. Patient demonstrates increased posterior pelvic floor muscle tension bilaterally today. Patient encouraged to continue diaphragmatic breathing practices to decrease pelvic floor tension and improve overall active range of motion of the pelvic floor. Patient demonstrates significant abdominal tone in literal lower quadrants of the abdomen, so  we performed STM to decrease this tone and extension restriction due to tension. Patient plans to bring her wand in next session for educational training. Patient had no pain or leakage following today's session. Pt would benefit from additional PT to further address deficits.    OBJECTIVE IMPAIRMENTS: decreased coordination, decreased endurance, decreased mobility, decreased ROM, and decreased strength.   ACTIVITY LIMITATIONS: continence  PARTICIPATION LIMITATIONS:  N/A  PERSONAL FACTORS: Past/current experiences and Time since onset of injury/illness/exacerbation are also affecting patient's functional outcome.   REHAB POTENTIAL: Good  CLINICAL DECISION MAKING: Stable/uncomplicated  EVALUATION COMPLEXITY: Low   GOALS: Goals reviewed with patient? Yes  SHORT TERM GOALS: Target date: 08/04/2023  Pt will be independent with HEP.  Baseline: Goal status: INITIAL  2.  Pt will be independent with diaphragmatic breathing and down training activities in order to improve pelvic floor relaxation. Baseline:  Goal status: INITIAL  3.  Pt will be independent with the knack, urge suppression technique, and  double voiding in order to improve bladder habits and decrease urinary incontinence.   Baseline:  Goal status: INITIAL  LONG TERM GOALS: Target date: 01/07/2024  Pt will be independent with advanced HEP.  Baseline:  Goal status: INITIAL  2.  Pt to demonstrate improved coordination of pelvic floor and breathing mechanics with 10# squat with appropriate synergistic patterns to decrease pain and leakage at least 75% of the time.   Baseline:  Goal status: INITIAL  3.  Pt will demonstrate normal pelvic floor muscle tone and A/ROM, able to achieve 4/5 strength with contractions and 10 sec endurance, in order to provide appropriate lumbopelvic support in functional activities.   Baseline:  Goal status: INITIAL  PLAN:  PT FREQUENCY: 1-2x/week  PT DURATION: 8 weeks  PLANNED INTERVENTIONS: 97110-Therapeutic exercises, 97530- Therapeutic activity, 97112- Neuromuscular re-education, 97535- Self Care, [id number]- Manual therapy, Taping, Dry Needling, Joint mobilization, Spinal mobilization, Scar mobilization, Cryotherapy, and Moist heat  PLAN FOR NEXT SESSION: internal to decrease pelvic floor tension at rest, WAND discussion, introduce gentle core and hip strengthening, dry needle adductors/quads/glutes  Marni Sins, PT 08/17/2023, 8:45 AM

## 2023-08-23 ENCOUNTER — Ambulatory Visit: Admitting: Physical Therapy

## 2023-08-23 ENCOUNTER — Telehealth: Payer: Self-pay | Admitting: Physical Therapy

## 2023-08-23 NOTE — Telephone Encounter (Signed)
 Informed patient of 7:30 AM appointment this morning. Patient asked to call back regarding future appointments.   Robbin Chill, PT, DPT 08/23/23 10:04 AM

## 2023-08-25 ENCOUNTER — Other Ambulatory Visit: Payer: Self-pay | Admitting: Nurse Practitioner

## 2023-08-25 DIAGNOSIS — E66812 Obesity, class 2: Secondary | ICD-10-CM

## 2023-08-25 DIAGNOSIS — R632 Polyphagia: Secondary | ICD-10-CM

## 2023-08-30 ENCOUNTER — Ambulatory Visit: Attending: Nurse Practitioner | Admitting: Physical Therapy

## 2023-08-30 DIAGNOSIS — R279 Unspecified lack of coordination: Secondary | ICD-10-CM | POA: Insufficient documentation

## 2023-08-30 DIAGNOSIS — M6281 Muscle weakness (generalized): Secondary | ICD-10-CM | POA: Insufficient documentation

## 2023-08-30 DIAGNOSIS — R293 Abnormal posture: Secondary | ICD-10-CM | POA: Insufficient documentation

## 2023-08-30 NOTE — Patient Instructions (Signed)
 How to use a pelvic floor wand at home: use this as needed when you are experiencing increased tone in the pelvic floor or if you are planning to have intercourse and want to warm up the tissues. Positioning: as relaxed as you can get your hips (butterfly on your back with pillows supporting hips or standing with foot on a stool) Apply water based lubricant to yourself and to the wand. Place the tip of the wand at the vaginal opening. Inhale as your pelvic floor relaxes and you slowly insert the wand, moving side to side at the entrance of the vaginal canal  Perform half moon sweeping motions or sustained pressure techniques over spots of increased tension in the vagina.  Do this no longer than 10 minutes at a time

## 2023-08-30 NOTE — Therapy (Signed)
 OUTPATIENT PHYSICAL THERAPY FEMALE PELVIC TREATMENT   Patient Name: Shelley Ryan MRN: [id number] DOB:March 28, 1976, 48 y.o., female Today's Date: 08/30/2023  END OF SESSION:  PT End of Session - 08/30/23 0812     Visit Number 5    Number of Visits 8    Authorization Type [facility]    PT Start Time [phone]    PT Stop Time [phone]    PT Time Calculation (min) 34 min    Activity Tolerance Patient tolerated treatment well    Behavior During Therapy [facility] for tasks assessed/performed                 Past Medical History:  Diagnosis Date   Anemia    Bilateral swelling of feet    BRCA1 negative 10/08/2013   CVA (cerebral infarction) 2002   Most likely from Hormonal Tx   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   Gallbladder problem    Gastric outlet obstruction    Gastroparesis    H/O blood clots    Heartburn    Hiatal hernia    Hyperlipidemia    Hypertension    IIH (idiopathic intracranial hypertension)    Inflammatory arthritis    Joint pain    Low vitamin D  level    2013  17   OSA (obstructive sleep apnea)    PCOS (polycystic ovarian syndrome)    stroke on OCP hormonal therapy   Prediabetes    Pylorospasm    Stroke (cerebrum) (HCC)    Vitamin D  deficiency    Past Surgical History:  Procedure Laterality Date   APPENDECTOMY     BREAST BIOPSY     CHOLECYSTECTOMY N/A 10/09/2013   Procedure: LAPAROSCOPIC CHOLECYSTECTOMY WITH INTRAOPERATIVE CHOLANGIOGRAM;  Surgeon: Azucena Bollard, MD;  Location: [facility];  Service: General;  Laterality: N/A;   COLON SURGERY     KNEE ARTHROSCOPY Bilateral    PYLOROPLASTY     RIGHT OOPHORECTOMY     SALPINGECTOMY Bilateral    TYMPANOSTOMY TUBE PLACEMENT     x 2   WISDOM TOOTH EXTRACTION     Patient Active Problem List   Diagnosis Date Noted   Generalized obesity 06/14/2022   Myalgia 02/22/2022   Bilateral lower extremity edema 10/07/2021   Bilateral pendulous breasts  09/09/2021   Pyloric stenosis in adult 08/10/2021   Epigastric pain 08/10/2021   Bronchitis 05/15/2020   SOB (shortness of breath) 03/04/2020   OSA (obstructive sleep apnea) 03/04/2020   Mixed hyperlipidemia 02/04/2020   Migraine with aura and without status migrainosus, not intractable 01/23/2020   Vitamin D  deficiency 10/23/2019   Prediabetes 10/09/2019   Headache 08/29/2019   Essential hypertension 08/29/2019   Esophageal hiatal hernia 06/20/2019   Stroke (HCC) 06/20/2019   Glaucoma suspect of both eyes 01/05/2019   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 01/05/2019   Myopia with astigmatism, bilateral 01/05/2019   Posterior vitreous detachment of both eyes 01/05/2019   Blepharitis of both upper and lower eyelid 01/05/2019   Family history of malignant hyperthermia 05/17/2016   GERD (gastroesophageal reflux disease) 02/25/2016   Chronic cholecystitis with calculus 10/30/2013   Acute cholecystitis 10/10/2013   Status post laparoscopic cholecystectomy June 2015 10/09/2013   Morbid obesity (HCC) 10/08/2013   PCOS (polycystic ovarian syndrome)     PCP: Perley Bradley, MD   REFERRING PROVIDER: Helane Lloyd, FNP   REFERRING DIAG: N39.3 (ICD-10-CM) - Stress incontinence of urine M62.89 (  ICD-10-CM) - Pelvic floor dysfunction  THERAPY DIAG:  Unspecified lack of coordination  Muscle weakness (generalized)  Abnormal posture  Rationale for Evaluation and Treatment: Rehabilitation  ONSET DATE: 2025  SUBJECTIVE:                                                                                                                                                                                           SUBJECTIVE STATEMENT: Patient arrives 8 minutes late to PT session today. She reports that her pelvic floor is feeling okay today. Her abdomen is feeling tight and spastic -  she is noticing a correlation between the abdominal spasms and the pelvic floor being overactive. No  urge incontinence these days, more so just stress incontinence. She had her MRI on Friday and plans to call to learn more about her results. No pelvic pain to report. She is still experiencing increased tone on the left side of the body. She ordered a wand and brought it in today - [employer].   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 She was having more urge incontinence in the past, but this is no longer present (she has attended pelvic PT before for this). She reports abdominal wall cramping with any core strengthening activities.  Fluid intake: 1 caffeine drink 2x/day, 2 40 oz water bottles daily   PAIN:  Are you having pain? No NPRS scale: 0/10  PRECAUTIONS: None  RED FLAGS: None   WEIGHT BEARING RESTRICTIONS: No  FALLS:  Has patient fallen in last 6 months? No  OCCUPATION: physical therapist   ACTIVITY LEVEL : exercise classes, weightlifting   PLOF: Independent with basic ADLs  PATIENT GOALS: decrease urinary leakage   PERTINENT HISTORY:  Stroke at 25. Hypertonic on left side, has a hard time relaxing pelvic floor   Bowel: no issues to report with bowel movements or bowel habits   URINATION: Pain with urination: No Fully empty bladder: Yes:   Stream: Strong Urgency: Yes  Frequency: within normal limits  Leakage: Coughing, Sneezing, Laughing, Exercise, and Lifting Pads: No  INTERCOURSE:  Ability to have vaginal penetration Yes  Pain with intercourse: none DrynessNo Climax: yes Marinoff Scale: 0/3  PREGNANCY: Vaginal deliveries 1 Tearing Yes: 4th   PROLAPSE: None   OBJECTIVE:  Note: Objective measures were completed at Evaluation unless otherwise noted.  PATIENT SURVEYS:   PFIQ-7: 10  COGNITION: Overall cognitive status: Within functional limits for tasks assessed     SENSATION: Light touch: Appears intact  LUMBAR SPECIAL TESTS:   Single leg stance test: Positive  FUNCTIONAL  TESTS:  Squat: within normal limits with slight leakage after 3 squats   GAIT: Assistive device utilized: None Comments: mild trendelenburg gait pattern with ambulation   POSTURE: rounded shoulders, forward head, and increased thoracic kyphosis   LUMBARAROM/PROM: within functional limits   A/PROM A/PROM  eval  Flexion   Extension   Right lateral flexion   Left lateral flexion   Right rotation   Left rotation    (Blank rows = not tested)  LOWER EXTREMITY ROM: within functional limits   Active ROM Right eval Left eval  Hip flexion    Hip extension    Hip abduction    Hip adduction    Hip internal rotation    Hip external rotation    Knee flexion    Knee extension    Ankle dorsiflexion    Ankle plantarflexion    Ankle inversion    Ankle eversion     (Blank rows = not tested)  LOWER EXTREMITY MMT: 4/5 bilateral hips and knees grossly  MMT Right eval Left eval  Hip flexion    Hip extension    Hip abduction    Hip adduction    Hip internal rotation    Hip external rotation    Knee flexion    Knee extension    Ankle dorsiflexion    Ankle plantarflexion    Ankle inversion    Ankle eversion     (Blank rows = not tested) PALPATION:   General: adductor and hip flexor overactivity left>right   Pelvic Alignment: within normal limits   Abdominal: upper chest breathing, abdominal bracing at rest, decreased lower rib excursion                 External Perineal Exam: minimal dryness noted                              Internal Pelvic Floor: general weakness noted throughout, no pain with palpation of superficial or deep pelvic floor musculature, general lack of coordination noted with inhalation/exhalation   Patient confirms identification and approves PT to assess internal pelvic floor and treatment Yes No emotional/communication barriers or cognitive limitation. Patient is motivated to learn. Patient understands  and agrees with treatment goals and plan. PT explains patient will be examined in standing, sitting, and lying down to see how their muscles and joints work. When they are ready, they will be asked to remove their underwear so PT can examine their perineum. The patient is also given the option of providing their own chaperone as one is not provided in our facility. The patient also has the right and is explained the right to defer or refuse any part of the evaluation or treatment including the internal exam. With the patient's consent, PT will use one gloved finger to gently assess the muscles of the pelvic floor, seeing how well it contracts and relaxes and if there is muscle symmetry. After, the patient will get dressed and PT and patient will discuss exam findings and plan of care. PT and patient discuss plan of care, schedule, attendance policy and HEP activities.  PELVIC MMT:   MMT eval  Vaginal 3/5, 10 quick flicks, 10 second hold   Internal Anal Sphincter   External Anal Sphincter   Puborectalis   Diastasis Recti   (Blank rows = not tested)        TONE: Within normal limits - patient tends to hold onto pelvic tension once  it is created. Has a difficult time relaxing.   PROLAPSE: N/A   TODAY'S TREATMENT:                                                                                                                              DATE:   08/09/23:  Neuro re-ed:  Hooklying diaphragmatic breathing + pelvic floor lengthening/shortening with inhalation/exhalation 2x10  Hooklying pelvic floor quick flicks + diaphragmatic breathing 2x15 Manual therapy  Internal vaginal pelvic floor muscle release to decrease overall tone and muscle tension - primarily deep bilateral obturator internus musculature and puborectalis. External adductor/quadricep insertion soft tissue mobilization to decrease increased tone/muscle tension at pelvis   08/17/23:  Neuro re-ed:  Hooklying diaphragmatic breathing +  pelvic floor lengthening/shortening with inhalation/exhalation 2x10  Hooklying pelvic floor quick flicks + diaphragmatic breathing 2x15 Manual therapy  Internal vaginal pelvic floor muscle release to decrease overall tone and muscle tension - primarily deep bilateral obturator internus musculature and puborectalis. External adductor/quadricep insertion soft tissue mobilization to decrease increased tone/muscle tension at pelvis  Abdominal STM focused on left lower quadrant to decrease abdominal tone   08/30/23:  Self care  Pelvic floor wand education for utilization at home to release pelvic floor tension independently  Water based lubricant recommendation and samples provided to assist with home use of wand  Neuro re-ed:  Hooklying diaphragmatic breathing + pelvic floor lengthening with inhalation 2x10  Inhalation focused on increasing lower rib excursion to decrease abdominal tone  Manual therapy  Internal vaginal pelvic floor muscle release to decrease overall tone and muscle tension - primarily deep bilateral obturator internus musculature and puborectalis. External adductor/quadricep insertion soft tissue mobilization to decrease increased tone/muscle tension at pelvis  Abdominal STM focused on left lower quadrant and bilateral inferior ribs to decrease abdominal tone   PATIENT EDUCATION:  Education details: Pelvic floor relative anatomy, connection between the diaphragm and pelvic floor, intraabdominal pressure management with the pelvic floor, pelvic floor active range of motion education and how breathing affects this.  Person educated: Patient Education method: Explanation, Demonstration, Tactile cues, Verbal cues, and Handouts Education comprehension: verbalized understanding, returned demonstration, verbal cues required, tactile cues required, and needs further education  HOME EXERCISE PROGRAM: Access Code: [email or web address] URL: [email or web address] Date:  07/07/2023 Prepared by: Robbin Chill  Exercises - Supine Pelvic Floor Contraction  - 1 x daily - 7 x weekly - 2 sets - 10 reps - Quick Flick Pelvic Floor Contractions in Hooklying  - 1 x daily - 7 x weekly - 2 sets - 10 reps  ASSESSMENT:  CLINICAL IMPRESSION: Patient is a 48 y.o. female  who was seen today for physical therapy treatment for stress urinary incontinence. Patient's urge incontinence has not been present, only stress incontinence is present. Patient reports her biggest limiting factor today is the tone in her abdomen. Manual soft tissue mobilization performed on abdomen focusing on inferior rib excursion with inhalation and bilateral lower quadrant mobilization. Internal manual  therapy performed to decrease overall pelvic floor tension secondary to increased muscle tone in the abdominal wall. Soft tissue mobilization performed externally to left sided adductor insertion to decrease tension. Patient provided water based lubricant samples and education surrounding pelvic floor wand utilization at home. Patient had no pain or leakage following today's session. Pt would benefit from additional PT to further address deficits.    OBJECTIVE IMPAIRMENTS: decreased coordination, decreased endurance, decreased mobility, decreased ROM, and decreased strength.   ACTIVITY LIMITATIONS: continence  PARTICIPATION LIMITATIONS:  N/A  PERSONAL FACTORS: Past/current experiences and Time since onset of injury/illness/exacerbation are also affecting patient's functional outcome.   REHAB POTENTIAL: Good  CLINICAL DECISION MAKING: Stable/uncomplicated  EVALUATION COMPLEXITY: Low   GOALS: Goals reviewed with patient? Yes  SHORT TERM GOALS: Target date: 08/04/2023  Pt will be independent with HEP.  Baseline: Goal status: INITIAL  2.  Pt will be independent with diaphragmatic breathing and down training activities in order to improve pelvic floor relaxation. Baseline:  Goal status:  INITIAL  3.  Pt will be independent with the knack, urge suppression technique, and double voiding in order to improve bladder habits and decrease urinary incontinence.   Baseline:  Goal status: INITIAL  LONG TERM GOALS: Target date: 01/07/2024  Pt will be independent with advanced HEP.  Baseline:  Goal status: INITIAL  2.  Pt to demonstrate improved coordination of pelvic floor and breathing mechanics with 10# squat with appropriate synergistic patterns to decrease pain and leakage at least 75% of the time.   Baseline:  Goal status: INITIAL  3.  Pt will demonstrate normal pelvic floor muscle tone and A/ROM, able to achieve 4/5 strength with contractions and 10 sec endurance, in order to provide appropriate lumbopelvic support in functional activities.   Baseline:  Goal status: INITIAL  PLAN:  PT FREQUENCY: 1-2x/week  PT DURATION: 8 weeks  PLANNED INTERVENTIONS: 97110-Therapeutic exercises, 97530- Therapeutic activity, 97112- Neuromuscular re-education, 97535- Self Care, [id number]- Manual therapy, Taping, Dry Needling, Joint mobilization, Spinal mobilization, Scar mobilization, Cryotherapy, and Moist heat  PLAN FOR NEXT SESSION: internal to decrease pelvic floor tension at rest, WAND discussion, introduce gentle core and hip strengthening, dry needle adductors/quads/glutes  Marni Sins, PT 08/30/2023, 8:21 AM

## 2023-09-06 ENCOUNTER — Ambulatory Visit: Admitting: Physical Therapy

## 2023-09-06 DIAGNOSIS — R279 Unspecified lack of coordination: Secondary | ICD-10-CM | POA: Diagnosis not present

## 2023-09-06 DIAGNOSIS — R293 Abnormal posture: Secondary | ICD-10-CM

## 2023-09-06 DIAGNOSIS — M6281 Muscle weakness (generalized): Secondary | ICD-10-CM

## 2023-09-06 NOTE — Therapy (Signed)
 OUTPATIENT PHYSICAL THERAPY FEMALE PELVIC TREATMENT   Patient Name: Shelley Ryan MRN: [id number] DOB:18-Dec-1975, 48 y.o., female Today's Date: 09/06/2023  END OF SESSION:  PT End of Session - 09/06/23 0757     Visit Number 6    Number of Visits 8    Authorization Type [employer]    PT Start Time 0730    PT Stop Time 0800    PT Time Calculation (min) 30 min    Activity Tolerance Patient tolerated treatment well    Behavior During Therapy [facility] for tasks assessed/performed                  Past Medical History:  Diagnosis Date   Anemia    Bilateral swelling of feet    BRCA1 negative 10/08/2013   CVA (cerebral infarction) 2002   Most likely from Hormonal Tx   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   Gallbladder problem    Gastric outlet obstruction    Gastroparesis    H/O blood clots    Heartburn    Hiatal hernia    Hyperlipidemia    Hypertension    IIH (idiopathic intracranial hypertension)    Inflammatory arthritis    Joint pain    Low vitamin D  level    2013  17   OSA (obstructive sleep apnea)    PCOS (polycystic ovarian syndrome)    stroke on OCP hormonal therapy   Prediabetes    Pylorospasm    Stroke (cerebrum) (HCC)    Vitamin D  deficiency    Past Surgical History:  Procedure Laterality Date   APPENDECTOMY     BREAST BIOPSY     CHOLECYSTECTOMY N/A 10/09/2013   Procedure: LAPAROSCOPIC CHOLECYSTECTOMY WITH INTRAOPERATIVE CHOLANGIOGRAM;  Surgeon: Azucena Bollard, MD;  Location: [facility];  Service: General;  Laterality: N/A;   COLON SURGERY     KNEE ARTHROSCOPY Bilateral    PYLOROPLASTY     RIGHT OOPHORECTOMY     SALPINGECTOMY Bilateral    TYMPANOSTOMY TUBE PLACEMENT     x 2   WISDOM TOOTH EXTRACTION     Patient Active Problem List   Diagnosis Date Noted   Generalized obesity 06/14/2022   Myalgia 02/22/2022   Bilateral lower extremity edema 10/07/2021   Bilateral pendulous breasts  09/09/2021   Pyloric stenosis in adult 08/10/2021   Epigastric pain 08/10/2021   Bronchitis 05/15/2020   SOB (shortness of breath) 03/04/2020   OSA (obstructive sleep apnea) 03/04/2020   Mixed hyperlipidemia 02/04/2020   Migraine with aura and without status migrainosus, not intractable 01/23/2020   Vitamin D  deficiency 10/23/2019   Prediabetes 10/09/2019   Headache 08/29/2019   Essential hypertension 08/29/2019   Esophageal hiatal hernia 06/20/2019   Stroke (HCC) 06/20/2019   Glaucoma suspect of both eyes 01/05/2019   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 01/05/2019   Myopia with astigmatism, bilateral 01/05/2019   Posterior vitreous detachment of both eyes 01/05/2019   Blepharitis of both upper and lower eyelid 01/05/2019   Family history of malignant hyperthermia 05/17/2016   GERD (gastroesophageal reflux disease) 02/25/2016   Chronic cholecystitis with calculus 10/30/2013   Acute cholecystitis 10/10/2013   Status post laparoscopic cholecystectomy June 2015 10/09/2013   Morbid obesity (HCC) 10/08/2013   PCOS (polycystic ovarian syndrome)     PCP: Perley Bradley, MD   REFERRING PROVIDER: Helane Lloyd, FNP   REFERRING DIAG: N39.3 (ICD-10-CM) - Stress incontinence of urine  M62.89 (ICD-10-CM) - Pelvic floor dysfunction  THERAPY DIAG:  Unspecified lack of coordination  Muscle weakness (generalized)  Abnormal posture  Rationale for Evaluation and Treatment: Rehabilitation  ONSET DATE: 2025  SUBJECTIVE:                                                                                                                                                                                           SUBJECTIVE STATEMENT: Patient reports that her left side is experiencing increased tone today, has a headache and usually these two symptoms correlate. Stress incontinence is happening on the second or subsequent cough or sneeze. No urge urinary incontinence to report. Patient  has the wand at home but has had headaches that have prevented her from doing her HEP consistently.  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 She was having more urge incontinence in the past, but this is no longer present (she has attended pelvic PT before for this). She reports abdominal wall cramping with any core strengthening activities.  Fluid intake: 1 caffeine drink 2x/day, 2 40 oz water bottles daily   PAIN:  Are you having pain? No NPRS scale: 0/10  PRECAUTIONS: None  RED FLAGS: None   WEIGHT BEARING RESTRICTIONS: No  FALLS:  Has patient fallen in last 6 months? No  OCCUPATION: physical therapist   ACTIVITY LEVEL : exercise classes, weightlifting   PLOF: Independent with basic ADLs  PATIENT GOALS: decrease urinary leakage   PERTINENT HISTORY:  Stroke at 52. Hypertonic on left side, has a hard time relaxing pelvic floor   Bowel: no issues to report with bowel movements or bowel habits   URINATION: Pain with urination: No Fully empty bladder: Yes:   Stream: Strong Urgency: Yes  Frequency: within normal limits  Leakage: Coughing, Sneezing, Laughing, Exercise, and Lifting Pads: No  INTERCOURSE:  Ability to have vaginal penetration Yes  Pain with intercourse: none DrynessNo Climax: yes Marinoff Scale: 0/3  PREGNANCY: Vaginal deliveries 1 Tearing Yes: 4th   PROLAPSE: None   OBJECTIVE:  Note: Objective measures were completed at Evaluation unless otherwise noted.  PATIENT SURVEYS:   PFIQ-7: 10  COGNITION: Overall cognitive status: Within functional limits for tasks assessed     SENSATION: Light touch: Appears intact  LUMBAR SPECIAL TESTS:  Single leg stance test: Positive  FUNCTIONAL TESTS:  Squat: within normal limits with slight leakage after 3 squats   GAIT: Assistive device utilized: None Comments: mild trendelenburg gait  pattern with ambulation   POSTURE: rounded shoulders, forward head, and increased thoracic kyphosis   LUMBARAROM/PROM: within functional limits  A/PROM A/PROM  eval  Flexion   Extension   Right lateral flexion   Left lateral flexion   Right rotation   Left rotation    (Blank rows = not tested)  LOWER EXTREMITY ROM: within functional limits   Active ROM Right eval Left eval  Hip flexion    Hip extension    Hip abduction    Hip adduction    Hip internal rotation    Hip external rotation    Knee flexion    Knee extension    Ankle dorsiflexion    Ankle plantarflexion    Ankle inversion    Ankle eversion     (Blank rows = not tested)  LOWER EXTREMITY MMT: 4/5 bilateral hips and knees grossly  MMT Right eval Left eval  Hip flexion    Hip extension    Hip abduction    Hip adduction    Hip internal rotation    Hip external rotation    Knee flexion    Knee extension    Ankle dorsiflexion    Ankle plantarflexion    Ankle inversion    Ankle eversion     (Blank rows = not tested) PALPATION:   General: adductor and hip flexor overactivity left>right   Pelvic Alignment: within normal limits   Abdominal: upper chest breathing, abdominal bracing at rest, decreased lower rib excursion                 External Perineal Exam: minimal dryness noted                              Internal Pelvic Floor: general weakness noted throughout, no pain with palpation of superficial or deep pelvic floor musculature, general lack of coordination noted with inhalation/exhalation   Patient confirms identification and approves PT to assess internal pelvic floor and treatment Yes No emotional/communication barriers or cognitive limitation. Patient is motivated to learn. Patient understands and agrees with treatment goals and plan. PT explains patient will be examined in standing, sitting, and lying down to see how their muscles and joints work. When they are ready, they will be asked to  remove their underwear so PT can examine their perineum. The patient is also given the option of providing their own chaperone as one is not provided in our facility. The patient also has the right and is explained the right to defer or refuse any part of the evaluation or treatment including the internal exam. With the patient's consent, PT will use one gloved finger to gently assess the muscles of the pelvic floor, seeing how well it contracts and relaxes and if there is muscle symmetry. After, the patient will get dressed and PT and patient will discuss exam findings and plan of care. PT and patient discuss plan of care, schedule, attendance policy and HEP activities.  PELVIC MMT:   MMT eval  Vaginal 3/5, 10 quick flicks, 10 second hold   Internal Anal Sphincter   External Anal Sphincter   Puborectalis   Diastasis Recti   (Blank rows = not tested)        TONE: Within normal limits - patient tends to hold onto pelvic tension once it is created. Has a difficult time relaxing.   PROLAPSE: N/A   TODAY'S TREATMENT:  DATE:   08/09/23:  Neuro re-ed:  Hooklying diaphragmatic breathing + pelvic floor lengthening/shortening with inhalation/exhalation 2x10  Hooklying pelvic floor quick flicks + diaphragmatic breathing 2x15 Manual therapy  Internal vaginal pelvic floor muscle release to decrease overall tone and muscle tension - primarily deep bilateral obturator internus musculature and puborectalis. External adductor/quadricep insertion soft tissue mobilization to decrease increased tone/muscle tension at pelvis   08/17/23:  Neuro re-ed:  Hooklying diaphragmatic breathing + pelvic floor lengthening/shortening with inhalation/exhalation 2x10  Hooklying pelvic floor quick flicks + diaphragmatic breathing 2x15 Manual therapy  Internal vaginal pelvic floor muscle release to  decrease overall tone and muscle tension - primarily deep bilateral obturator internus musculature and puborectalis. External adductor/quadricep insertion soft tissue mobilization to decrease increased tone/muscle tension at pelvis  Abdominal STM focused on left lower quadrant to decrease abdominal tone   08/30/23:  Self care  Pelvic floor wand education for utilization at home to release pelvic floor tension independently  Water based lubricant recommendation and samples provided to assist with home use of wand  Neuro re-ed:  Hooklying diaphragmatic breathing + pelvic floor lengthening with inhalation 2x10  Inhalation focused on increasing lower rib excursion to decrease abdominal tone  Manual therapy  Internal vaginal pelvic floor muscle release to decrease overall tone and muscle tension - primarily deep bilateral obturator internus musculature and puborectalis. External adductor/quadricep insertion soft tissue mobilization to decrease increased tone/muscle tension at pelvis  Abdominal STM focused on left lower quadrant and bilateral inferior ribs to decrease abdominal tone   09/06/23:  Self care  Pelvic floor wand education for utilization at home to release pelvic floor tension independently  Water based lubricant recommendation and samples provided to assist with home use of wand  Neuro re-ed:  Hooklying diaphragmatic breathing + pelvic floor lengthening with inhalation 2x10  Inhalation focused on increasing lower rib excursion to decrease abdominal tone  Manual therapy . External adductor/quadricep insertion soft tissue mobilization to decrease increased tone/muscle tension at pelvis  Abdominal STM focused on left lower quadrant and bilateral inferior ribs to decrease abdominal tone   PATIENT EDUCATION:  Education details: Pelvic floor relative anatomy, connection between the diaphragm and pelvic floor, intraabdominal pressure management with the pelvic floor, pelvic floor active  range of motion education and how breathing affects this.  Person educated: Patient Education method: Explanation, Demonstration, Tactile cues, Verbal cues, and Handouts Education comprehension: verbalized understanding, returned demonstration, verbal cues required, tactile cues required, and needs further education  HOME EXERCISE PROGRAM: Access Code: [email or web address] URL: [email or web address] Date: 07/07/2023 Prepared by: Robbin Chill  Exercises - Supine Pelvic Floor Contraction  - 1 x daily - 7 x weekly - 2 sets - 10 reps - Quick Flick Pelvic Floor Contractions in Hooklying  - 1 x daily - 7 x weekly - 2 sets - 10 reps  ASSESSMENT:  CLINICAL IMPRESSION: Patient is a 48 y.o. female  who was seen today for physical therapy treatment for stress urinary incontinence. Patient's urge incontinence has not been present, only stress incontinence is present, and this is now happening on subsequent coughs/sneezes rather than initial instances. Manual soft tissue mobilization performed on abdominals, lower ribs, and left hip flexors/external hip musculature focusing on inferior rib excursion with inhalation and bilateral lower quadrant mobilization. Patient demonstrates increased left sided abdominal tone compared to right, and there were palpable trigger points in her left hip flexors. Following manual interventions, patient had less abdominal tone and decreased abdominal tension with lumbar extension. Patient had no pain or  leakage following today's session. Pt would benefit from additional PT to further address deficits.    OBJECTIVE IMPAIRMENTS: decreased coordination, decreased endurance, decreased mobility, decreased ROM, and decreased strength.   ACTIVITY LIMITATIONS: continence  PARTICIPATION LIMITATIONS: N/A  PERSONAL FACTORS: Past/current experiences and Time since onset of injury/illness/exacerbation are also affecting patient's functional outcome.   REHAB POTENTIAL:  Good  CLINICAL DECISION MAKING: Stable/uncomplicated  EVALUATION COMPLEXITY: Low   GOALS: Goals reviewed with patient? Yes  SHORT TERM GOALS: Target date: 08/04/2023  Pt will be independent with HEP.  Baseline: Goal status: INITIAL  2.  Pt will be independent with diaphragmatic breathing and down training activities in order to improve pelvic floor relaxation. Baseline:  Goal status: INITIAL  3.  Pt will be independent with the knack, urge suppression technique, and double voiding in order to improve bladder habits and decrease urinary incontinence.   Baseline:  Goal status: INITIAL  LONG TERM GOALS: Target date: 01/07/2024  Pt will be independent with advanced HEP.  Baseline:  Goal status: INITIAL  2.  Pt to demonstrate improved coordination of pelvic floor and breathing mechanics with 10# squat with appropriate synergistic patterns to decrease pain and leakage at least 75% of the time.   Baseline:  Goal status: INITIAL  3.  Pt will demonstrate normal pelvic floor muscle tone and A/ROM, able to achieve 4/5 strength with contractions and 10 sec endurance, in order to provide appropriate lumbopelvic support in functional activities.   Baseline:  Goal status: INITIAL  PLAN:  PT FREQUENCY: 1-2x/week  PT DURATION: 8 weeks  PLANNED INTERVENTIONS: 97110-Therapeutic exercises, 97530- Therapeutic activity, 97112- Neuromuscular re-education, 97535- Self Care, 29528- Manual therapy, Taping, Dry Needling, Joint mobilization, Spinal mobilization, Scar mobilization, Cryotherapy, and Moist heat  PLAN FOR NEXT SESSION: internal to decrease pelvic floor tension at rest, WAND discussion, introduce gentle core and hip strengthening, dry needle adductors/quads/glutes  Marni Sins, PT 09/06/2023, 7:57 AM

## 2023-09-07 ENCOUNTER — Ambulatory Visit: Admitting: Nurse Practitioner

## 2023-09-07 ENCOUNTER — Encounter: Payer: Self-pay | Admitting: Nurse Practitioner

## 2023-09-07 VITALS — BP 119/69 | HR 93 | Temp 98.2°F | Ht 67.0 in | Wt 224.0 lb

## 2023-09-07 DIAGNOSIS — E66812 Obesity, class 2: Secondary | ICD-10-CM

## 2023-09-07 DIAGNOSIS — Z6835 Body mass index (BMI) 35.0-35.9, adult: Secondary | ICD-10-CM

## 2023-09-07 DIAGNOSIS — R632 Polyphagia: Secondary | ICD-10-CM | POA: Diagnosis not present

## 2023-09-07 DIAGNOSIS — E559 Vitamin D deficiency, unspecified: Secondary | ICD-10-CM

## 2023-09-07 DIAGNOSIS — R7303 Prediabetes: Secondary | ICD-10-CM

## 2023-09-07 DIAGNOSIS — I10 Essential (primary) hypertension: Secondary | ICD-10-CM | POA: Diagnosis not present

## 2023-09-07 MED ORDER — LISINOPRIL 5 MG PO TABS: 5.0000 mg | ORAL_TABLET | Freq: Every day | ORAL | 0 refills | Status: DC

## 2023-09-07 MED ORDER — VITAMIN D (ERGOCALCIFEROL) 1.25 MG (50000 UNIT) PO CAPS: 50000.0000 [IU] | ORAL_CAPSULE | ORAL | 0 refills | Status: DC

## 2023-09-07 MED ORDER — TIRZEPATIDE-WEIGHT MANAGEMENT 2.5 MG/0.5ML ~~LOC~~ SOLN: 2.5000 mg | SUBCUTANEOUS | 0 refills | Status: DC

## 2023-09-07 MED ORDER — METFORMIN HCL 500 MG PO TABS: ORAL_TABLET | ORAL | 0 refills | Status: DC

## 2023-09-07 NOTE — Progress Notes (Signed)
 Office: [phone]  /  Fax: [phone]  WEIGHT SUMMARY AND BIOMETRICS  Weight Lost Since Last Visit: 7lb  Weight Gained Since Last Visit: 0lb   Vitals Temp: 98.2 F (36.8 C) BP: 119/69 Pulse Rate: 93 SpO2: 97 %   Anthropometric Measurements Height: 5\' 7"  (1.702 m) Weight: 224 lb (101.6 kg) BMI (Calculated): 35.08 Weight at Last Visit: 231lb Weight Lost Since Last Visit: 7lb Weight Gained Since Last Visit: 0lb Starting Weight: 242lb Total Weight Loss (lbs): 18 lb (8.165 kg)   Body Composition  Body Fat %: 40.2 % Fat Mass (lbs): 90.2 lbs Muscle Mass (lbs): 127.2 lbs Total Body Water (lbs): 82 lbs Visceral Fat Rating : 10   Other Clinical Data Fasting: Yes Labs: No Today's Visit #: 72 Starting Date: 07/02/19     HPI  Chief Complaint: OBESITY  Shelley Ryan is here to discuss her progress with her obesity treatment plan. She is on the the Category 2 Plan and states she is following her eating plan approximately 90 % of the time. She states she is exercising 45 minutes 2 days per week.   Interval History:  Since last office visit she has lost 7 pounds.  She is averaging around 1500 calories and 120 grams of protein daily. She is drinking water, liquid IV and protein daily.   BF:  protein shake with protein coffee (collagen) Snack:  none Lunch:  protein and vegetable  Snack:  fruit Dinner: protein and vegetable  Pharmacotherapy for weight loss: She is currently taking Zepbound 2.5mg  for medical weight loss. Notes some reflux initially but that has improved.    Previous pharmacotherapy for medical weight loss:   Wegovy  (denied side effects), Phentermine, Contrave  (didn't feel it was beneficial) and Saxenda -didn't have worsening of GERD with taking GLP-1s She stopped Phentermine due to not being effective.    Bariatric surgery:   Not able to proceed with bariatric surgery at this time.    Hypertension Hypertension stable.  Medication(s): Lisinopril  5mg .  Denies side effects.   Denies chest pain, palpitations and SOB.  BP Readings from Last 3 Encounters:  09/07/23 119/69  08/08/23 130/72  07/04/23 112/70   Lab Results  Component Value Date   CREATININE 0.93 10/07/2022   CREATININE 0.92 05/24/2022   CREATININE 0.85 02/18/2022    Prediabetes Last A1c was 5.9 on 07/27/23  Medication(s): Metformin  500mg  BID.  Denies side effects.  Polyphagia:Yes Lab Results  Component Value Date   HGBA1C 5.9 (H) 10/07/2022   HGBA1C 5.9 (H) 05/24/2022   HGBA1C 6.0 (H) 02/18/2022   HGBA1C 6.0 (H) 11/02/2021   HGBA1C 5.7 (H) 06/25/2021   Lab Results  Component Value Date   INSULIN  18.0 10/07/2022   INSULIN  13.4 05/24/2022   INSULIN  15.8 02/18/2022   INSULIN  9.4 11/02/2021   INSULIN  18.2 06/25/2021   Vit D deficiency  She is taking Vit D 50,000 IU weekly.  Denies side effects.  Denies nausea, vomiting or muscle weakness.    Lab Results  Component Value Date   VD25OH 49.1 10/07/2022   VD25OH 46.3 02/18/2022   VD25OH 48.0 11/02/2021    PHYSICAL EXAM:  Blood pressure 119/69, pulse 93, temperature 98.2 F (36.8 C), height 5\' 7"  (1.702 m), weight 224 lb (101.6 kg), SpO2 97%. Body mass index is 35.08 kg/m.  General: She is overweight, cooperative, alert, well developed, and in no acute distress. PSYCH: Has normal mood, affect and thought process.   Extremities: No edema.  Neurologic: No gross sensory or motor deficits.  No tremors or fasciculations noted.    DIAGNOSTIC DATA REVIEWED:  BMET    Component Value Date/Time   NA 141 10/07/2022 0805   K 4.7 10/07/2022 0805   CL 108 (H) 10/07/2022 0805   CO2 18 (L) 10/07/2022 0805   GLUCOSE 108 (H) 10/07/2022 0805   GLUCOSE 96 08/29/2019 0549   BUN 16 10/07/2022 0805   CREATININE 0.93 10/07/2022 0805   CALCIUM  9.3 10/07/2022 0805   GFRNONAA 84 05/27/2020 0938   GFRAA 96 05/27/2020 0938   Lab Results  Component Value Date   HGBA1C 5.9 (H) 10/07/2022   HGBA1C 6.1 (H) 06/19/2019   Lab  Results  Component Value Date   INSULIN  18.0 10/07/2022   INSULIN  13.7 07/02/2019   Lab Results  Component Value Date   TSH 3.060 07/02/2019   CBC    Component Value Date/Time   WBC 9.1 10/07/2022 0805   WBC 12.1 (H) 08/29/2019 0549   RBC 4.69 10/07/2022 0805   RBC 4.54 08/29/2019 0549   HGB 14.3 10/07/2022 0805   HCT 42.0 10/07/2022 0805   PLT 247 10/07/2022 0805   MCV 90 10/07/2022 0805   MCH 30.5 10/07/2022 0805   MCH 30.0 08/29/2019 0549   MCHC 34.0 10/07/2022 0805   MCHC 32.9 08/29/2019 0549   RDW 12.6 10/07/2022 0805   Iron Studies No results found for: "IRON", "TIBC", "FERRITIN", "IRONPCTSAT" Lipid Panel     Component Value Date/Time   CHOL 201 (H) 10/07/2022 0805   TRIG 103 10/07/2022 0805   HDL 51 10/07/2022 0805   CHOLHDL 4.0 02/18/2022 1132   LDLCALC 131 (H) 10/07/2022 0805   Hepatic Function Panel     Component Value Date/Time   PROT 7.0 10/07/2022 0805   ALBUMIN 4.4 10/07/2022 0805   AST 17 10/07/2022 0805   ALT 16 10/07/2022 0805   ALKPHOS 71 10/07/2022 0805   BILITOT 0.3 10/07/2022 0805      Component Value Date/Time   TSH 3.060 07/02/2019 1158   Nutritional Lab Results  Component Value Date   VD25OH 49.1 10/07/2022   VD25OH 46.3 02/18/2022   VD25OH 48.0 11/02/2021     ASSESSMENT AND PLAN  TREATMENT PLAN FOR OBESITY:  Recommended Dietary Goals  Shelley Ryan is currently in the action stage of change. As such, her goal is to continue weight management plan. She has agreed to the Category 2 Plan.  Behavioral Intervention  We discussed the following Behavioral Modification Strategies today: increasing lean protein intake to established goals, decreasing simple carbohydrates , increasing vegetables, increasing fiber rich foods, increasing water intake , reading food labels , keeping healthy foods at home, and continue to work on maintaining a reduced calorie state, getting the recommended amount of protein, incorporating whole foods, making  healthy choices, staying well hydrated and practicing mindfulness when eating..  Additional resources provided today: NA  Recommended Physical Activity Goals  Shelley Ryan has been advised to work up to 150 minutes of moderate intensity aerobic activity a week and strengthening exercises 2-3 times per week for cardiovascular health, weight loss maintenance and preservation of muscle mass.   She has agreed to Think about enjoyable ways to increase daily physical activity and overcoming barriers to exercise, Increase physical activity in their day and reduce sedentary time (increase NEAT)., Increase the intensity, frequency or duration of strengthening exercises , and Increase the intensity, frequency or duration of aerobic exercises     Pharmacotherapy We discussed various medication options to help Shelley Ryan with her weight loss  efforts and we both agreed to continue Zepbound 2.5mg .  Side effects discussed.  Avoid Qsymia due to taking Zonegran.   ASSOCIATED CONDITIONS ADDRESSED TODAY  Action/Plan  Vitamin D  deficiency -     Vitamin D  (Ergocalciferol ); Take 1 capsule (50,000 Units total) by mouth every 7 (seven) days.  Dispense: 12 capsule; Refill: 0  Low Vitamin D  level contributes to fatigue and are associated with obesity, breast, and colon cancer. She agrees to continue to take prescription Vitamin D  @50 ,000 IU every week and will follow-up for routine testing of Vitamin D , at least 2-3 times per year to avoid over-replacement.   Prediabetes -     metFORMIN  HCl; TAKE 1 TABLET BY MOUTH TWICE DAILY  Dispense: 180 tablet; Refill: 0  Shelley Ryan will continue to work on weight loss, exercise, and decreasing simple carbohydrates to help decrease the risk of diabetes.    Essential hypertension -     Lisinopril ; Take 1 tablet (5 mg total) by mouth daily.  Dispense: 90 tablet; Refill: 0  Polyphagia -     Tirzepatide-Weight Management; Inject 2.5 mg into the skin once a week.  Dispense: 2 mL; Refill:  0  Class 2 severe obesity due to excess calories with serious comorbidity and body mass index (BMI) of 35.0 to 35.9 in adult [facility]) -     Tirzepatide-Weight Management; Inject 2.5 mg into the skin once a week.  Dispense: 2 mL; Refill: 0  Patient had labs at [facility] office in April.  To send to me via mychart to review   Per patient report: TSH 1.58 CMP wnl  A1c 5.9 Vit D 41  Return in about 4 weeks (around 10/05/2023).Aaron Aas She was informed of the importance of frequent follow up visits to maximize her success with intensive lifestyle modifications for her multiple health conditions.   ATTESTASTION STATEMENTS:  Reviewed by clinician on day of visit: allergies, medications, problem list, medical history, surgical history, family history, social history, and previous encounter notes.     Crist Dominion. Steffi Noviello FNP-C

## 2023-09-13 ENCOUNTER — Ambulatory Visit: Admitting: Physical Therapy

## 2023-09-15 ENCOUNTER — Other Ambulatory Visit: Payer: Self-pay | Admitting: Nurse Practitioner

## 2023-09-15 DIAGNOSIS — I10 Essential (primary) hypertension: Secondary | ICD-10-CM

## 2023-09-15 DIAGNOSIS — R7303 Prediabetes: Secondary | ICD-10-CM

## 2023-09-23 ENCOUNTER — Other Ambulatory Visit: Payer: Self-pay | Admitting: Nurse Practitioner

## 2023-09-23 DIAGNOSIS — R632 Polyphagia: Secondary | ICD-10-CM

## 2023-09-23 DIAGNOSIS — E66812 Obesity, class 2: Secondary | ICD-10-CM

## 2023-09-29 ENCOUNTER — Ambulatory Visit: Attending: Nurse Practitioner | Admitting: Physical Therapy

## 2023-09-29 DIAGNOSIS — R279 Unspecified lack of coordination: Secondary | ICD-10-CM | POA: Insufficient documentation

## 2023-09-29 NOTE — Therapy (Signed)
 OUTPATIENT PHYSICAL THERAPY FEMALE PELVIC TREATMENT   Patient Name: Shelley Ryan MRN: [id number] DOB:[birth year], 48 y.o., female Today's Date: 09/29/2023  END OF SESSION:  PT End of Session - 09/29/23 0759     Visit Number 7    Number of Visits 8    Authorization Type [facility]    PT Start Time [phone]    PT Stop Time 0800    PT Time Calculation (min) 22 min    Activity Tolerance Patient tolerated treatment well    Behavior During Therapy [facility] for tasks assessed/performed                   Past Medical History:  Diagnosis Date   Anemia    Bilateral swelling of feet    BRCA1 negative 10/08/2013   CVA (cerebral infarction) 2002   Most likely from Hormonal Tx   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   Gallbladder problem    Gastric outlet obstruction    Gastroparesis    H/O blood clots    Heartburn    Hiatal hernia    Hyperlipidemia    Hypertension    IIH (idiopathic intracranial hypertension)    Inflammatory arthritis    Joint pain    Low vitamin D  level    2013  17   OSA (obstructive sleep apnea)    PCOS (polycystic ovarian syndrome)    stroke on OCP hormonal therapy   Prediabetes    Pylorospasm    Stroke (cerebrum) (HCC)    Vitamin D  deficiency    Past Surgical History:  Procedure Laterality Date   APPENDECTOMY     BREAST BIOPSY     CHOLECYSTECTOMY N/A 10/09/2013   Procedure: LAPAROSCOPIC CHOLECYSTECTOMY WITH INTRAOPERATIVE CHOLANGIOGRAM;  Surgeon: Azucena Bollard, MD;  Location: [facility];  Service: General;  Laterality: N/A;   COLON SURGERY     KNEE ARTHROSCOPY Bilateral    PYLOROPLASTY     RIGHT OOPHORECTOMY     SALPINGECTOMY Bilateral    TYMPANOSTOMY TUBE PLACEMENT     x 2   WISDOM TOOTH EXTRACTION     Patient Active Problem List   Diagnosis Date Noted   Generalized obesity 06/14/2022   Myalgia 02/22/2022   Bilateral lower extremity edema 10/07/2021   Bilateral pendulous breasts  09/09/2021   Pyloric stenosis in adult 08/10/2021   Epigastric pain 08/10/2021   Bronchitis 05/15/2020   SOB (shortness of breath) 03/04/2020   OSA (obstructive sleep apnea) 03/04/2020   Mixed hyperlipidemia 02/04/2020   Migraine with aura and without status migrainosus, not intractable 01/23/2020   Vitamin D  deficiency 10/23/2019   Prediabetes 10/09/2019   Headache 08/29/2019   Essential hypertension 08/29/2019   Esophageal hiatal hernia 06/20/2019   Stroke (HCC) 06/20/2019   Glaucoma suspect of both eyes 01/05/2019   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 01/05/2019   Myopia with astigmatism, bilateral 01/05/2019   Posterior vitreous detachment of both eyes 01/05/2019   Blepharitis of both upper and lower eyelid 01/05/2019   Family history of malignant hyperthermia 05/17/2016   GERD (gastroesophageal reflux disease) 02/25/2016   Chronic cholecystitis with calculus 10/30/2013   Acute cholecystitis 10/10/2013   Status post laparoscopic cholecystectomy June 2015 10/09/2013   Morbid obesity (HCC) 10/08/2013   PCOS (polycystic ovarian syndrome)     PCP: Perley Bradley, MD   REFERRING PROVIDER: Helane Lloyd, FNP   REFERRING DIAG: N39.3 (ICD-10-CM) - Stress incontinence of  urine M62.89 (ICD-10-CM) - Pelvic floor dysfunction  THERAPY DIAG:  Unspecified lack of coordination  Rationale for Evaluation and Treatment: Rehabilitation  ONSET DATE: 2025  SUBJECTIVE:                                                                                                                                                                                           SUBJECTIVE STATEMENT: Patient arrived 8 minutes late to session today. She reports that she just arrived back from [place]. Her husband had a stroke before her trip which is why she had to cancel her last session. Any time she exercises (walking) she is getting a lot of leg spasticity in the left side. Her pelvic floor is feeling  okay today. She hasn't been sneezing as much recently so hasn't noticed much stress incontinence. Urge incontinence is no longer present. No bowel issues. Patient has the wand at home but has had headaches that have prevented her from doing her HEP consistently.   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 She was having more urge incontinence in the past, but this is no longer present (she has attended pelvic PT before for this). She reports abdominal wall cramping with any core strengthening activities.  Fluid intake: 1 caffeine drink 2x/day, 2 40 oz water bottles daily   PAIN:  Are you having pain? No NPRS scale: 0/10  PRECAUTIONS: None  RED FLAGS: None   WEIGHT BEARING RESTRICTIONS: No  FALLS:  Has patient fallen in last 6 months? No  OCCUPATION: physical therapist   ACTIVITY LEVEL : exercise classes, weightlifting   PLOF: Independent with basic ADLs  PATIENT GOALS: decrease urinary leakage   PERTINENT HISTORY:  Stroke at 1. Hypertonic on left side, has a hard time relaxing pelvic floor   Bowel: no issues to report with bowel movements or bowel habits   URINATION: Pain with urination: No Fully empty bladder: Yes:   Stream: Strong Urgency: Yes  Frequency: within normal limits  Leakage: Coughing, Sneezing, Laughing, Exercise, and Lifting Pads: No  INTERCOURSE:  Ability to have vaginal penetration Yes  Pain with intercourse: none DrynessNo Climax: yes Marinoff Scale: 0/3  PREGNANCY: Vaginal deliveries 1 Tearing Yes: 4th   PROLAPSE: None   OBJECTIVE:  Note: Objective measures were completed at Evaluation unless otherwise noted.  PATIENT SURVEYS:   PFIQ-7: 10  COGNITION: Overall cognitive status: Within functional limits for tasks assessed     SENSATION: Light touch: Appears intact  LUMBAR SPECIAL TESTS:  Single leg stance test:  Positive  FUNCTIONAL TESTS:  Squat: within normal  limits with slight leakage after 3 squats   GAIT: Assistive device utilized: None Comments: mild trendelenburg gait pattern with ambulation   POSTURE: rounded shoulders, forward head, and increased thoracic kyphosis   LUMBARAROM/PROM: within functional limits   A/PROM A/PROM  eval  Flexion   Extension   Right lateral flexion   Left lateral flexion   Right rotation   Left rotation    (Blank rows = not tested)  LOWER EXTREMITY ROM: within functional limits   Active ROM Right eval Left eval  Hip flexion    Hip extension    Hip abduction    Hip adduction    Hip internal rotation    Hip external rotation    Knee flexion    Knee extension    Ankle dorsiflexion    Ankle plantarflexion    Ankle inversion    Ankle eversion     (Blank rows = not tested)  LOWER EXTREMITY MMT: 4/5 bilateral hips and knees grossly  MMT Right eval Left eval  Hip flexion    Hip extension    Hip abduction    Hip adduction    Hip internal rotation    Hip external rotation    Knee flexion    Knee extension    Ankle dorsiflexion    Ankle plantarflexion    Ankle inversion    Ankle eversion     (Blank rows = not tested) PALPATION:   General: adductor and hip flexor overactivity left>right   Pelvic Alignment: within normal limits   Abdominal: upper chest breathing, abdominal bracing at rest, decreased lower rib excursion                 External Perineal Exam: minimal dryness noted                              Internal Pelvic Floor: general weakness noted throughout, no pain with palpation of superficial or deep pelvic floor musculature, general lack of coordination noted with inhalation/exhalation   Patient confirms identification and approves PT to assess internal pelvic floor and treatment Yes No emotional/communication barriers or cognitive limitation. Patient is motivated to learn. Patient understands and agrees with treatment  goals and plan. PT explains patient will be examined in standing, sitting, and lying down to see how their muscles and joints work. When they are ready, they will be asked to remove their underwear so PT can examine their perineum. The patient is also given the option of providing their own chaperone as one is not provided in our facility. The patient also has the right and is explained the right to defer or refuse any part of the evaluation or treatment including the internal exam. With the patient's consent, PT will use one gloved finger to gently assess the muscles of the pelvic floor, seeing how well it contracts and relaxes and if there is muscle symmetry. After, the patient will get dressed and PT and patient will discuss exam findings and plan of care. PT and patient discuss plan of care, schedule, attendance policy and HEP activities.  PELVIC MMT:   MMT eval  Vaginal 3/5, 10 quick flicks, 10 second hold   Internal Anal Sphincter   External Anal Sphincter   Puborectalis   Diastasis Recti   (Blank rows = not tested)        TONE: Within normal limits - patient tends to hold onto pelvic tension once it is created. Has a  difficult time relaxing.   PROLAPSE: N/A   TODAY'S TREATMENT:                                                                                                                              DATE:   08/09/23:  Neuro re-ed:  Hooklying diaphragmatic breathing + pelvic floor lengthening/shortening with inhalation/exhalation 2x10  Hooklying pelvic floor quick flicks + diaphragmatic breathing 2x15 Manual therapy  Internal vaginal pelvic floor muscle release to decrease overall tone and muscle tension - primarily deep bilateral obturator internus musculature and puborectalis. External adductor/quadricep insertion soft tissue mobilization to decrease increased tone/muscle tension at pelvis   08/17/23:  Neuro re-ed:  Hooklying diaphragmatic breathing + pelvic floor  lengthening/shortening with inhalation/exhalation 2x10  Hooklying pelvic floor quick flicks + diaphragmatic breathing 2x15 Manual therapy  Internal vaginal pelvic floor muscle release to decrease overall tone and muscle tension - primarily deep bilateral obturator internus musculature and puborectalis. External adductor/quadricep insertion soft tissue mobilization to decrease increased tone/muscle tension at pelvis  Abdominal STM focused on left lower quadrant to decrease abdominal tone   08/30/23:  Self care  Pelvic floor wand education for utilization at home to release pelvic floor tension independently  Water based lubricant recommendation and samples provided to assist with home use of wand  Neuro re-ed:  Hooklying diaphragmatic breathing + pelvic floor lengthening with inhalation 2x10  Inhalation focused on increasing lower rib excursion to decrease abdominal tone  Manual therapy  Internal vaginal pelvic floor muscle release to decrease overall tone and muscle tension - primarily deep bilateral obturator internus musculature and puborectalis. External adductor/quadricep insertion soft tissue mobilization to decrease increased tone/muscle tension at pelvis  Abdominal STM focused on left lower quadrant and bilateral inferior ribs to decrease abdominal tone   09/06/23:  Self care  Pelvic floor wand education for utilization at home to release pelvic floor tension independently  Water based lubricant recommendation and samples provided to assist with home use of wand  Neuro re-ed:  Hooklying diaphragmatic breathing + pelvic floor lengthening with inhalation 2x10  Inhalation focused on increasing lower rib excursion to decrease abdominal tone  Manual therapy . External adductor/quadricep insertion soft tissue mobilization to decrease increased tone/muscle tension at pelvis  Abdominal STM focused on left lower quadrant and bilateral inferior ribs to decrease abdominal tone  09/29/23:  Manual  therapy . External adductor/quadricep insertion soft tissue mobilization to decrease increased tone/muscle tension at pelvis  Abdominal STM focused on left lower quadrant and bilateral inferior ribs to decrease abdominal tone  Cupping to adductor hiatus and surrounding musculature to decrease tone secondary to trip to [place]  PATIENT EDUCATION:  Education details: Pelvic floor relative anatomy, connection between the diaphragm and pelvic floor, intraabdominal pressure management with the pelvic floor, pelvic floor active range of motion education and how breathing affects this.  Person educated: Patient Education method: Explanation, Demonstration, Tactile cues, Verbal cues, and Handouts Education comprehension: verbalized understanding, returned demonstration,  verbal cues required, tactile cues required, and needs further education  HOME EXERCISE PROGRAM: Access Code: [email or web address] URL: [email or web address] Date: 07/07/2023 Prepared by: Robbin Chill  Exercises - Supine Pelvic Floor Contraction  - 1 x daily - 7 x weekly - 2 sets - 10 reps - Quick Flick Pelvic Floor Contractions in Hooklying  - 1 x daily - 7 x weekly - 2 sets - 10 reps  ASSESSMENT:  CLINICAL IMPRESSION: Patient is a 48 y.o. female  who was seen today for physical therapy treatment for stress urinary incontinence. Urge and stress incontinence has not been present recently. She is still experiencing increased tone in the LLE and pelvic floor. Manual soft tissue mobilization performed on abdominals, lower ribs, and left hip flexors/external hip musculature focusing on inferior rib excursion with inhalation and bilateral lower quadrant mobilization. Patient demonstrates increased left sided adductor  tone compared to right, and there were palpable trigger points in her left hip adductors. Following manual interventions, patient had less adductor  tone and decreased abdominal tension with lumbar extension. Patient had no  pain or leakage following today's session. Pt would benefit from additional PT to further address deficits.    OBJECTIVE IMPAIRMENTS: decreased coordination, decreased endurance, decreased mobility, decreased ROM, and decreased strength.   ACTIVITY LIMITATIONS: continence  PARTICIPATION LIMITATIONS: N/A  PERSONAL FACTORS: Past/current experiences and Time since onset of injury/illness/exacerbation are also affecting patient's functional outcome.   REHAB POTENTIAL: Good  CLINICAL DECISION MAKING: Stable/uncomplicated  EVALUATION COMPLEXITY: Low   GOALS: Goals reviewed with patient? Yes  SHORT TERM GOALS: Target date: 08/04/2023  Pt will be independent with HEP.  Baseline: Goal status: INITIAL  2.  Pt will be independent with diaphragmatic breathing and down training activities in order to improve pelvic floor relaxation. Baseline:  Goal status: INITIAL  3.  Pt will be independent with the knack, urge suppression technique, and double voiding in order to improve bladder habits and decrease urinary incontinence.   Baseline:  Goal status: INITIAL  LONG TERM GOALS: Target date: 01/07/2024  Pt will be independent with advanced HEP.  Baseline:  Goal status: INITIAL  2.  Pt to demonstrate improved coordination of pelvic floor and breathing mechanics with 10# squat with appropriate synergistic patterns to decrease pain and leakage at least 75% of the time.   Baseline:  Goal status: INITIAL  3.  Pt will demonstrate normal pelvic floor muscle tone and A/ROM, able to achieve 4/5 strength with contractions and 10 sec endurance, in order to provide appropriate lumbopelvic support in functional activities.   Baseline:  Goal status: INITIAL  PLAN:  PT FREQUENCY: 1-2x/week  PT DURATION: 8 weeks  PLANNED INTERVENTIONS: 97110-Therapeutic exercises, 97530- Therapeutic activity, 97112- Neuromuscular re-education, 97535- Self Care, [id number]- Manual therapy, Taping, Dry Needling, Joint  mobilization, Spinal mobilization, Scar mobilization, Cryotherapy, and Moist heat  PLAN FOR NEXT SESSION: internal to decrease pelvic floor tension at rest, WAND discussion, introduce gentle core and hip strengthening, dry needle adductors/quads/glutes  Marni Sins, PT 09/29/2023, 7:59 AM

## 2023-10-10 ENCOUNTER — Encounter: Payer: Self-pay | Admitting: Nurse Practitioner

## 2023-10-10 ENCOUNTER — Ambulatory Visit: Admitting: Nurse Practitioner

## 2023-10-10 VITALS — BP 102/71 | HR 85 | Temp 98.2°F | Ht 67.0 in | Wt 217.0 lb

## 2023-10-10 DIAGNOSIS — I10 Essential (primary) hypertension: Secondary | ICD-10-CM | POA: Diagnosis not present

## 2023-10-10 DIAGNOSIS — Z6833 Body mass index (BMI) 33.0-33.9, adult: Secondary | ICD-10-CM

## 2023-10-10 DIAGNOSIS — R632 Polyphagia: Secondary | ICD-10-CM

## 2023-10-10 DIAGNOSIS — E782 Mixed hyperlipidemia: Secondary | ICD-10-CM | POA: Diagnosis not present

## 2023-10-10 DIAGNOSIS — E66811 Obesity, class 1: Secondary | ICD-10-CM | POA: Diagnosis not present

## 2023-10-10 MED ORDER — TIRZEPATIDE-WEIGHT MANAGEMENT 2.5 MG/0.5ML ~~LOC~~ SOLN: 2.5000 mg | SUBCUTANEOUS | 0 refills | Status: DC

## 2023-10-10 NOTE — Progress Notes (Signed)
 Office: [phone]  /  Fax: [phone]  WEIGHT SUMMARY AND BIOMETRICS  Weight Lost Since Last Visit: 7lb  Weight Gained Since Last Visit: 0lb   Vitals Temp: 98.2 F (36.8 C) BP: 102/71 Pulse Rate: 85 SpO2: 95 %   Anthropometric Measurements Height: 5' 7 (1.702 m) Weight: 217 lb (98.4 kg) BMI (Calculated): 33.98 Weight at Last Visit: 224lb Weight Lost Since Last Visit: 7lb Weight Gained Since Last Visit: 0lb Starting Weight: 242lb Total Weight Loss (lbs): 25 lb (11.3 kg)   Body Composition  Body Fat %: 39.3 % Fat Mass (lbs): 85.4 lbs Muscle Mass (lbs): 125.4 lbs Total Body Water (lbs): 80.8 lbs Visceral Fat Rating : 10   Other Clinical Data Fasting: Yes Labs: No Today's Visit #: 73 Starting Date: 07/02/19     HPI  Chief Complaint: OBESITY  Shelley Ryan is here to discuss her progress with her obesity treatment plan. She is on the the Category 2 Plan and states she is following her eating plan approximately 90 % of the time. She states she is exercising 20 minutes 2-3 days per week.   Interval History:  Since last office visit she has lost 7 pounds. She recently returned from a trip from [place].  She is not skipping meals.  If she has to miss lunch, she will drink a protein shake and eat a protein bar.  For dinner, she is eating a protein and vegetable.  She is not tracking but is making sure she is eating around 120 grams of protein daily.  She is drinking water, protein coffee and a protein shake.  She is doing resistance training and walking to stay active.    She is going to [place] next [place]  Pharmacotherapy for weight loss: She is currently taking Zepbound  2.5mg  for medical weight loss. Notes some reflux initially but that has improved. Overall doing well.     Previous pharmacotherapy for medical weight loss:   Wegovy  (denied side effects), Phentermine, Contrave  (didn't feel it was beneficial) and Saxenda -didn't have worsening of GERD with taking  GLP-1s She stopped Phentermine due to not being effective.    Bariatric surgery:   Not able to proceed with bariatric surgery at this time.   Hypertension Hypertension well controlled.  Medication(s): Lisinopril  5mg .  Denies side effects.   Denies chest pain, palpitations and SOB. FH:  None  BP Readings from Last 3 Encounters:  10/10/23 102/71  09/07/23 119/69  08/08/23 130/72   Lab Results  Component Value Date   CREATININE 0.93 10/07/2022   CREATININE 0.92 05/24/2022   CREATININE 0.85 02/18/2022    Hyperlipidemia Medication(s): none. Denies side effects.   Cardiovascular risk factors: hypertension and obesity (BMI >= 30 kg/m2) FH:  father, mother Statin intolerant    Lab Results  Component Value Date   CHOL 201 (H) 10/07/2022   HDL 51 10/07/2022   LDLCALC 131 (H) 10/07/2022   TRIG 103 10/07/2022   CHOLHDL 4.0 02/18/2022   Lab Results  Component Value Date   ALT 16 10/07/2022   AST 17 10/07/2022   ALKPHOS 71 10/07/2022   BILITOT 0.3 10/07/2022   The ASCVD Risk score (Arnett DK, et al., 2019) failed to calculate for the following reasons:   Risk score cannot be calculated because patient has a medical history suggesting prior/existing ASCVD   PHYSICAL EXAM:  Blood pressure 102/71, pulse 85, temperature 98.2 F (36.8 C), height 5' 7 (1.702 m), weight 217 lb (98.4 kg), last menstrual period 09/21/2023, SpO2 95%.  Body mass index is 33.99 kg/m.  General: She is overweight, cooperative, alert, well developed, and in no acute distress. PSYCH: Has normal mood, affect and thought process.   Extremities: No edema.  Neurologic: No gross sensory or motor deficits. No tremors or fasciculations noted.    DIAGNOSTIC DATA REVIEWED:  BMET    Component Value Date/Time   NA 141 10/07/2022 0805   K 4.7 10/07/2022 0805   CL 108 (H) 10/07/2022 0805   CO2 18 (L) 10/07/2022 0805   GLUCOSE 108 (H) 10/07/2022 0805   GLUCOSE 96 08/29/2019 0549   BUN 16 10/07/2022 0805    CREATININE 0.93 10/07/2022 0805   CALCIUM  9.3 10/07/2022 0805   GFRNONAA 84 05/27/2020 0938   GFRAA 96 05/27/2020 0938   Lab Results  Component Value Date   HGBA1C 5.9 (H) 10/07/2022   HGBA1C 6.1 (H) 06/19/2019   Lab Results  Component Value Date   INSULIN  18.0 10/07/2022   INSULIN  13.7 07/02/2019   Lab Results  Component Value Date   TSH 3.060 07/02/2019   CBC    Component Value Date/Time   WBC 9.1 10/07/2022 0805   WBC 12.1 (H) 08/29/2019 0549   RBC 4.69 10/07/2022 0805   RBC 4.54 08/29/2019 0549   HGB 14.3 10/07/2022 0805   HCT 42.0 10/07/2022 0805   PLT 247 10/07/2022 0805   MCV 90 10/07/2022 0805   MCH 30.5 10/07/2022 0805   MCH 30.0 08/29/2019 0549   MCHC 34.0 10/07/2022 0805   MCHC 32.9 08/29/2019 0549   RDW 12.6 10/07/2022 0805   Iron Studies No results found for: IRON, TIBC, FERRITIN, IRONPCTSAT Lipid Panel     Component Value Date/Time   CHOL 201 (H) 10/07/2022 0805   TRIG 103 10/07/2022 0805   HDL 51 10/07/2022 0805   CHOLHDL 4.0 02/18/2022 1132   LDLCALC 131 (H) 10/07/2022 0805   Hepatic Function Panel     Component Value Date/Time   PROT 7.0 10/07/2022 0805   ALBUMIN 4.4 10/07/2022 0805   AST 17 10/07/2022 0805   ALT 16 10/07/2022 0805   ALKPHOS 71 10/07/2022 0805   BILITOT 0.3 10/07/2022 0805      Component Value Date/Time   TSH 3.060 07/02/2019 1158   Nutritional Lab Results  Component Value Date   VD25OH 49.1 10/07/2022   VD25OH 46.3 02/18/2022   VD25OH 48.0 11/02/2021     ASSESSMENT AND PLAN  TREATMENT PLAN FOR OBESITY:  Recommended Dietary Goals  Shelley Ryan is currently in the action stage of change. As such, her goal is to continue weight management plan. She has agreed to the Category 2 Plan-can increase to 1400 calories.    Behavioral Intervention  We discussed the following Behavioral Modification Strategies today: increasing lean protein intake to established goals, decreasing simple carbohydrates , increasing  vegetables, increasing fiber rich foods, increasing water intake , work on meal planning and preparation, and continue to work on maintaining a reduced calorie state, getting the recommended amount of protein, incorporating whole foods, making healthy choices, staying well hydrated and practicing mindfulness when eating..  Additional resources provided today: NA  Recommended Physical Activity Goals  Adrionna has been advised to work up to 150 minutes of moderate intensity aerobic activity a week and strengthening exercises 2-3 times per week for cardiovascular health, weight loss maintenance and preservation of muscle mass.   She has agreed to Think about enjoyable ways to increase daily physical activity and overcoming barriers to exercise, Increase physical activity in their  day and reduce sedentary time (increase NEAT)., Start strengthening exercises with a goal of 2-3 sessions a week , and continue to gradually increase the amount and intensity of exercise routine   Pharmacotherapy We discussed various medication options to help Haskell Linker with her weight loss efforts and we both agreed to continue Zepbound  2.5mg . Side effects discussed.  Avoid Qsymia due to taking Zonegran.   ASSOCIATED CONDITIONS ADDRESSED TODAY  Action/Plan  Essential hypertension Doing well. Continue Lisinopril . Will monitor for hypotension as she continues to lose weight.    Mixed hyperlipidemia -     Lipid Panel With LDL/HDL Ratio  Polyphagia -     Tirzepatide -Weight Management; Inject 2.5 mg into the skin once a week.  Dispense: 2 mL; Refill: 0  Class 1 obesity due to excess calories with serious comorbidity and body mass index (BMI) of 33.0 to 33.9 in adult -     Tirzepatide -Weight Management; Inject 2.5 mg into the skin once a week.  Dispense: 2 mL; Refill: 0         Return in about 4 weeks (around 11/07/2023).Aaron Aas She was informed of the importance of frequent follow up visits to maximize her success with  intensive lifestyle modifications for her multiple health conditions.   ATTESTASTION STATEMENTS:  Reviewed by clinician on day of visit: allergies, medications, problem list, medical history, surgical history, family history, social history, and previous encounter notes.     Crist Dominion. Ashland Osmer FNP-C

## 2023-10-11 LAB — LIPID PANEL WITH LDL/HDL RATIO
Cholesterol, Total: 198 mg/dL (ref 100–199)
HDL: 44 mg/dL (ref >39)
LDL Chol Calc (NIH): 129 mg/dL — ABNORMAL HIGH (ref 0–99)
LDL/HDL Ratio: 2.9 ratio (ref 0.0–3.2)
Triglycerides: 137 mg/dL (ref 0–149)
VLDL Cholesterol Cal: 25 mg/dL (ref 5–40)

## 2023-11-03 ENCOUNTER — Encounter: Payer: Self-pay | Admitting: Nurse Practitioner

## 2023-11-06 ENCOUNTER — Other Ambulatory Visit: Payer: Self-pay | Admitting: Nurse Practitioner

## 2023-11-06 DIAGNOSIS — E559 Vitamin D deficiency, unspecified: Secondary | ICD-10-CM

## 2023-11-09 ENCOUNTER — Encounter: Payer: Self-pay | Admitting: Nurse Practitioner

## 2023-11-09 ENCOUNTER — Ambulatory Visit: Admitting: Nurse Practitioner

## 2023-11-09 VITALS — BP 129/66 | HR 88 | Temp 98.2°F | Ht 67.0 in | Wt 217.0 lb

## 2023-11-09 DIAGNOSIS — R632 Polyphagia: Secondary | ICD-10-CM | POA: Diagnosis not present

## 2023-11-09 DIAGNOSIS — Z6833 Body mass index (BMI) 33.0-33.9, adult: Secondary | ICD-10-CM | POA: Diagnosis not present

## 2023-11-09 DIAGNOSIS — E66811 Obesity, class 1: Secondary | ICD-10-CM | POA: Diagnosis not present

## 2023-11-09 DIAGNOSIS — E6609 Other obesity due to excess calories: Secondary | ICD-10-CM

## 2023-11-09 DIAGNOSIS — I10 Essential (primary) hypertension: Secondary | ICD-10-CM | POA: Diagnosis not present

## 2023-11-09 MED ORDER — TIRZEPATIDE-WEIGHT MANAGEMENT 2.5 MG/0.5ML ~~LOC~~ SOLN: 2.5000 mg | SUBCUTANEOUS | 0 refills | Status: DC

## 2023-11-09 NOTE — Progress Notes (Signed)
 Office: [phone]  /  Fax: [phone]  WEIGHT SUMMARY AND BIOMETRICS  Weight Lost Since Last Visit: 0lb  Weight Gained Since Last Visit: 0lb   Vitals Temp: 98.2 F (36.8 C) BP: 129/66 Pulse Rate: 88 SpO2: 97 %   Anthropometric Measurements Height: 5' 7 (1.702 m) Weight: 217 lb (98.4 kg) BMI (Calculated): 33.98 Weight at Last Visit: 217lb Weight Lost Since Last Visit: 0lb Weight Gained Since Last Visit: 0lb Starting Weight: 242lb Total Weight Loss (lbs): 25 lb (11.3 kg)   Body Composition  Body Fat %: 30 % Fat Mass (lbs): 65.2 lbs Muscle Mass (lbs): 144.8 lbs Total Body Water (lbs): 88.2 lbs Visceral Fat Rating : 8   Other Clinical Data Fasting: Yes Labs: No Today's Visit #: 54 Starting Date: 07/02/19     HPI  Chief Complaint: OBESITY  Shelley Ryan is here to discuss her progress with her obesity treatment plan. She is on the the Category 2 Plan and states she is following her eating plan approximately 70 % of the time. She states she is exercising 20-30 minutes 3-4 days per week.   Interval History:  Since last office visit she has maintained her weight.  She has been traveling frequently for work.  She does well with breakfast.  She has to eat what is given to her at work for lunch.  She tends to eat out for dinner when traveling.  Due to frequently traveling, her goal was to maintain her weight.  Even with traveling, she tries to be mindful of what she is eating and watch her portion sizes.  She notes she eats until she is full and doesn't push it.  Some days notes more hunger then others.  She is drinking water, club soda and occ G2.    Pharmacotherapy for weight loss: She is currently taking Zepbound  2.5mg  for medical weight loss. Denies side effects.     Previous pharmacotherapy for medical weight loss:   Wegovy  (denied side effects), Phentermine, Contrave  (didn't feel it was beneficial) and Saxenda -didn't have worsening of GERD with taking  GLP-1s -She stopped Phentermine due to not being effective.    Bariatric surgery:   Not able to proceed with bariatric surgery at this time.   Hypertension Hypertension stable.  Medication(s): Stop taking Lisinopril  last week due to hypotension. BP at home 120s/60s.    Denies chest pain, palpitations and SOB.  BP Readings from Last 3 Encounters:  11/09/23 129/66  10/10/23 102/71  09/07/23 119/69   Lab Results  Component Value Date   CREATININE 0.93 10/07/2022   CREATININE 0.92 05/24/2022   CREATININE 0.85 02/18/2022        PHYSICAL EXAM:  Blood pressure 129/66, pulse 88, temperature 98.2 F (36.8 C), height 5' 7 (1.702 m), weight 217 lb (98.4 kg), last menstrual period 10/26/2023, SpO2 97%. Body mass index is 33.99 kg/m.  General: She is overweight, cooperative, alert, well developed, and in no acute distress. PSYCH: Has normal mood, affect and thought process.   Extremities: No edema.  Neurologic: No gross sensory or motor deficits. No tremors or fasciculations noted.    DIAGNOSTIC DATA REVIEWED:  BMET    Component Value Date/Time   NA 141 10/07/2022 0805   K 4.7 10/07/2022 0805   CL 108 (H) 10/07/2022 0805   CO2 18 (L) 10/07/2022 0805   GLUCOSE 108 (H) 10/07/2022 0805   GLUCOSE 96 08/29/2019 0549   BUN 16 10/07/2022 0805   CREATININE 0.93 10/07/2022 0805   CALCIUM  9.3  10/07/2022 0805   GFRNONAA 84 05/27/2020 0938   GFRAA 96 05/27/2020 0938   Lab Results  Component Value Date   HGBA1C 5.9 (H) 10/07/2022   HGBA1C 6.1 (H) 06/19/2019   Lab Results  Component Value Date   INSULIN  18.0 10/07/2022   INSULIN  13.7 07/02/2019   Lab Results  Component Value Date   TSH 3.060 07/02/2019   CBC    Component Value Date/Time   WBC 9.1 10/07/2022 0805   WBC 12.1 (H) 08/29/2019 0549   RBC 4.69 10/07/2022 0805   RBC 4.54 08/29/2019 0549   HGB 14.3 10/07/2022 0805   HCT 42.0 10/07/2022 0805   PLT 247 10/07/2022 0805   MCV 90 10/07/2022 0805   MCH 30.5  10/07/2022 0805   MCH 30.0 08/29/2019 0549   MCHC 34.0 10/07/2022 0805   MCHC 32.9 08/29/2019 0549   RDW 12.6 10/07/2022 0805   Iron Studies No results found for: IRON, TIBC, FERRITIN, IRONPCTSAT Lipid Panel     Component Value Date/Time   CHOL 198 10/10/2023 0855   TRIG 137 10/10/2023 0855   HDL 44 10/10/2023 0855   CHOLHDL 4.0 02/18/2022 1132   LDLCALC 129 (H) 10/10/2023 0855   Hepatic Function Panel     Component Value Date/Time   PROT 7.0 10/07/2022 0805   ALBUMIN 4.4 10/07/2022 0805   AST 17 10/07/2022 0805   ALT 16 10/07/2022 0805   ALKPHOS 71 10/07/2022 0805   BILITOT 0.3 10/07/2022 0805      Component Value Date/Time   TSH 3.060 07/02/2019 1158   Nutritional Lab Results  Component Value Date   VD25OH 49.1 10/07/2022   VD25OH 46.3 02/18/2022   VD25OH 48.0 11/02/2021     ASSESSMENT AND PLAN  TREATMENT PLAN FOR OBESITY:  Recommended Dietary Goals  Shelley Ryan is currently in the action stage of change. As such, her goal is to continue weight management plan. She has agreed to the Category 2 Plan.  Behavioral Intervention  We discussed the following Behavioral Modification Strategies today: increasing lean protein intake to established goals, decreasing simple carbohydrates , increasing vegetables, increasing fiber rich foods, increasing water intake , work on meal planning and preparation, reading food labels , keeping healthy foods at home, and continue to work on maintaining a reduced calorie state, getting the recommended amount of protein, incorporating whole foods, making healthy choices, staying well hydrated and practicing mindfulness when eating..  Additional resources provided today: NA  Recommended Physical Activity Goals  Shelley Ryan has been advised to work up to 150 minutes of moderate intensity aerobic activity a week and strengthening exercises 2-3 times per week for cardiovascular health, weight loss maintenance and preservation of muscle  mass.   She has agreed to Think about enjoyable ways to increase daily physical activity and overcoming barriers to exercise, Increase physical activity in their day and reduce sedentary time (increase NEAT)., and continue to gradually increase the amount and intensity of exercise routine   Pharmacotherapy We discussed various medication options to help Inocente with her weight loss efforts and we both agreed to continue Zepbound  2.5mg .  Denies side effects.  ASSOCIATED CONDITIONS ADDRESSED TODAY  Action/Plan  Essential hypertension Doing well.  Will continue to monitor. Will continue to check BP at home.    Polyphagia -     Tirzepatide -Weight Management; Inject 2.5 mg into the skin once a week.  Dispense: 2 mL; Refill: 0  Class 1 obesity due to excess calories with serious comorbidity and body mass index (BMI)  of 33.0 to 33.9 in adult -     Tirzepatide -Weight Management; Inject 2.5 mg into the skin once a week.  Dispense: 2 mL; Refill: 0         Return in about 4 weeks (around 12/07/2023).SABRA She was informed of the importance of frequent follow up visits to maximize her success with intensive lifestyle modifications for her multiple health conditions.   ATTESTASTION STATEMENTS:  Reviewed by clinician on day of visit: allergies, medications, problem list, medical history, surgical history, family history, social history, and previous encounter notes.      Shelley Ryan SAUNDERS. Laszlo Ellerby FNP-C

## 2023-11-12 ENCOUNTER — Other Ambulatory Visit: Payer: Self-pay | Admitting: Nurse Practitioner

## 2023-11-12 DIAGNOSIS — R7303 Prediabetes: Secondary | ICD-10-CM

## 2023-11-25 ENCOUNTER — Other Ambulatory Visit: Payer: Self-pay | Admitting: Nurse Practitioner

## 2023-11-25 DIAGNOSIS — R632 Polyphagia: Secondary | ICD-10-CM

## 2023-11-25 DIAGNOSIS — E66811 Obesity, class 1: Secondary | ICD-10-CM

## 2023-12-06 ENCOUNTER — Ambulatory Visit: Admitting: Nurse Practitioner

## 2023-12-06 ENCOUNTER — Encounter: Payer: Self-pay | Admitting: Nurse Practitioner

## 2023-12-06 VITALS — BP 134/81 | HR 77 | Temp 97.8°F | Ht 67.0 in | Wt 219.0 lb

## 2023-12-06 DIAGNOSIS — E66811 Obesity, class 1: Secondary | ICD-10-CM

## 2023-12-06 DIAGNOSIS — E559 Vitamin D deficiency, unspecified: Secondary | ICD-10-CM | POA: Diagnosis not present

## 2023-12-06 DIAGNOSIS — Z6833 Body mass index (BMI) 33.0-33.9, adult: Secondary | ICD-10-CM

## 2023-12-06 DIAGNOSIS — R632 Polyphagia: Secondary | ICD-10-CM

## 2023-12-06 DIAGNOSIS — R7303 Prediabetes: Secondary | ICD-10-CM | POA: Diagnosis not present

## 2023-12-06 DIAGNOSIS — E6609 Other obesity due to excess calories: Secondary | ICD-10-CM

## 2023-12-06 MED ORDER — TIRZEPATIDE-WEIGHT MANAGEMENT 2.5 MG/0.5ML ~~LOC~~ SOLN: 2.5000 mg | SUBCUTANEOUS | 0 refills | Status: DC

## 2023-12-06 MED ORDER — METFORMIN HCL 500 MG PO TABS: ORAL_TABLET | ORAL | 0 refills | Status: DC

## 2023-12-06 MED ORDER — VITAMIN D (ERGOCALCIFEROL) 1.25 MG (50000 UNIT) PO CAPS: 50000.0000 [IU] | ORAL_CAPSULE | ORAL | 0 refills | Status: DC

## 2023-12-06 NOTE — Progress Notes (Signed)
 Office: [phone]  /  Fax: [phone]  WEIGHT SUMMARY AND BIOMETRICS  Weight Lost Since Last Visit: 0lb  Weight Gained Since Last Visit: 2lb   Vitals Temp: 97.8 F (36.6 C) BP: 134/81 Pulse Rate: 77 SpO2: 97 %   Anthropometric Measurements Height: 5' 7 (1.702 m) Weight: 219 lb (99.3 kg) BMI (Calculated): 34.29 Weight at Last Visit: 217lb Weight Lost Since Last Visit: 0lb Weight Gained Since Last Visit: 2lb Starting Weight: 242lb Total Weight Loss (lbs): 25 lb (11.3 kg)   Body Composition  Body Fat %: 40.3 % Fat Mass (lbs): 88.4 lbs Muscle Mass (lbs): 124.2 lbs Total Body Water (lbs): 84.8 lbs Visceral Fat Rating : 10   Other Clinical Data Fasting: No Labs: No Today's Visit #: 75 Starting Date: 07/02/19     HPI  Chief Complaint: OBESITY  Shelley Ryan is here to discuss her progress with her obesity treatment plan. She is on the the Category 2 Plan and states she is following her eating plan approximately 90 % of the time. She states she is exercising 30 minutes 3 days per week.   Interval History:  Since last office visit she has gained 2 pounds. She has been trying to make healthier choices, aiming to eat more protein (90-110 grams) and has increased her fiber intake.  She is eating Magic spoon, Wheates protein cereal, Ghost protein cereal with fairlife milk  or a protein shake with toast for breakfast. She is eating a frozen meal, a fruit and yogurt for lunch.  She is eating 6-8oz of lean protein with a vegetable for dinner. She snacks on a mini kind bar or a different protein bar 1-2 times per week.  She reports she is weighing and measuring her foods.  She is drinking water, water with electrolytes, coffee with collagen, club soda and occ G2. She is exercising 3 days per week-resistance training and walking.  She is averaging around 7,000 steps per day.  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    She is going to [place] tomorrow.   She is up  4 lbs water weight.   Pharmacotherapy for weight loss: She is currently taking Zepbound  2.5mg  for medical weight loss. Denies side effects.     Previous pharmacotherapy for medical weight loss:   -Wegovy  (denied side effects) -Contrave  (didn't feel it was beneficial)  -Saxenda -didn't have worsening of GERD  -She stopped Phentermine due to not being effective.    Bariatric surgery:   Not able to proceed with bariatric surgery   Prediabetes Last A1c was 5.9 on 07/27/23  Medication(s): Metformin  500mg  BID.  Denies side effects.  Polyphagia:No Lab Results  Component Value Date   HGBA1C 5.9 (H) 10/07/2022   HGBA1C 5.9 (H) 05/24/2022   HGBA1C 6.0 (H) 02/18/2022   HGBA1C 6.0 (H) 11/02/2021   HGBA1C 5.7 (H) 06/25/2021   Lab Results  Component Value Date   INSULIN  18.0 10/07/2022   INSULIN  13.4 05/24/2022   INSULIN  15.8 02/18/2022   INSULIN  9.4 11/02/2021   INSULIN  18.2 06/25/2021   Vit D deficiency  She is taking Vit D 50,000 IU weekly.  Denies side effects.  Denies nausea, vomiting or muscle weakness.    Last Vit D was 41 on 07/27/23  Lab Results  Component Value Date   VD25OH 49.1 10/07/2022   VD25OH 46.3 02/18/2022   VD25OH 48.0 11/02/2021    PHYSICAL EXAM:  Blood pressure 134/81, pulse 77, temperature 97.8 F (36.6 C), height 5'  7 (1.702 m), weight 219 lb (99.3 kg), last menstrual period 11/29/2023, SpO2 97%. Body mass index is 34.3 kg/m.  General: She is overweight, cooperative, alert, well developed, and in no acute distress. PSYCH: Has normal mood, affect and thought process.   Extremities: No edema.  Neurologic: No gross sensory or motor deficits. No tremors or fasciculations noted.    DIAGNOSTIC DATA REVIEWED:  BMET    Component Value Date/Time   NA 141 10/07/2022 0805   K 4.7 10/07/2022 0805   CL 108 (H) 10/07/2022 0805   CO2 18 (L) 10/07/2022 0805   GLUCOSE 108 (H) 10/07/2022 0805   GLUCOSE 96 08/29/2019 0549   BUN 16 10/07/2022 0805   CREATININE  0.93 10/07/2022 0805   CALCIUM  9.3 10/07/2022 0805   GFRNONAA 84 05/27/2020 0938   GFRAA 96 05/27/2020 0938   Lab Results  Component Value Date   HGBA1C 5.9 (H) 10/07/2022   HGBA1C 6.1 (H) 06/19/2019   Lab Results  Component Value Date   INSULIN  18.0 10/07/2022   INSULIN  13.7 07/02/2019   Lab Results  Component Value Date   TSH 3.060 07/02/2019   CBC    Component Value Date/Time   WBC 9.1 10/07/2022 0805   WBC 12.1 (H) 08/29/2019 0549   RBC 4.69 10/07/2022 0805   RBC 4.54 08/29/2019 0549   HGB 14.3 10/07/2022 0805   HCT 42.0 10/07/2022 0805   PLT 247 10/07/2022 0805   MCV 90 10/07/2022 0805   MCH 30.5 10/07/2022 0805   MCH 30.0 08/29/2019 0549   MCHC 34.0 10/07/2022 0805   MCHC 32.9 08/29/2019 0549   RDW 12.6 10/07/2022 0805   Iron Studies No results found for: IRON, TIBC, FERRITIN, IRONPCTSAT Lipid Panel     Component Value Date/Time   CHOL 198 10/10/2023 0855   TRIG 137 10/10/2023 0855   HDL 44 10/10/2023 0855   CHOLHDL 4.0 02/18/2022 1132   LDLCALC 129 (H) 10/10/2023 0855   Hepatic Function Panel     Component Value Date/Time   PROT 7.0 10/07/2022 0805   ALBUMIN 4.4 10/07/2022 0805   AST 17 10/07/2022 0805   ALT 16 10/07/2022 0805   ALKPHOS 71 10/07/2022 0805   BILITOT 0.3 10/07/2022 0805      Component Value Date/Time   TSH 3.060 07/02/2019 1158   Nutritional Lab Results  Component Value Date   VD25OH 49.1 10/07/2022   VD25OH 46.3 02/18/2022   VD25OH 48.0 11/02/2021     ASSESSMENT AND PLAN  TREATMENT PLAN FOR OBESITY:  Recommended Dietary Goals  Shelley Ryan is currently in the action stage of change. As such, her goal is to continue weight management plan. She has agreed to keeping a food journal and adhering to recommended goals of 1500 calories and 100+ grams of protein. I will have her increase her calories due to increasing her activity level.  Will continue to monitor her weight at home and will let me know if she sees an  increase in her weight with increasing her calories.   Behavioral Intervention  We discussed the following Behavioral Modification Strategies today: increasing lean protein intake to established goals, decreasing simple carbohydrates , increasing vegetables, increasing fiber rich foods, increasing water intake , work on meal planning and preparation, work on tracking and journaling calories using tracking application, reading food labels , keeping healthy foods at home, and continue to work on maintaining a reduced calorie state, getting the recommended amount of protein, incorporating whole foods, making healthy choices, staying well  hydrated and practicing mindfulness when eating..  Additional resources provided today: NA  Recommended Physical Activity Goals  Shelley Ryan has been advised to work up to 150 minutes of moderate intensity aerobic activity a week and strengthening exercises 2-3 times per week for cardiovascular health, weight loss maintenance and preservation of muscle mass.   She has agreed to Continue current level of physical activity , Think about enjoyable ways to increase daily physical activity and overcoming barriers to exercise, Increase physical activity in their day and reduce sedentary time (increase NEAT)., and continue to gradually increase the amount and intensity of exercise routine   Pharmacotherapy We discussed various medication options to help Shelley Ryan with her weight loss efforts and we both agreed to continue Zepbound  2.5mg .  side effects discussed.    ASSOCIATED CONDITIONS ADDRESSED TODAY  Action/Plan  Prediabetes -     metFORMIN  HCl; TAKE 1 TABLET BY MOUTH TWICE DAILY  Dispense: 180 tablet; Refill: 0  Vitamin D  deficiency -     Vitamin D  (Ergocalciferol ); Take 1 capsule (50,000 Units total) by mouth every 7 (seven) days.  Dispense: 12 capsule; Refill: 0  Polyphagia -     Tirzepatide -Weight Management; Inject 2.5 mg into the skin once a week.  Dispense: 2 mL;  Refill: 0  Class 1 obesity due to excess calories with serious comorbidity and body mass index (BMI) of 33.0 to 33.9 in adult -     Tirzepatide -Weight Management; Inject 2.5 mg into the skin once a week.  Dispense: 2 mL; Refill: 0      Labs obtained at [facility] on 07/27/23: Last A1c was 5.9 CMP wnl TSH wnl Vit d 41   Return in about 3 weeks (around 12/27/2023).Shelley Ryan She was informed of the importance of frequent follow up visits to maximize her success with intensive lifestyle modifications for her multiple health conditions.   ATTESTASTION STATEMENTS:  Reviewed by clinician on day of visit: allergies, medications, problem list, medical history, surgical history, family history, social history, and previous encounter notes.     Shelley Ryan. Pearce Littlefield FNP-C

## 2023-12-27 ENCOUNTER — Other Ambulatory Visit: Payer: Self-pay | Admitting: Nurse Practitioner

## 2023-12-27 DIAGNOSIS — R632 Polyphagia: Secondary | ICD-10-CM

## 2023-12-27 DIAGNOSIS — E66811 Obesity, class 1: Secondary | ICD-10-CM

## 2024-01-03 ENCOUNTER — Ambulatory Visit: Admitting: Nurse Practitioner

## 2024-01-03 ENCOUNTER — Encounter: Payer: Self-pay | Admitting: Nurse Practitioner

## 2024-01-03 VITALS — BP 108/73 | HR 80 | Temp 98.1°F | Ht 67.0 in | Wt 212.0 lb

## 2024-01-03 DIAGNOSIS — E66811 Obesity, class 1: Secondary | ICD-10-CM | POA: Diagnosis not present

## 2024-01-03 DIAGNOSIS — Z6833 Body mass index (BMI) 33.0-33.9, adult: Secondary | ICD-10-CM

## 2024-01-03 DIAGNOSIS — R7303 Prediabetes: Secondary | ICD-10-CM | POA: Diagnosis not present

## 2024-01-03 DIAGNOSIS — R632 Polyphagia: Secondary | ICD-10-CM

## 2024-01-03 MED ORDER — TIRZEPATIDE-WEIGHT MANAGEMENT 2.5 MG/0.5ML ~~LOC~~ SOLN: 2.5000 mg | SUBCUTANEOUS | 0 refills | Status: DC

## 2024-01-03 NOTE — Progress Notes (Signed)
 Office: [phone]  /  Fax: [phone]  WEIGHT SUMMARY AND BIOMETRICS  Weight Lost Since Last Visit: 7lb  Weight Gained Since Last Visit: 0lb   Vitals Temp: 98.1 F (36.7 C) BP: 108/73 Pulse Rate: 80 SpO2: 97 %   Anthropometric Measurements Height: 5' 7 (1.702 m) Weight: 212 lb (96.2 kg) BMI (Calculated): 33.2 Weight at Last Visit: 219lb Weight Lost Since Last Visit: 7lb Weight Gained Since Last Visit: 0lb Starting Weight: 242lb Total Weight Loss (lbs): 30 lb (13.6 kg)   Body Composition  Body Fat %: 39.6 % Fat Mass (lbs): 84.2 lbs Muscle Mass (lbs): 122 lbs Total Body Water (lbs): 80.2 lbs Visceral Fat Rating : 10   Other Clinical Data Fasting: Yes Labs: No Today's Visit #: 26 Starting Date: 07/02/19     HPI  Chief Complaint: OBESITY  Shelley Ryan is here to discuss her progress with her obesity treatment plan. She is on the the Category 2 Plan and keeping a food journal and adhering to recommended goals of 1500 calories and 120 protein and states she is following her eating plan approximately 90 % of the time. She states she is exercising 30 minutes 3 days per week.   Interval History:  Since last office visit she has lost 7 pounds.  She is averaging around 1500-1800 calories and 100-120 grams of protein.  She is aiming to eat protein, fruits and vegetables.  She notes her dysphagia is getting worse.  She has seen GI in the past.  She continue to struggle with headaches and she saw the neurosurgeon on 12/20/23.  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 He's managing her dysphagia and migraines.  She is reaching out to him this week for furthering testing. She has been on Plavix for the past 2 weeks. She is drinking water with flavoring and electrolytes.  She is walking and doing resistance training 3 days per week.    Her highest weight was 260 lbs.    Pharmacotherapy for weight loss: She is  currently taking Zepbound  2.5mg  for medical weight loss. Denies side effects.     Previous pharmacotherapy for medical weight loss:   -Wegovy  (denied side effects) -Contrave  (didn't feel it was beneficial)  -Saxenda -didn't have worsening of GERD  -She stopped Phentermine due to not being effective.    Bariatric surgery:   Not able to proceed with bariatric surgery   Prediabetes Last A1c was 5.9  Medication(s): Zepbound  2.5 mg SQ weekly and Metformin  500mg  BID.  Denies side effects.   Polyphagia:Yes Lab Results  Component Value Date   HGBA1C 5.9 (H) 10/07/2022   HGBA1C 5.9 (H) 05/24/2022   HGBA1C 6.0 (H) 02/18/2022   HGBA1C 6.0 (H) 11/02/2021   HGBA1C 5.7 (H) 06/25/2021   Lab Results  Component Value Date   INSULIN  18.0 10/07/2022   INSULIN  13.4 05/24/2022   INSULIN  15.8 02/18/2022   INSULIN  9.4 11/02/2021   INSULIN  18.2 06/25/2021    PHYSICAL EXAM:  Blood pressure 108/73, pulse 80, temperature 98.1 F (36.7 C), height 5' 7 (1.702 m), weight 212 lb (96.2 kg), last menstrual period 12/27/2023, SpO2 97%. Body mass index is 33.2 kg/m.  General: She is overweight, cooperative, alert, well developed, and in no acute distress. PSYCH: Has normal mood, affect and thought process.   Extremities: No edema.  Neurologic: No gross sensory or motor deficits. No tremors or fasciculations noted.    DIAGNOSTIC DATA REVIEWED:  BMET  Component Value Date/Time   NA 141 10/07/2022 0805   K 4.7 10/07/2022 0805   CL 108 (H) 10/07/2022 0805   CO2 18 (L) 10/07/2022 0805   GLUCOSE 108 (H) 10/07/2022 0805   GLUCOSE 96 08/29/2019 0549   BUN 16 10/07/2022 0805   CREATININE 0.93 10/07/2022 0805   CALCIUM  9.3 10/07/2022 0805   GFRNONAA 84 05/27/2020 0938   GFRAA 96 05/27/2020 0938   Lab Results  Component Value Date   HGBA1C 5.9 (H) 10/07/2022   HGBA1C 6.1 (H) 06/19/2019   Lab Results  Component Value Date   INSULIN  18.0 10/07/2022   INSULIN  13.7 07/02/2019   Lab Results   Component Value Date   TSH 3.060 07/02/2019   CBC    Component Value Date/Time   WBC 9.1 10/07/2022 0805   WBC 12.1 (H) 08/29/2019 0549   RBC 4.69 10/07/2022 0805   RBC 4.54 08/29/2019 0549   HGB 14.3 10/07/2022 0805   HCT 42.0 10/07/2022 0805   PLT 247 10/07/2022 0805   MCV 90 10/07/2022 0805   MCH 30.5 10/07/2022 0805   MCH 30.0 08/29/2019 0549   MCHC 34.0 10/07/2022 0805   MCHC 32.9 08/29/2019 0549   RDW 12.6 10/07/2022 0805   Iron Studies No results found for: IRON, TIBC, FERRITIN, IRONPCTSAT Lipid Panel     Component Value Date/Time   CHOL 198 10/10/2023 0855   TRIG 137 10/10/2023 0855   HDL 44 10/10/2023 0855   CHOLHDL 4.0 02/18/2022 1132   LDLCALC 129 (H) 10/10/2023 0855   Hepatic Function Panel     Component Value Date/Time   PROT 7.0 10/07/2022 0805   ALBUMIN 4.4 10/07/2022 0805   AST 17 10/07/2022 0805   ALT 16 10/07/2022 0805   ALKPHOS 71 10/07/2022 0805   BILITOT 0.3 10/07/2022 0805      Component Value Date/Time   TSH 3.060 07/02/2019 1158   Nutritional Lab Results  Component Value Date   VD25OH 49.1 10/07/2022   VD25OH 46.3 02/18/2022   VD25OH 48.0 11/02/2021     ASSESSMENT AND PLAN  TREATMENT PLAN FOR OBESITY:  Recommended Dietary Goals  Miliana is currently in the action stage of change. As such, her goal is to continue weight management plan. She has agreed to keeping a food journal and adhering to recommended goals of 1500-1800 calories and 100 grams of protein.  Behavioral Intervention  We discussed the following Behavioral Modification Strategies today: increasing lean protein intake to established goals, decreasing simple carbohydrates , increasing vegetables, increasing fiber rich foods, increasing water intake , reading food labels , keeping healthy foods at home, continue to work on implementation of reduced calorie nutritional plan, continue to practice mindfulness when eating, planning for success, continue to work on  maintaining a reduced calorie state, getting the recommended amount of protein, incorporating whole foods, making healthy choices, staying well hydrated and practicing mindfulness when eating., and increase protein intake, fibrous foods (25 grams per day for women, 30 grams for men) and water to improve satiety and decrease hunger signals. .  Additional resources provided today: NA  Recommended Physical Activity Goals  Eliana has been advised to work up to 150 minutes of moderate intensity aerobic activity a week and strengthening exercises 2-3 times per week for cardiovascular health, weight loss maintenance and preservation of muscle mass.   She has agreed to Think about enjoyable ways to increase daily physical activity and overcoming barriers to exercise, Increase physical activity in their day and reduce sedentary  time (increase NEAT)., Continue to gradually increase the amount and intensity of exercise routine, and Combine aerobic and strengthening exercises for efficiency and improved cardiometabolic health.   Pharmacotherapy We discussed various medication options to help Inocente with her weight loss efforts and we both agreed to continue Zepbound  2.5mg .  Side effects disucssed.  ASSOCIATED CONDITIONS ADDRESSED TODAY  Action/Plan  Prediabetes Continue Metformin  500mg  BID.    Polyphagia -     Continue Tirzepatide -Weight Management; Inject 2.5 mg into the skin once a week.  Dispense: 2 mL; Refill: 0  Class 1 obesity due to excess calories with serious comorbidity and body mass index (BMI) of 33.0 to 33.9 in adult -     Continue Tirzepatide -Weight Management; Inject 2.5 mg into the skin once a week.  Dispense: 2 mL; Refill: 0  Labs obtained at [facility] on 07/27/23: Last A1c was 5.9 CMP wnl TSH wnl Vit d 41     Will obtain labs at next visit-CMP, TSH  Last CMP last 4/25  Return in about 4 weeks (around 01/31/2024).SABRA She was informed of the importance of frequent follow up visits to  maximize her success with intensive lifestyle modifications for her multiple health conditions.   ATTESTASTION STATEMENTS:  Reviewed by clinician on day of visit: allergies, medications, problem list, medical history, surgical history, family history, social history, and previous encounter notes.     Corean SAUNDERS. Ceasar Decandia FNP-C

## 2024-01-23 ENCOUNTER — Other Ambulatory Visit: Payer: Self-pay | Admitting: Nurse Practitioner

## 2024-01-23 DIAGNOSIS — E66811 Obesity, class 1: Secondary | ICD-10-CM

## 2024-01-23 DIAGNOSIS — R632 Polyphagia: Secondary | ICD-10-CM

## 2024-02-01 ENCOUNTER — Ambulatory Visit (INDEPENDENT_AMBULATORY_CARE_PROVIDER_SITE_OTHER): Admitting: Nurse Practitioner

## 2024-02-01 ENCOUNTER — Encounter: Payer: Self-pay | Admitting: Nurse Practitioner

## 2024-02-01 VITALS — BP 120/76 | HR 89 | Temp 98.1°F | Ht 67.0 in | Wt 211.0 lb

## 2024-02-01 DIAGNOSIS — R7303 Prediabetes: Secondary | ICD-10-CM

## 2024-02-01 DIAGNOSIS — Z79899 Other long term (current) drug therapy: Secondary | ICD-10-CM

## 2024-02-01 DIAGNOSIS — R5383 Other fatigue: Secondary | ICD-10-CM

## 2024-02-01 DIAGNOSIS — D72829 Elevated white blood cell count, unspecified: Secondary | ICD-10-CM | POA: Diagnosis not present

## 2024-02-01 DIAGNOSIS — R7989 Other specified abnormal findings of blood chemistry: Secondary | ICD-10-CM | POA: Diagnosis not present

## 2024-02-01 DIAGNOSIS — E66811 Obesity, class 1: Secondary | ICD-10-CM

## 2024-02-01 DIAGNOSIS — Z6833 Body mass index (BMI) 33.0-33.9, adult: Secondary | ICD-10-CM

## 2024-02-01 NOTE — Progress Notes (Signed)
 Office: [phone]  /  Fax: [phone]  WEIGHT SUMMARY AND BIOMETRICS  Weight Lost Since Last Visit: 1lb  Weight Gained Since Last Visit: 0lb   Vitals Temp: 98.1 F (36.7 C) BP: 120/76 Pulse Rate: 89 SpO2: 97 %   Anthropometric Measurements Height: 5' 7 (1.702 m) Weight: 211 lb (95.7 kg) BMI (Calculated): 33.04 Weight at Last Visit: 212lb Weight Lost Since Last Visit: 1lb Weight Gained Since Last Visit: 0lb Starting Weight: 242lb Total Weight Loss (lbs): 31 lb (14.1 kg)   Body Composition  Body Fat %: 40.3 % Fat Mass (lbs): 85.4 lbs Muscle Mass (lbs): 120 lbs Total Body Water (lbs): 82 lbs Visceral Fat Rating : 10   Other Clinical Data Fasting: Yes Labs: Yes Today's Visit #: 34 Starting Date: 07/02/19     HPI  Chief Complaint: OBESITY  Mykael is here to discuss her progress with her obesity treatment plan. She is on the keeping a food journal and adhering to recommended goals of 1500 calories and 120 protein and states she is following her eating plan approximately 75 % of the time. She states she is exercising 0 minutes 0 days per week.   Interval History:  Since last office visit she has lost 1 pound.  She continues to struggle with dysphagia and and other neurological symptoms. She recently had imaging at [facility] and when she went to the ER on 01/10/24.  She saw ophthalmology on 01/24/24.  She is struggling with following the meal plan, is skipping meals due to her work schedule and is unable to exericse due to her headaches.  She is currently trying to get a follow up appt scheduled with her neurologist and neurosurgeon.    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   Her highest weight was 260 lbs.     Pharmacotherapy for weight loss: She is currently taking Zepbound  2.5mg  (self pay) for medical weight loss. Denies side effects.     Previous pharmacotherapy for medical weight loss:   -Wegovy  (denied side effects) -Contrave  (didn't feel it was beneficial)  -Saxenda -didn't have worsening of GERD  -She stopped Phentermine due to not being effective.    Bariatric surgery:   Not able to proceed with bariatric surgery per GI  Prediabetes Last A1c was 5.9  Medication(s):  Zepbound  2.5 mg SQ weekly and Metformin  500mg  BID.  Polyphagia:No Lab Results  Component Value Date   HGBA1C 5.9 (H) 10/07/2022   HGBA1C 5.9 (H) 05/24/2022   HGBA1C 6.0 (H) 02/18/2022   HGBA1C 6.0 (H) 11/02/2021   HGBA1C 5.7 (H) 06/25/2021   Lab Results  Component Value Date   INSULIN  18.0 10/07/2022   INSULIN  13.4 05/24/2022   INSULIN  15.8 02/18/2022   INSULIN  9.4 11/02/2021   INSULIN  18.2 06/25/2021      PHYSICAL EXAM:  Blood pressure 120/76, pulse 89, temperature 98.1 F (36.7 C), height 5' 7 (1.702 m), weight 211 lb (95.7 kg), last menstrual period 12/27/2023, SpO2 97%. Body mass index  is 33.05 kg/m.  General: She is overweight, cooperative, alert, well developed, and in no acute distress. PSYCH: Has normal mood, affect and thought process.   Extremities: No edema.  Neurologic: No gross sensory or motor deficits. No tremors or fasciculations noted.    DIAGNOSTIC DATA REVIEWED:  BMET    Component Value Date/Time   NA 141 10/07/2022 0805   K 4.7 10/07/2022 0805   CL 108 (H) 10/07/2022 0805   CO2 18 (L) 10/07/2022 0805   GLUCOSE 108 (H) 10/07/2022 0805   GLUCOSE 96 08/29/2019 0549   BUN 16 10/07/2022 0805   CREATININE  0.93 10/07/2022 0805   CALCIUM  9.3 10/07/2022 0805   GFRNONAA 84 05/27/2020 0938   GFRAA 96 05/27/2020 0938   Lab Results  Component Value Date   HGBA1C 5.9 (H) 10/07/2022   HGBA1C 6.1 (H) 06/19/2019   Lab Results  Component Value Date   INSULIN  18.0 10/07/2022   INSULIN  13.7 07/02/2019   Lab Results  Component Value Date   TSH 3.060 07/02/2019   CBC    Component Value Date/Time   WBC 9.1 10/07/2022 0805   WBC 12.1 (H) 08/29/2019 0549   RBC 4.69 10/07/2022 0805   RBC 4.54 08/29/2019 0549   HGB 14.3 10/07/2022 0805   HCT 42.0 10/07/2022 0805   PLT 247 10/07/2022 0805   MCV 90 10/07/2022 0805   MCH 30.5 10/07/2022 0805   MCH 30.0 08/29/2019 0549   MCHC 34.0 10/07/2022 0805   MCHC 32.9 08/29/2019 0549   RDW 12.6 10/07/2022 0805   Iron Studies No results found for: IRON, TIBC, FERRITIN, IRONPCTSAT Lipid Panel     Component Value Date/Time   CHOL 198 10/10/2023 0855   TRIG 137 10/10/2023 0855   HDL 44 10/10/2023 0855   CHOLHDL 4.0 02/18/2022 1132   LDLCALC 129 (H) 10/10/2023 0855   Hepatic Function Panel     Component Value Date/Time   PROT 7.0 10/07/2022 0805   ALBUMIN 4.4 10/07/2022 0805   AST 17 10/07/2022 0805   ALT 16 10/07/2022 0805   ALKPHOS 71 10/07/2022 0805   BILITOT 0.3 10/07/2022 0805      Component Value Date/Time   TSH 3.060 07/02/2019 1158   Nutritional Lab Results  Component Value Date   VD25OH 49.1 10/07/2022   VD25OH 46.3 02/18/2022   VD25OH 48.0 11/02/2021     ASSESSMENT AND PLAN  TREATMENT PLAN FOR OBESITY:  Recommended Dietary Goals  Ahsha is currently in the action stage of change. As such, her goal is to continue weight management plan. She has agreed to practicing portion control and making smarter food choices, such as increasing vegetables and decreasing simple carbohydrates.  Behavioral Interventio  We discussed the following Behavioral Modification Strategies today: increasing lean protein intake to  established goals, decreasing simple carbohydrates , increasing vegetables, increasing fiber rich foods, avoiding skipping meals, increasing water intake , work on meal planning and preparation, reading food labels , planning for success, continue to work on maintaining a reduced calorie state, getting the recommended amount of protein, incorporating whole foods, making healthy choices, staying well hydrated and practicing mindfulness when eating., and increase protein intake, fibrous foods (25 grams per day for women, 30 grams for men) and water to improve satiety and decrease hunger signals. .  Additional resources provided today: NA  Recommended Physical Activity Goals  Zarra is unable to exercise due to her migraines, etc.  Plans to make follow up  appt with neurology to discuss next  best plan of care     Pharmacotherapy We discussed various medication options to help Inocente with her weight loss efforts and we both agreed to stop Zepbound  due to current problems with migraines, etc.  Needs to follow up with neurology to discuss next best plan of care. She is not able to focus on weight loss at this time due to her other symptoms/complaints.   ASSOCIATED CONDITIONS ADDRESSED TODAY  Action/Plan  Leukocytosis, unspecified type -     CBC with Differential/Platelet  Will refer to hematology based upon lab results.  She has not seen hematology in the past.   High serum vitamin B12 -     Vitamin B12  She is currently taking a MVI.    Other fatigue -     TSH -     CBC with Differential/Platelet  Prediabetes -     Hemoglobin A1c  Class 1 obesity due to excess calories with serious comorbidity and body mass index (BMI) of 33.0 to 33.9 in adult    Will stop Zepbound .  Needs to follow up with neurology and neurosurgeon.      Return in about 2 months (around 04/02/2024).SABRA She was informed of the importance of frequent follow up visits to maximize her success with intensive lifestyle  modifications for her multiple health conditions.   ATTESTASTION STATEMENTS:  Reviewed by clinician on day of visit: allergies, medications, problem list, medical history, surgical history, family history, social history, and previous encounter notes.   I personally spent a total of 65 minutes in the care of the patient today including preparing to see the patient, getting/reviewing separately obtained history, performing a medically appropriate exam/evaluation, counseling and educating, documenting clinical information in the EHR, independently interpreting results, and communicating results.    Corean SAUNDERS. Siddhant Hashemi FNP-C

## 2024-02-02 LAB — CBC WITH DIFFERENTIAL/PLATELET
Basophils Absolute: 0 x10E3/uL (ref 0.0–0.2)
Basos: 1 %
EOS (ABSOLUTE): 0.1 x10E3/uL (ref 0.0–0.4)
Eos: 1 %
Hematocrit: 44.9 % (ref 34.0–46.6)
Hemoglobin: 14.6 g/dL (ref 11.1–15.9)
Immature Grans (Abs): 0 x10E3/uL (ref 0.0–0.1)
Immature Granulocytes: 0 %
Lymphocytes Absolute: 1.7 x10E3/uL (ref 0.7–3.1)
Lymphs: 22 %
MCH: 29.8 pg (ref 26.6–33.0)
MCHC: 32.5 g/dL (ref 31.5–35.7)
MCV: 92 fL (ref 79–97)
Monocytes Absolute: 0.4 x10E3/uL (ref 0.1–0.9)
Monocytes: 5 %
Neutrophils Absolute: 5.4 x10E3/uL (ref 1.4–7.0)
Neutrophils: 71 %
Platelets: 212 x10E3/uL (ref 150–450)
RBC: 4.9 x10E6/uL (ref 3.77–5.28)
RDW: 13 % (ref 11.7–15.4)
WBC: 7.6 x10E3/uL (ref 3.4–10.8)

## 2024-02-02 LAB — HEMOGLOBIN A1C
Est. average glucose Bld gHb Est-mCnc: 117 mg/dL
Hgb A1c MFr Bld: 5.7 % — ABNORMAL HIGH (ref 4.8–5.6)

## 2024-02-02 LAB — TSH: TSH: 2.17 u[IU]/mL (ref 0.450–4.500)

## 2024-02-02 LAB — VITAMIN B12: Vitamin B-12: 1155 pg/mL (ref 232–1245)

## 2024-02-05 ENCOUNTER — Other Ambulatory Visit: Payer: Self-pay | Admitting: Nurse Practitioner

## 2024-02-05 DIAGNOSIS — E559 Vitamin D deficiency, unspecified: Secondary | ICD-10-CM

## 2024-02-06 ENCOUNTER — Other Ambulatory Visit: Payer: Self-pay | Admitting: Family Medicine

## 2024-02-06 DIAGNOSIS — Z1231 Encounter for screening mammogram for malignant neoplasm of breast: Secondary | ICD-10-CM

## 2024-02-11 ENCOUNTER — Other Ambulatory Visit: Payer: Self-pay | Admitting: Nurse Practitioner

## 2024-02-11 DIAGNOSIS — R7303 Prediabetes: Secondary | ICD-10-CM

## 2024-03-21 ENCOUNTER — Ambulatory Visit

## 2024-03-26 ENCOUNTER — Other Ambulatory Visit: Payer: Self-pay | Admitting: Nurse Practitioner

## 2024-03-26 DIAGNOSIS — E559 Vitamin D deficiency, unspecified: Secondary | ICD-10-CM

## 2024-03-28 ENCOUNTER — Ambulatory Visit
Admission: RE | Admit: 2024-03-28 | Discharge: 2024-03-28 | Disposition: A | Source: Ambulatory Visit | Attending: Family Medicine | Admitting: Family Medicine

## 2024-03-28 DIAGNOSIS — Z1231 Encounter for screening mammogram for malignant neoplasm of breast: Secondary | ICD-10-CM

## 2024-04-02 ENCOUNTER — Encounter: Payer: Self-pay | Admitting: Nurse Practitioner

## 2024-04-02 ENCOUNTER — Ambulatory Visit: Admitting: Nurse Practitioner

## 2024-04-02 VITALS — BP 132/79 | HR 97 | Temp 98.0°F | Ht 67.0 in | Wt 218.0 lb

## 2024-04-02 DIAGNOSIS — I639 Cerebral infarction, unspecified: Secondary | ICD-10-CM | POA: Diagnosis not present

## 2024-04-02 DIAGNOSIS — R7303 Prediabetes: Secondary | ICD-10-CM

## 2024-04-02 DIAGNOSIS — E66811 Obesity, class 1: Secondary | ICD-10-CM

## 2024-04-02 DIAGNOSIS — D72829 Elevated white blood cell count, unspecified: Secondary | ICD-10-CM

## 2024-04-02 DIAGNOSIS — G4733 Obstructive sleep apnea (adult) (pediatric): Secondary | ICD-10-CM | POA: Diagnosis not present

## 2024-04-02 DIAGNOSIS — Z6834 Body mass index (BMI) 34.0-34.9, adult: Secondary | ICD-10-CM

## 2024-04-02 MED ORDER — WEGOVY 0.25 MG/0.5ML ~~LOC~~ SOAJ: 0.2500 mg | SUBCUTANEOUS | 0 refills | Status: DC

## 2024-04-02 NOTE — Progress Notes (Signed)
 Office: [phone]  /  Fax: [phone]  WEIGHT SUMMARY AND BIOMETRICS  Weight Lost Since Last Visit: 0lb  Weight Gained Since Last Visit: 7lb   Vitals Temp: 98 F (36.7 C) BP: 132/79 Pulse Rate: 97 SpO2: 99 %   Anthropometric Measurements Height: 5' 7 (1.702 m) Weight: 218 lb (98.9 kg) BMI (Calculated): 34.14 Weight at Last Visit: 211lb Weight Lost Since Last Visit: 0lb Weight Gained Since Last Visit: 7lb Starting Weight: 242lb Total Weight Loss (lbs): 24 lb (10.9 kg)   Body Composition  Body Fat %: 40.5 % Fat Mass (lbs): 88.4 lbs Muscle Mass (lbs): 123.4 lbs Total Body Water (lbs): 83 lbs Visceral Fat Rating : 10   Other Clinical Data Fasting: No Labs: No Today's Visit #: 77 Starting Date: 07/02/19     HPI  Chief Complaint: OBESITY  Shelley Ryan is here to discuss her progress with her obesity treatment plan. She is on the keeping a food journal and adhering to recommended goals of 1500 calories and 120 protein and states she is following her eating plan approximately 50 % of the time. She states she is exercising 0 minutes 0 days per week.   Interval History:  She has a PMH of HTN, migraines, IIH, CVA, OSAS, GERD, pyloric stenosis, esophageal dysphagia, LEE, prediabetes, HLD and Vit D def.    Since last office visit on 02/01/24 she has gained 7 pounds.  Her husband was diagnosed with rectal cancer and she has been taking him back and forth to chemo.   She hasn't been tracking or following her meal plans since her husband diagnosis.  She is here today to get back on track. She doesn't want to regain her weight back.  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    Pharmacotherapy for weight loss: She is  not currently taking Zepbound  2.5mg  (self pay) for medical weight loss. Denied side effects while taking.    Previous pharmacotherapy for medical weight loss:   -Wegovy  (denied side effects) -Contrave  (didn't feel it was beneficial)   -Saxenda -didn't have worsening of GERD  -She stopped Phentermine due to not being effective.    Bariatric surgery:   Not able to proceed with bariatric surgery per GI  Prediabetes Last A1c was 5.7  Medication(s): Metformin  500mg  BID.  Side effects discussed Polyphagia:Yes Lab Results  Component Value Date   HGBA1C 5.7 (H) 02/01/2024   HGBA1C 5.9 (H) 10/07/2022   HGBA1C 5.9 (H) 05/24/2022   HGBA1C 6.0 (H) 02/18/2022   HGBA1C 6.0 (H) 11/02/2021   Lab Results  Component Value Date   INSULIN  18.0 10/07/2022   INSULIN  13.4 05/24/2022   INSULIN  15.8 02/18/2022   INSULIN  9.4 11/02/2021   INSULIN  18.2 06/25/2021    Obstructive Sleep Apnea Anyah has a diagnosis of sleep apnea. She reports that she using her dental device nightly.  She saw her dentist 3 months ago with a repeat HST 3 months ago.      Leukocytosis Last CBC was 02/01/24:  WBCs were wnl.    CBC    Component Value Date/Time   WBC 7.6 02/01/2024 0935   WBC 12.1 (H) 08/29/2019 0549   RBC 4.90 02/01/2024 0935   RBC 4.54 08/29/2019 0549   HGB 14.6 02/01/2024 0935   HCT 44.9 02/01/2024 0935   PLT 212 02/01/2024 0935   MCV 92 02/01/2024 0935   MCH 29.8 02/01/2024 0935   MCH 30.0 08/29/2019 0549   MCHC 32.5 02/01/2024 0935   MCHC  32.9 08/29/2019 0549   RDW 13.0 02/01/2024 0935   LYMPHSABS 1.7 02/01/2024 0935   MONOABS 0.7 08/28/2019 1358   EOSABS 0.1 02/01/2024 0935   BASOSABS 0.0 02/01/2024 0935     PHYSICAL EXAM:  Blood pressure 132/79, pulse 97, temperature 98 F (36.7 C), height 5' 7 (1.702 m), weight 218 lb (98.9 kg), SpO2 99%. Body mass index is 34.14 kg/m.  General: She is overweight, cooperative, alert, well developed, and in no acute distress. PSYCH: Has normal mood, affect and thought process.   Extremities: No edema.  Neurologic: No gross sensory or motor deficits. No tremors or fasciculations noted.    DIAGNOSTIC DATA REVIEWED:  BMET    Component Value Date/Time   NA 141 10/07/2022  0805   K 4.7 10/07/2022 0805   CL 108 (H) 10/07/2022 0805   CO2 18 (L) 10/07/2022 0805   GLUCOSE 108 (H) 10/07/2022 0805   GLUCOSE 96 08/29/2019 0549   BUN 16 10/07/2022 0805   CREATININE 0.93 10/07/2022 0805   CALCIUM  9.3 10/07/2022 0805   GFRNONAA 84 05/27/2020 0938   GFRAA 96 05/27/2020 0938   Lab Results  Component Value Date   HGBA1C 5.7 (H) 02/01/2024   HGBA1C 6.1 (H) 06/19/2019   Lab Results  Component Value Date   INSULIN  18.0 10/07/2022   INSULIN  13.7 07/02/2019   Lab Results  Component Value Date   TSH 2.170 02/01/2024   CBC    Component Value Date/Time   WBC 7.6 02/01/2024 0935   WBC 12.1 (H) 08/29/2019 0549   RBC 4.90 02/01/2024 0935   RBC 4.54 08/29/2019 0549   HGB 14.6 02/01/2024 0935   HCT 44.9 02/01/2024 0935   PLT 212 02/01/2024 0935   MCV 92 02/01/2024 0935   MCH 29.8 02/01/2024 0935   MCH 30.0 08/29/2019 0549   MCHC 32.5 02/01/2024 0935   MCHC 32.9 08/29/2019 0549   RDW 13.0 02/01/2024 0935   Iron Studies No results found for: IRON, TIBC, FERRITIN, IRONPCTSAT Lipid Panel     Component Value Date/Time   CHOL 198 10/10/2023 0855   TRIG 137 10/10/2023 0855   HDL 44 10/10/2023 0855   CHOLHDL 4.0 02/18/2022 1132   LDLCALC 129 (H) 10/10/2023 0855   Hepatic Function Panel     Component Value Date/Time   PROT 7.0 10/07/2022 0805   ALBUMIN 4.4 10/07/2022 0805   AST 17 10/07/2022 0805   ALT 16 10/07/2022 0805   ALKPHOS 71 10/07/2022 0805   BILITOT 0.3 10/07/2022 0805      Component Value Date/Time   TSH 2.170 02/01/2024 0935   Nutritional Lab Results  Component Value Date   VD25OH 49.1 10/07/2022   VD25OH 46.3 02/18/2022   VD25OH 48.0 11/02/2021     ASSESSMENT AND PLAN  TREATMENT PLAN FOR OBESITY:  Recommended Dietary Goals  Emelin is currently in the action stage of change. As such, her goal is to continue weight management plan. She has agreed to keeping a food journal and adhering to recommended goals of 1500  calories and 100 + grams of protein.  Behavioral Intervention  We discussed the following Behavioral Modification Strategies today: increasing lean protein intake to established goals, decreasing simple carbohydrates , increasing vegetables, increasing fiber rich foods, increasing water intake , reading food labels , keeping healthy foods at home, planning for success, better snacking choices, celebration eating strategies, continue to work on maintaining a reduced calorie state, getting the recommended amount of protein, incorporating whole foods, making healthy choices,  staying well hydrated and practicing mindfulness when eating., and increase protein intake, fibrous foods (25 grams per day for women, 30 grams for men) and water to improve satiety and decrease hunger signals. .  Additional resources provided today: NA  Recommended Physical Activity Goals  Mescal has been advised to work up to 150 minutes of moderate intensity aerobic activity a week and strengthening exercises 2-3 times per week for cardiovascular health, weight loss maintenance and preservation of muscle mass.   She has agreed to Unable to participate in physical activity at present due to medical conditions    Pharmacotherapy We discussed various medication options to help Inocente with her weight loss efforts and we both agreed to restart Zepbound  2.5mg .  Side effects discussed.  She has 4 doses of Zepbound  2.5mg  at home.  Will then start Wegovy -would benefit from due to history of HTN, CVA, OSAS, prediabetes.  See Select study.    ASSOCIATED CONDITIONS ADDRESSED TODAY  Action/Plan  Prediabetes -     Wegovy ; Inject 0.25 mg into the skin once a week.  Dispense: 2 mL; Refill: 0 Continue Metformin  500mg  BID  Nikitta will continue to work on weight loss, exercise, and decreasing simple carbohydrates to help decrease the risk of diabetes.    OSA (obstructive sleep apnea) -     Wegovy ; Inject 0.25 mg into the skin once a week.   Dispense: 2 mL; Refill: 0  Continue wearing dental device nightly.   Cerebrovascular accident (CVA), unspecified mechanism (HCC) -     Wegovy ; Inject 0.25 mg into the skin once a week.  Dispense: 2 mL; Refill: 0  Continue to follow up with neurology.   Leukocytosis, unspecified type Patient doesn't want to proceed with a referral at this time.  Last WBCs were wnl  Obesity, Class I, BMI 30-34.9 -     Wegovy ; Inject 0.25 mg into the skin once a week.  Dispense: 2 mL; Refill: 0         Return in about 4 weeks (around 04/30/2024).SABRA She was informed of the importance of frequent follow up visits to maximize her success with intensive lifestyle modifications for her multiple health conditions.   ATTESTASTION STATEMENTS:  Reviewed by clinician on day of visit: allergies, medications, problem list, medical history, surgical history, family history, social history, and previous encounter notes.   Corean SAUNDERS. Reginal Wojcicki FNP-C

## 2024-04-02 NOTE — Patient Instructions (Signed)

## 2024-04-24 ENCOUNTER — Ambulatory Visit (INDEPENDENT_AMBULATORY_CARE_PROVIDER_SITE_OTHER): Admitting: Nurse Practitioner

## 2024-04-24 ENCOUNTER — Encounter: Payer: Self-pay | Admitting: Nurse Practitioner

## 2024-04-24 VITALS — BP 116/81 | HR 89 | Temp 97.9°F | Ht 67.0 in | Wt 216.0 lb

## 2024-04-24 DIAGNOSIS — E66811 Obesity, class 1: Secondary | ICD-10-CM | POA: Diagnosis not present

## 2024-04-24 DIAGNOSIS — I639 Cerebral infarction, unspecified: Secondary | ICD-10-CM | POA: Diagnosis not present

## 2024-04-24 DIAGNOSIS — I10 Essential (primary) hypertension: Secondary | ICD-10-CM

## 2024-04-24 DIAGNOSIS — R7303 Prediabetes: Secondary | ICD-10-CM

## 2024-04-24 DIAGNOSIS — G4733 Obstructive sleep apnea (adult) (pediatric): Secondary | ICD-10-CM | POA: Diagnosis not present

## 2024-04-24 DIAGNOSIS — Z6833 Body mass index (BMI) 33.0-33.9, adult: Secondary | ICD-10-CM | POA: Diagnosis not present

## 2024-04-24 MED ORDER — WEGOVY 0.25 MG/0.5ML ~~LOC~~ SOAJ: 0.2500 mg | SUBCUTANEOUS | 0 refills | Status: AC

## 2024-04-24 NOTE — Progress Notes (Signed)
 "  Office: [phone]  /  Fax: [phone]  WEIGHT SUMMARY AND BIOMETRICS  Weight Lost Since Last Visit: 2lb  Weight Gained Since Last Visit: 0lb   Vitals Temp: 97.9 F (36.6 C) BP: 116/81 Pulse Rate: 89 SpO2: 97 %   Anthropometric Measurements Height: 5' 7 (1.702 m) Weight: 216 lb (98 kg) BMI (Calculated): 33.82 Weight at Last Visit: 218lb Weight Lost Since Last Visit: 2lb Weight Gained Since Last Visit: 0lb Starting Weight: 242lb Total Weight Loss (lbs): 22 lb (9.979 kg)   Body Composition  Body Fat %: 39.5 % Fat Mass (lbs): 85.6 lbs Muscle Mass (lbs): 124.4 lbs Total Body Water (lbs): 80.8 lbs Visceral Fat Rating : 10   Other Clinical Data Fasting: Yes Labs: No Today's Visit #: 52 Starting Date: 07/02/19     HPI  Chief Complaint: OBESITY  Shelley Ryan is here to discuss her progress with her obesity treatment plan. She is on the keeping a food journal and adhering to recommended goals of 1500 calories and 120 protein and states she is following her eating plan approximately 50-75 % of the time. She states she is exercising 0 minutes 0 days per week.   Interval History:  Shelley Ryan has a PMH of HTN, migraines, IIH, CVA, OSAS, GERD, prediabetes, HLD and Vit D def.    Since last office visit she has lost 2 pounds.  She celebrated the holidays since her last visit. She is aiming to meet her protein goals. When she meets her protein goals, she finds she doesn't crave and snack as much especially in the evenings.  She is snacking on yogurt or a yogurt smoothie daily.  She has been cooking more at home.  She is drinking water and coffee with collagen or fairlife daily.    Pharmacotherapy for weight loss: She is currently taking Zepbound  2.5mg  (self pay) for medical weight loss. Denies side effects.     Previous pharmacotherapy for medical weight loss:   -Wegovy  (denied side effects while taking) -Contrave  (didn't feel it was beneficial)  -Saxenda -didn't have  worsening of GERD  -She stopped Phentermine due to not being effective.    Bariatric surgery:   Not able to proceed with bariatric surgery per GI  Hypertension Hypertension well controlled.  Medication(s): none-improved with weight loss.  Took Lisinopril  in the past.   Denies chest pain, palpitations and SOB.  BP Readings from Last 3 Encounters:  04/24/24 116/81  04/02/24 132/79  02/01/24 120/76   Lab Results  Component Value Date   CREATININE 0.93 10/07/2022   CREATININE 0.92 05/24/2022   CREATININE 0.85 02/18/2022      Obstructive Sleep Apnea Shelley Ryan has a diagnosis of sleep apnea. She reports that she is using a dental device regularly.  History of CVA She is seeing neurology on a regular basis. She is taking Plavix daily.   Prediabetes Last A1c was 5.7  Medication(s): Zepbound  2.5 mg SQ weekly and Metformin  500mg  BID.  Denies side effects discussed.  Polyphagia:Yes Lab Results  Component Value Date   HGBA1C 5.7 (H) 02/01/2024   HGBA1C 5.9 (H) 10/07/2022   HGBA1C 5.9 (H) 05/24/2022   HGBA1C 6.0 (H) 02/18/2022   HGBA1C 6.0 (H) 11/02/2021   Lab Results  Component Value Date   INSULIN  18.0 10/07/2022   INSULIN  13.4 05/24/2022   INSULIN  15.8 02/18/2022   INSULIN  9.4 11/02/2021   INSULIN  18.2 06/25/2021    PHYSICAL EXAM:  Blood pressure 116/81, pulse 89, temperature 97.9 F (36.6 C), height 5' 7 (  1.702 m), weight 216 lb (98 kg), SpO2 97%. Body mass index is 33.83 kg/m.  General: She is overweight, cooperative, alert, well developed, and in no acute distress. PSYCH: Has normal mood, affect and thought process.   Extremities: No edema.  Neurologic: No gross sensory or motor deficits. No tremors or fasciculations noted.    DIAGNOSTIC DATA REVIEWED:  BMET    Component Value Date/Time   NA 141 10/07/2022 0805   K 4.7 10/07/2022 0805   CL 108 (H) 10/07/2022 0805   CO2 18 (L) 10/07/2022 0805   GLUCOSE 108 (H) 10/07/2022 0805   GLUCOSE 96 08/29/2019 0549    BUN 16 10/07/2022 0805   CREATININE 0.93 10/07/2022 0805   CALCIUM  9.3 10/07/2022 0805   GFRNONAA 84 05/27/2020 0938   GFRAA 96 05/27/2020 0938   Lab Results  Component Value Date   HGBA1C 5.7 (H) 02/01/2024   HGBA1C 6.1 (H) 06/19/2019   Lab Results  Component Value Date   INSULIN  18.0 10/07/2022   INSULIN  13.7 07/02/2019   Lab Results  Component Value Date   TSH 2.170 02/01/2024   CBC    Component Value Date/Time   WBC 7.6 02/01/2024 0935   WBC 12.1 (H) 08/29/2019 0549   RBC 4.90 02/01/2024 0935   RBC 4.54 08/29/2019 0549   HGB 14.6 02/01/2024 0935   HCT 44.9 02/01/2024 0935   PLT 212 02/01/2024 0935   MCV 92 02/01/2024 0935   MCH 29.8 02/01/2024 0935   MCH 30.0 08/29/2019 0549   MCHC 32.5 02/01/2024 0935   MCHC 32.9 08/29/2019 0549   RDW 13.0 02/01/2024 0935   Iron Studies No results found for: IRON, TIBC, FERRITIN, IRONPCTSAT Lipid Panel     Component Value Date/Time   CHOL 198 10/10/2023 0855   TRIG 137 10/10/2023 0855   HDL 44 10/10/2023 0855   CHOLHDL 4.0 02/18/2022 1132   LDLCALC 129 (H) 10/10/2023 0855   Hepatic Function Panel     Component Value Date/Time   PROT 7.0 10/07/2022 0805   ALBUMIN 4.4 10/07/2022 0805   AST 17 10/07/2022 0805   ALT 16 10/07/2022 0805   ALKPHOS 71 10/07/2022 0805   BILITOT 0.3 10/07/2022 0805      Component Value Date/Time   TSH 2.170 02/01/2024 0935   Nutritional Lab Results  Component Value Date   VD25OH 49.1 10/07/2022   VD25OH 46.3 02/18/2022   VD25OH 48.0 11/02/2021     ASSESSMENT AND PLAN  TREATMENT PLAN FOR OBESITY:  Recommended Dietary Goals  Shelley Ryan is currently in the action stage of change. As such, her goal is to continue weight management plan. She has agreed to keeping a food journal and adhering to recommended goals of 1500 calories and 80+ grams of protein.  Behavioral Intervention  We discussed the following Behavioral Modification Strategies today: increasing lean protein  intake to established goals, decreasing simple carbohydrates , increasing vegetables, increasing fiber rich foods, increasing water intake , work on meal planning and preparation, work on tracking and journaling calories using tracking application, reading food labels , keeping healthy foods at home, planning for success, celebration eating strategies, continue to work on maintaining a reduced calorie state, getting the recommended amount of protein, incorporating whole foods, making healthy choices, staying well hydrated and practicing mindfulness when eating., and increase protein intake, fibrous foods (25 grams per day for women, 30 grams for men) and water to improve satiety and decrease hunger signals. .  Additional resources provided today: NA  Recommended  Physical Activity Goals  Munira has been advised to work up to 150 minutes of moderate intensity aerobic activity a week and strengthening exercises 2-3 times per week for cardiovascular health, weight loss maintenance and preservation of muscle mass.   She has agreed to Think about enjoyable ways to increase daily physical activity and overcoming barriers to exercise, Increase physical activity in their day and reduce sedentary time (increase NEAT)., Increase volume of physical activity to a goal of 240 minutes a week, and Combine aerobic and strengthening exercises for efficiency and improved cardiometabolic health.   Pharmacotherapy We discussed various medication options to help Inocente with her weight loss efforts and we both agreed to continue Zepbound  2.5mg  x 2 more doses and then will switch over to Wegovy  0.25mg  (she has one dose at home).  She would benefit from taking Wegovy  due to history of HTN, CVA, OSAS, prediabetes, IIH. See Select study.   ASSOCIATED CONDITIONS ADDRESSED TODAY  Action/Plan  Cerebrovascular accident (CVA), unspecified mechanism (HCC) -     Wegovy ; Inject 0.25 mg into the skin once a week.  Dispense: 2 mL;  Refill: 0  Essential hypertension -     Wegovy ; Inject 0.25 mg into the skin once a week.  Dispense: 2 mL; Refill: 0  OSA (obstructive sleep apnea) -     Wegovy ; Inject 0.25 mg into the skin once a week.  Dispense: 2 mL; Refill: 0  Prediabetes -     Wegovy ; Inject 0.25 mg into the skin once a week.  Dispense: 2 mL; Refill: 0  Obesity, Class I, BMI 30-34.9 -     Wegovy ; Inject 0.25 mg into the skin once a week.  Dispense: 2 mL; Refill: 0      Bio Impedance reviewed with the patient.     Return in about 4 weeks (around 05/22/2024).SABRA She was informed of the importance of frequent follow up visits to maximize her success with intensive lifestyle modifications for her multiple health conditions.   ATTESTASTION STATEMENTS:  Reviewed by clinician on day of visit: allergies, medications, problem list, medical history, surgical history, family history, social history, and previous encounter notes.    Shelley Ryan SAUNDERS. Cristel Rail FNP-C "

## 2024-04-27 ENCOUNTER — Other Ambulatory Visit: Payer: Self-pay | Admitting: Nurse Practitioner

## 2024-04-27 DIAGNOSIS — R7303 Prediabetes: Secondary | ICD-10-CM

## 2024-04-30 ENCOUNTER — Other Ambulatory Visit: Payer: Self-pay | Admitting: Nurse Practitioner

## 2024-04-30 DIAGNOSIS — R7303 Prediabetes: Secondary | ICD-10-CM

## 2024-04-30 MED ORDER — METFORMIN HCL 500 MG PO TABS: 500.0000 mg | ORAL_TABLET | Freq: Two times a day (BID) | ORAL | 0 refills | Status: DC

## 2024-05-11 ENCOUNTER — Encounter: Payer: Self-pay | Admitting: Nurse Practitioner

## 2024-05-21 ENCOUNTER — Ambulatory Visit: Admitting: Nurse Practitioner

## 2024-05-23 ENCOUNTER — Ambulatory Visit: Admitting: Nurse Practitioner

## 2024-05-23 ENCOUNTER — Encounter: Payer: Self-pay | Admitting: Nurse Practitioner

## 2024-05-23 VITALS — BP 124/84 | HR 88 | Temp 98.6°F | Ht 67.0 in | Wt 217.0 lb

## 2024-05-23 DIAGNOSIS — Z6833 Body mass index (BMI) 33.0-33.9, adult: Secondary | ICD-10-CM

## 2024-05-23 DIAGNOSIS — E66811 Obesity, class 1: Secondary | ICD-10-CM | POA: Diagnosis not present

## 2024-05-23 DIAGNOSIS — I10 Essential (primary) hypertension: Secondary | ICD-10-CM | POA: Diagnosis not present

## 2024-05-23 DIAGNOSIS — G4733 Obstructive sleep apnea (adult) (pediatric): Secondary | ICD-10-CM

## 2024-05-23 DIAGNOSIS — R7303 Prediabetes: Secondary | ICD-10-CM | POA: Diagnosis not present

## 2024-05-23 DIAGNOSIS — I639 Cerebral infarction, unspecified: Secondary | ICD-10-CM

## 2024-05-23 MED ORDER — METFORMIN HCL 500 MG PO TABS: 500.0000 mg | ORAL_TABLET | Freq: Two times a day (BID) | ORAL | 0 refills | Status: AC

## 2024-05-23 NOTE — Progress Notes (Signed)
 "  Office: [phone]  /  Fax: [phone]  WEIGHT SUMMARY AND BIOMETRICS  Weight Lost Since Last Visit: 0lb  Weight Gained Since Last Visit: 1lb   Vitals Temp: 98.6 F (37 C) BP: 124/84 Pulse Rate: 88 SpO2: 97 %   Anthropometric Measurements Height: 5' 7 (1.702 m) Weight: 217 lb (98.4 kg) BMI (Calculated): 33.98 Weight at Last Visit: 216lb Weight Lost Since Last Visit: 0lb Weight Gained Since Last Visit: 1lb Starting Weight: 242lb Total Weight Loss (lbs): 25 lb (11.3 kg)   Body Composition  Body Fat %: 40.2 % Fat Mass (lbs): 87.4 lbs Muscle Mass (lbs): 123.6 lbs Total Body Water (lbs): 81.6 lbs Visceral Fat Rating : 10   Other Clinical Data Fasting: Yes Labs: No Today's Visit #: 80 Starting Date: 07/02/19     HPI  Chief Complaint: OBESITY  Shelley Ryan is here to discuss her progress with her obesity treatment plan. She is on the keeping a food journal and adhering to recommended goals of 1500 calories and 120 protein and states she is following her eating plan approximately 80 % of the time. She states she is exercising 0 minutes 0 days per week.   Interval History:  Since last office visit she has gained 1 pound.  She is eating protein cereal with fairlife milk for breakfast, spinach salad with chicken, carrots and other vegetables and light ranch with a pear or a frozen meal for lunch and a protein with a vegetable for dinner.  She is drinking water, coffee with cream and a diet coke daily.  She is not exercising due to her schedule.  She is helping to care for her parents.  She is sleeping 7.5 hours nightly and is wearing her dental device for OSAS.    Pharmacotherapy for weight loss: She is not currently taking any medications for medical weight loss. She recently picked up Wegovy  0.25mg  from the pharmacy and is planning to start this week.  She stopped taking Zepbound  2 weeks ago.     Previous pharmacotherapy for medical weight loss:   -Wegovy  (denied  side effects while taking) -Contrave  (didn't feel it was beneficial)  -Saxenda -didn't have worsening of GERD  -She stopped Phentermine due to not being effective.    Bariatric surgery:   Not able to proceed with bariatric surgery per GI  Obstructive Sleep Apnea Jaina has a diagnosis of sleep apnea. She reports that she is using her dental device regularly. She has a diagnosis of moderate OSAS based upon an AHI of 18.8/hour with a minimum O2 saturation of 80% on 02/21/2022.    History of CVA She is seeing neurology on a regular basis. She is taking Plavix daily.   Prediabetes Last A1c was 5.7  Medication(s): Metformin  500mg  BID.  Denies side effects.  Polyphagia:Yes Lab Results  Component Value Date   HGBA1C 5.7 (H) 02/01/2024   HGBA1C 5.9 (H) 10/07/2022   HGBA1C 5.9 (H) 05/24/2022   HGBA1C 6.0 (H) 02/18/2022   HGBA1C 6.0 (H) 11/02/2021   Lab Results  Component Value Date   INSULIN  18.0 10/07/2022   INSULIN  13.4 05/24/2022   INSULIN  15.8 02/18/2022   INSULIN  9.4 11/02/2021   INSULIN  18.2 06/25/2021     Hypertension Hypertension stable.  Medication(s): none-improved with weight loss. Took Lisinopril  in the past.   Denies chest pain, palpitations and SOB.  BP Readings from Last 3 Encounters:  05/23/24 124/84  04/24/24 116/81  04/02/24 132/79   Lab Results  Component Value Date  CREATININE 0.93 10/07/2022   CREATININE 0.92 05/24/2022   CREATININE 0.85 02/18/2022        PHYSICAL EXAM:  Blood pressure 124/84, pulse 88, temperature 98.6 F (37 C), height 5' 7 (1.702 m), weight 217 lb (98.4 kg), SpO2 97%. Body mass index is 33.99 kg/m.  General: She is overweight, cooperative, alert, well developed, and in no acute distress. PSYCH: Has normal mood, affect and thought process.   Extremities: No edema.  Neurologic: No gross sensory or motor deficits. No tremors or fasciculations noted.    DIAGNOSTIC DATA REVIEWED:  BMET    Component Value Date/Time   NA  141 10/07/2022 0805   K 4.7 10/07/2022 0805   CL 108 (H) 10/07/2022 0805   CO2 18 (L) 10/07/2022 0805   GLUCOSE 108 (H) 10/07/2022 0805   GLUCOSE 96 08/29/2019 0549   BUN 16 10/07/2022 0805   CREATININE 0.93 10/07/2022 0805   CALCIUM  9.3 10/07/2022 0805   GFRNONAA 84 05/27/2020 0938   GFRAA 96 05/27/2020 0938   Lab Results  Component Value Date   HGBA1C 5.7 (H) 02/01/2024   HGBA1C 6.1 (H) 06/19/2019   Lab Results  Component Value Date   INSULIN  18.0 10/07/2022   INSULIN  13.7 07/02/2019   Lab Results  Component Value Date   TSH 2.170 02/01/2024   CBC    Component Value Date/Time   WBC 7.6 02/01/2024 0935   WBC 12.1 (H) 08/29/2019 0549   RBC 4.90 02/01/2024 0935   RBC 4.54 08/29/2019 0549   HGB 14.6 02/01/2024 0935   HCT 44.9 02/01/2024 0935   PLT 212 02/01/2024 0935   MCV 92 02/01/2024 0935   MCH 29.8 02/01/2024 0935   MCH 30.0 08/29/2019 0549   MCHC 32.5 02/01/2024 0935   MCHC 32.9 08/29/2019 0549   RDW 13.0 02/01/2024 0935   Iron Studies No results found for: IRON, TIBC, FERRITIN, IRONPCTSAT Lipid Panel     Component Value Date/Time   CHOL 198 10/10/2023 0855   TRIG 137 10/10/2023 0855   HDL 44 10/10/2023 0855   CHOLHDL 4.0 02/18/2022 1132   LDLCALC 129 (H) 10/10/2023 0855   Hepatic Function Panel     Component Value Date/Time   PROT 7.0 10/07/2022 0805   ALBUMIN 4.4 10/07/2022 0805   AST 17 10/07/2022 0805   ALT 16 10/07/2022 0805   ALKPHOS 71 10/07/2022 0805   BILITOT 0.3 10/07/2022 0805      Component Value Date/Time   TSH 2.170 02/01/2024 0935   Nutritional Lab Results  Component Value Date   VD25OH 49.1 10/07/2022   VD25OH 46.3 02/18/2022   VD25OH 48.0 11/02/2021     ASSESSMENT AND PLAN  TREATMENT PLAN FOR OBESITY:  Recommended Dietary Goals  Shelley Ryan is currently in the action stage of change. As such, her goal is to continue weight management plan. She has agreed to keeping a food journal and adhering to recommended  goals of 1300-1400 calories and 80-110 protein.  Behavioral Intervention  We discussed the following Behavioral Modification Strategies today: increasing water intake , work on meal planning and preparation, work on tracking and journaling calories using tracking application, planning for success, continue to work on maintaining a reduced calorie state, getting the recommended amount of protein, incorporating whole foods, making healthy choices, staying well hydrated and practicing mindfulness when eating., and increase protein intake, fibrous foods (25 grams per day for women, 30 grams for men) and water to improve satiety and decrease hunger signals. .  Additional resources  provided today: NA  Recommended Physical Activity Goals  Tyreka has been advised to work up to 150 minutes of moderate intensity aerobic activity a week and strengthening exercises 2-3 times per week for cardiovascular health, weight loss maintenance and preservation of muscle mass.   She has agreed to Think about enjoyable ways to increase daily physical activity and overcoming barriers to exercise, Increase physical activity in their day and reduce sedentary time (increase NEAT)., Work on scheduling and tracking physical activity. , and Combine aerobic and strengthening exercises for efficiency and improved cardiometabolic health.   Pharmacotherapy We discussed various medication options to help Inocente with her weight loss efforts and we both agreed to start Wegovy  0.25mg . Side effects discussed.  She would benefit from taking Wegovy  due to history of HTN, CVA, OSAS, prediabetes, IIH. See Select study   ASSOCIATED CONDITIONS ADDRESSED TODAY  Action/Plan  OSA (obstructive sleep apnea) Continue dental device nightly  Cerebrovascular accident (CVA), unspecified mechanism (HCC) Continue to follow up with neurology. Take meds as directed  Essential hypertension Improved with weight loss.  Will continue to  monitor  Prediabetes -    Continue  metFORMIN  HCl; Take 1 tablet (500 mg total) by mouth 2 (two) times daily.  Dispense: 180 tablet; Refill: 0. Side effects discussed  Obesity, Class I, BMI 30-34.9         Return in about 4 weeks (around 06/20/2024).SABRA She was informed of the importance of frequent follow up visits to maximize her success with intensive lifestyle modifications for her multiple health conditions.   ATTESTASTION STATEMENTS:  Reviewed by clinician on day of visit: allergies, medications, problem list, medical history, surgical history, family history, social history, and previous encounter notes.     Corean SAUNDERS. Dametrius Sanjuan FNP-C "

## 2024-05-27 ENCOUNTER — Other Ambulatory Visit: Payer: Self-pay | Admitting: Nurse Practitioner

## 2024-05-27 DIAGNOSIS — E559 Vitamin D deficiency, unspecified: Secondary | ICD-10-CM

## 2024-06-12 ENCOUNTER — Ambulatory Visit: Admitting: Nurse Practitioner
